# Patient Record
Sex: Male | Born: 1950 | Race: White | Hispanic: No | Marital: Single | Smoking: Former smoker
Health system: Southern US, Community
[De-identification: ages and names within clinical notes are randomized; demographics above are authoritative.]

## PROBLEM LIST (undated history)

## (undated) DIAGNOSIS — J449 Chronic obstructive pulmonary disease, unspecified: Secondary | ICD-10-CM

## (undated) DIAGNOSIS — M810 Age-related osteoporosis without current pathological fracture: Secondary | ICD-10-CM

## (undated) DIAGNOSIS — J9601 Acute respiratory failure with hypoxia: Secondary | ICD-10-CM

## (undated) DIAGNOSIS — J9602 Acute respiratory failure with hypercapnia: Secondary | ICD-10-CM

## (undated) DIAGNOSIS — J45909 Unspecified asthma, uncomplicated: Secondary | ICD-10-CM

## (undated) HISTORY — DX: Acute respiratory failure with hypercapnia: J96.02

## (undated) HISTORY — PX: BACK SURGERY: SHX140

## (undated) HISTORY — DX: Acute respiratory failure with hypoxia: J96.01

---

## 2004-05-27 DIAGNOSIS — F411 Generalized anxiety disorder: Secondary | ICD-10-CM

## 2004-05-27 DIAGNOSIS — J439 Emphysema, unspecified: Secondary | ICD-10-CM

## 2004-06-01 DIAGNOSIS — Z720 Tobacco use: Secondary | ICD-10-CM | POA: Insufficient documentation

## 2004-06-01 DIAGNOSIS — K210 Gastro-esophageal reflux disease with esophagitis: Secondary | ICD-10-CM

## 2005-01-20 DIAGNOSIS — F329 Major depressive disorder, single episode, unspecified: Secondary | ICD-10-CM | POA: Insufficient documentation

## 2008-01-15 ENCOUNTER — Other Ambulatory Visit: Payer: Self-pay

## 2008-01-15 ENCOUNTER — Inpatient Hospital Stay: Payer: Self-pay | Admitting: Internal Medicine

## 2008-02-14 ENCOUNTER — Ambulatory Visit: Payer: Self-pay | Admitting: Internal Medicine

## 2008-04-14 ENCOUNTER — Ambulatory Visit: Payer: Self-pay | Admitting: Unknown Physician Specialty

## 2008-04-28 ENCOUNTER — Emergency Department: Payer: Self-pay | Admitting: Emergency Medicine

## 2008-04-28 ENCOUNTER — Other Ambulatory Visit: Payer: Self-pay

## 2008-04-30 ENCOUNTER — Emergency Department: Payer: Self-pay | Admitting: Emergency Medicine

## 2008-07-06 ENCOUNTER — Other Ambulatory Visit: Payer: Self-pay

## 2008-07-06 ENCOUNTER — Inpatient Hospital Stay: Payer: Self-pay | Admitting: Specialist

## 2008-07-14 ENCOUNTER — Emergency Department: Payer: Self-pay | Admitting: Emergency Medicine

## 2008-08-04 IMAGING — CT CT CHEST W/ CM
1 of 2 series · 15 of 29 positions shown, 19 images · non-contrast
Comparison: none

REASON FOR EXAM: Shortness of breath, COPD
COMMENTS:

[Series 2: soft tissue · axial · 0.77mm/px · z∈[-360,-50]mm · 15 of 68 slices shown, 19 images]
[im 3/68  mediastinal]
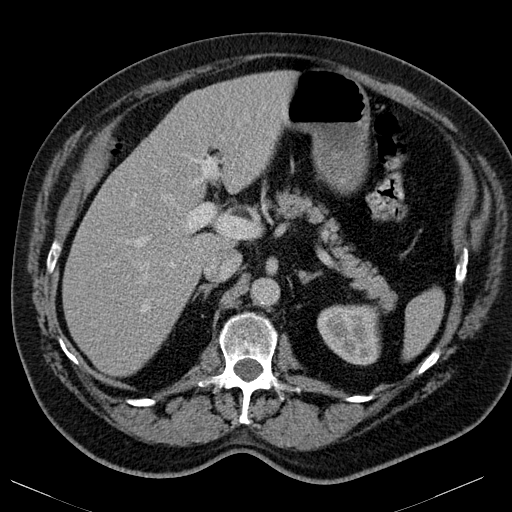
[im 3/68  lung]
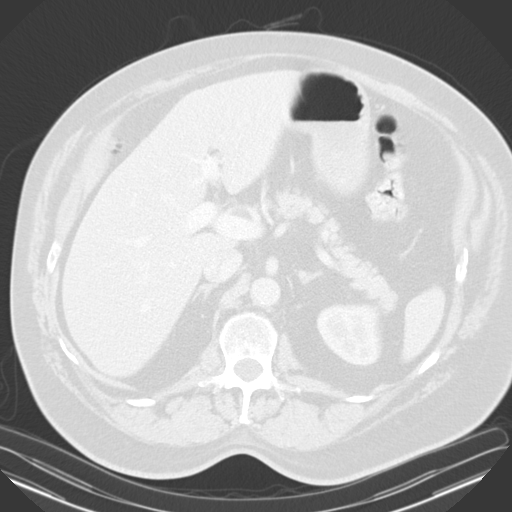
[im 9/68  lung]
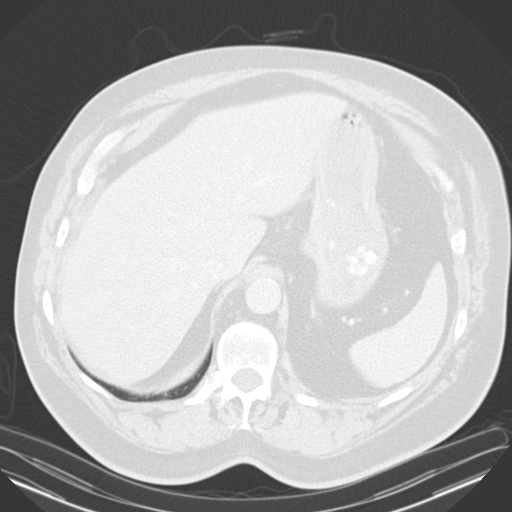
[im 14/68  lung]
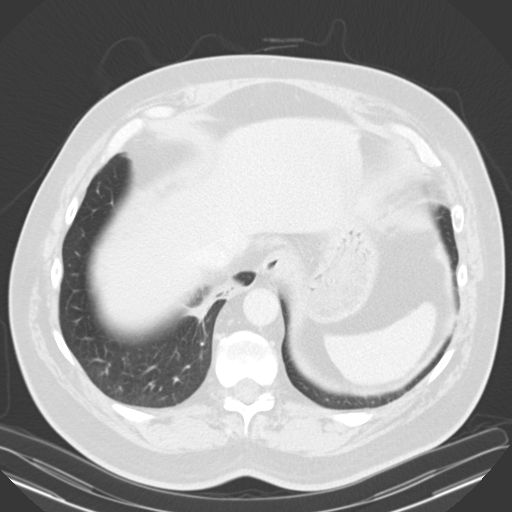
[im 17/68  lung]
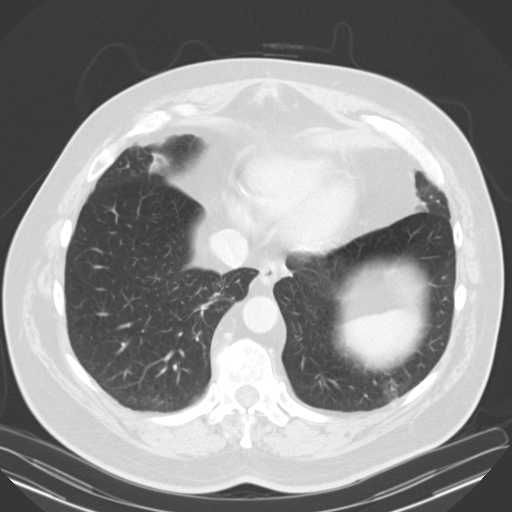
[im 23/68  mediastinal]
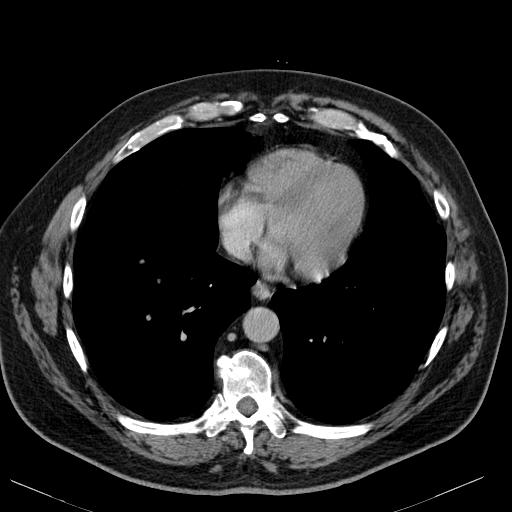
[im 23/68  lung]
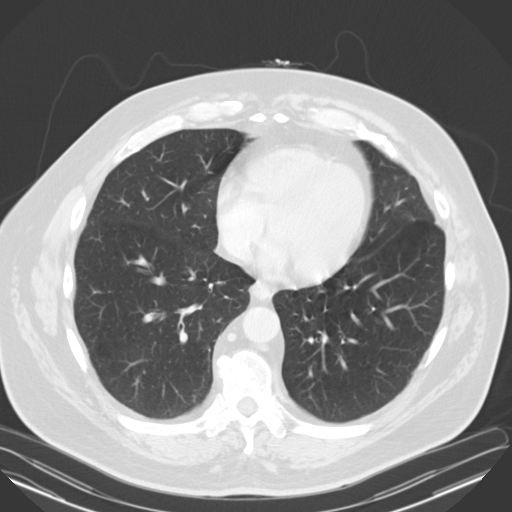
[im 26/68  lung]
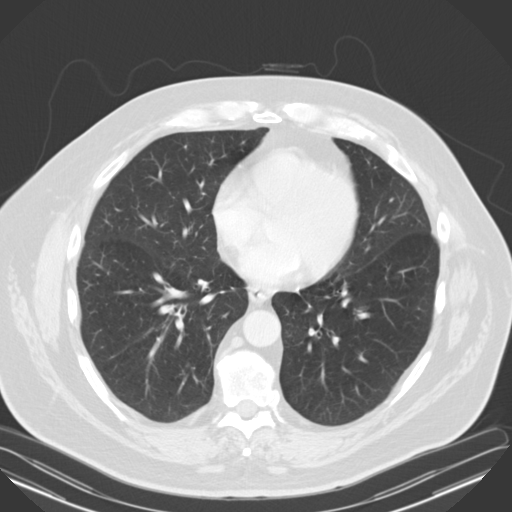
[im 30/68  lung]
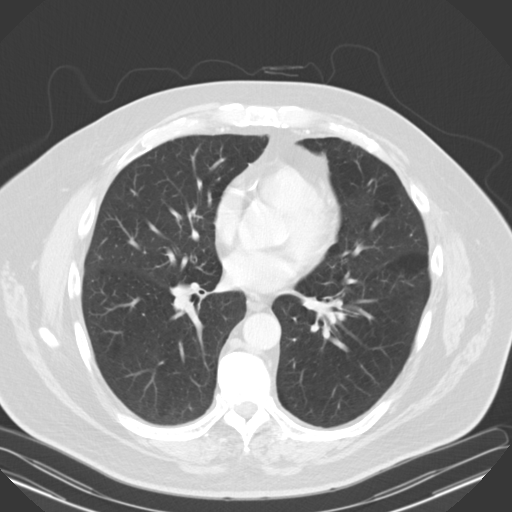
[im 34/68  lung]
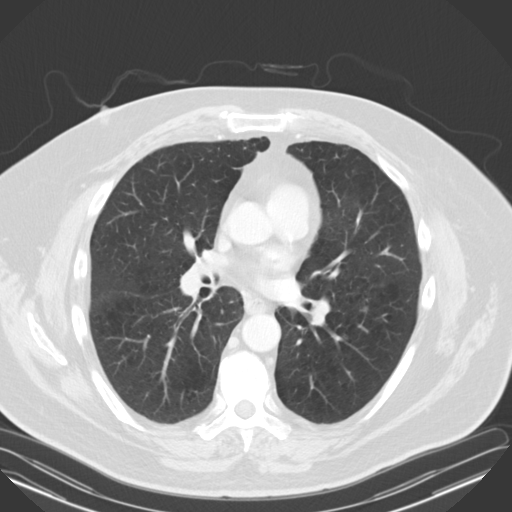
[im 37/68  mediastinal]
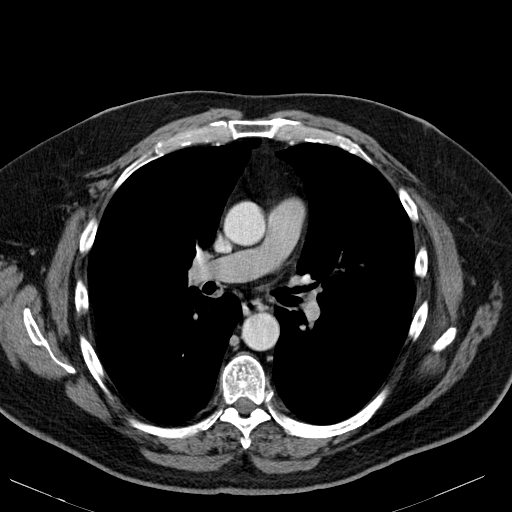
[im 37/68  lung]
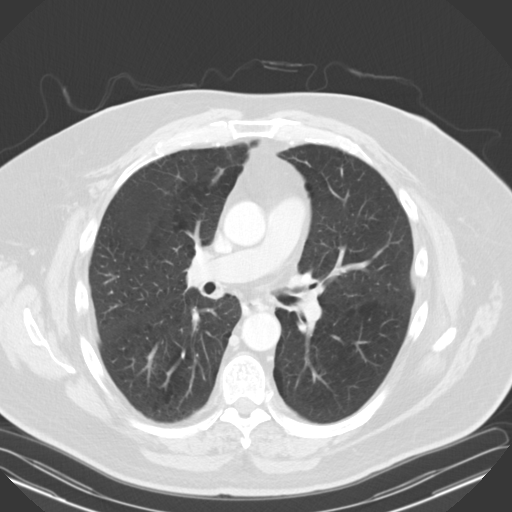
[im 42/68  lung]
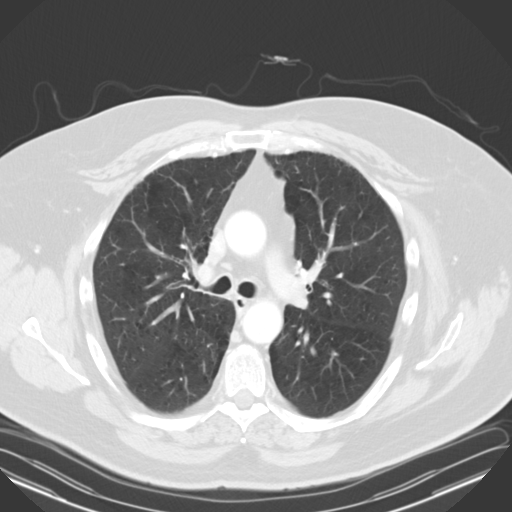
[im 45/68  lung]
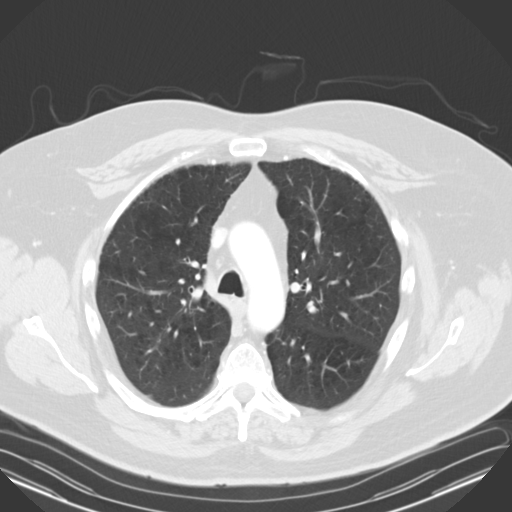
[im 51/68  lung]
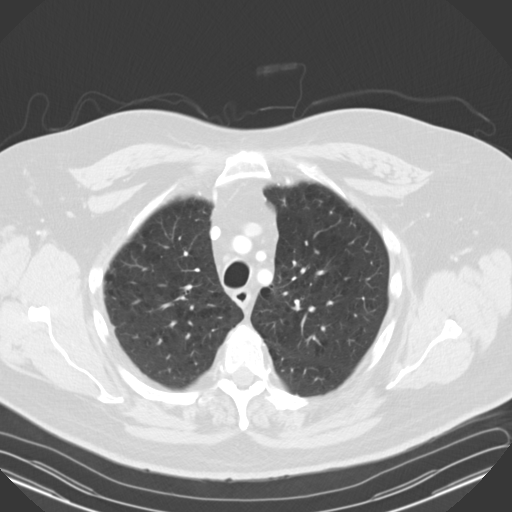
[im 56/68  mediastinal]
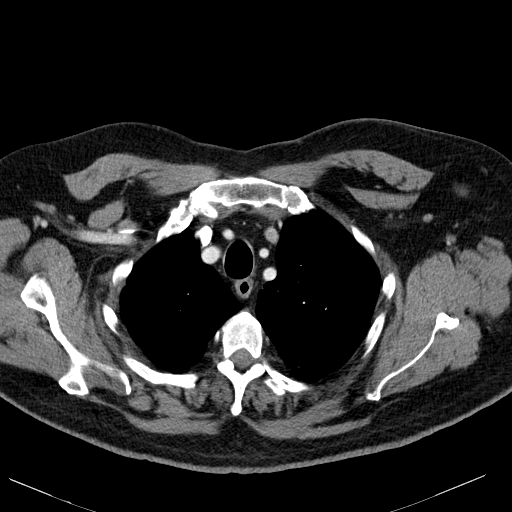
[im 56/68  lung]
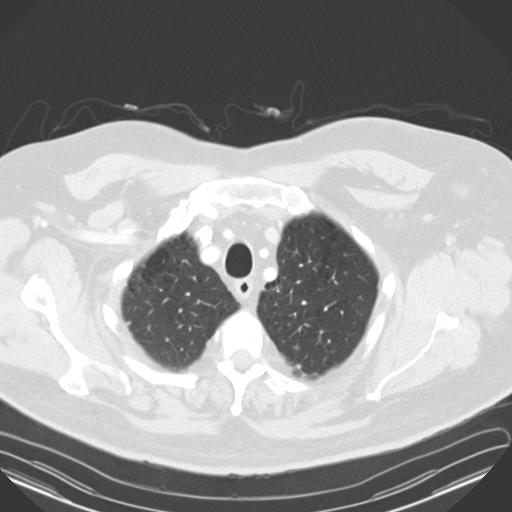
[im 59/68  lung]
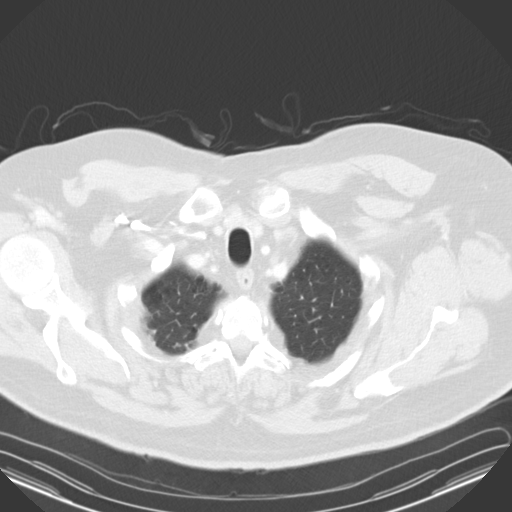
[im 65/68  lung]
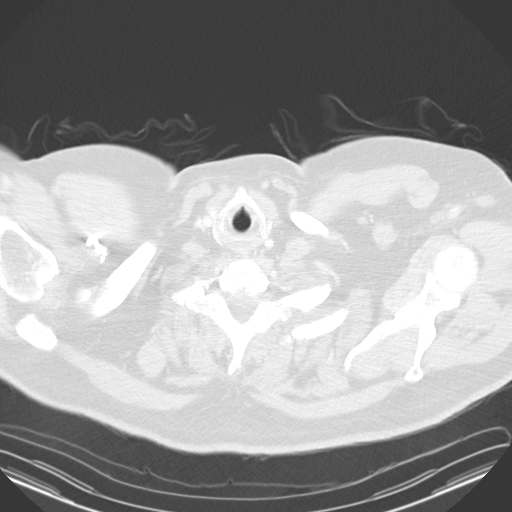

[15 of 29 positions shown; findings below may reference images not displayed]

PROCEDURE:     FI - FI CHEST WITH CONTRAST  - February 14, 2008  [DATE]

RESULT:     The patient is being evaluated for dyspnea. The patient has
known COPD.

The patient received 75 ml of [id number].

The cardiac chambers are normal in size. The caliber of the thoracic aorta
is normal. The study was not tailored for pulmonary arterial tree
evaluation. In the central pulmonary arteries no definite filling defect is
seen but the bolus is not ideal for evaluating the peripheral pulmonary
arteries. No pathologic sized mediastinal or hilar lymph nodes are evident.
There is no pleural or pericardial effusion.

At lung window settings there are emphysematous changes bilaterally. The
area of cavitary parenchymal density quite medially in the lower portion of
the RIGHT lower lobe in a paravertebral location has become much less
conspicuous consistent with ongoing resolution. There is a small amount of
fibrotic appearing change in this region now, demonstrated best on images
#52-56.   There are no abnormal nodules and I see no alveolar or
interstitial infiltrates. Within the upper abdomen, the observed portions of
the liver are normal. There are no adrenal masses.
IMPRESSION: 1.  There has been interval improvement in the appearance of the parenchymal
consolidation and cavitation very medially in the RIGHT lower lobe since the
study [DATE]. There are also findings of COPD which appear stable.
2.  There is no evidence of mediastinal or hilar lymphadenopathy.
3.  There is no evidence of CHF or pleural or pericardial effusion.

## 2008-10-05 ENCOUNTER — Inpatient Hospital Stay: Payer: Self-pay | Admitting: Internal Medicine

## 2008-11-10 ENCOUNTER — Ambulatory Visit: Payer: Self-pay | Admitting: Unknown Physician Specialty

## 2013-07-12 DIAGNOSIS — Z79899 Other long term (current) drug therapy: Secondary | ICD-10-CM | POA: Insufficient documentation

## 2013-07-12 DIAGNOSIS — Z9114 Patient's other noncompliance with medication regimen: Secondary | ICD-10-CM | POA: Insufficient documentation

## 2013-07-12 DIAGNOSIS — Z91148 Patient's other noncompliance with medication regimen for other reason: Secondary | ICD-10-CM | POA: Insufficient documentation

## 2013-08-20 ENCOUNTER — Emergency Department: Payer: Self-pay | Admitting: Emergency Medicine

## 2015-06-26 ENCOUNTER — Emergency Department
Admission: EM | Admit: 2015-06-26 | Discharge: 2015-06-26 | Disposition: A | Discharge status: Home / Self Care | Payer: Medicare Other | Attending: Emergency Medicine | Admitting: Emergency Medicine

## 2015-06-26 ENCOUNTER — Encounter: Payer: Self-pay | Admitting: Emergency Medicine

## 2015-06-26 ENCOUNTER — Emergency Department: Payer: Medicare Other

## 2015-06-26 DIAGNOSIS — E034 Atrophy of thyroid (acquired): Secondary | ICD-10-CM | POA: Insufficient documentation

## 2015-06-26 DIAGNOSIS — M62830 Muscle spasm of back: Secondary | ICD-10-CM | POA: Insufficient documentation

## 2015-06-26 DIAGNOSIS — Y9289 Other specified places as the place of occurrence of the external cause: Secondary | ICD-10-CM | POA: Diagnosis not present

## 2015-06-26 DIAGNOSIS — S24109A Unspecified injury at unspecified level of thoracic spinal cord, initial encounter: Secondary | ICD-10-CM | POA: Insufficient documentation

## 2015-06-26 DIAGNOSIS — G8929 Other chronic pain: Secondary | ICD-10-CM | POA: Diagnosis not present

## 2015-06-26 DIAGNOSIS — S3992XA Unspecified injury of lower back, initial encounter: Secondary | ICD-10-CM | POA: Insufficient documentation

## 2015-06-26 DIAGNOSIS — IMO0002 Reserved for concepts with insufficient information to code with codable children: Secondary | ICD-10-CM | POA: Insufficient documentation

## 2015-06-26 DIAGNOSIS — Z720 Tobacco use: Secondary | ICD-10-CM | POA: Diagnosis not present

## 2015-06-26 DIAGNOSIS — X58XXXA Exposure to other specified factors, initial encounter: Secondary | ICD-10-CM | POA: Diagnosis not present

## 2015-06-26 DIAGNOSIS — M549 Dorsalgia, unspecified: Secondary | ICD-10-CM

## 2015-06-26 DIAGNOSIS — M810 Age-related osteoporosis without current pathological fracture: Secondary | ICD-10-CM | POA: Insufficient documentation

## 2015-06-26 DIAGNOSIS — Y9389 Activity, other specified: Secondary | ICD-10-CM | POA: Diagnosis not present

## 2015-06-26 DIAGNOSIS — Y998 Other external cause status: Secondary | ICD-10-CM | POA: Diagnosis not present

## 2015-06-26 DIAGNOSIS — M62838 Other muscle spasm: Secondary | ICD-10-CM

## 2015-06-26 HISTORY — DX: Age-related osteoporosis without current pathological fracture: M81.0

## 2015-06-26 HISTORY — DX: Chronic obstructive pulmonary disease, unspecified: J44.9

## 2015-06-26 MED ORDER — OXYCODONE HCL 5 MG PO TABS: 5.0000 mg | ORAL_TABLET | Freq: Four times a day (QID) | ORAL | Status: DC | PRN

## 2015-06-26 MED ORDER — OXYCODONE-ACETAMINOPHEN 5-325 MG PO TABS
1.0000 | ORAL_TABLET | Freq: Once | ORAL | Status: AC
  Administered 2015-06-26: 1 via ORAL
  Filled 2015-06-26: qty 1

## 2015-06-26 NOTE — ED Notes (Signed)
Patient transported to X-ray 

## 2015-06-26 NOTE — ED Notes (Signed)
Patient with no complaints at this time. Respirations even and unlabored. Skin warm/dry. Discharge instructions reviewed with patient at this time. Patient given opportunity to voice concerns/ask questions. Patient discharged at this time and left Emergency Department, via wheelchair.   

## 2015-06-26 NOTE — ED Notes (Signed)
Pt complains of pain to left side of back. Pt states he hit his back on metal rod attached to his oxygen tank in back seat of his car.

## 2015-06-26 NOTE — Discharge Instructions (Signed)
Back Pain, Adult Low back pain is very common. About 1 in 5 people have back pain.The cause of low back pain is rarely dangerous. The pain often gets better over time.About half of people with a sudden onset of back pain feel better in just 2 weeks. About 8 in 10 people feel better by 6 weeks.  CAUSES Some common causes of back pain include:  Strain of the muscles or ligaments supporting the spine.  Wear and tear (degeneration) of the spinal discs.  Arthritis.  Direct injury to the back. DIAGNOSIS Most of the time, the direct cause of low back pain is not known.However, back pain can be treated effectively even when the exact cause of the pain is unknown.Answering your caregiver's questions about your overall health and symptoms is one of the most accurate ways to make sure the cause of your pain is not dangerous. If your caregiver needs more information, he or she may order lab work or imaging tests (X-rays or MRIs).However, even if imaging tests show changes in your back, this usually does not require surgery. HOME CARE INSTRUCTIONS For many people, back pain returns.Since low back pain is rarely dangerous, it is often a condition that people can learn to manageon their own.   Remain active. It is stressful on the back to sit or stand in one place. Do not sit, drive, or stand in one place for more than 30 minutes at a time. Take short walks on level surfaces as soon as pain allows.Try to increase the length of time you walk each day.  Do not stay in bed.Resting more than 1 or 2 days can delay your recovery.  Do not avoid exercise or work.Your body is made to move.It is not dangerous to be active, even though your back may hurt.Your back will likely heal faster if you return to being active before your pain is gone.  Pay attention to your body when you bend and lift. Many people have less discomfortwhen lifting if they bend their knees, keep the load close to their bodies,and  avoid twisting. Often, the most comfortable positions are those that put less stress on your recovering back.  Find a comfortable position to sleep. Use a firm mattress and lie on your side with your knees slightly bent. If you lie on your back, put a pillow under your knees.  Only take over-the-counter or prescription medicines as directed by your caregiver. Over-the-counter medicines to reduce pain and inflammation are often the most helpful.Your caregiver may prescribe muscle relaxant drugs.These medicines help dull your pain so you can more quickly return to your normal activities and healthy exercise.  Put ice on the injured area.  Put ice in a plastic bag.  Place a towel between your skin and the bag.  Leave the ice on for 15-20 minutes, 03-04 times a day for the first 2 to 3 days. After that, ice and heat may be alternated to reduce pain and spasms.  Ask your caregiver about trying back exercises and gentle massage. This may be of some benefit.  Avoid feeling anxious or stressed.Stress increases muscle tension and can worsen back pain.It is important to recognize when you are anxious or stressed and learn ways to manage it.Exercise is a great option. SEEK MEDICAL CARE IF:  You have pain that is not relieved with rest or medicine.  You have pain that does not improve in 1 week.  You have new symptoms.  You are generally not feeling well. SEEK   IMMEDIATE MEDICAL CARE IF:   You have pain that radiates from your back into your legs.  You develop new bowel or bladder control problems.  You have unusual weakness or numbness in your arms or legs.  You develop nausea or vomiting.  You develop abdominal pain.  You feel faint. Document Released: 09/19/2005 Document Revised: 03/20/2012 Document Reviewed: 01/21/2014 Exit[employer] Patient Information 2015 [employer]. This information is not intended to replace advice given to you by your health care provider. Make sure you  discuss any questions you have with your health care provider.  

## 2015-06-26 NOTE — ED Notes (Signed)
Pt states he was trying to fix the material that was falling from the ceiling of his car and he twisted his lower back and felt something "pop", states he has osteoporosis and has had multiple fx to vertebrae, pt ambulatory and drove his self to ED today

## 2015-06-26 NOTE — ED Provider Notes (Signed)
CSN: [id number]     Arrival date & time 06/26/15  1606 History   First MD Initiated Contact with Patient 06/26/15 1738     Chief Complaint  Patient presents with  . Back Pain     (Consider location/radiation/quality/duration/timing/severity/associated sxs/prior Treatment) HPI  64 year old male with history of chronic back pain resents to the emergency department for evaluation of acute on chronic back pain. Patient states 12:30 today, he was in his car working on the head liner when he twisted and felt pain in his left mid to lower back. He grabs a tight sharp pain along the thoracolumbar junction that radiates along the left ribs. He notes swelling and tenderness to palpation of the left lower back. He states he felt a pop with twisting. He is concerned of compression deformity, he has had numerous compression fractures with kyphoplasty procedures in the past. He denies any numbness tingling or radicular symptoms. He continues to be ambulatory with his walker. He takes Flexeril 10 mg 3 times a day. He does not currently have any other medications except for ibuprofen for pain. Patient does not tolerate ibuprofen due to gastric irritation. She with chronic and severe COPD. He states his resting heart rate is typically in the upper 90s.  Past Medical History  Diagnosis Date  . COPD (chronic obstructive pulmonary disease)   . Osteoporosis    Past Surgical History  Procedure Laterality Date  . Back surgery     No family history on file. Social History  Substance Use Topics  . Smoking status: Current Some Day Smoker  . Smokeless tobacco: None  . Alcohol Use: No    Review of Systems  Constitutional: Negative.  Negative for fever, chills, activity change and appetite change.  HENT: Negative for congestion, ear pain, mouth sores, rhinorrhea, sinus pressure, sore throat and trouble swallowing.   Eyes: Negative for photophobia, pain and discharge.  Respiratory: Negative for cough, chest  tightness and shortness of breath.   Cardiovascular: Negative for chest pain and leg swelling.  Gastrointestinal: Negative for nausea, vomiting, abdominal pain, diarrhea and abdominal distention.  Genitourinary: Negative for dysuria and difficulty urinating.  Musculoskeletal: Positive for back pain. Negative for arthralgias and gait problem.  Skin: Negative for color change and rash.  Neurological: Negative for dizziness and headaches.  Hematological: Negative for adenopathy.  Psychiatric/Behavioral: Negative for behavioral problems and agitation.      Allergies  Review of patient's allergies indicates no known allergies.  Home Medications   Prior to Admission medications   Medication Sig Start Date End Date Taking? Authorizing Provider  oxyCODONE (ROXICODONE) 5 MG immediate release tablet Take 1 tablet (5 mg total) by mouth every 6 (six) hours as needed. 06/26/15 06/25/16  Duanne Guess, PA-C   BP 137/81 mmHg  Pulse 113  Temp(Src) 98.1 F (36.7 C) (Oral)  Resp 18  Ht 5' (1.524 m)  Wt 176 lb (79.833 kg)  BMI 34.37 kg/m2  SpO2 93% Physical Exam  Constitutional: He is oriented to person, place, and time. He appears well-developed and well-nourished.  HENT:  Head: Normocephalic and atraumatic.  Eyes: Conjunctivae and EOM are normal. Pupils are equal, round, and reactive to light.  Neck: Normal range of motion. Neck supple.  Cardiovascular: Regular rhythm, normal heart sounds and intact distal pulses.   Pulmonary/Chest: Effort normal and breath sounds normal. No respiratory distress. He has no wheezes. He has no rales. He exhibits no tenderness.  Abdominal: Soft. Bowel sounds are normal. He exhibits no distension  and no mass. There is no tenderness. There is no rebound and no guarding.  Musculoskeletal:  Examination of the thoracic and lumbar spine shows patient has spinous process tenderness of the thoracolumbar junction. His left paravertebral muscle tenderness at the left  thoracolumbar junction. No muscle spasms noted. No warmth erythema or swelling. Normal active and passive range of motion of the hips knees and ankles. Sensation is intact throughout the lower extremities. Patient was to stand with walker.  Neurological: He is alert and oriented to person, place, and time.  Skin: Skin is warm and dry.  Psychiatric: He has a normal mood and affect. His behavior is normal. Judgment and thought content normal.  Nursing note and vitals reviewed.   ED Course  Procedures (including critical care time) Labs Review Labs Reviewed - No data to display  Imaging Review Dg Thoracic Spine 2 View  06/26/2015   CLINICAL DATA:  Pt states he was trying to fix the material that was falling from the ceiling of his car and he twisted his lower back and felt something "pop", states he has osteoporosis and has had multiple fx to vertebrae.  EXAM: THORACIC SPINE 2 VIEWS  COMPARISON:  08/20/2013  FINDINGS: Status post kyphoplasty T 11, T12, L1, L3, and L4. Degenerative changes are seen throughout the thoracic spine. There is superior endplate fracture of L3 which is chronic. No definite acute fractures are identified. Paravertebral line has a normal appearance.  IMPRESSION: 1. Multilevel kyphoplasty changes. 2. Stable appearance of superior endplate fracture at T3. 3.  No evidence for acute  abnormality.   Electronically Signed   By: Nolon Nations M.D.   On: 06/26/2015 18:57   Dg Lumbar Spine 2-3 Views  06/26/2015   CLINICAL DATA:  Twisting injury to lower back with subsequent pain, history of multiple vertebral augmentations.  EXAM: LUMBAR SPINE - 2-3 VIEW  COMPARISON:  08/20/2013  FINDINGS: There are changes consistent with prior vertebral augmentation at L4, L3, L1, T12 and T11. These are stable in appearance from the prior exam. A mild compression deformity is noted at L2 but appears relatively stable from the prior exam. This may be somewhat projectional in nature. Aortic  calcifications are seen. The visualized pelvic ring is intact.  IMPRESSION: Multiple prior vertebral augmentations. No definitive acute compression deformity is noted. The need for further evaluation by means of MRI as an outpatient can be determined on a clinical basis.   Electronically Signed   By: Inez Catalina M.D.   On: 06/26/2015 18:48   I have personally reviewed and evaluated these images and lab results as part of my medical decision-making.   EKG Interpretation None      MDM   Final diagnoses:  Back pain  Muscle spasm    65 year old male with acute on chronic lower back pain. X-ray showed no evidence of acute compression deformity. He is neurovascular intact in bilateral lower extremities. No neurological deficits. Patient was given prescription for pain, oxycodone 5 mg 1 tab by mouth every 6 hours as needed for pain. He will follow-up with orthopedics in 3-4 days. Return to the ER for any worsening symptoms urgent changes in health.    Duanne Guess, PA-C 06/26/15 [place], PA-C 06/26/15 [place], PA-C 06/26/15 1931  Wandra Arthurs, MD 06/26/15 [id number]

## 2015-06-29 ENCOUNTER — Emergency Department
Admission: EM | Admit: 2015-06-29 | Discharge: 2015-06-29 | Disposition: A | Discharge status: Home / Self Care | Payer: Medicare Other | Attending: Student | Admitting: Student

## 2015-06-29 ENCOUNTER — Encounter: Payer: Self-pay | Admitting: Emergency Medicine

## 2015-06-29 DIAGNOSIS — M545 Low back pain, unspecified: Secondary | ICD-10-CM

## 2015-06-29 DIAGNOSIS — Z720 Tobacco use: Secondary | ICD-10-CM | POA: Diagnosis not present

## 2015-06-29 DIAGNOSIS — S300XXD Contusion of lower back and pelvis, subsequent encounter: Secondary | ICD-10-CM | POA: Diagnosis not present

## 2015-06-29 DIAGNOSIS — R109 Unspecified abdominal pain: Secondary | ICD-10-CM | POA: Diagnosis not present

## 2015-06-29 DIAGNOSIS — G8929 Other chronic pain: Secondary | ICD-10-CM | POA: Diagnosis not present

## 2015-06-29 DIAGNOSIS — S3992XD Unspecified injury of lower back, subsequent encounter: Secondary | ICD-10-CM

## 2015-06-29 MED ORDER — ORPHENADRINE CITRATE ER 100 MG PO TB12: 100.0000 mg | ORAL_TABLET | Freq: Two times a day (BID) | ORAL | Status: DC | PRN

## 2015-06-29 MED ORDER — ORPHENADRINE CITRATE 30 MG/ML IJ SOLN
60.0000 mg | INTRAMUSCULAR | Status: AC
  Administered 2015-06-29: 60 mg via INTRAMUSCULAR
  Filled 2015-06-29: qty 2

## 2015-06-29 MED ORDER — KETOROLAC TROMETHAMINE 10 MG PO TABS: 10.0000 mg | ORAL_TABLET | Freq: Three times a day (TID) | ORAL | Status: DC

## 2015-06-29 MED ORDER — KETOROLAC TROMETHAMINE 60 MG/2ML IM SOLN
60.0000 mg | Freq: Once | INTRAMUSCULAR | Status: AC
  Administered 2015-06-29: 60 mg via INTRAMUSCULAR
  Filled 2015-06-29: qty 2

## 2015-06-29 MED ORDER — BENZONATATE 100 MG PO CAPS: 200.0000 mg | ORAL_CAPSULE | Freq: Once | ORAL | Status: DC

## 2015-06-29 NOTE — ED Notes (Signed)
Pt was here Friday for back pain and is not getting better. Hurt back doing car work. When distracted, does not c /o pain. Hx. of long term prednisone use. Uses o2 all the time. sats 82% on 2 liters

## 2015-06-29 NOTE — ED Provider Notes (Signed)
[facility] Emergency Department Provider Note ____________________________________________  Time seen: 53  I have reviewed the triage vital signs and the nursing notes.  HISTORY  Chief Complaint  Back Pain  HPI Presley Summerlin is a 64 y.o. male returns to the ED for evaluation of his left low back pain, from Friday. 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 He describes being in a twisted position when he leaned into his O2 compressor that sits behindhis right upper seat. He describes feeling and hearing a pop when he made contact with the O2 compressor. He was evaluated here with imaging to the thoracic and lumbar spine. There were no acute findings on either exam. He was discharged with oxycodone and a referral to orthopedics for follow-up care. Return to without any acute reinjury, in the interim. He now localizes pain to the left flank but denies any hematuria or dysuria.  Past Medical History  Diagnosis Date  . COPD (chronic obstructive pulmonary disease)   . Osteoporosis     There are no active problems to display for this patient.   Past Surgical History  Procedure Laterality Date  . Back surgery      Current Outpatient Rx  Name  Route  Sig  Dispense  Refill  . ketorolac (TORADOL) 10 MG tablet   Oral   Take 1 tablet (10 mg total) by mouth every 8 (eight) hours.   15 tablet   0   . orphenadrine (NORFLEX) 100 MG tablet   Oral   Take 1 tablet (100 mg total) by mouth 2 (two) times daily as needed for muscle spasms.   20 tablet   0   . oxyCODONE (ROXICODONE) 5 MG immediate release tablet   Oral   Take 1 tablet (5 mg total) by mouth every 6 (six) hours as needed.   20 tablet   0    Allergies Review of patient's allergies indicates no known allergies.  History reviewed. No pertinent family history.  Social History Social History  Substance Use Topics  . Smoking status:  Current Some Day Smoker  . Smokeless tobacco: None  . Alcohol Use: No   Review of Systems  Constitutional: Negative for fever. Eyes: Negative for visual changes. ENT: Negative for sore throat. Cardiovascular: Negative for chest pain. Respiratory: Negative for shortness of breath. Gastrointestinal: Negative for abdominal pain, vomiting and diarrhea. Genitourinary: Negative for dysuria. Musculoskeletal: Positive for back pain. Left flank pain as above. Skin: Negative for rash. Neurological: Negative for headaches, focal weakness or numbness. ____________________________________________  PHYSICAL EXAM:  VITAL SIGNS: ED Triage Vitals  Enc Vitals Group     BP 06/29/15 1928 135/89 mmHg     Pulse Rate 06/29/15 1928 112     Resp --      Temp 06/29/15 1928 98.5 F (36.9 C)     Temp Source 06/29/15 1928 Oral     SpO2 06/29/15 1928 82 %     Weight --      Height --      Head Cir --      Peak Flow --      Pain Score 06/29/15 1936 7     Pain Loc --      Pain Edu? --      Excl. in [place]? --    Constitutional: Alert and oriented. Well appearing and in no distress. Eyes: Conjunctivae are normal. PERRL. Normal extraocular movements. ENT  Head: Normocephalic and atraumatic.   Nose: No congestion/rhinorrhea.   Mouth/Throat: Mucous membranes are moist.   Neck: Supple. No thyromegaly. Hematological/Lymphatic/Immunological: No cervical lymphadenopathy. Cardiovascular: Normal rate, regular rhythm.  Respiratory: Normal respiratory effort. No wheezes/rales/rhonchi. Gastrointestinal: Soft and nontender. No distention. Musculoskeletal: Normal spinal alignment with mild kyphotic changes. No midline tenderness, deformity, or step-off. There is no abrasion, bruise, ecchymosis or erythema noted to the lumbar spine or the flank region. Patient without right flank tenderness on palpation. Nontender with normal range of motion in all extremities.  Neurologic:  Normal gait without ataxia.  Normal speech and language. No gross focal neurologic deficits are appreciated. Skin:  Skin is warm, dry and intact. No rash noted. Psychiatric: Mood and affect are normal. Patient exhibits appropriate insight and judgment. ____________________________________________  PROCEDURES  Toradol 60 mg IM Norflex 60 mg IM ____________________________________________  INITIAL IMPRESSION / ASSESSMENT AND PLAN / ED COURSE  Treatment for acute on chronic low back pain after a minor lumbar contusion 2 days prior. Patient returns to the ED and is advised to follow-up with his primary care provider or Dr. Rudene Christians as previously referred. He is discharged with Norflex and Toradol  to dose for acute pain. He will continue to dose the previously prescribed oxycodone as directed. ____________________________________________  FINAL CLINICAL IMPRESSION(S) / ED DIAGNOSES  Final diagnoses:  Chronic low back pain  Contusion of lower back, subsequent encounter  Lower back injury, subsequent encounter      Melvenia Needles, PA-C 06/29/15 2119  Daymon Larsen, MD 06/29/15 2330

## 2015-06-29 NOTE — Discharge Instructions (Signed)
Back Pain, Adult Low back pain is very common. About 1 in 5 people have back pain.The cause of low back pain is rarely dangerous. The pain often gets better over time.About half of people with a sudden onset of back pain feel better in just 2 weeks. About 8 in 10 people feel better by 6 weeks.  CAUSES Some common causes of back pain include:  Strain of the muscles or ligaments supporting the spine.  Wear and tear (degeneration) of the spinal discs.  Arthritis.  Direct injury to the back. DIAGNOSIS Most of the time, the direct cause of low back pain is not known.However, back pain can be treated effectively even when the exact cause of the pain is unknown.Answering your caregiver's questions about your overall health and symptoms is one of the most accurate ways to make sure the cause of your pain is not dangerous. If your caregiver needs more information, he or she may order lab work or imaging tests (X-rays or MRIs).However, even if imaging tests show changes in your back, this usually does not require surgery. HOME CARE INSTRUCTIONS For many people, back pain returns.Since low back pain is rarely dangerous, it is often a condition that people can learn to [facility] their own.   Remain active. It is stressful on the back to sit or stand in one place. Do not sit, drive, or stand in one place for more than 30 minutes at a time. Take short walks on level surfaces as soon as pain allows.Try to increase the length of time you walk each day.  Do not stay in bed.Resting more than 1 or 2 days can delay your recovery.  Do not avoid exercise or work.Your body is made to move.It is not dangerous to be active, even though your back may hurt.Your back will likely heal faster if you return to being active before your pain is gone.  Pay attention to your body when you bend and lift. Many people have less discomfortwhen lifting if they bend their knees, keep the load close to their bodies,and  avoid twisting. Often, the most comfortable positions are those that put less stress on your recovering back.  Find a comfortable position to sleep. Use a firm mattress and lie on your side with your knees slightly bent. If you lie on your back, put a pillow under your knees.  Only take over-the-counter or prescription medicines as directed by your caregiver. Over-the-counter medicines to reduce pain and inflammation are often the most helpful.Your caregiver may prescribe muscle relaxant drugs.These medicines help dull your pain so you can more quickly return to your normal activities and healthy exercise.  Put ice on the injured area.  Put ice in a plastic bag.  Place a towel between your skin and the bag.  Leave the ice on for 15-20 minutes, 03-04 times a day for the first 2 to 3 days. After that, ice and heat may be alternated to reduce pain and spasms.  Ask your caregiver about trying back exercises and gentle massage. This may be of some benefit.  Avoid feeling anxious or stressed.Stress increases muscle tension and can worsen back pain.It is important to recognize when you are anxious or stressed and learn ways to manage it.Exercise is a great option. SEEK MEDICAL CARE IF:  You have pain that is not relieved with rest or medicine.  You have pain that does not improve in 1 week.  You have new symptoms.  You are generally not feeling well. SEEK  IMMEDIATE MEDICAL CARE IF:   You have pain that radiates from your back into your legs.  You develop new bowel or bladder control problems.  You have unusual weakness or numbness in your arms or legs.  You develop nausea or vomiting.  You develop abdominal pain.  You feel faint. Document Released: 09/19/2005 Document Revised: 03/20/2012 Document Reviewed: 01/21/2014 [facility] Patient Information 2015 [place], [place]. This information is not intended to replace advice given to you by your health care provider. Make sure you  discuss any questions you have with your health care provider.  Chronic Back Pain  When back pain lasts longer than 3 months, it is called chronic back pain.People with chronic back pain often go through certain periods that are more intense (flare-ups).  CAUSES Chronic back pain can be caused by wear and tear (degeneration) on different structures in your back. These structures include:  The bones of your spine (vertebrae) and the joints surrounding your spinal cord and nerve roots (facets).  The strong, fibrous tissues that connect your vertebrae (ligaments). Degeneration of these structures may result in pressure on your nerves. This can lead to constant pain. HOME CARE INSTRUCTIONS  Avoid bending, heavy lifting, prolonged sitting, and activities which make the problem worse.  Take brief periods of rest throughout the day to reduce your pain. Lying down or standing usually is better than sitting while you are resting.  Take over-the-counter or prescription medicines only as directed by your caregiver. SEEK IMMEDIATE MEDICAL CARE IF:   You have weakness or numbness in one of your legs or feet.  You have trouble controlling your bladder or bowels.  You have nausea, vomiting, abdominal pain, shortness of breath, or fainting. Document Released: 10/27/2004 Document Revised: 12/12/2011 Document Reviewed: 09/03/2011 [facility] Patient Information 2015 [place], [place]. This information is not intended to replace advice given to you by your health care provider. Make sure you discuss any questions you have with your health care provider.  Contusion A contusion is a deep bruise. Contusions happen when an injury causes bleeding under the skin. Signs of bruising include pain, puffiness (swelling), and discolored skin. The contusion may turn blue, purple, or yellow. HOME CARE   Put ice on the injured area.  Put ice in a plastic bag.  Place a towel between your skin and the bag.  Leave the  ice on for 15-20 minutes, 03-04 times a day.  Only take medicine as told by your doctor.  Rest the injured area.  If possible, raise (elevate) the injured area to lessen puffiness. GET HELP RIGHT AWAY IF:   You have more bruising or puffiness.  You have pain that is getting worse.  Your puffiness or pain is not helped by medicine. MAKE SURE YOU:   Understand these instructions.  Will watch your condition.  Will get help right away if you are not doing well or get worse. Document Released: 03/07/2008 Document Revised: 12/12/2011 Document Reviewed: 07/25/2011 [facility] Patient Information 2015 [place], [place]. This information is not intended to replace advice given to you by your health care provider. Make sure you discuss any questions you have with your health care provider.  Take the prescription meds as directed. Continue the previously prescribed Oxycodone. Apply ice to the back for pain relief. Follow-up with your primary care provider or Dr. Rudene Christians for ongoing evaluation of pain.

## 2015-07-01 ENCOUNTER — Other Ambulatory Visit: Payer: Self-pay | Admitting: Unknown Physician Specialty

## 2015-07-01 DIAGNOSIS — R52 Pain, unspecified: Secondary | ICD-10-CM

## 2015-07-01 DIAGNOSIS — M546 Pain in thoracic spine: Secondary | ICD-10-CM

## 2015-07-02 ENCOUNTER — Ambulatory Visit
Admission: RE | Admit: 2015-07-02 | Discharge: 2015-07-02 | Disposition: A | Discharge status: Home / Self Care | Payer: Medicare Other | Source: Ambulatory Visit | Attending: Unknown Physician Specialty | Admitting: Unknown Physician Specialty

## 2015-07-02 ENCOUNTER — Ambulatory Visit: Payer: Medicare Other

## 2015-07-02 DIAGNOSIS — M546 Pain in thoracic spine: Secondary | ICD-10-CM | POA: Insufficient documentation

## 2015-07-02 DIAGNOSIS — R52 Pain, unspecified: Secondary | ICD-10-CM

## 2015-07-02 DIAGNOSIS — M5124 Other intervertebral disc displacement, thoracic region: Secondary | ICD-10-CM | POA: Insufficient documentation

## 2015-07-02 DIAGNOSIS — X58XXXA Exposure to other specified factors, initial encounter: Secondary | ICD-10-CM | POA: Diagnosis not present

## 2015-07-02 DIAGNOSIS — M4856XA Collapsed vertebra, not elsewhere classified, lumbar region, initial encounter for fracture: Secondary | ICD-10-CM | POA: Diagnosis not present

## 2015-07-28 ENCOUNTER — Other Ambulatory Visit: Payer: Self-pay | Admitting: Unknown Physician Specialty

## 2015-07-28 DIAGNOSIS — G8929 Other chronic pain: Secondary | ICD-10-CM

## 2015-07-28 DIAGNOSIS — M545 Low back pain, unspecified: Secondary | ICD-10-CM

## 2015-08-07 ENCOUNTER — Ambulatory Visit: Payer: Medicare Other

## 2015-08-21 ENCOUNTER — Ambulatory Visit: Admission: RE | Admit: 2015-08-21 | Payer: Medicare Other | Source: Ambulatory Visit

## 2015-11-27 ENCOUNTER — Encounter: Payer: Self-pay | Admitting: *Deleted

## 2015-11-27 ENCOUNTER — Emergency Department
Admission: EM | Admit: 2015-11-27 | Discharge: 2015-11-27 | Disposition: A | Discharge status: Home / Self Care | Payer: Medicare Other | Attending: Emergency Medicine | Admitting: Emergency Medicine

## 2015-11-27 DIAGNOSIS — M549 Dorsalgia, unspecified: Secondary | ICD-10-CM | POA: Insufficient documentation

## 2015-11-27 DIAGNOSIS — F17200 Nicotine dependence, unspecified, uncomplicated: Secondary | ICD-10-CM | POA: Diagnosis not present

## 2015-11-27 DIAGNOSIS — G8929 Other chronic pain: Secondary | ICD-10-CM | POA: Insufficient documentation

## 2015-11-27 DIAGNOSIS — M545 Low back pain: Secondary | ICD-10-CM | POA: Diagnosis present

## 2015-11-27 DIAGNOSIS — Z79899 Other long term (current) drug therapy: Secondary | ICD-10-CM | POA: Insufficient documentation

## 2015-11-27 MED ORDER — OXYCODONE HCL 10 MG PO TABS: 10.0000 mg | ORAL_TABLET | Freq: Four times a day (QID) | ORAL | Status: DC | PRN

## 2015-11-27 NOTE — ED Provider Notes (Signed)
CSN: [id number]     Arrival date & time 11/27/15  1837 History   First MD Initiated Contact with Patient 11/27/15 1950     Chief Complaint  Patient presents with  . Back Pain     (Consider location/radiation/quality/duration/timing/severity/associated sxs/prior Treatment) HPI  65 year old male with history of chronic back pain and COPD presents to the emergency department for evaluation of chronic back pain. Patient was on his way to orthopedist office earlier today for pain medication prescription. He takes oxycodone 10 mg every 6 hours as needed for pain. Unfortunately, patient had a flat tire, was unable to make his appointment. He is out of pain medications and having moderate to severe pain. Patient states this is his chronic lower back pain. He's had recent MRI of the thoracic spine showing compression fractures and degenerative disc changes. He is being evaluated by orthopedics. He denies any numbness tingling or radicular symptoms. No loss of bowel or bladder symptoms. No new trauma or injury. He has COPD and is currently on oxygen, denies any worsening respiratory issues. Patient is ambulatory.  Past Medical History  Diagnosis Date  . COPD (chronic obstructive pulmonary disease) ([place])   . Osteoporosis    Past Surgical History  Procedure Laterality Date  . Back surgery     History reviewed. No pertinent family history. Social History  Substance Use Topics  . Smoking status: Current Some Day Smoker  . Smokeless tobacco: None  . Alcohol Use: No    Review of Systems  Constitutional: Negative.  Negative for fever, chills, activity change and appetite change.  HENT: Negative for congestion, ear pain, mouth sores, rhinorrhea, sinus pressure, sore throat and trouble swallowing.   Eyes: Negative for photophobia, pain and discharge.  Respiratory: Negative for cough, chest tightness and shortness of breath.   Cardiovascular: Negative for chest pain and leg swelling.   Gastrointestinal: Negative for nausea, vomiting, abdominal pain, diarrhea and abdominal distention.  Genitourinary: Negative for dysuria and difficulty urinating.  Musculoskeletal: Positive for back pain (ormal chronic back pain midline thoracolumbar spine). Negative for arthralgias and gait problem.  Skin: Negative for color change and rash.  Neurological: Negative for dizziness and headaches.  Hematological: Negative for adenopathy.  Psychiatric/Behavioral: Negative for behavioral problems and agitation.      Allergies  Review of patient's allergies indicates no known allergies.  Home Medications   Prior to Admission medications   Medication Sig Start Date End Date Taking? Authorizing Provider  ketorolac (TORADOL) 10 MG tablet Take 1 tablet (10 mg total) by mouth every 8 (eight) hours. 06/29/15   Jenise V Bacon Menshew, PA-C  orphenadrine (NORFLEX) 100 MG tablet Take 1 tablet (100 mg total) by mouth 2 (two) times daily as needed for muscle spasms. 06/29/15   Jenise V Bacon Menshew, PA-C  oxyCODONE (ROXICODONE) 5 MG immediate release tablet Take 1 tablet (5 mg total) by mouth every 6 (six) hours as needed. 06/26/15 06/25/16  Duanne Guess, PA-C  Oxycodone HCl 10 MG TABS Take 1 tablet (10 mg total) by mouth every 6 (six) hours as needed for severe pain. 11/27/15 11/26/16  Duanne Guess, PA-C   BP 132/75 mmHg  Pulse 108  Temp(Src) 98.6 F (37 C) (Oral)  Resp 18  Ht 5\' 8"  (1.727 m)  Wt 77.111 kg  BMI 25.85 kg/m2  SpO2 92% Physical Exam  Constitutional: He is oriented to person, place, and time. He appears well-developed and well-nourished.  HENT:  Head: Normocephalic and atraumatic.  Eyes: Conjunctivae and  EOM are normal. Pupils are equal, round, and reactive to light.  Neck: Normal range of motion. Neck supple.  Cardiovascular: Normal rate, regular rhythm, normal heart sounds and intact distal pulses.   Pulmonary/Chest: Effort normal and breath sounds normal. No respiratory  distress. He has no wheezes. He has no rales. He exhibits no tenderness.  Slight decreased air movement bilaterally  Abdominal: Soft. Bowel sounds are normal. He exhibits no distension and no mass. There is no tenderness. There is no rebound.  Musculoskeletal:  Lumbar Spine: Examination of the lumbar spine reveals no bony abnormality, no edema, and no ecchymosis.  There is no step off.  The patient has decreased range of motion of the lumbar spine with flexion and extension.  The patient has decreased lateral bend and rotation.  The patient has mild pain with range of motion activities.  The patient has a negative axial load test, and a negative rotational Waddell test.  The patient is non tender along the spinous process.  The patient is non tender along the paravertebral muscles, with no muscle spasms.  The patient is non tender along the iliac crest.  The patient is non tender in the sciatic notch.  The patient is non tender along the Sacroiliac joint.  There is no Coccyx joint tenderness.    Bilateral Lower Extremities: Examination of the lower extremities reveals no bony abnormality, no edema, and no ecchymosis.  The patient has full active and passive range of motion of the hips, knees, and ankles.  There is no discomfort with range of motion exercises.  The patient is non tender along the greater trochanter region.  The patient has a negative Bevelyn Buckles' test bilaterally.  There is normal skin warmth.  There is normal capillary refill bilaterally.    Neurologic: The patient has a negative straight leg raise.  The patient has normal muscle strength testing for the quadriceps, calves, ankle dorsiflexion, ankle plantarflexion, and extensor hallicus longus.  The patient has sensation that is intact to light touch.  The deep tendon reflexes are nor  Neurological: He is alert and oriented to person, place, and time.  Skin: Skin is warm and dry.  Psychiatric: He has a normal mood and affect. His behavior  is normal. Judgment and thought content normal.    ED Course  Procedures (including critical care time) Labs Review Labs Reviewed - No data to display  Imaging Review No results found. I have personally reviewed and evaluated these images and lab results as part of my medical decision-making.   EKG Interpretation None      MDM   Final diagnoses:  Chronic back pain    65 year old male with chronic back pain, no acute trauma or injury. Complains of his normal chronic back pain today. Patient was unable to pick up prescription refilled today. After checking [facility] drug database, patient is due prescription. Patient is given a few days supply of oxycodone to last until he can pick up his prescription Monday. Patient educated on not using the emergency department for medication refills and chronic pain.  Duanne Guess, PA-C 11/27/15 2112  Carrie Mew, MD 11/27/15 [phone]

## 2015-11-27 NOTE — Discharge Instructions (Signed)
Chronic Back Pain  When back pain lasts longer than 3 months, it is called chronic back pain.People with chronic back pain often go through certain periods that are more intense (flare-ups).  CAUSES Chronic back pain can be caused by wear and tear (degeneration) on different structures in your back. These structures include:  The bones of your spine (vertebrae) and the joints surrounding your spinal cord and nerve roots (facets).  The strong, fibrous tissues that connect your vertebrae (ligaments). Degeneration of these structures may result in pressure on your nerves. This can lead to constant pain. HOME CARE INSTRUCTIONS  Avoid bending, heavy lifting, prolonged sitting, and activities which make the problem worse.  Take brief periods of rest throughout the day to reduce your pain. Lying down or standing usually is better than sitting while you are resting.  Take over-the-counter or prescription medicines only as directed by your caregiver. SEEK IMMEDIATE MEDICAL CARE IF:   You have weakness or numbness in one of your legs or feet.  You have trouble controlling your bladder or bowels.  You have nausea, vomiting, abdominal pain, shortness of breath, or fainting.   This information is not intended to replace advice given to you by your health care provider. Make sure you discuss any questions you have with your health care provider.   Document Released: 10/27/2004 Document Revised: 12/12/2011 Document Reviewed: 03/09/2015 Elsevier Interactive Patient Education 2016 [employer]  Back Pain, Adult Back pain is very common in adults.The cause of back pain is rarely dangerous and the pain often gets better over time.The cause of your back pain may not be known. Some common causes of back pain include:  Strain of the muscles or ligaments supporting the spine.  Wear and tear (degeneration) of the spinal disks.  Arthritis.  Direct injury to the back. For many people, back pain may  return. Since back pain is rarely dangerous, most people can learn to manage this condition on their own. HOME CARE INSTRUCTIONS Watch your back pain for any changes. The following actions may help to lessen any discomfort you are feeling:  Remain active. It is stressful on your back to sit or stand in one place for long periods of time. Do not sit, drive, or stand in one place for more than 30 minutes at a time. Take short walks on even surfaces as soon as you are able.Try to increase the length of time you walk each day.  Exercise regularly as directed by your health care provider. Exercise helps your back heal faster. It also helps avoid future injury by keeping your muscles strong and flexible.  Do not stay in bed.Resting more than 1-2 days can delay your recovery.  Pay attention to your body when you bend and lift. The most comfortable positions are those that put less stress on your recovering back. Always use proper lifting techniques, including:  Bending your knees.  Keeping the load close to your body.  Avoiding twisting.  Find a comfortable position to sleep. Use a firm mattress and lie on your side with your knees slightly bent. If you lie on your back, put a pillow under your knees.  Avoid feeling anxious or stressed.Stress increases muscle tension and can worsen back pain.It is important to recognize when you are anxious or stressed and learn ways to manage it, such as with exercise.  Take medicines only as directed by your health care provider. Over-the-counter medicines to reduce pain and inflammation are often the most helpful.Your health care  provider may prescribe muscle relaxant drugs.These medicines help dull your pain so you can more quickly return to your normal activities and healthy exercise.  Apply ice to the injured area:  Put ice in a plastic bag.  Place a towel between your skin and the bag.  Leave the ice on for 20 minutes, 2-3 times a day for the  first 2-3 days. After that, ice and heat may be alternated to reduce pain and spasms.  Maintain a healthy weight. Excess weight puts extra stress on your back and makes it difficult to maintain good posture. SEEK MEDICAL CARE IF:  You have pain that is not relieved with rest or medicine.  You have increasing pain going down into the legs or buttocks.  You have pain that does not improve in one week.  You have night pain.  You lose weight.  You have a fever or chills. SEEK IMMEDIATE MEDICAL CARE IF:   You develop new bowel or bladder control problems.  You have unusual weakness or numbness in your arms or legs.  You develop nausea or vomiting.  You develop abdominal pain.  You feel faint.   This information is not intended to replace advice given to you by your health care provider. Make sure you discuss any questions you have with your health care provider.   Document Released: 09/19/2005 Document Revised: 10/10/2014 Document Reviewed: 01/21/2014 Elsevier Interactive Patient Education [employer].

## 2015-11-27 NOTE — ED Notes (Signed)
States long hx of chronic back pain, states in September he had a injury again and today has a flare up of pain, pt on home o2 with hx of COPD

## 2016-02-19 ENCOUNTER — Telehealth: Payer: Self-pay | Admitting: *Deleted

## 2016-02-19 NOTE — Telephone Encounter (Signed)
sw pt informed him that his appt for 02/26/16@ 11am has been cancelled due to Dr. Idelia Salm has placed his new pts on hold...td

## 2016-02-26 ENCOUNTER — Ambulatory Visit: Payer: Medicare Other | Admitting: Anesthesiology

## 2016-03-10 ENCOUNTER — Encounter: Payer: Self-pay | Admitting: *Deleted

## 2016-03-14 ENCOUNTER — Encounter: Payer: Self-pay | Admitting: Pain Medicine

## 2016-03-14 DIAGNOSIS — Z79899 Other long term (current) drug therapy: Secondary | ICD-10-CM | POA: Insufficient documentation

## 2016-03-14 DIAGNOSIS — F1291 Cannabis use, unspecified, in remission: Secondary | ICD-10-CM | POA: Insufficient documentation

## 2016-03-14 DIAGNOSIS — Z79891 Long term (current) use of opiate analgesic: Secondary | ICD-10-CM | POA: Insufficient documentation

## 2016-03-14 DIAGNOSIS — F1190 Opioid use, unspecified, uncomplicated: Secondary | ICD-10-CM | POA: Insufficient documentation

## 2016-03-14 DIAGNOSIS — Z87898 Personal history of other specified conditions: Secondary | ICD-10-CM | POA: Insufficient documentation

## 2016-03-15 ENCOUNTER — Ambulatory Visit: Payer: Medicare Other | Admitting: Pain Medicine

## 2016-03-23 ENCOUNTER — Ambulatory Visit: Payer: Medicare Other | Admitting: Pain Medicine

## 2016-03-24 ENCOUNTER — Ambulatory Visit: Payer: Self-pay | Admitting: General Surgery

## 2016-04-06 ENCOUNTER — Encounter: Payer: Self-pay | Admitting: *Deleted

## 2016-04-20 ENCOUNTER — Ambulatory Visit: Payer: Medicare Other | Admitting: General Surgery

## 2016-04-21 ENCOUNTER — Telehealth: Payer: Self-pay | Admitting: General Surgery

## 2016-04-21 ENCOUNTER — Ambulatory Visit: Payer: Medicare Other | Admitting: Pain Medicine

## 2016-04-21 NOTE — Telephone Encounter (Signed)
04-21-16 JESSICA L/M FOR PT TO CALL BACK TO RE-SCH APPT WITH DR BYRNETT FROM 04-20-16.Anselmo TO BE SEEN SOON/MTH

## 2016-04-22 ENCOUNTER — Encounter: Payer: Self-pay | Admitting: *Deleted

## 2016-04-26 ENCOUNTER — Telehealth: Payer: Self-pay | Admitting: *Deleted

## 2016-04-26 NOTE — Telephone Encounter (Signed)
Patient rescheduled appointment from 04/20/16

## 2016-05-01 ENCOUNTER — Emergency Department: Payer: Medicare Other

## 2016-05-01 ENCOUNTER — Inpatient Hospital Stay
Admission: EM | Admit: 2016-05-01 | Discharge: 2016-05-03 | DRG: 190 | Disposition: A | Discharge status: Home / Self Care | Payer: Medicare Other | Attending: Internal Medicine | Admitting: Internal Medicine

## 2016-05-01 DIAGNOSIS — G629 Polyneuropathy, unspecified: Secondary | ICD-10-CM | POA: Diagnosis present

## 2016-05-01 DIAGNOSIS — J9601 Acute respiratory failure with hypoxia: Secondary | ICD-10-CM | POA: Diagnosis present

## 2016-05-01 DIAGNOSIS — Z7951 Long term (current) use of inhaled steroids: Secondary | ICD-10-CM | POA: Diagnosis not present

## 2016-05-01 DIAGNOSIS — M810 Age-related osteoporosis without current pathological fracture: Secondary | ICD-10-CM | POA: Diagnosis present

## 2016-05-01 DIAGNOSIS — J9622 Acute and chronic respiratory failure with hypercapnia: Secondary | ICD-10-CM | POA: Diagnosis present

## 2016-05-01 DIAGNOSIS — Z9981 Dependence on supplemental oxygen: Secondary | ICD-10-CM

## 2016-05-01 DIAGNOSIS — G2581 Restless legs syndrome: Secondary | ICD-10-CM | POA: Diagnosis present

## 2016-05-01 DIAGNOSIS — M549 Dorsalgia, unspecified: Secondary | ICD-10-CM | POA: Diagnosis present

## 2016-05-01 DIAGNOSIS — E039 Hypothyroidism, unspecified: Secondary | ICD-10-CM | POA: Diagnosis present

## 2016-05-01 DIAGNOSIS — J441 Chronic obstructive pulmonary disease with (acute) exacerbation: Secondary | ICD-10-CM | POA: Diagnosis present

## 2016-05-01 DIAGNOSIS — W2211XA Striking against or struck by driver side automobile airbag, initial encounter: Secondary | ICD-10-CM | POA: Diagnosis not present

## 2016-05-01 DIAGNOSIS — Z79899 Other long term (current) drug therapy: Secondary | ICD-10-CM

## 2016-05-01 DIAGNOSIS — J189 Pneumonia, unspecified organism: Secondary | ICD-10-CM | POA: Diagnosis present

## 2016-05-01 DIAGNOSIS — S20212A Contusion of left front wall of thorax, initial encounter: Secondary | ICD-10-CM | POA: Diagnosis present

## 2016-05-01 DIAGNOSIS — K4090 Unilateral inguinal hernia, without obstruction or gangrene, not specified as recurrent: Secondary | ICD-10-CM | POA: Diagnosis present

## 2016-05-01 DIAGNOSIS — J9621 Acute and chronic respiratory failure with hypoxia: Secondary | ICD-10-CM | POA: Diagnosis present

## 2016-05-01 DIAGNOSIS — J9602 Acute respiratory failure with hypercapnia: Secondary | ICD-10-CM

## 2016-05-01 DIAGNOSIS — J440 Chronic obstructive pulmonary disease with acute lower respiratory infection: Secondary | ICD-10-CM | POA: Diagnosis present

## 2016-05-01 DIAGNOSIS — F17200 Nicotine dependence, unspecified, uncomplicated: Secondary | ICD-10-CM | POA: Diagnosis present

## 2016-05-01 DIAGNOSIS — G8929 Other chronic pain: Secondary | ICD-10-CM | POA: Diagnosis present

## 2016-05-01 DIAGNOSIS — Y92410 Unspecified street and highway as the place of occurrence of the external cause: Secondary | ICD-10-CM

## 2016-05-01 DIAGNOSIS — G9340 Encephalopathy, unspecified: Secondary | ICD-10-CM | POA: Diagnosis present

## 2016-05-01 HISTORY — DX: Acute respiratory failure with hypercapnia: J96.02

## 2016-05-01 HISTORY — DX: Acute respiratory failure with hypoxia: J96.01

## 2016-05-01 HISTORY — DX: Unspecified asthma, uncomplicated: J45.909

## 2016-05-01 LAB — BLOOD GAS, ARTERIAL
ACID-BASE EXCESS: 9.2 mmol/L — AB (ref 0.0–3.0)
BICARBONATE: 37.4 meq/L — AB (ref 21.0–28.0)
FIO2: 0.4
O2 SAT: 99.2 %
PATIENT TEMPERATURE: 37
PH ART: 7.33 — AB (ref 7.350–7.450)
pCO2 arterial: 71 mmHg (ref 32.0–48.0)
pO2, Arterial: 152 mmHg — ABNORMAL HIGH (ref 83.0–108.0)

## 2016-05-01 LAB — HEMOGLOBIN A1C: Hgb A1c MFr Bld: 5.5 % (ref 4.0–6.0)

## 2016-05-01 LAB — URINE DRUG SCREEN, QUALITATIVE (ARMC ONLY)
Amphetamines, Ur Screen: NOT DETECTED
BARBITURATES, UR SCREEN: NOT DETECTED
BENZODIAZEPINE, UR SCRN: POSITIVE — AB
CANNABINOID 50 NG, UR ~~LOC~~: NOT DETECTED
Cocaine Metabolite,Ur ~~LOC~~: NOT DETECTED
MDMA (Ecstasy)Ur Screen: NOT DETECTED
Methadone Scn, Ur: NOT DETECTED
Opiate, Ur Screen: POSITIVE — AB
Phencyclidine (PCP) Ur S: NOT DETECTED
TRICYCLIC, UR SCREEN: POSITIVE — AB

## 2016-05-01 LAB — URINALYSIS COMPLETE WITH MICROSCOPIC (ARMC ONLY)
BACTERIA UA: NONE SEEN
Bilirubin Urine: NEGATIVE
Glucose, UA: NEGATIVE mg/dL
HGB URINE DIPSTICK: NEGATIVE
Ketones, ur: NEGATIVE mg/dL
LEUKOCYTES UA: NEGATIVE
NITRITE: NEGATIVE
PROTEIN: 30 mg/dL — AB
SPECIFIC GRAVITY, URINE: 1.018 (ref 1.005–1.030)
SQUAMOUS EPITHELIAL / LPF: NONE SEEN
pH: 5 (ref 5.0–8.0)

## 2016-05-01 LAB — COMPREHENSIVE METABOLIC PANEL
ALK PHOS: 74 U/L (ref 38–126)
ALT: 10 U/L — AB (ref 17–63)
AST: 23 U/L (ref 15–41)
Albumin: 3.4 g/dL — ABNORMAL LOW (ref 3.5–5.0)
Anion gap: 7 (ref 5–15)
BILIRUBIN TOTAL: 0.7 mg/dL (ref 0.3–1.2)
BUN: 14 mg/dL (ref 6–20)
CALCIUM: 8.9 mg/dL (ref 8.9–10.3)
CO2: 33 mmol/L — AB (ref 22–32)
CREATININE: 0.73 mg/dL (ref 0.61–1.24)
Chloride: 97 mmol/L — ABNORMAL LOW (ref 101–111)
GFR calc non Af Amer: >60 mL/min (ref >60)
GLUCOSE: 119 mg/dL — AB (ref 65–99)
Potassium: 3.6 mmol/L (ref 3.5–5.1)
SODIUM: 137 mmol/L (ref 135–145)
TOTAL PROTEIN: 6.9 g/dL (ref 6.5–8.1)

## 2016-05-01 LAB — TROPONIN I
TROPONIN I: 0.09 ng/mL — AB (ref <0.03)
Troponin I: 0.1 ng/mL (ref <0.03)
Troponin I: 0.21 ng/mL (ref <0.03)
Troponin I: 0.12 ng/mL (ref <0.03)

## 2016-05-01 LAB — GLUCOSE, CAPILLARY: Glucose-Capillary: 86 mg/dL (ref 65–99)

## 2016-05-01 LAB — CBC WITH DIFFERENTIAL/PLATELET
Basophils Absolute: 0 10*3/uL (ref 0–0.1)
Basophils Relative: 0 %
EOS ABS: 0 10*3/uL (ref 0–0.7)
Eosinophils Relative: 0 %
HEMATOCRIT: 35.6 % — AB (ref 40.0–52.0)
HEMOGLOBIN: 11.9 g/dL — AB (ref 13.0–18.0)
LYMPHS ABS: 0.5 10*3/uL — AB (ref 1.0–3.6)
LYMPHS PCT: 2 %
MCH: 29.8 pg (ref 26.0–34.0)
MCHC: 33.3 g/dL (ref 32.0–36.0)
MCV: 89.5 fL (ref 80.0–100.0)
MONOS PCT: 6 %
Monocytes Absolute: 1.1 10*3/uL — ABNORMAL HIGH (ref 0.2–1.0)
NEUTROS ABS: 17.6 10*3/uL — AB (ref 1.4–6.5)
NEUTROS PCT: 92 %
Platelets: 235 10*3/uL (ref 150–440)
RBC: 3.98 MIL/uL — AB (ref 4.40–5.90)
RDW: 14.8 % — ABNORMAL HIGH (ref 11.5–14.5)
WBC: 19.2 10*3/uL — AB (ref 3.8–10.6)

## 2016-05-01 LAB — PROTIME-INR
INR: 1.28
PROTHROMBIN TIME: 16.1 s — AB (ref 11.4–15.2)

## 2016-05-01 LAB — APTT: aPTT: 35 seconds (ref 24–36)

## 2016-05-01 LAB — TSH: TSH: 0.864 u[IU]/mL (ref 0.350–4.500)

## 2016-05-01 MED ORDER — CLONAZEPAM 0.5 MG PO TABS
1.0000 mg | ORAL_TABLET | Freq: Two times a day (BID) | ORAL | Status: DC
  Filled 2016-05-01: qty 2

## 2016-05-01 MED ORDER — ALBUTEROL SULFATE (2.5 MG/3ML) 0.083% IN NEBU: 2.5000 mg | INHALATION_SOLUTION | RESPIRATORY_TRACT | Status: DC

## 2016-05-01 MED ORDER — ENOXAPARIN SODIUM 80 MG/0.8ML ~~LOC~~ SOLN
75.0000 mg | Freq: Two times a day (BID) | SUBCUTANEOUS | Status: DC
  Filled 2016-05-01 (×3): qty 0.8

## 2016-05-01 MED ORDER — GABAPENTIN 400 MG PO CAPS
800.0000 mg | ORAL_CAPSULE | Freq: Three times a day (TID) | ORAL | Status: DC
  Administered 2016-05-01 – 2016-05-03 (×7): 800 mg via ORAL
  Filled 2016-05-01 (×8): qty 2

## 2016-05-01 MED ORDER — SODIUM CHLORIDE 0.9 % IV BOLUS (SEPSIS)
1000.0000 mL | Freq: Once | INTRAVENOUS | Status: AC
  Administered 2016-05-01: 1000 mL via INTRAVENOUS

## 2016-05-01 MED ORDER — MOMETASONE FURO-FORMOTEROL FUM 200-5 MCG/ACT IN AERO
2.0000 | INHALATION_SPRAY | Freq: Two times a day (BID) | RESPIRATORY_TRACT | Status: DC
  Administered 2016-05-01 – 2016-05-03 (×5): 2 via RESPIRATORY_TRACT
  Filled 2016-05-01: qty 8.8

## 2016-05-01 MED ORDER — IPRATROPIUM-ALBUTEROL 0.5-2.5 (3) MG/3ML IN SOLN
3.0000 mL | Freq: Four times a day (QID) | RESPIRATORY_TRACT | Status: DC
  Administered 2016-05-01 – 2016-05-02 (×4): 3 mL via RESPIRATORY_TRACT
  Filled 2016-05-01 (×5): qty 3

## 2016-05-01 MED ORDER — ONDANSETRON HCL 4 MG/2ML IJ SOLN: 4.0000 mg | Freq: Four times a day (QID) | INTRAMUSCULAR | Status: DC | PRN

## 2016-05-01 MED ORDER — DEXTROSE 5 % IV SOLN
1.0000 g | Freq: Once | INTRAVENOUS | Status: AC
  Administered 2016-05-01: 1 g via INTRAVENOUS
  Filled 2016-05-01: qty 10

## 2016-05-01 MED ORDER — IPRATROPIUM BROMIDE HFA 17 MCG/ACT IN AERS: 2.0000 | INHALATION_SPRAY | Freq: Four times a day (QID) | RESPIRATORY_TRACT | Status: DC

## 2016-05-01 MED ORDER — IOPAMIDOL (ISOVUE-300) INJECTION 61%
100.0000 mL | Freq: Once | INTRAVENOUS | Status: AC | PRN
  Administered 2016-05-01: 100 mL via INTRAVENOUS

## 2016-05-01 MED ORDER — ROPINIROLE HCL 1 MG PO TABS
1.0000 mg | ORAL_TABLET | Freq: Every day | ORAL | Status: DC
  Administered 2016-05-01 – 2016-05-02 (×2): 1 mg via ORAL
  Filled 2016-05-01 (×2): qty 1

## 2016-05-01 MED ORDER — ACETAMINOPHEN 650 MG RE SUPP: 650.0000 mg | Freq: Four times a day (QID) | RECTAL | Status: DC | PRN

## 2016-05-01 MED ORDER — DEXTROSE 5 % IV SOLN
1.0000 g | INTRAVENOUS | Status: DC
  Administered 2016-05-02: 1 g via INTRAVENOUS
  Filled 2016-05-01 (×3): qty 10

## 2016-05-01 MED ORDER — SODIUM CHLORIDE 0.9% FLUSH
3.0000 mL | Freq: Two times a day (BID) | INTRAVENOUS | Status: DC
  Administered 2016-05-01 – 2016-05-03 (×5): 3 mL via INTRAVENOUS

## 2016-05-01 MED ORDER — LEVOTHYROXINE SODIUM 25 MCG PO TABS
25.0000 ug | ORAL_TABLET | Freq: Every day | ORAL | Status: DC
  Administered 2016-05-02 – 2016-05-03 (×2): 25 ug via ORAL
  Filled 2016-05-01 (×2): qty 1

## 2016-05-01 MED ORDER — ACETAMINOPHEN 325 MG PO TABS
650.0000 mg | ORAL_TABLET | Freq: Four times a day (QID) | ORAL | Status: DC | PRN
  Filled 2016-05-01: qty 2

## 2016-05-01 MED ORDER — AZITHROMYCIN 250 MG PO TABS
250.0000 mg | ORAL_TABLET | Freq: Every day | ORAL | Status: DC
  Administered 2016-05-02 – 2016-05-03 (×2): 250 mg via ORAL
  Filled 2016-05-01 (×2): qty 1

## 2016-05-01 MED ORDER — CYCLOBENZAPRINE HCL 10 MG PO TABS
10.0000 mg | ORAL_TABLET | Freq: Three times a day (TID) | ORAL | Status: DC | PRN
  Administered 2016-05-02 (×3): 10 mg via ORAL
  Filled 2016-05-01 (×3): qty 1

## 2016-05-01 MED ORDER — GABAPENTIN 800 MG PO TABS
800.0000 mg | ORAL_TABLET | Freq: Three times a day (TID) | ORAL | Status: DC
  Filled 2016-05-01: qty 1

## 2016-05-01 MED ORDER — IPRATROPIUM BROMIDE 0.02 % IN SOLN: 0.5000 mg | Freq: Four times a day (QID) | RESPIRATORY_TRACT | Status: DC

## 2016-05-01 MED ORDER — VITAMIN D 1000 UNITS PO TABS
1000.0000 [IU] | ORAL_TABLET | Freq: Every day | ORAL | Status: DC
Start: 2016-05-01 — End: 2016-05-03
  Administered 2016-05-01 – 2016-05-03 (×3): 1000 [IU] via ORAL
  Filled 2016-05-01 (×3): qty 1

## 2016-05-01 MED ORDER — DOCUSATE SODIUM 100 MG PO CAPS
100.0000 mg | ORAL_CAPSULE | Freq: Two times a day (BID) | ORAL | Status: DC
  Administered 2016-05-01 – 2016-05-03 (×5): 100 mg via ORAL
  Filled 2016-05-01 (×5): qty 1

## 2016-05-01 MED ORDER — ONDANSETRON HCL 4 MG PO TABS: 4.0000 mg | ORAL_TABLET | Freq: Four times a day (QID) | ORAL | Status: DC | PRN

## 2016-05-01 MED ORDER — ENOXAPARIN SODIUM 40 MG/0.4ML ~~LOC~~ SOLN: 40.0000 mg | SUBCUTANEOUS | Status: DC

## 2016-05-01 MED ORDER — METHYLPREDNISOLONE SODIUM SUCC 125 MG IJ SOLR
60.0000 mg | INTRAMUSCULAR | Status: DC
  Administered 2016-05-01: 60 mg via INTRAVENOUS
  Filled 2016-05-01: qty 2

## 2016-05-01 MED ORDER — AZITHROMYCIN 500 MG IV SOLR
500.0000 mg | Freq: Once | INTRAVENOUS | Status: AC
  Administered 2016-05-01: 500 mg via INTRAVENOUS
  Filled 2016-05-01: qty 500

## 2016-05-01 MED ORDER — NALOXONE HCL 2 MG/2ML IJ SOSY
0.4000 mg | PREFILLED_SYRINGE | Freq: Once | INTRAMUSCULAR | Status: AC
  Administered 2016-05-01: 0.4 mg via INTRAVENOUS
  Filled 2016-05-01: qty 2

## 2016-05-01 NOTE — ED Notes (Signed)
Pt resting quietly with eyes closed at this time. Bipap on in place.  Pt able to move all extremities. Bolus is still dripping in.

## 2016-05-01 NOTE — Progress Notes (Signed)
Patient refused Bipap.

## 2016-05-01 NOTE — ED Notes (Signed)
EMS VS at scene BP 119/66, HR 128, End Tidal 46, RR 16, BS 127. NKDA.

## 2016-05-01 NOTE — Progress Notes (Signed)
Pt taken off BIPAP placed on 3lpm [place], sats 93%, ER MD and RN notified. Will place back on BIPAP if needed.  Will continue to monitor

## 2016-05-01 NOTE — ED Notes (Addendum)
Patient c/o arm pain where AC IV is placed on the right. IV has infiltrated and was removed.  Pt is responsive, but is drowsy.  Bipap removed by RT and pt placed on 3L [place] sating at 92%. Pt is able to hold conversation, but easily falls asleep. Arousable by voice.

## 2016-05-01 NOTE — ED Notes (Signed)
Patient transported to CT 
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
## 2016-05-01 NOTE — Progress Notes (Signed)
Pt has remained alert and oriented x 4. Lung sounds are rhonchus with exp wheezes-94% on 2LNC. Pt has not required bi-pap since admission to ICU this am. ST on cardiac monitor. Pt with orders to transfer to floor.

## 2016-05-01 NOTE — H&P (Signed)
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 Albuterol nebulizers as needed. I have edited his home medication regimen to include only 1 of his combination inhalers instead of dual therapy with Symbicort and Advair. Continue Atrovent. 4. Hypothyroidism: Continue Synthroid. Check TSH. 5. Restless leg syndrome: Continue Requip as well as Flexeril and Klonopin as needed. 6. Neuropathy: Continue Neurontin (high doses may also contribute to some degree of somnolence). 7. DVT prophylaxis: Lovenox 8. GI prophylaxis: None The patient is a full code. Time spent on admission orders and critical care approximately 45 minutes  Harrie Foreman, MD 05/01/2016, 6:40 AM
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
## 2016-05-01 NOTE — ED Notes (Signed)
Dr Edd Fabian notified of troponin 0.10
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
## 2016-05-02 MED ORDER — GABAPENTIN 800 MG PO TABS: 800.0000 mg | ORAL_TABLET | Freq: Three times a day (TID) | ORAL | Status: DC

## 2016-05-02 MED ORDER — OXYCODONE HCL 5 MG PO TABS
5.0000 mg | ORAL_TABLET | ORAL | Status: DC | PRN
Start: 2016-05-02 — End: 2016-05-03
  Administered 2016-05-02 – 2016-05-03 (×4): 5 mg via ORAL
  Filled 2016-05-02 (×4): qty 1

## 2016-05-02 MED ORDER — ENOXAPARIN SODIUM 40 MG/0.4ML ~~LOC~~ SOLN
40.0000 mg | SUBCUTANEOUS | Status: DC
  Filled 2016-05-02 (×2): qty 0.4

## 2016-05-02 MED ORDER — PREDNISONE 20 MG PO TABS
40.0000 mg | ORAL_TABLET | Freq: Every day | ORAL | Status: DC
  Administered 2016-05-02 – 2016-05-03 (×2): 40 mg via ORAL
  Filled 2016-05-02 (×2): qty 2

## 2016-05-02 MED ORDER — CLONAZEPAM 1 MG PO TABS
1.0000 mg | ORAL_TABLET | Freq: Two times a day (BID) | ORAL | Status: DC
  Administered 2016-05-02 – 2016-05-03 (×3): 1 mg via ORAL
  Filled 2016-05-02 (×3): qty 1

## 2016-05-02 MED ORDER — IPRATROPIUM-ALBUTEROL 0.5-2.5 (3) MG/3ML IN SOLN
3.0000 mL | RESPIRATORY_TRACT | Status: DC | PRN
  Administered 2016-05-03: 3 mL via RESPIRATORY_TRACT
  Filled 2016-05-02: qty 3
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
## 2016-05-02 NOTE — Progress Notes (Signed)
Troponin normalize, no further need for theraputic lovnox
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
## 2016-05-02 NOTE — Progress Notes (Signed)
Pt refused lovenox
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
## 2016-05-03 MED ORDER — LEVOFLOXACIN 500 MG PO TABS: 500.0000 mg | ORAL_TABLET | Freq: Every day | ORAL | 0 refills | Status: DC

## 2016-05-03 MED ORDER — GUAIFENESIN-DM 100-10 MG/5ML PO SYRP: 5.0000 mL | ORAL_SOLUTION | ORAL | 0 refills | Status: DC | PRN

## 2016-05-03 MED ORDER — PREDNISONE 10 MG (21) PO TBPK: 10.0000 mg | ORAL_TABLET | Freq: Every day | ORAL | 0 refills | Status: DC

## 2016-05-03 MED ORDER — OXYCODONE HCL 10 MG PO TABS: 10.0000 mg | ORAL_TABLET | ORAL | 0 refills | Status: DC | PRN

## 2016-05-03 NOTE — Progress Notes (Signed)
Alert and oriented. Medicated for back pain during the night. Pt anxious to be to discharged.

## 2016-05-03 NOTE — Care Management (Signed)
O2 delivered to room by [facility]. Brother will pick patient up at time of discharge. Signed off.
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
## 2016-05-03 NOTE — Progress Notes (Signed)
Discharge summary reviewed with patient with verbal understanding. 1 narcotic Rx and 3 Rx given upon discharge. O2 delivered, personal tank. Brother in care of transportation home. VSS at this time

## 2016-05-03 NOTE — Progress Notes (Signed)
PT Cancellation Note  Patient Details Name: Mark [name] MRN: [id number] DOB: 1950-11-01   Cancelled Treatment:    Reason Eval/Treat Not Completed: Patient declined, no reason specified. Spoke with nursing prior to attempted treatment for need. Nursing agrees to attempt. Treatment attempted to offer ambulation/steps before going home to assess safety/ability. Pt adamantly refuses, and just wants to go home. Pt preparing for discharge currently. Complete current PT orders.    Charlaine Dalton, [place] 05/03/2016, 11:27 AM

## 2016-05-03 NOTE — Care Management Important Message (Signed)
Important Message  Patient Details  Name: Mark [name] MRN: [id number] Date of Birth: 1951/07/10   Medicare Important Message Given:  N/A - LOS <3 / Initial given by admissions    Alvie Heidelberg, RN 05/03/2016, 11:23 AM

## 2016-05-05 ENCOUNTER — Ambulatory Visit
Admission: RE | Admit: 2016-05-05 | Discharge: 2016-05-05 | Disposition: A | Discharge status: Home / Self Care | Payer: Medicare Other | Source: Ambulatory Visit | Attending: Pain Medicine | Admitting: Pain Medicine

## 2016-05-05 ENCOUNTER — Other Ambulatory Visit
Admission: RE | Admit: 2016-05-05 | Discharge: 2016-05-05 | Disposition: A | Discharge status: Home / Self Care | Payer: Medicare Other | Source: Ambulatory Visit | Attending: Pain Medicine | Admitting: Pain Medicine

## 2016-05-05 ENCOUNTER — Ambulatory Visit: Payer: Medicare Other | Attending: Pain Medicine | Admitting: Pain Medicine

## 2016-05-05 ENCOUNTER — Encounter: Payer: Self-pay | Admitting: Pain Medicine

## 2016-05-05 VITALS — BP 118/75 | HR 112 | Temp 97.8°F | Resp 16 | Ht 67.0 in | Wt 156.0 lb

## 2016-05-05 DIAGNOSIS — M25512 Pain in left shoulder: Secondary | ICD-10-CM | POA: Insufficient documentation

## 2016-05-05 DIAGNOSIS — G629 Polyneuropathy, unspecified: Secondary | ICD-10-CM

## 2016-05-05 DIAGNOSIS — M79604 Pain in right leg: Secondary | ICD-10-CM

## 2016-05-05 DIAGNOSIS — M545 Low back pain: Secondary | ICD-10-CM | POA: Diagnosis present

## 2016-05-05 DIAGNOSIS — M4856XS Collapsed vertebra, not elsewhere classified, lumbar region, sequela of fracture: Secondary | ICD-10-CM | POA: Insufficient documentation

## 2016-05-05 DIAGNOSIS — M8000XA Age-related osteoporosis with current pathological fracture, unspecified site, initial encounter for fracture: Secondary | ICD-10-CM | POA: Insufficient documentation

## 2016-05-05 DIAGNOSIS — M8008XA Age-related osteoporosis with current pathological fracture, vertebra(e), initial encounter for fracture: Secondary | ICD-10-CM | POA: Insufficient documentation

## 2016-05-05 DIAGNOSIS — R200 Anesthesia of skin: Secondary | ICD-10-CM | POA: Insufficient documentation

## 2016-05-05 DIAGNOSIS — G2581 Restless legs syndrome: Secondary | ICD-10-CM

## 2016-05-05 DIAGNOSIS — M810 Age-related osteoporosis without current pathological fracture: Secondary | ICD-10-CM | POA: Diagnosis not present

## 2016-05-05 DIAGNOSIS — M4802 Spinal stenosis, cervical region: Secondary | ICD-10-CM

## 2016-05-05 DIAGNOSIS — Z79891 Long term (current) use of opiate analgesic: Secondary | ICD-10-CM

## 2016-05-05 DIAGNOSIS — M791 Myalgia: Secondary | ICD-10-CM | POA: Diagnosis not present

## 2016-05-05 DIAGNOSIS — M25562 Pain in left knee: Secondary | ICD-10-CM | POA: Diagnosis not present

## 2016-05-05 DIAGNOSIS — R911 Solitary pulmonary nodule: Secondary | ICD-10-CM | POA: Diagnosis not present

## 2016-05-05 DIAGNOSIS — M25561 Pain in right knee: Secondary | ICD-10-CM | POA: Insufficient documentation

## 2016-05-05 DIAGNOSIS — M25511 Pain in right shoulder: Secondary | ICD-10-CM | POA: Insufficient documentation

## 2016-05-05 DIAGNOSIS — F419 Anxiety disorder, unspecified: Secondary | ICD-10-CM | POA: Insufficient documentation

## 2016-05-05 DIAGNOSIS — R209 Unspecified disturbances of skin sensation: Secondary | ICD-10-CM

## 2016-05-05 DIAGNOSIS — K210 Gastro-esophageal reflux disease with esophagitis: Secondary | ICD-10-CM | POA: Insufficient documentation

## 2016-05-05 DIAGNOSIS — M5116 Intervertebral disc disorders with radiculopathy, lumbar region: Secondary | ICD-10-CM | POA: Insufficient documentation

## 2016-05-05 DIAGNOSIS — T402X5A Adverse effect of other opioids, initial encounter: Secondary | ICD-10-CM

## 2016-05-05 DIAGNOSIS — M25559 Pain in unspecified hip: Secondary | ICD-10-CM | POA: Diagnosis not present

## 2016-05-05 DIAGNOSIS — S32020S Wedge compression fracture of second lumbar vertebra, sequela: Secondary | ICD-10-CM | POA: Diagnosis not present

## 2016-05-05 DIAGNOSIS — M8580 Other specified disorders of bone density and structure, unspecified site: Secondary | ICD-10-CM | POA: Diagnosis not present

## 2016-05-05 DIAGNOSIS — F1190 Opioid use, unspecified, uncomplicated: Secondary | ICD-10-CM

## 2016-05-05 DIAGNOSIS — Z7982 Long term (current) use of aspirin: Secondary | ICD-10-CM | POA: Insufficient documentation

## 2016-05-05 DIAGNOSIS — G8929 Other chronic pain: Secondary | ICD-10-CM | POA: Insufficient documentation

## 2016-05-05 DIAGNOSIS — M8088XS Other osteoporosis with current pathological fracture, vertebra(e), sequela: Secondary | ICD-10-CM

## 2016-05-05 DIAGNOSIS — I2510 Atherosclerotic heart disease of native coronary artery without angina pectoris: Secondary | ICD-10-CM | POA: Diagnosis not present

## 2016-05-05 DIAGNOSIS — N529 Male erectile dysfunction, unspecified: Secondary | ICD-10-CM | POA: Insufficient documentation

## 2016-05-05 DIAGNOSIS — K5903 Drug induced constipation: Secondary | ICD-10-CM | POA: Diagnosis not present

## 2016-05-05 DIAGNOSIS — J449 Chronic obstructive pulmonary disease, unspecified: Secondary | ICD-10-CM | POA: Insufficient documentation

## 2016-05-05 DIAGNOSIS — G40909 Epilepsy, unspecified, not intractable, without status epilepticus: Secondary | ICD-10-CM | POA: Insufficient documentation

## 2016-05-05 DIAGNOSIS — M542 Cervicalgia: Secondary | ICD-10-CM | POA: Diagnosis not present

## 2016-05-05 DIAGNOSIS — M549 Dorsalgia, unspecified: Secondary | ICD-10-CM

## 2016-05-05 DIAGNOSIS — M533 Sacrococcygeal disorders, not elsewhere classified: Secondary | ICD-10-CM

## 2016-05-05 DIAGNOSIS — M7918 Myalgia, other site: Secondary | ICD-10-CM

## 2016-05-05 DIAGNOSIS — D638 Anemia in other chronic diseases classified elsewhere: Secondary | ICD-10-CM

## 2016-05-05 DIAGNOSIS — F329 Major depressive disorder, single episode, unspecified: Secondary | ICD-10-CM | POA: Insufficient documentation

## 2016-05-05 DIAGNOSIS — F1290 Cannabis use, unspecified, uncomplicated: Secondary | ICD-10-CM | POA: Insufficient documentation

## 2016-05-05 DIAGNOSIS — M79605 Pain in left leg: Secondary | ICD-10-CM

## 2016-05-05 DIAGNOSIS — M47816 Spondylosis without myelopathy or radiculopathy, lumbar region: Secondary | ICD-10-CM

## 2016-05-05 DIAGNOSIS — E034 Atrophy of thyroid (acquired): Secondary | ICD-10-CM | POA: Insufficient documentation

## 2016-05-05 DIAGNOSIS — S32020A Wedge compression fracture of second lumbar vertebra, initial encounter for closed fracture: Secondary | ICD-10-CM | POA: Insufficient documentation

## 2016-05-05 DIAGNOSIS — K4090 Unilateral inguinal hernia, without obstruction or gangrene, not specified as recurrent: Secondary | ICD-10-CM | POA: Insufficient documentation

## 2016-05-05 DIAGNOSIS — D649 Anemia, unspecified: Secondary | ICD-10-CM | POA: Insufficient documentation

## 2016-05-05 DIAGNOSIS — K4091 Unilateral inguinal hernia, without obstruction or gangrene, recurrent: Secondary | ICD-10-CM

## 2016-05-05 DIAGNOSIS — M1711 Unilateral primary osteoarthritis, right knee: Secondary | ICD-10-CM | POA: Insufficient documentation

## 2016-05-05 DIAGNOSIS — Z87898 Personal history of other specified conditions: Secondary | ICD-10-CM | POA: Diagnosis not present

## 2016-05-05 DIAGNOSIS — M5124 Other intervertebral disc displacement, thoracic region: Secondary | ICD-10-CM

## 2016-05-05 DIAGNOSIS — M5382 Other specified dorsopathies, cervical region: Secondary | ICD-10-CM

## 2016-05-05 DIAGNOSIS — M792 Neuralgia and neuritis, unspecified: Secondary | ICD-10-CM

## 2016-05-05 DIAGNOSIS — M47812 Spondylosis without myelopathy or radiculopathy, cervical region: Secondary | ICD-10-CM | POA: Insufficient documentation

## 2016-05-05 DIAGNOSIS — R590 Localized enlarged lymph nodes: Secondary | ICD-10-CM | POA: Diagnosis not present

## 2016-05-05 DIAGNOSIS — R296 Repeated falls: Secondary | ICD-10-CM | POA: Insufficient documentation

## 2016-05-05 DIAGNOSIS — M47892 Other spondylosis, cervical region: Secondary | ICD-10-CM

## 2016-05-05 DIAGNOSIS — M62830 Muscle spasm of back: Secondary | ICD-10-CM | POA: Diagnosis not present

## 2016-05-05 DIAGNOSIS — M1611 Unilateral primary osteoarthritis, right hip: Secondary | ICD-10-CM | POA: Insufficient documentation

## 2016-05-05 DIAGNOSIS — M79606 Pain in leg, unspecified: Secondary | ICD-10-CM | POA: Diagnosis not present

## 2016-05-05 DIAGNOSIS — R4789 Other speech disturbances: Secondary | ICD-10-CM

## 2016-05-05 DIAGNOSIS — M4806 Spinal stenosis, lumbar region: Secondary | ICD-10-CM | POA: Insufficient documentation

## 2016-05-05 DIAGNOSIS — R599 Enlarged lymph nodes, unspecified: Secondary | ICD-10-CM

## 2016-05-05 DIAGNOSIS — F1291 Cannabis use, unspecified, in remission: Secondary | ICD-10-CM

## 2016-05-05 DIAGNOSIS — J9601 Acute respiratory failure with hypoxia: Secondary | ICD-10-CM | POA: Insufficient documentation

## 2016-05-05 DIAGNOSIS — Z79899 Other long term (current) drug therapy: Secondary | ICD-10-CM

## 2016-05-05 LAB — COMPREHENSIVE METABOLIC PANEL
ALK PHOS: 64 U/L (ref 38–126)
ALT: 9 U/L — ABNORMAL LOW (ref 17–63)
ANION GAP: 8 (ref 5–15)
AST: 14 U/L — ABNORMAL LOW (ref 15–41)
Albumin: 3.3 g/dL — ABNORMAL LOW (ref 3.5–5.0)
BILIRUBIN TOTAL: 0.5 mg/dL (ref 0.3–1.2)
BUN: 9 mg/dL (ref 6–20)
CALCIUM: 9 mg/dL (ref 8.9–10.3)
CO2: 37 mmol/L — ABNORMAL HIGH (ref 22–32)
Chloride: 90 mmol/L — ABNORMAL LOW (ref 101–111)
Creatinine, Ser: 0.8 mg/dL (ref 0.61–1.24)
GFR calc non Af Amer: >60 mL/min (ref >60)
Glucose, Bld: 114 mg/dL — ABNORMAL HIGH (ref 65–99)
Potassium: 2.9 mmol/L — ABNORMAL LOW (ref 3.5–5.1)
Sodium: 135 mmol/L (ref 135–145)
TOTAL PROTEIN: 6.8 g/dL (ref 6.5–8.1)

## 2016-05-05 LAB — MAGNESIUM: MAGNESIUM: 1.8 mg/dL (ref 1.7–2.4)

## 2016-05-05 LAB — SEDIMENTATION RATE: SED RATE: 53 mm/h — AB (ref 0–20)

## 2016-05-05 NOTE — Progress Notes (Signed)
Safety precautions to be maintained throughout the outpatient stay will include: orient to surroundings, keep bed in low position, maintain call bell within reach at all times, provide assistance with transfer out of bed and ambulation.  Began antibiotics, Prednisone, and cough med on 05-04-16

## 2016-05-05 NOTE — Progress Notes (Signed)
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 nephrolithiasis, hematuria, renal failure or chronic kidney disease Hematological History: Brusing easily Endocrine History: Hypothyroidism Rheumatologic History: Osteoarthritis Musculoskeletal History: Negative for myasthenia gravis, muscular dystrophy, multiple sclerosis or malignant hyperthermia Work History: Disabled since May 2006 secondary to severe COPD.  Allergies  Mark [name] has No Known Allergies.  Laboratory Chemistry  Inflammation Markers No results found for: ESRSEDRATE, CRP  Renal Function Lab Results  Component Value Date   BUN 14 05/01/2016   CREATININE 0.73 05/01/2016   GFRAA >60 05/01/2016   GFRNONAA >60 05/01/2016    Hepatic Function Lab Results  Component Value Date   AST 23 05/01/2016   ALT 10 (L) 05/01/2016   ALBUMIN 3.4 (L) 05/01/2016    Electrolytes Lab Results  Component Value Date   NA 137 05/01/2016   K 3.6 05/01/2016   CL 97 (L) 05/01/2016   CALCIUM 8.9  05/01/2016    Pain Modulating Vitamins No results found for: Maralyn Sago [id number], [id number], [id number], 25OHVITD1, 25OHVITD2, 25OHVITD3, VITAMINB12  Coagulation Parameters Lab Results  Component Value Date   INR 1.28 05/01/2016   LABPROT 16.1 (H) 05/01/2016   APTT 35 05/01/2016   PLT 235 05/01/2016    Cardiovascular Lab Results  Component Value Date   HGB 11.9 (L) 05/01/2016   HCT 35.6 (L) 05/01/2016    Note: Lab results reviewed.  [place]  Medical:  Mark [name]  has a past medical history of Asthma; COPD (chronic obstructive pulmonary disease) ([place]); and  Osteoporosis. Family: family history is not on file. Surgical:  has a past surgical history that includes Back surgery. Tobacco:  reports that he has been smoking.  He has never used smokeless tobacco. Alcohol:  reports that he does not drink alcohol. Drug:  reports that he does not use drugs. Active Ambulatory Problems    Diagnosis Date Noted  . Chronic pain 03/14/2016  . Opiate use (75 MME/Day) 03/14/2016  . Long term prescription opiate use 03/14/2016  . Long term current use of opiate analgesic 03/14/2016  . Long term prescription benzodiazepine use 03/14/2016  . Anxiety state 05/27/2004  . 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 Compression fractures (L4, L3, L1, T12 and T11) 06/26/2015  . Clinical depression 01/20/2005  . Acquired atrophy of thyroid 06/26/2015  . Other long term (current) drug therapy 07/12/2013  . OP (osteoporosis) 06/26/2015  . Esophagitis, reflux 06/01/2004  . Current tobacco use 06/01/2004  . History of marijuana use 03/14/2016  . Acute respiratory failure with hypoxia and hypercapnia ([place]) 05/01/2016  . Disturbance of skin sensation 05/05/2016  . Peripheral neuropathy ([place]) 05/05/2016  . Chronic hip pain (Location of Tertiary source of pain) (Bilateral) (L>R) 05/05/2016  . Chronic knee pain (Bilateral) (L>R) 05/05/2016  . Chronic shoulder  pain (Bilateral) (R>L) 05/05/2016  . Opioid-induced constipation (OIC) 05/05/2016  . Restless leg syndrome 05/05/2016  . Mediastinal adenopathy 05/05/2016  . Pulmonary nodule (7 mm right lower lobe) 05/05/2016  . Inguinal hernia without obstruction (Right) 05/05/2016  . 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 Chronic back pain (Location of Primary Source of Pain) (Bilateral) (R>L) 05/05/2016  . Spasm of back muscles 05/05/2016  . Chronic musculoskeletal pain 05/05/2016  . Neurogenic pain 05/05/2016  . Neuropathic pain 05/05/2016  . Sacral back pain (Location of Secondary source of pain) (Bilateral) (L>R) 05/05/2016  . Logorrhea 05/05/2016  . Chronic lower extremity pain (Bilateral) (L>R) 05/05/2016  . Chronic neck pain (posterior) (L>R) 05/05/2016  . 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 Cervical facet hypertrophy (C2-3) (Bilateral) (L>R) 05/05/2016  . Cervical facet syndrome (Bilateral) (L>R) 05/05/2016  . 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) 05/05/2016  . Compression fracture of L2 ([place]) (seen on 11/25/2014 x-ray) 05/05/2016  . Anemia, chronic disease 05/05/2016  . Lumbar facet syndrome (Bilateral) (R>L) 05/05/2016   Resolved Ambulatory Problems    Diagnosis Date Noted  . No Resolved Ambulatory Problems   Past Medical History:  Diagnosis Date  . Asthma   . COPD (chronic obstructive pulmonary disease) ([place])   . Osteoporosis     Constitutional Exam  Vitals: Blood pressure 118/75, pulse (!) 112, temperature 97.8 F (36.6 C), temperature source Oral, resp. rate 16, height 5\' 7"  (1.702 m), weight 156 lb (70.8 kg), SpO2 (!) 83 %. General appearance: alert, cooperative, distracted, slowed mentation, oriented, in no distress, cachectic, well nourished and well hydrated Calculated BMI/Body habitus: Body mass index is 24.43 kg/m. (18.5-24.9 kg/m2) Ideal body  weight Psych/Mental status: Alert and oriented x 3 (person, place, & time). The patient suffers from logorrhea. This made the interview very difficult to handle. In addition, he seems to have problems with memory as he thought he only had two vertebral body augmentation (kyphoplasty/vertebroplasty) done. However, review has demonstrated that he has had 5. Even when I confronted him about this and I showed him the results of the diagnostic test and the x-rays, he still said  that this was not right. Eyes: PERLA Respiratory: Oxygen-dependent COPD  Cervical Spine Exam  Inspection: No masses, redness, or swelling Alignment: Symmetrical Functional ROM: Decreased ROM Stability: No instability detected Muscle strength & Tone: Functionally intact Sensory: Unimpaired Palpation: Non-contributory  Upper Extremity (UE) Exam    Side: Right upper extremity  Side: Left upper extremity  Inspection: No masses, redness, swelling, or asymmetry  Inspection: No masses, redness, swelling, or asymmetry  Functional ROM: Limited ROM  Functional ROM: Limited ROM  Muscle strength & Tone: Functionally intact  Muscle strength & Tone: Functionally intact  Sensory: Movement-associated discomfort  Sensory: Movement-associated discomfort  Palpation: Non-contributory  Palpation: Non-contributory   Thoracic Spine Exam  Inspection: increased thoracic Kyphosis Alignment: Symmetrical Functional ROM: Decreased ROM Stability: No instability detected Sensory: Movement-associated pain Muscle strength & Tone: Functionally intact Palpation: Complains of area being tender to palpation  Lumbar Spine Exam  Inspection: No masses, redness, or swelling Alignment: Symmetrical Functional ROM: Decreased ROM Stability: No instability detected Muscle strength & Tone: Functionally intact Sensory: Movement-associated pain Palpation: Complains of area being tender to palpation Provocative Tests: Lumbar Hyperextension and rotation  test: Positive bilaterally for facet joint pain. Patrick's Maneuver: evaluation deferred today              Gait & Posture Assessment  Ambulation: Patient ambulates using a walker Gait: Very limited, using assistive device to ambulate Posture: Thoracic kyphosis   Lower Extremity Exam    Side: Right lower extremity  Side: Left lower extremity  Inspection: No masses, redness, swelling, or asymmetry  Inspection: No masses, redness, swelling, or asymmetry  Functional ROM: Decreased ROM  Functional ROM: Decreased ROM  Muscle strength & Tone: Deconditioned  Muscle strength & Tone: Deconditioned  Sensory: Unimpaired  Sensory: Unimpaired  Palpation: Non-contributory  Palpation: Non-contributory    Assessment  Primary Diagnosis & Pertinent Problem List: The primary encounter diagnosis was Chronic pain. Diagnoses of Wedge compression fracture of second lumbar vertebra, sequela, History of marijuana use, Long term current use of opiate analgesic, Long term prescription benzodiazepine use, Long term prescription opiate use, Opiate use, OP (osteoporosis), Disturbance of skin sensation, Peripheral polyneuropathy ([place]), Chronic hip pain, unspecified laterality, Bilateral chronic knee pain, Chronic pain of both shoulders, Opioid-induced constipation (OIC), Restless leg syndrome, Mediastinal adenopathy, Pulmonary nodule (7 mm right lower lobe), Unilateral recurrent inguinal hernia without obstruction or gangrene, 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, sequela, Chronic back pain (Location of Primary Source of Pain) (Bilateral) (R>L), Spasm of back muscles, Chronic musculoskeletal pain, Neurogenic pain, Neuropathic pain, Sacral back pain, Logorrhea, Chronic pain of lower extremity, unspecified laterality, Chronic neck pain (posterior) (L>R), 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  Visit Diagnosis: 1. Chronic pain   2. Wedge compression fracture of second lumbar vertebra, sequela   3. History of marijuana use   4. Long term current use of opiate analgesic   5. Long term prescription benzodiazepine use   6. Long term prescription opiate use   7. Opiate use   8. OP (osteoporosis)   9. Disturbance of skin sensation   10. Peripheral polyneuropathy ([place])   11. Chronic hip pain, unspecified laterality   12. Bilateral chronic knee pain   13. Chronic pain of both shoulders   14. Opioid-induced constipation (OIC)   15.  Restless leg syndrome   16. Mediastinal adenopathy   17. Pulmonary nodule (7 mm right lower lobe)   18. Unilateral recurrent inguinal hernia without obstruction or gangrene   19. 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, sequela   20. Chronic back pain (Location of Primary Source of Pain) (Bilateral) (R>L)   21. Spasm of back muscles   22. Chronic musculoskeletal pain   23. Neurogenic pain   24. Neuropathic pain   25. Sacral back pain   26. Logorrhea   27. Chronic pain of lower extremity, unspecified laterality   28. Chronic neck pain (posterior) (L>R)   29. 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 Cervical facet hypertrophy (C2-3) (Bilateral) (L>R)   31. Cervical facet syndrome (Bilateral) (L>R)   32. 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)   33. Compression fracture of L2, sequela   34. Anemia, chronic disease   35. Lumbar facet syndrome (Bilateral) (R>L)     Assessment: No problem-specific Assessment & Plan notes found for this encounter.   Plan of Care  Initial Treatment Plan:  Please be advised that as per protocol, today's visit has been an evaluation only. We have not taken over the patient's  controlled substance management.  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) (Chronic)   Chronic back pain (Location of Primary Source of Pain) (Bilateral) (R>L) (Chronic)   Relevant Medications   aspirin 81 MG tablet   ibuprofen (ADVIL,MOTRIN) 200 MG tablet   Chronic hip pain (Location of Tertiary source of pain) (Bilateral) (L>R) (Chronic)   Relevant Medications   aspirin 81 MG tablet   ibuprofen (ADVIL,MOTRIN) 200 MG tablet   Other Relevant Orders   DG HIP UNILAT W OR W/O PELVIS 2-3 VIEWS LEFT   DG HIP UNILAT W OR W/O PELVIS 2-3 VIEWS RIGHT   Chronic knee pain (Bilateral) (L>R) (Chronic)   Relevant Medications   aspirin 81 MG tablet   ibuprofen (ADVIL,MOTRIN) 200 MG tablet   Other Relevant Orders   DG Knee 1-2 Views Left   DG Knee 1-2 Views Right   Chronic lower extremity pain (Bilateral) (L>R) (Chronic)   Chronic musculoskeletal pain (Chronic)   Relevant Medications   aspirin 81 MG tablet   ibuprofen (ADVIL,MOTRIN) 200 MG tablet   Chronic neck pain (posterior) (L>R) (Chronic)   Relevant Medications   aspirin 81 MG tablet   ibuprofen (ADVIL,MOTRIN) 200 MG tablet   Chronic pain - Primary (Chronic)   Relevant Medications   aspirin 81 MG tablet   ibuprofen (ADVIL,MOTRIN) 200 MG tablet   Other Relevant Orders   Comprehensive metabolic panel   C-reactive protein   Magnesium   Sedimentation rate   Ambulatory referral to Psychology   Chronic shoulder pain (Bilateral) (R>L) (Chronic)   Relevant Orders   DG Shoulder Left   DG Shoulder Right   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 (HCC) (T11, T12, L1, L3, and L4) (Chronic)   Compression fracture of L2 (HCC) (seen on 11/25/2014 x-ray) (Chronic)   Lumbar facet syndrome (Bilateral) (R>L) (Chronic)   Relevant Medications   aspirin 81 MG  tablet   ibuprofen (ADVIL,MOTRIN) 200 MG tablet   Neurogenic pain (Chronic)   Neuropathic pain (Chronic)   Peripheral neuropathy (HCC) (Chronic)   Relevant Orders   Vitamin B12   NCV with EMG(electromyography)  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) (Chronic)   Sacral back pain (Location of Secondary source of pain) (Bilateral) (L>R) (Chronic)   Relevant Medications   aspirin 81 MG tablet   ibuprofen (ADVIL,MOTRIN) 200 MG tablet   Spasm of back muscles (Chronic)     Medium   History of marijuana use   Long term current use of opiate analgesic (Chronic)   Relevant Orders   Compliance Drug Analysis, Ur   Ambulatory referral to Psychology   Drug Screen 10 W/Conf, Serum   Long term prescription benzodiazepine use (Chronic)   Long term prescription opiate use (Chronic)   Opiate use (75 MME/Day) (Chronic)   Opioid-induced constipation (OIC) (Chronic)     Low   Disturbance of skin sensation (Chronic)   Relevant Orders   Vitamin B12   Inguinal hernia without obstruction (Right)   Logorrhea (Chronic)   Mediastinal adenopathy   OP (osteoporosis)   Relevant Orders   25-Hydroxyvitamin D Lcms D2+D3   Pulmonary nodule (7 mm right lower lobe)   Restless leg syndrome (Chronic)     Unprioritized   Anemia, chronic disease    Other Visit Diagnoses    Wedge compression fracture of second lumbar vertebra, sequela       Relevant Orders   25-Hydroxyvitamin D Lcms D2+D3      Pharmacotherapy (Medications Ordered): No orders of the defined types were placed in this encounter.   Lab-work & Procedure Ordered: Orders Placed This Encounter  Procedures  . DG HIP UNILAT W OR W/O PELVIS 2-3 VIEWS LEFT  . DG HIP UNILAT W OR W/O PELVIS 2-3 VIEWS RIGHT  . DG Knee 1-2 Views Left  . DG Knee 1-2 Views Right  . DG Shoulder Left  . DG Shoulder Right  . Compliance Drug Analysis, Ur  . Comprehensive metabolic panel  . C-reactive protein  . Magnesium  .  Sedimentation rate  . Vitamin B12  . 25-Hydroxyvitamin D Lcms D2+D3  . Drug Screen 10 W/Conf, Serum  . Ambulatory referral to Psychology  . NCV with EMG(electromyography)    Interventional Therapies: Scheduled:  None at this time.    Considering:  None at this time.    PRN Procedures:  None at this time.    Referral(s) or Consult(s): Medical psychology consult for substance use disorder evaluation  Medications administered during this visit: Mark [name] had no medications administered during this visit.  Prescriptions ordered during this visit: New Prescriptions   No medications on file    Requested PM Follow-up: Return for After [facility] Eval.  Future Appointments Date Time Provider [place]  05/17/2016 3:15 PM Robert Bellow, MD [facility] None     Primary Care Physician: Sandra Cockayne, MD Location: [facility] Note by: Kathlen Brunswick. Dossie Arbour, M.D, DABA, DABAPM, DABPM, DABIPP, FIPP  Pain Score Disclaimer: We use the NRS-11 scale. This is a self-reported, subjective measurement of pain severity with only modest accuracy. It is used primarily to identify changes within a particular patient. It must be understood that outpatient pain scales are significantly less accurate that those used for research, where they can be applied under ideal controlled circumstances with minimal exposure to variables. In reality, the score is likely to be a combination of pain intensity and pain affect, where pain affect describes the degree of emotional arousal or changes in action readiness caused by the sensory experience of pain. Factors such as social and work situation, setting, emotional state, anxiety levels, expectation, and prior  pain experience may influence pain perception and show large inter-individual differences that may also be affected by time variables.  Patient instructions provided during this appointment: There are no Patient  Instructions on file for this visit.

## 2016-05-06 LAB — CULTURE, BLOOD (ROUTINE X 2)
Culture: NO GROWTH
Culture: NO GROWTH

## 2016-05-06 LAB — C-REACTIVE PROTEIN: CRP: 2.4 mg/dL — AB (ref <1.0)

## 2016-05-06 LAB — VITAMIN B12: VITAMIN B 12: 211 pg/mL (ref 180–914)

## 2016-05-09 NOTE — Progress Notes (Signed)
Normal levels of C-Reactive Protein for our Lab are less than 1.0 mg/L. C-reactive protein (CRP) is produced by the liver. The level of CRP rises when there is inflammation throughout the body. CRP goes up in response to inflammation. High levels suggests the presence of chronic inflammation but do not identify its location or cause. High levels have been observed in obese patients, individuals with bacterial infections, chronic inflammation, or flare-ups of inflammatory conditions. Drops of previously elevated levels suggest that the inflammation or infection is subsiding and/or responding to treatment.A normal sedimentation rate should be below 30 mm/hr. The sed rate is an acute phase reactant that indirectly measures the degree of inflammation present in the body. It can be acute, developing rapidly after trauma, injury or infection, for example, or can occur over an extended time (chronic) with conditions such as autoimmune diseases or cancer. The ESR is not diagnostic; it is a non-specific, screening test that may be elevated in a number of these different conditions. It provides general information about the presence or absence of an inflammatory condition.The combined elevation of the ESR & CRP, may be suggestive of an autoimmune disease. Should this be the case, we will inquire if the patient has had a rheumatologic evaluation looking at  the RF levels, ANA levels, and CBC.

## 2016-05-09 NOTE — Progress Notes (Signed)
Critical Potassium Levels  Potassium levels below 3.6 mmol/L are considered to be low. Levels (less than 2.5 mmol/L) can be life-threatening and requires urgent medical attention. Low potassium (hypokalemia) has many causes. The most common cause is excessive potassium loss in urine due to prescription water or fluid pills (diuretics). Vomiting or diarrhea or both can result in excessive potassium loss from the digestive tract. Causes of potassium loss leading to low potassium include: chronic kidney disease; diabetic ketoacidosis; diarrhea; excessive alcohol use; excessive laxative use; excessive sweating; folic acid deficiency; diuretics; primary aldosteronism; vomiting; and/or some antibiotic use.  Normal chloride levels are between 95 and 107 mEq/L. Low levels may be due to: Addison disease; Bartter syndrome; burns; congestive heart failure; dehydration; excessive sweating; hyperaldosteronism; metabolic alkalosis; respiratory acidosis (compensated); Syndrome of inappropriate diuretic hormone secretion (SIADH); or vomiting.  Most of the CO2 in the body is in the form of bicarbonate (HCO3-). Therefore, the CO2 blood test is really a measure of bicarbonate levels. kidneys help maintain the normal bicarbonate levels. The normal range is between 22 and 28 mEq/L, for our Lab. Higher levels may suggest alkalosis; renal tubular acidosis; breathing disorders; cushing syndrome; and hyperaldosteronism among others.  Normal fasting (NPO x 8 hours) glucose levels are between 65-99 mg/dl, with 2 hour fasting, levels are usually less than 140 mg/dl. Any random blood glucose level greater than 200 mg/dl is considered to be Diabetes.  Levels of albumin may decrease when conditions interfere with its production, increase protein breakdown, increase protein loss, and/or expand plasma volume (diluting the blood).  A low albumin may suggest liver disease.  Low albumin levels can reflect diseases in which the kidneys  cannot prevent albumin from leaking from the blood into the urine and being lost.  Low albumin levels can also be seen in inflammation, shock, and malnutrition. They may be seen with conditions in which the body does not properly absorb and digest protein, such as Crohns disease or celiac disease, or in which large volumes of protein are lost from the intestines.  A low albumin may also be seen in several other conditions, such as:  Infection Burns Surgery Chronic illness Cancer Diabetes Hypothyroidism Carcinoid syndrome Plasma volume expansion due to congestive heart failure, sometimes pregnancy.  Low levels of AST in the blood are expected and may be normal.  AST levels are often compared with results of other tests such as alkaline phosphatase (ALP), total protein, and bilirubin to help determine which form of liver disease is present.  AST is often measured to monitor treatment of persons with liver disease and may be ordered either by itself or along with other tests for this purpose.  Sometimes AST may be used to monitor people who are taking medications that are potentially toxic to the liver. If AST levels increase, then the person may be switched to another medication.  AST is compared directly to ALT and an AST/ALT ratio is calculated. This ratio may be used to distinguish between different causes of liver damage and to distinguish liver injury from damage to heart or muscle.  While most low ALT level results indicate a normal healthy liver, that may not always be the case. A low-functioning or non-functioning liver, lacking normal levels of ALT activity to begin with, would not release a lot of ALT into the blood when damaged. People infected with the hepatitis C virus initially show high ALT levels in their blood, but these levels fall over time. Because the ALT test measures ALT levels  at only one point in time, people with chronic hepatitis C infection may already have  experienced the ALT peak well before blood was drawn for the ALT test. Urinary tract infections or malnutrition may also cause low blood ALT levels.

## 2016-05-10 ENCOUNTER — Other Ambulatory Visit: Payer: Self-pay

## 2016-05-10 LAB — 25-HYDROXYVITAMIN D LCMS D2+D3: 25-HYDROXY, VITAMIN D: 38 ng/mL

## 2016-05-10 LAB — 25-HYDROXY VITAMIN D LCMS D2+D3
25-Hydroxy, Vitamin D-2: <1 ng/mL
25-Hydroxy, Vitamin D-3: 38 ng/mL

## 2016-05-11 NOTE — Progress Notes (Signed)
Lab results faxed to Dr Ricki Rodriguez

## 2016-05-12 LAB — BENZODIAZEPINES,MS,WB/SP RFX
7-Aminoclonazepam: 27 ng/mL
Alprazolam: NEGATIVE ng/mL
BENZODIAZEPINES CONFIRM: POSITIVE
CHLORDIAZEPOXIDE: NEGATIVE ng/mL
CLONAZEPAM: 31 ng/mL
DESALKYLFLURAZEPAM: NEGATIVE ng/mL
DESMETHYLCHLORDIAZEPOXIDE: NEGATIVE ng/mL
DIAZEPAM: NEGATIVE ng/mL
Desmethyldiazepam: NEGATIVE ng/mL
Flurazepam: NEGATIVE ng/mL
LORAZEPAM: NEGATIVE ng/mL
MIDAZOLAM: NEGATIVE ng/mL
Oxazepam: NEGATIVE ng/mL
TRIAZOLAM: NEGATIVE ng/mL
Temazepam: NEGATIVE ng/mL

## 2016-05-14 LAB — OPIATES,MS,WB/SP RFX
6-Acetylmorphine: NEGATIVE
Codeine: NEGATIVE ng/mL
DIHYDROCODEINE: NEGATIVE ng/mL
Hydrocodone: NEGATIVE ng/mL
Hydromorphone: NEGATIVE ng/mL
MORPHINE: NEGATIVE ng/mL
Opiate Confirmation: NEGATIVE

## 2016-05-14 LAB — DRUG SCREEN 10 W/CONF, SERUM
Amphetamines, IA: NEGATIVE ng/mL
BENZODIAZEPINES, IA: POSITIVE ng/mL
Barbiturates, IA: NEGATIVE ug/mL
COCAINE & METABOLITE, IA: NEGATIVE ng/mL
Methadone, IA: NEGATIVE ng/mL
OPIATES, IA: NEGATIVE ng/mL
OXYCODONES, IA: POSITIVE ng/mL
PHENCYCLIDINE, IA: NEGATIVE ng/mL
Propoxyphene, IA: NEGATIVE ng/mL
THC(Marijuana) Metabolite, IA: NEGATIVE ng/mL

## 2016-05-14 LAB — OXYCODONES,MS,WB/SP RFX
OXYCOCONE: 18.5 ng/mL
OXYCODONES CONFIRMATION: POSITIVE
OXYMORPHONE: NEGATIVE ng/mL

## 2016-05-17 ENCOUNTER — Ambulatory Visit: Payer: Medicare Other | Admitting: General Surgery

## 2016-06-16 ENCOUNTER — Encounter: Payer: Self-pay | Admitting: *Deleted

## 2016-10-24 DIAGNOSIS — G8929 Other chronic pain: Secondary | ICD-10-CM | POA: Insufficient documentation

## 2016-10-24 DIAGNOSIS — M5442 Lumbago with sciatica, left side: Secondary | ICD-10-CM

## 2016-10-24 DIAGNOSIS — G894 Chronic pain syndrome: Secondary | ICD-10-CM | POA: Insufficient documentation

## 2016-10-24 DIAGNOSIS — M5441 Lumbago with sciatica, right side: Secondary | ICD-10-CM

## 2016-10-24 NOTE — Progress Notes (Deleted)
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 (SYMBICORT) 160-4.5 MCG/ACT inhaler Inhale 2 puffs into the lungs 2 (two) times daily.  . cholecalciferol (VITAMIN D) 1000 units tablet Take 1,000 Units by mouth daily.  . clonazePAM (KLONOPIN) 1 MG tablet Take 1 mg by mouth 2 (two) times daily.  . cyclobenzaprine (FLEXERIL) 10 MG tablet Take 10 mg by mouth 3 (three) times daily as needed for muscle spasms.  Marland Kitchen docusate sodium (COLACE) 100 MG capsule Take 100 mg by mouth daily.  . Fluticasone-Salmeterol (ADVAIR) 500-50 MCG/DOSE AEPB Inhale 1 puff into the lungs 2 (two) times daily.  Marland Kitchen gabapentin (NEURONTIN) 800 MG tablet Take 800 mg by mouth 3 (three) times daily.  Marland Kitchen guaiFENesin-dextromethorphan (ROBITUSSIN DM) 100-10 MG/5ML syrup Take 5 mLs by mouth every 4 (four) hours as needed for cough.  Marland Kitchen ibuprofen (ADVIL,MOTRIN) 200 MG  tablet Take 200 mg by  mouth every 8 (eight) hours as needed. 2 tabs  . ipratropium (ATROVENT HFA) 17 MCG/ACT inhaler Inhale 2 puffs into the lungs every 6 (six) hours.  Marland Kitchen levofloxacin (LEVAQUIN) 500 MG tablet Take 1 tablet (500 mg total) by mouth daily.  Marland Kitchen levothyroxine (SYNTHROID, LEVOTHROID) 25 MCG tablet Take 25 mcg by mouth daily before breakfast.  . Oxycodone HCl 10 MG TABS Take 1 tablet (10 mg total) by mouth every 4 (four) hours as needed.  . predniSONE (STERAPRED UNI-PAK 21 TAB) 10 MG (21) TBPK tablet Take 1 tablet (10 mg total) by mouth daily. 34m x1 day, 266mx2 day, 1033m2 day then stop  . rOPINIRole (REQUIP) 1 MG tablet Take 1 mg by mouth at bedtime.   No current facility-administered medications on file prior to visit.    ROS  Constitutional: Denies any fever or chills Gastrointestinal: No reported hemesis, hematochezia, vomiting, or acute GI distress Musculoskeletal: Denies any acute onset joint swelling, redness, loss of ROM, or weakness Neurological: No reported episodes of acute onset apraxia, aphasia, dysarthria, agnosia, amnesia, paralysis, loss of coordination, or loss of consciousness  Allergies  Mark Mark No Known Allergies.  PFSBlue Lakerug: Mark Mark that he does not use drugs. Alcohol:  reports that he does not drink alcohol. Tobacco:  reports that he has been smoking.  He has never used smokeless tobacco. Medical:  has a past medical history of Asthma; COPD (chronic obstructive pulmonary disease[place]. Family: family history is not on file.  Past Surgical History:  Procedure Laterality Date  . BACK SURGERY     Constitutional Exam  General appearance: Well nourished, well developed, and well hydrated. In no apparent acute distress There were no vitals filed for this visit. BMI Assessment: Estimated body mass index is 24.43 kg/m as calculated from the following:   Height as of 05/05/16: '5\' 7"'  (1.702 m).   Weight as of  05/05/16: 156 lb (70.8 kg).  BMI interpretation table: BMI level Category Range association with higher incidence of chronic pain  <18 kg/m2 Underweight   18.5-24.9 kg/m2 Ideal body weight   25-29.9 kg/m2 Overweight Increased incidence by 20%  30-34.9 kg/m2 Obese (Class I) Increased incidence by 68%  35-39.9 kg/m2 Severe obesity (Class II) Increased incidence by 136%  >40 kg/m2 Extreme obesity (Class III) Increased incidence by 254%   BMI Readings from Last 4 Encounters:  05/05/16 24.43 kg/m  05/03/16 21.16 kg/m  11/27/15 25.85 kg/m  06/29/15 34.37 kg/m   Wt Readings from Last 4 Encounters:  05/05/16 156 lb (70.8 kg)  05/03/16 156 lb (70.8 kg)  11/27/15 170 lb (77.1 kg)  06/29/15 176 lb (79.8 kg)  Psych/Mental status: Alert, oriented x 3 (person, place, & time)       Eyes: PERLA Respiratory: No evidence of acute respiratory distress  Cervical Spine Exam  Inspection: No masses, redness, or swelling Alignment: Symmetrical Functional ROM: Unrestricted ROM Stability: No instability detected Muscle strength & Tone: Functionally intact Sensory: Unimpaired Palpation: Non-contributory  Upper Extremity (UE) Exam    Side: Right upper extremity  Side: Left upper extremity  Inspection: No masses, redness, swelling, or asymmetry  Inspection: No masses, redness, swelling, or asymmetry  Functional ROM: Unrestricted ROM          Functional ROM: Unrestricted ROM          Muscle strength & Tone: Functionally intact  Muscle strength & Tone: Functionally intact  Sensory: Unimpaired  Sensory: Unimpaired  Palpation: Non-contributory  Palpation: Non-contributory   Thoracic Spine Exam  Inspection: No masses, redness, or swelling Alignment: Symmetrical Functional ROM: Unrestricted ROM Stability: No instability detected Sensory: Unimpaired Muscle strength & Tone: Functionally intact Palpation: Non-contributory  Lumbar Spine Exam  Inspection: No masses, redness, or swelling Alignment:  Symmetrical Functional ROM: Unrestricted ROM Stability: No instability detected Muscle strength & Tone: Functionally intact Sensory: Unimpaired Palpation: Non-contributory Provocative Tests: Lumbar Hyperextension and rotation test: evaluation deferred today       Patrick's Maneuver: evaluation deferred today              Gait & Posture Assessment  Ambulation: Unassisted Gait: Relatively normal for age and body habitus Posture: WNL   Lower Extremity Exam    Side: Right lower extremity  Side: Left lower extremity  Inspection: No masses, redness, swelling, or asymmetry  Inspection: No masses, redness, swelling, or asymmetry  Functional ROM: Unrestricted ROM          Functional ROM: Unrestricted ROM          Muscle strength & Tone: Functionally intact  Muscle strength & Tone: Functionally intact  Sensory: Unimpaired  Sensory: Unimpaired  Palpation: Non-contributory  Palpation: Non-contributory   Assessment & Plan  Primary Diagnosis & Pertinent Problem List: The primary encounter diagnosis was Chronic lower extremity pain (Bilateral) (L>R). Diagnoses of Chronic pain syndrome, Chronic back pain (Location of Primary Source of Pain) (Bilateral) (R>L), Chronic hip pain, unspecified laterality, Cervical facet syndrome (Bilateral) (L>R), Lumbar facet syndrome (Bilateral) (R>L), Sacral back pain (Location of Secondary source of pain) (Bilateral) (L>R), Long term current use of opiate analgesic, and Opiate use (75 MME/Day) were also pertinent to this visit.  Visit Diagnosis: 1. Chronic lower extremity pain (Bilateral) (L>R)   2. Chronic pain syndrome   3. Chronic back pain (Location of Primary Source of Pain) (Bilateral) (R>L)   4. Chronic hip pain, unspecified laterality   5. Cervical facet syndrome (Bilateral) (L>R)   6. Lumbar facet syndrome (Bilateral) (R>L)   7. Sacral back pain (Location of Secondary source of pain) (Bilateral) (L>R)   8. Long term current use of opiate analgesic   9.  Opiate use (75 MME/Day)    Problems updated and reviewed during this visit: Problem  Chronic Pain Syndrome  Chronic back pain (Location of Primary Source of Pain) (Bilateral) (R>L)  Chronic lower extremity pain (Bilateral) (L>R)  Pain Medication Agreement Broken   Overview:  Patient's urine toxscreen was positive for marijuana    Problem-specific Plan(s): No problem-specific Assessment & Plan notes found for this encounter.  Assessment & plan notes cannot be loaded without a specified hospital service.  Plan of Care  Pharmacotherapy (Medications Ordered): No orders of the defined types were placed in this encounter.  Lab-work, procedure(s), and/or referral(s): No orders of the defined types were placed in this encounter.   Pharmacotherapy: Opioid Analgesics: We'll take over management today. See above orders Membrane stabilizer: We have discussed the possibility of optimizing this mode of therapy, if tolerated Muscle relaxant: We have discussed the possibility of a trial NSAID: We have discussed the possibility of a trial Other analgesic(s): To be determined at a later time   Interventional therapies: Planned, scheduled, and/or pending:    ***   Considering:   ***   PRN Procedures:   To be determined at a later time   Provider-requested follow-up: No Follow-up on file.  Future Appointments Date Time Provider [place]  10/25/2016 1:30 PM Milinda Pointer, MD [facility] None    Primary  Care Physician: Sandra Cockayne, MD Location: [facility] Note by: Kathlen Brunswick. Dossie Arbour, M.D, DABA, DABAPM, DABPM, DABIPP, FIPP Date: 10/25/2016; Time: 11:02 PM  Pain Score Disclaimer: We use the NRS-11 scale. This is a self-reported, subjective measurement of pain severity with only modest accuracy. It is used primarily to identify changes within a particular patient. It must be understood that outpatient pain scales are significantly less  accurate that those used for research, where they can be applied under ideal controlled circumstances with minimal exposure to variables. In reality, the score is likely to be a combination of pain intensity and pain affect, where pain affect describes the degree of emotional arousal or changes in action readiness caused by the sensory experience of pain. Factors such as social and work situation, setting, emotional state, anxiety levels, expectation, and prior pain experience may influence pain perception and show large inter-individual differences that may also be affected by time variables.  Patient instructions provided during this appointment: There are no Patient Instructions on file for this visit.

## 2016-10-25 ENCOUNTER — Ambulatory Visit: Payer: Medicare Other | Admitting: Pain Medicine

## 2016-11-22 ENCOUNTER — Ambulatory Visit: Payer: Medicare Other | Attending: Pain Medicine | Admitting: Pain Medicine

## 2016-11-22 ENCOUNTER — Encounter: Payer: Self-pay | Admitting: Pain Medicine

## 2016-11-22 NOTE — Progress Notes (Deleted)
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 which includes the following prescription(s): albuterol, aspirin, budesonide-formoterol, cholecalciferol, clonazepam, cyclobenzaprine, docusate sodium, fluticasone-salmeterol, gabapentin, guaifenesin-dextromethorphan, ibuprofen, ipratropium, levofloxacin, levothyroxine, oxycodone hcl, prednisone, and ropinirole.  Current Outpatient Prescriptions on File Prior to Visit  Medication Sig  . albuterol (PROVENTIL HFA;VENTOLIN HFA) 108 (90 Base) MCG/ACT inhaler Inhale 2 puffs into the lungs every 4 (four) hours as needed for wheezing.  Marland Kitchen aspirin 81 MG tablet Take 81 mg by mouth daily.  . budesonide-formoterol (SYMBICORT) 160-4.5 MCG/ACT inhaler Inhale 2 puffs into the lungs 2 (two) times daily.  . cholecalciferol  (VITAMIN D) 1000 units tablet Take 1,000 Units by mouth daily.  . clonazePAM (  KLONOPIN) 1 MG tablet Take 1 mg by mouth 2 (two) times daily.  . cyclobenzaprine (FLEXERIL) 10 MG tablet Take 10 mg by mouth 3 (three) times daily as needed for muscle spasms.  Marland Kitchen docusate sodium (COLACE) 100 MG capsule Take 100 mg by mouth daily.  . Fluticasone-Salmeterol (ADVAIR) 500-50 MCG/DOSE AEPB Inhale 1 puff into the lungs 2 (two) times daily.  Marland Kitchen gabapentin (NEURONTIN) 800 MG tablet Take 800 mg by mouth 3 (three) times daily.  Marland Kitchen guaiFENesin-dextromethorphan (ROBITUSSIN DM) 100-10 MG/5ML syrup Take 5 mLs by mouth every 4 (four) hours as needed for cough.  Marland Kitchen ibuprofen (ADVIL,MOTRIN) 200 MG tablet Take 200 mg by mouth every 8 (eight) hours as needed. 2 tabs  . ipratropium (ATROVENT HFA) 17 MCG/ACT inhaler Inhale 2 puffs into the lungs every 6 (six) hours.  Marland Kitchen levofloxacin (LEVAQUIN) 500 MG tablet Take 1 tablet (500 mg total) by mouth daily.  Marland Kitchen levothyroxine (SYNTHROID, LEVOTHROID) 25 MCG tablet Take 25 mcg by mouth daily before breakfast.  . Oxycodone HCl 10 MG TABS Take 1 tablet (10 mg total) by mouth every 4 (four) hours as needed.  . predniSONE (STERAPRED UNI-PAK 21 TAB) 10 MG (21) TBPK tablet Take 1 tablet (10 mg total) by mouth daily. 61m x1 day, 251mx2 day, 1010m2 day then stop  . rOPINIRole (REQUIP) 1 MG tablet Take 1 mg by mouth at bedtime.   No current facility-administered medications on file prior to visit.    ROS  Constitutional: Denies any fever or chills Gastrointestinal: No reported hemesis, hematochezia, vomiting, or acute GI distress Musculoskeletal: Denies any acute onset joint swelling, redness, loss of ROM, or weakness Neurological: No reported episodes of acute onset apraxia, aphasia, dysarthria, agnosia, amnesia, paralysis, loss of coordination, or loss of consciousness  Allergies  Mark [name] No Known Allergies.  PFSLunarug: Mark [name] that he does not use  drugs. Alcohol:  reports that he does not drink alcohol. Tobacco:  reports that he has been smoking.  He has never used smokeless tobacco. Medical:  has a past medical history of Acute respiratory failure with hypoxia and hypercapnia (HCCDescanso7/30/2017); Asthma; COPD (chronic obstructive pulmonary disease) (HCCShivelyand Osteoporosis. Family: family history is not on file.  Past Surgical History:  Procedure Laterality Date  . BACK SURGERY     Constitutional Exam  General appearance: Well nourished, well developed, and well hydrated. In no apparent acute distress There were no vitals filed for this visit. BMI Assessment: Estimated body mass index is 24.43 kg/m as calculated from the following:   Height as of 05/05/16: '5\' 7"'  (1.702 m).   Weight as of 05/05/16: 156 lb (70.8 kg).  BMI interpretation table: BMI level Category Range association with higher incidence of chronic pain  <18 kg/m2 Underweight   18.5-24.9 kg/m2 Ideal body weight   25-29.9 kg/m2 Overweight Increased incidence by 20%  30-34.9 kg/m2 Obese (Class I) Increased incidence by 68%  35-39.9 kg/m2 Severe obesity (Class II) Increased incidence by 136%  >40 kg/m2 Extreme obesity (Class III) Increased incidence by 254%   BMI Readings from Last 4 Encounters:  05/05/16 24.43 kg/m  05/03/16 21.16 kg/m  11/27/15 25.85 kg/m  06/29/15 34.37 kg/m   Wt Readings from Last 4 Encounters:  05/05/16 156 lb (70.8 kg)  05/03/16 156 lb (70.8 kg)  11/27/15 170 lb (77.1 kg)  06/29/15 176 lb (79.8 kg)  Psych/Mental status: Alert, oriented x 3 (person, place, & time)  Eyes: PERLA Respiratory: No evidence of acute respiratory distress  Cervical Spine Exam  Inspection: No masses, redness, or swelling Alignment: Symmetrical Functional ROM: Unrestricted ROM Stability: No instability detected Muscle strength & Tone: Functionally intact Sensory: Unimpaired Palpation: Non-contributory  Upper Extremity (UE) Exam    Side: Right upper  extremity  Side: Left upper extremity  Inspection: No masses, redness, swelling, or asymmetry. No contractures  Inspection: No masses, redness, swelling, or asymmetry. No contractures  Functional ROM: Unrestricted ROM          Functional ROM: Unrestricted ROM          Muscle strength & Tone: Functionally intact  Muscle strength & Tone: Functionally intact  Sensory: Unimpaired  Sensory: Unimpaired  Palpation: Euthermic  Palpation: Euthermic  Specialized Test(s): Deferred         Specialized Test(s): Deferred          Thoracic Spine Exam  Inspection: No masses, redness, or swelling Alignment: Symmetrical Functional ROM: Unrestricted ROM Stability: No instability detected Sensory: Unimpaired Muscle strength & Tone: Functionally intact Palpation: Non-contributory  Lumbar Spine Exam  Inspection: No masses, redness, or swelling Alignment: Symmetrical Functional ROM: Unrestricted ROM Stability: No instability detected Muscle strength & Tone: Functionally intact Sensory: Unimpaired Palpation: Non-contributory Provocative Tests: Lumbar Hyperextension and rotation test: evaluation deferred today       Patrick's Maneuver: evaluation deferred today              Gait & Posture Assessment  Ambulation: Unassisted Gait: Relatively normal for age and body habitus Posture: WNL   Lower Extremity Exam    Side: Right lower extremity  Side: Left lower extremity  Inspection: No masses, redness, swelling, or asymmetry. No contractures  Inspection: No masses, redness, swelling, or asymmetry. No contractures  Functional ROM: Unrestricted ROM          Functional ROM: Unrestricted ROM          Muscle strength & Tone: Functionally intact  Muscle strength & Tone: Functionally intact  Sensory: Unimpaired  Sensory: Unimpaired  Palpation: No palpable anomalies  Palpation: No palpable anomalies   Assessment & Plan  Primary Diagnosis & Pertinent Problem List: The primary encounter diagnosis was Chronic back  pain (Location of Primary Source of Pain) (Bilateral) (R>L). Diagnoses of Sacral back pain (Location of Secondary source of pain) (Bilateral) (L>R), Chronic hip pain, unspecified laterality, and Logorrhea were also pertinent to this visit.  Visit Diagnosis: 1. Chronic back pain (Location of Primary Source of Pain) (Bilateral) (R>L)   2. Sacral back pain (Location of Secondary source of pain) (Bilateral) (L>R)   3. Chronic hip pain, unspecified laterality   4. Logorrhea    Problems updated and reviewed during this visit: Problem  Pain Medication Agreement Broken   Overview:  Patient's urine toxscreen was positive for marijuana   Anemia, Chronic Disease   Prosily iron deficiency anemia or pernicious anemia.   Acquired Atrophy of Thyroid  Clinical Depression  Esophagitis, Reflux   Overview:  DIAGNOSED BY ENDOSCOPY - 2004.   Anxiety State   Last Assessment & Plan:  Continues with agoraphobia and anxiety. Taking clonazapem 50m twice daily.   PLAN: refilled clonazapem #60 with 5 refills   Acute Respiratory Failure With Hypoxia and Hypercapnia (Hcc) (Resolved)   Problem-specific Plan(s): No problem-specific Assessment & Plan notes found for this encounter.  Assessment & plan notes cannot be loaded without a specified hospital service.  Plan of Care  Pharmacotherapy (Medications Ordered): No orders of the defined types were  placed in this encounter.  Lab-work, procedure(s), and/or referral(s): No orders of the defined types were placed in this encounter.   Pharmacotherapy: Opioid Analgesics: We'll take over management today. See above orders Membrane stabilizer: We have discussed the possibility of optimizing this mode of therapy, if tolerated Muscle relaxant: We have discussed the possibility of a trial NSAID: We have discussed the possibility of a trial Other analgesic(s): To be determined at a later time   Interventional therapies: Planned, scheduled, and/or pending:     ***   Considering:   ***   PRN Procedures:   To be determined at a later time   Provider-requested follow-up: No Follow-up on file.  Future Appointments Date Time Provider [place]  11/22/2016 1:30 PM Milinda Pointer, MD [facility] None    Primary Care Physician: Sandra Cockayne, MD Location: [facility] Note by: Kathlen Brunswick. Dossie Arbour, M.D, DABA, DABAPM, DABPM, DABIPP, FIPP Date: 11/22/2016; Time: 12:00 PM  Pain Score Disclaimer: We use the NRS-11 scale. This is a self-reported, subjective measurement of pain severity with only modest accuracy. It is used primarily to identify changes within a particular patient. It must be understood that outpatient pain scales are significantly less accurate that those used for research, where they can be applied under ideal controlled circumstances with minimal exposure to variables. In reality, the score is likely to be a combination of pain intensity and pain affect, where pain affect describes the degree of emotional arousal or changes in action readiness caused by the sensory experience of pain. Factors such as social and work situation, setting, emotional state, anxiety levels, expectation, and prior pain experience may influence pain perception and show large inter-individual differences that may also be affected by time variables.  Patient instructions provided during this appointment: There are no Patient Instructions on file for this visit.

## 2016-12-20 ENCOUNTER — Ambulatory Visit: Payer: Medicare Other | Admitting: Pain Medicine

## 2017-01-10 ENCOUNTER — Encounter: Payer: Self-pay | Admitting: Pain Medicine

## 2017-01-10 ENCOUNTER — Ambulatory Visit: Payer: Medicare Other | Attending: Pain Medicine | Admitting: Pain Medicine

## 2017-01-10 VITALS — BP 137/85 | HR 115 | Temp 98.0°F | Resp 20 | Ht 67.0 in | Wt 160.0 lb

## 2017-01-10 DIAGNOSIS — Z9119 Patient's noncompliance with other medical treatment and regimen: Secondary | ICD-10-CM | POA: Insufficient documentation

## 2017-01-10 DIAGNOSIS — M4856XS Collapsed vertebra, not elsewhere classified, lumbar region, sequela of fracture: Secondary | ICD-10-CM | POA: Insufficient documentation

## 2017-01-10 DIAGNOSIS — R7982 Elevated C-reactive protein (CRP): Secondary | ICD-10-CM | POA: Insufficient documentation

## 2017-01-10 DIAGNOSIS — R700 Elevated erythrocyte sedimentation rate: Secondary | ICD-10-CM | POA: Diagnosis not present

## 2017-01-10 DIAGNOSIS — F1190 Opioid use, unspecified, uncomplicated: Secondary | ICD-10-CM

## 2017-01-10 DIAGNOSIS — Z79899 Other long term (current) drug therapy: Secondary | ICD-10-CM | POA: Insufficient documentation

## 2017-01-10 DIAGNOSIS — G894 Chronic pain syndrome: Secondary | ICD-10-CM | POA: Insufficient documentation

## 2017-01-10 DIAGNOSIS — Z87898 Personal history of other specified conditions: Secondary | ICD-10-CM

## 2017-01-10 DIAGNOSIS — Z7982 Long term (current) use of aspirin: Secondary | ICD-10-CM | POA: Insufficient documentation

## 2017-01-10 DIAGNOSIS — E876 Hypokalemia: Secondary | ICD-10-CM | POA: Insufficient documentation

## 2017-01-10 DIAGNOSIS — M5442 Lumbago with sciatica, left side: Secondary | ICD-10-CM

## 2017-01-10 DIAGNOSIS — G8929 Other chronic pain: Secondary | ICD-10-CM

## 2017-01-10 DIAGNOSIS — F1290 Cannabis use, unspecified, uncomplicated: Secondary | ICD-10-CM | POA: Diagnosis not present

## 2017-01-10 DIAGNOSIS — Z79891 Long term (current) use of opiate analgesic: Secondary | ICD-10-CM | POA: Diagnosis not present

## 2017-01-10 DIAGNOSIS — M25551 Pain in right hip: Secondary | ICD-10-CM | POA: Insufficient documentation

## 2017-01-10 DIAGNOSIS — Z9114 Patient's other noncompliance with medication regimen: Secondary | ICD-10-CM

## 2017-01-10 DIAGNOSIS — M25552 Pain in left hip: Secondary | ICD-10-CM | POA: Insufficient documentation

## 2017-01-10 DIAGNOSIS — Z720 Tobacco use: Secondary | ICD-10-CM | POA: Insufficient documentation

## 2017-01-10 DIAGNOSIS — M533 Sacrococcygeal disorders, not elsewhere classified: Secondary | ICD-10-CM | POA: Diagnosis not present

## 2017-01-10 DIAGNOSIS — S32020S Wedge compression fracture of second lumbar vertebra, sequela: Secondary | ICD-10-CM

## 2017-01-10 DIAGNOSIS — M5441 Lumbago with sciatica, right side: Secondary | ICD-10-CM

## 2017-01-10 DIAGNOSIS — F1291 Cannabis use, unspecified, in remission: Secondary | ICD-10-CM

## 2017-01-10 DIAGNOSIS — R4789 Other speech disturbances: Secondary | ICD-10-CM

## 2017-01-10 DIAGNOSIS — M25559 Pain in unspecified hip: Secondary | ICD-10-CM

## 2017-01-10 NOTE — Progress Notes (Signed)
Safety precautions to be maintained throughout the outpatient stay will include: orient to surroundings, keep bed in low position, maintain call bell within reach at all times, provide assistance with transfer out of bed and ambulation.  

## 2017-01-10 NOTE — Progress Notes (Signed)
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   Narrative CLINICAL DATA:  Bilateral chronic knee pain.  EXAM: LEFT KNEE - 1-2 VIEW  COMPARISON:  None.  FINDINGS: No evidence of fracture, dislocation, or joint effusion. No notable spurring or joint narrowing. Atherosclerotic calcification. Osteopenic appearance.  IMPRESSION: 1. No focal finding or joint narrowing. 2. Atherosclerosis.   Electronically Signed   By: Monte Fantasia M.D.   On: 05/06/2016 08:25    Knee-R DG 4 views:  Results for orders placed in visit on 07/14/08  DG Knee Complete 4 Views Right   Narrative * PRIOR REPORT IMPORTED FROM AN EXTERNAL SYSTEM *   PRIOR REPORT IMPORTED FROM THE [facility] WORKFLOW SYSTEM   REASON FOR EXAM:    fall, pain  COMMENTS:   PROCEDURE:     DXR - DXR KNEE RT COMP WITH  OBLIQUES  -  Jul 14 2008 11:42PM   RESULT:     Images of the RIGHT knee demonstrate no definite fracture,  dislocation or radiopaque foreign body.   IMPRESSION:   No acute bony abnormality evident. If the patient has worsening or  persistent symptoms, repeat images in 7 to 10 days would be suggested. If  there is clinical evidence of internal derangement MRI followup would be  recommended.   Thank you for the opportunity to contribute to the care of your patient.       Note: Results of ordered imaging test(s) reviewed and explained to patient in Layman's terms. Copy of results provided to patient  Meds  The patient has a current medication list which includes the following prescription(s): albuterol, aspirin, budesonide-formoterol, cholecalciferol, clonazepam, cyclobenzaprine, docusate sodium, gabapentin, guaifenesin-dextromethorphan, ibuprofen, ipratropium, levothyroxine, and ropinirole.  Current Outpatient Prescriptions on File Prior to Visit  Medication Sig  . albuterol (PROVENTIL HFA;VENTOLIN HFA) 108 (90 Base) MCG/ACT inhaler Inhale 2 puffs into the lungs every 4 (four) hours as needed for wheezing.  Marland Kitchen aspirin 81 MG tablet Take 81 mg by mouth daily.  . budesonide-formoterol (SYMBICORT) 160-4.5 MCG/ACT inhaler Inhale 2 puffs into the lungs 2 (two) times daily.  . cholecalciferol (VITAMIN D) 1000 units tablet Take 1,000 Units by mouth daily.  . clonazePAM (KLONOPIN) 1 MG tablet Take 1 mg by mouth 2 (two) times daily.  . cyclobenzaprine (FLEXERIL) 10 MG tablet Take 10 mg by mouth 3 (three) times daily as needed for muscle spasms.  Marland Kitchen docusate sodium (COLACE) 100 MG capsule Take 100 mg by mouth daily.  Marland Kitchen gabapentin (NEURONTIN) 800 MG tablet Take 800 mg by mouth 3 (three) times daily.  Marland Kitchen guaiFENesin-dextromethorphan (ROBITUSSIN DM) 100-10 MG/5ML syrup Take 5 mLs by mouth every 4 (four) hours as needed for cough.  Marland Kitchen ibuprofen (ADVIL,MOTRIN) 200 MG tablet Take 200 mg by mouth every 8  (eight) hours as needed. 2 tabs  . ipratropium (ATROVENT HFA) 17 MCG/ACT inhaler Inhale 2 puffs into the lungs every 6 (six) hours.  Marland Kitchen levothyroxine (SYNTHROID, LEVOTHROID) 25 MCG tablet Take 25 mcg by mouth daily before breakfast.  . rOPINIRole (REQUIP) 1 MG tablet Take 1 mg by mouth at bedtime.   No current facility-administered medications on file prior to visit.    ROS  Constitutional: Denies any fever or chills Gastrointestinal: No reported hemesis, hematochezia, vomiting, or acute GI distress Musculoskeletal: Denies any acute onset joint swelling, redness, loss of ROM, or weakness Neurological: No reported episodes of acute onset apraxia, aphasia, dysarthria, agnosia, amnesia, paralysis, loss of coordination, or loss of consciousness  Allergies  Mr. Dykman has No Known Allergies.  [place]  Drug: Mr. Gunning  reports that he does not use drugs. Alcohol:  reports that he does not drink alcohol. Tobacco:  reports that he has been smoking.  He has never used smokeless tobacco. Medical:  has a past medical history of Acute respiratory failure with hypoxia and hypercapnia ([place]) (05/01/2016); Asthma; COPD (chronic obstructive pulmonary disease) ([place]); and Osteoporosis. Family: family history is not on file.  Past Surgical History:  Procedure Laterality Date  . BACK SURGERY     Constitutional Exam  General appearance: Well nourished, well developed, and well hydrated. In no apparent acute distress Vitals:   01/10/17 1432  BP: 137/85  Pulse: (!) 115  Resp: 20  Temp: 98 F (36.7 C)  TempSrc: Oral  SpO2: 97%  Weight: 160 lb (72.6 kg)  Height: 5' 7" (1.702 m)   BMI Assessment: Estimated  body mass index is 25.06 kg/m as calculated from the following:   Height as of this encounter: 5' 7" (1.702 m).   Weight as of this encounter: 160 lb (72.6 kg).  BMI interpretation table: BMI level Category Range association with higher incidence of chronic pain  <18 kg/m2 Underweight    18.5-24.9 kg/m2 Ideal body weight   25-29.9 kg/m2 Overweight Increased incidence by 20%  30-34.9 kg/m2 Obese (Class I) Increased incidence by 68%  35-39.9 kg/m2 Severe obesity (Class II) Increased incidence by 136%  >40 kg/m2 Extreme obesity (Class III) Increased incidence by 254%   BMI Readings from Last 4 Encounters:  01/10/17 25.06 kg/m  05/05/16 24.43 kg/m  05/03/16 21.16 kg/m  11/27/15 25.85 kg/m   Wt Readings from Last 4 Encounters:  01/10/17 160 lb (72.6 kg)  05/05/16 156 lb (70.8 kg)  05/03/16 156 lb (70.8 kg)  11/27/15 170 lb (77.1 kg)  Psych/Mental status: Alert, oriented x 3 (person, place, & time) Logorrhea Eyes: PERLA Respiratory: 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 No contractures  Inspection: No masses, redness, swelling, or asymmetry. No contractures  Functional ROM: Unrestricted ROM          Functional ROM: Unrestricted ROM          Muscle strength & Tone: Functionally intact  Muscle strength & Tone: Functionally intact  Sensory: Unimpaired  Sensory: Unimpaired  Palpation: No palpable anomalies  Palpation: No palpable anomalies  Specialized Test(s): Deferred         Specialized Test(s): Deferred          Thoracic Spine Exam  Inspection: increased thoracic Kyphosis Alignment: Symmetrical Functional ROM: Minimal ROM Stability: No instability detected Sensory: Unimpaired Muscle strength & Tone: No palpable anomalies  Lumbar Spine Exam  Inspection: No masses, redness, or swelling Alignment: Asymmetric Functional ROM:  Minimal ROM Stability: No instability detected Muscle strength & Tone: Increased muscle tone over affected area Sensory: Movement-associated discomfort Palpation: Complains of area being tender to palpation Provocative Tests: Lumbar Hyperextension and rotation test: evaluation deferred today       Patrick's Maneuver: evaluation deferred today              Gait & Posture Assessment  Ambulation: Patient ambulates using a walker Gait: Very limited, using assistive device to ambulate Posture: Thoracic kyphosis   Lower Extremity Exam    Side: Right lower extremity  Side: Left lower extremity  Inspection: No masses, redness, swelling, or asymmetry. No contractures  Inspection: No masses, redness, swelling, or asymmetry. No contractures  Functional ROM: Decreased ROM for all joints of the lower extremity  Functional ROM: Decreased ROM for all joints of the lower extremity  Muscle strength & Tone: Functionally intact  Muscle strength & Tone: Functionally intact  Sensory: Unimpaired  Sensory: Unimpaired  Palpation: No palpable anomalies  Palpation: No palpable anomalies   Assessment & Plan  Primary Diagnosis & Pertinent Problem List: The primary encounter diagnosis was Chronic back pain (Location of Primary Source of Pain) (Bilateral) (R>L). Diagnoses of Sacral back pain (Location of Secondary source of pain) (Bilateral) (L>R), Chronic hip pain, unspecified laterality, Long term prescription opiate use,  Opiate use (75 MME/Day), Hypokalemia, Chronic pain syndrome, Logorrhea, Elevated C-reactive protein (CRP), Elevated sed rate, Closed compression fracture of second lumbar vertebra, sequela, History of marijuana use, and History of broken pain medication agreement were also pertinent to this visit.  Visit Diagnosis: 1. Chronic back pain (Location of Primary Source of Pain) (Bilateral) (R>L)   2. Sacral back pain (Location of Secondary source of pain) (Bilateral) (L>R)   3. Chronic hip pain,  unspecified laterality   4. Long term prescription opiate use   5. Opiate use (75 MME/Day)   6. Hypokalemia   7. Chronic pain syndrome   8. Logorrhea   9. Elevated C-reactive protein (CRP)   10. Elevated sed rate   11. Closed compression fracture of second lumbar vertebra, sequela   12. History of marijuana use   13. History of broken pain medication agreement    Problems updated and reviewed during this visit: Problem  Chronic hip pain (Location of Tertiary source of pain) (Bilateral) (L>R)  Hypokalemia  Elevated C-Reactive Protein (Crp)  Elevated Sed Rate   Problem-specific Plan(s): No problem-specific Assessment & Plan notes found for this encounter.  Assessment & plan notes cannot be loaded without a specified hospital service.  Plan of Care  Pharmacotherapy (Medications Ordered): No orders of the defined types were placed in this encounter.  Lab-work, procedure(s), and/or referral(s): Orders Placed This Encounter  Procedures  . ToxASSURE Select 13 (MW), Urine  . C-reactive protein  . Comprehensive metabolic panel  . Sedimentation rate  . Ambulatory referral to Pain Clinic    Pharmacotherapy: Opioid Analgesics: I will not be prescribing any opioids at this time. The patient is not interested in that treatment plan offered. Membrane stabilizer: None prescribed at this time Muscle relaxant: None prescribed at this time NSAID: None prescribed at this time Other analgesic(s): None prescribed at this time   Interventional therapies: Planned, scheduled, and/or pending:    None at this time. The patient has expressed his interest in not seen involved in any interventional therapies.    Considering:   Diagnostic bilateral lumbar facet blocks  Diagnostic lumbar epidural steroid injections  Diagnostic intra-articular hip joint injections  Diagnostic bilateral cervical facet blocks  Diagnostic cervical epidural steroid injections  Diagnostic bilateral intra-articular  knee joint injections with local anesthetic and steroid  Possible series of 5 bilateral intra-articular Hyalgan knee injections  Diagnostic bilateral genicular nerve blocks  Diagnostic caudal epidural steroid injection + diagnostic epidurogram  Diagnostic bilateral intra-articular shoulder joint injection  Diagnostic suprascapular nerve blocks  Diagnostic thoracic epidural steroid injections    PRN Procedures:   To be determined at a later time   Provider-requested follow-up: Return for No return appointment.. At this point the patient has decided not to take advantage of our treatment plan and would prefer to continue searching for a pain practice that we'll concentrate primarily on his medications.  No future appointments.  Primary Care Physician: Sandra Cockayne, MD Location: [facility] Note by: Kathlen Brunswick. Dossie Arbour, M.D, DABA, DABAPM, DABPM, DABIPP, FIPP Date: 01/10/2017; Time: 10:33 PM  Pain Score Disclaimer: We use the NRS-11 scale. This is a self-reported, subjective measurement of pain severity with only modest accuracy. It is used primarily to identify changes within a particular patient. It must be understood that outpatient pain scales are significantly less accurate that those used for research, where they can be applied under ideal controlled circumstances with minimal exposure to variables. In reality, the score is likely to be  a combination of pain intensity and pain affect, where pain affect describes the degree of emotional arousal or changes in action readiness caused by the sensory experience of pain. Factors such as social and work situation, setting, emotional state, anxiety levels, expectation, and prior pain experience may influence pain perception and show large inter-individual differences that may also be affected by time variables.  Patient instructions provided during this appointment: There are no Patient Instructions on file for  this visit.

## 2019-01-17 ENCOUNTER — Observation Stay
Admission: EM | Admit: 2019-01-17 | Discharge: 2019-01-18 | Disposition: A | Discharge status: Home / Self Care | Payer: Medicare Other | Attending: Internal Medicine | Admitting: Internal Medicine

## 2019-01-17 ENCOUNTER — Emergency Department: Payer: Medicare Other

## 2019-01-17 ENCOUNTER — Other Ambulatory Visit: Payer: Self-pay

## 2019-01-17 ENCOUNTER — Encounter: Payer: Self-pay | Admitting: Physician Assistant

## 2019-01-17 DIAGNOSIS — F17200 Nicotine dependence, unspecified, uncomplicated: Secondary | ICD-10-CM | POA: Diagnosis not present

## 2019-01-17 DIAGNOSIS — J439 Emphysema, unspecified: Secondary | ICD-10-CM | POA: Diagnosis not present

## 2019-01-17 DIAGNOSIS — E039 Hypothyroidism, unspecified: Secondary | ICD-10-CM | POA: Diagnosis present

## 2019-01-17 DIAGNOSIS — Z7982 Long term (current) use of aspirin: Secondary | ICD-10-CM | POA: Diagnosis not present

## 2019-01-17 DIAGNOSIS — Z79899 Other long term (current) drug therapy: Secondary | ICD-10-CM | POA: Diagnosis not present

## 2019-01-17 DIAGNOSIS — K2100 Gastro-esophageal reflux disease with esophagitis, without bleeding: Secondary | ICD-10-CM | POA: Diagnosis present

## 2019-01-17 DIAGNOSIS — F419 Anxiety disorder, unspecified: Secondary | ICD-10-CM | POA: Diagnosis not present

## 2019-01-17 DIAGNOSIS — Z791 Long term (current) use of non-steroidal anti-inflammatories (NSAID): Secondary | ICD-10-CM | POA: Diagnosis not present

## 2019-01-17 DIAGNOSIS — Z7951 Long term (current) use of inhaled steroids: Secondary | ICD-10-CM | POA: Diagnosis not present

## 2019-01-17 DIAGNOSIS — R079 Chest pain, unspecified: Secondary | ICD-10-CM | POA: Diagnosis present

## 2019-01-17 DIAGNOSIS — Z79890 Hormone replacement therapy (postmenopausal): Secondary | ICD-10-CM | POA: Insufficient documentation

## 2019-01-17 DIAGNOSIS — R072 Precordial pain: Secondary | ICD-10-CM | POA: Diagnosis not present

## 2019-01-17 DIAGNOSIS — M810 Age-related osteoporosis without current pathological fracture: Secondary | ICD-10-CM | POA: Diagnosis not present

## 2019-01-17 DIAGNOSIS — F411 Generalized anxiety disorder: Secondary | ICD-10-CM | POA: Diagnosis present

## 2019-01-17 LAB — HEPATIC FUNCTION PANEL
ALT: 10 U/L (ref 0–44)
AST: 16 U/L (ref 15–41)
Albumin: 3.9 g/dL (ref 3.5–5.0)
Alkaline Phosphatase: 80 U/L (ref 38–126)
Bilirubin, Direct: 0.1 mg/dL (ref 0.0–0.2)
Indirect Bilirubin: 0.3 mg/dL (ref 0.3–0.9)
Total Bilirubin: 0.4 mg/dL (ref 0.3–1.2)
Total Protein: 7 g/dL (ref 6.5–8.1)

## 2019-01-17 LAB — CBC
HCT: 48.1 % (ref 39.0–52.0)
Hemoglobin: 15.9 g/dL (ref 13.0–17.0)
MCH: 30.7 pg (ref 26.0–34.0)
MCHC: 33.1 g/dL (ref 30.0–36.0)
MCV: 92.9 fL (ref 80.0–100.0)
Platelets: 238 10*3/uL (ref 150–400)
RBC: 5.18 MIL/uL (ref 4.22–5.81)
RDW: 12.5 % (ref 11.5–15.5)
WBC: 10.6 10*3/uL — ABNORMAL HIGH (ref 4.0–10.5)
nRBC: 0 % (ref 0.0–0.2)

## 2019-01-17 LAB — TROPONIN I: Troponin I: <0.03 ng/mL (ref <0.03)

## 2019-01-17 LAB — BASIC METABOLIC PANEL
Anion gap: 8 (ref 5–15)
BUN: 17 mg/dL (ref 8–23)
CO2: 28 mmol/L (ref 22–32)
Calcium: 9.3 mg/dL (ref 8.9–10.3)
Chloride: 104 mmol/L (ref 98–111)
Creatinine, Ser: 0.77 mg/dL (ref 0.61–1.24)
GFR calc Af Amer: >60 mL/min (ref >60)
GFR calc non Af Amer: >60 mL/min (ref >60)
Glucose, Bld: 107 mg/dL — ABNORMAL HIGH (ref 70–99)
Potassium: 3.8 mmol/L (ref 3.5–5.1)
Sodium: 140 mmol/L (ref 135–145)

## 2019-01-17 LAB — LIPASE, BLOOD: Lipase: 26 U/L (ref 11–51)

## 2019-01-17 MED ORDER — IOPAMIDOL (ISOVUE-370) INJECTION 76%
100.0000 mL | Freq: Once | INTRAVENOUS | Status: AC | PRN
  Administered 2019-01-17: 100 mL via INTRAVENOUS

## 2019-01-17 MED ORDER — SODIUM CHLORIDE 0.9 % IV BOLUS
500.0000 mL | Freq: Once | INTRAVENOUS | Status: AC
  Administered 2019-01-17: 500 mL via INTRAVENOUS

## 2019-01-17 MED ORDER — OXYCODONE HCL 5 MG PO TABS: 5.0000 mg | ORAL_TABLET | Freq: Four times a day (QID) | ORAL | Status: DC | PRN

## 2019-01-17 MED ORDER — SODIUM CHLORIDE 0.9% FLUSH: 3.0000 mL | Freq: Once | INTRAVENOUS | Status: DC

## 2019-01-17 NOTE — H&P (Signed)
[facility] NAME: Mark [name]    MR#:  [id number]  DATE OF BIRTH:  1950/12/03  DATE OF ADMISSION:  01/17/2019  PRIMARY CARE PHYSICIAN: Colford, Delcie Roch, MD   REQUESTING/REFERRING PHYSICIAN: Burlene Arnt, MD  CHIEF COMPLAINT:   Chief Complaint  Patient presents with  . Chest Pain    HISTORY OF PRESENT ILLNESS:  Mark [name]  is a 68 y.o. male who presents with chief complaint as above.  Patient presents the ED with a complaint of chest pain.  He states that this started in the afternoon, and he initially thought it was some bad indigestion.  He does have a history of some reflux.  However, the pain got significantly worse, worse than any reflux he is ever had before.  It was associated with some shortness of breath.  He called EMS and came to ED for evaluation.  Initial work-up here is largely within normal limits, although CT angios of his chest does show heavy coronary calcification.  Hospitalist called for admission and further evaluation  PAST MEDICAL HISTORY:   Past Medical History:  Diagnosis Date  . Acute respiratory failure with hypoxia and hypercapnia ([place]) 05/01/2016  . Asthma   . COPD (chronic obstructive pulmonary disease) ([place])   . Osteoporosis      PAST SURGICAL HISTORY:   Past Surgical History:  Procedure Laterality Date  . BACK SURGERY       SOCIAL HISTORY:   Social History   Tobacco Use  . Smoking status: Current Some Day Smoker  . Smokeless tobacco: Never Used  Substance Use Topics  . Alcohol use: No     FAMILY HISTORY:    Family history reviewed and is non-contributory DRUG ALLERGIES:  No Known Allergies  MEDICATIONS AT HOME:   Prior to Admission medications   Medication Sig Start Date End Date Taking? Authorizing Provider  albuterol (PROVENTIL HFA;VENTOLIN HFA) 108 (90 Base) MCG/ACT inhaler Inhale 2 puffs into the lungs every 4 (four) hours as needed for wheezing.     [provider]  aspirin 81 MG tablet Take 81 mg by mouth daily.    [provider]  budesonide-formoterol (SYMBICORT) 160-4.5 MCG/ACT inhaler Inhale 2 puffs into the lungs 2 (two) times daily.    [provider]  cholecalciferol (VITAMIN D) 1000 units tablet Take 1,000 Units by mouth daily.    [provider]  clonazePAM (KLONOPIN) 1 MG tablet Take 1 mg by mouth 2 (two) times daily.    [provider]  cyclobenzaprine (FLEXERIL) 10 MG tablet Take 10 mg by mouth 3 (three) times daily as needed for muscle spasms.    [provider]  docusate sodium (COLACE) 100 MG capsule Take 100 mg by mouth daily.    [provider]  gabapentin (NEURONTIN) 800 MG tablet Take 800 mg by mouth 3 (three) times daily.    [provider]  guaiFENesin-dextromethorphan (ROBITUSSIN DM) 100-10 MG/5ML syrup Take 5 mLs by mouth every 4 (four) hours as needed for cough. 05/03/16   Hower, Aaron Mose, MD  ibuprofen (ADVIL,MOTRIN) 200 MG tablet Take 200 mg by mouth every 8 (eight) hours as needed. 2 tabs    [provider]  ipratropium (ATROVENT HFA) 17 MCG/ACT inhaler Inhale 2 puffs into the lungs every 6 (six) hours.    [provider]  levothyroxine (SYNTHROID, LEVOTHROID) 25 MCG tablet Take 25 mcg by mouth daily before breakfast.    [provider]  rOPINIRole (REQUIP) 1 MG tablet Take 1 mg by mouth at bedtime.    [provider]    REVIEW OF SYSTEMS:  Review of Systems  Constitutional: Negative for chills, fever, malaise/fatigue and weight loss.  HENT: Negative for ear pain, hearing loss and tinnitus.   Eyes: Negative for blurred vision, double vision, pain and redness.  Respiratory: Positive for shortness of breath. Negative for cough and hemoptysis.   Cardiovascular: Positive for chest pain. Negative for palpitations, orthopnea and leg swelling.  Gastrointestinal: Negative for abdominal pain, constipation, diarrhea,  nausea and vomiting.  Genitourinary: Negative for dysuria, frequency and hematuria.  Musculoskeletal: Negative for back pain, joint pain and neck pain.  Skin:       No acne, rash, or lesions  Neurological: Negative for dizziness, tremors, focal weakness and weakness.  Endo/Heme/Allergies: Negative for polydipsia. Does not bruise/bleed easily.  Psychiatric/Behavioral: Negative for depression. The patient is not nervous/anxious and does not have insomnia.      VITAL SIGNS:   Vitals:   01/17/19 2030 01/17/19 2100 01/17/19 2130 01/17/19 2200  BP: 107/72 113/76 122/78 120/74  Pulse: (!) 108 (!) 110 (!) 109 97  Resp: 17 18 11 17   Temp:      TempSrc:      SpO2: 96% 100% 100% 100%  Weight:      Height:       Wt Readings from Last 3 Encounters:  01/17/19 74.8 kg  01/10/17 72.6 kg  05/05/16 70.8 kg    PHYSICAL EXAMINATION:  Physical Exam  Vitals reviewed. Constitutional: He is oriented to person, place, and time. He appears well-developed and well-nourished. No distress.  HENT:  Head: Normocephalic and atraumatic.  Mouth/Throat: Oropharynx is clear and moist.  Eyes: Pupils are equal, round, and reactive to light. Conjunctivae and EOM are normal. No scleral icterus.  Neck: Normal range of motion. Neck supple. No JVD present. No thyromegaly present.  Cardiovascular: Normal rate, regular rhythm and intact distal pulses. Exam reveals no gallop and no friction rub.  No murmur heard. Respiratory: Effort normal and breath sounds normal. No respiratory distress. He has no wheezes. He has no rales.  GI: Soft. Bowel sounds are normal. He exhibits no distension. There is no abdominal tenderness.  Musculoskeletal: Normal range of motion.        General: No edema.     Comments: No arthritis, no gout  Lymphadenopathy:    He has no cervical adenopathy.  Neurological: He is alert and oriented to person, place, and time. No cranial nerve deficit.  No dysarthria, no aphasia  Skin: Skin is warm  and dry. No rash noted. No erythema.  Psychiatric: He has a normal mood and affect. His behavior is normal. Judgment and thought content normal.    LABORATORY PANEL:   CBC Recent Labs  Lab 01/17/19 1845  WBC 10.6*  HGB 15.9  HCT 48.1  PLT 238   ------------------------------------------------------------------------------------------------------------------  Chemistries  Recent Labs  Lab 01/17/19 1845 01/17/19 1903  NA 140  --   K 3.8  --   CL 104  --   CO2 28  --   GLUCOSE 107*  --   BUN 17  --   CREATININE 0.77  --   CALCIUM 9.3  --   AST  --  16  ALT  --  10  ALKPHOS  --  80  BILITOT  --  0.4   ------------------------------------------------------------------------------------------------------------------  Cardiac Enzymes Recent Labs  Lab 01/17/19 1845  TROPONINI <0.03   ------------------------------------------------------------------------------------------------------------------  RADIOLOGY:  Dg Chest 1 View  Result Date: 01/17/2019 CLINICAL DATA:  Chest pain for several hours EXAM: CHEST  1 VIEW COMPARISON:  05/01/2016 FINDINGS: Cardiac shadow is stable. Chronic fibrotic changes are again seen throughout both lungs. No focal infiltrate or sizable effusion is seen. No acute bony abnormality is noted. IMPRESSION: Chronic changes without acute abnormality. Electronically Signed   By: Inez Catalina M.D.   On: 01/17/2019 19:49   Ct Angio Chest/abd/pel For Dissection W And/or Wo Contrast  Result Date: 01/17/2019 CLINICAL DATA:  Chest pain for several hours EXAM: CT ANGIOGRAPHY CHEST, ABDOMEN AND PELVIS TECHNIQUE: Multidetector CT imaging through the chest, abdomen and pelvis was performed using the standard protocol during bolus administration of intravenous contrast. Multiplanar reconstructed images and MIPs were obtained and reviewed to evaluate the vascular anatomy. CONTRAST:  162mL [id number] COMPARISON:  05/01/2016 FINDINGS: CTA CHEST FINDINGS  Cardiovascular: Thoracic aorta and its branches are within normal limits. Mild atherosclerotic calcifications are seen. No aneurysmal dilatation or dissection is noted. Heavy coronary calcifications are seen. No cardiac enlargement is noted. The pulmonary artery shows a normal branching pattern without evidence of filling defect to suggest pulmonary embolism. Mediastinum/Nodes: Thoracic inlet is within normal limits. Pretracheal lymph node is noted best seen on image number 28 of series 5 measuring 12 mm in short axis but significantly decreased from the prior exam at which time it measured 20 mm in short axis. No other sizable adenopathy is noted. The esophagus as visualized is within normal limits. Lungs/Pleura: Significant emphysematous changes are noted with biapical scarring left greater than right. No focal infiltrate is seen. No focal infiltrate or sizable effusion is seen. Stable right lower lobe nodule is noted on image number 69 of series 6. This is benign in etiology given its stability. Vague nodular density is noted on image number 96 of series 6 in the right lower lobe adjacent to the bronchial tree. It is stable in appearance from the prior exam and also consistent with a benign etiology. No new nodular changes are seen. Mild fibrotic changes in the lower lobes are noted, sequelae from previous infiltrates. Musculoskeletal: Degenerative changes of the thoracic spine are noted. Changes of prior vertebral augmentation are noted at T11 and T12. No acute compression deformity is noted. Review of the MIP images confirms the above findings. CTA ABDOMEN AND PELVIS FINDINGS VASCULAR Aorta: Atherosclerotic calcifications are identified without aneurysmal dilatation. Celiac: Patent without evidence of aneurysm, dissection, vasculitis or significant stenosis. SMA: Patent without evidence of aneurysm, dissection, vasculitis or significant stenosis. Renals: Both renal arteries are patent without evidence of  aneurysm, dissection, vasculitis, fibromuscular dysplasia or significant stenosis. IMA: Patent without evidence of aneurysm, dissection, vasculitis or significant stenosis. Iliacs: Common iliac arteries demonstrate atherosclerotic calcifications bilaterally. Mild irregular plaque formation is noted in the left common iliac artery with mild stenotic changes. Veins: No specific vein abnormality is noted. Review of the MIP images confirms the above findings. NON-VASCULAR Hepatobiliary: No focal liver abnormality is seen. No gallstones, gallbladder wall thickening, or biliary dilatation. Pancreas: Unremarkable. No pancreatic ductal dilatation or surrounding inflammatory changes. Spleen: Normal in size without focal abnormality. Adrenals/Urinary Tract: Adrenal glands are within normal limits bilaterally. Kidneys are well visualize without renal calculi or obstructive changes. The bladder is well distended. Stomach/Bowel: Large right inguinal hernia is identified containing a portion of the ascending colon and cecum. Distal small bowel is noted as well. The appendix is within normal limits. No obstructive changes are noted secondary to this hernia. The  stomach and small bowel are within normal limits. Lymphatic: No specific lymphadenopathy is identified. Reproductive: Prostate is unremarkable. Other: No abdominal wall hernia or abnormality. No abdominopelvic ascites. Musculoskeletal: Degenerative changes of lumbar spine are noted. Multilevel vertebral augmentation is noted at L1, L3 and L4. L2 compression deformity is noted stable from the prior exam. Facet hypertrophic changes are noted. Review of the MIP images confirms the above findings. IMPRESSION: No evidence of aortic dissection or aneurysmal dilatation. No evidence of pulmonary emboli. Chronic changes within the lungs bilaterally improved when compared with the previous exam. Stable nodules are noted in the right lower lobe. Large right inguinal hernia containing  a portion of the terminal ileum and ascending colon. The appendix is within normal limits. Electronically Signed   By: Inez Catalina M.D.   On: 01/17/2019 21:27    EKG:   Orders placed or performed during the hospital encounter of 01/17/19  . ED EKG  . 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 Labs, imaging and/or ECG reviewed by provider and discussed with patient/family. Management plans discussed with the patient and/or family.  DVT PROPHYLAXIS: SubQ lovenox   GI PROPHYLAXIS:  None  ADMISSION STATUS: Observation  CODE STATUS: Full Code Status History    Date Active Date Inactive Code Status Order ID Comments User Context   05/01/2016 0934 05/03/2016 1455 Full Code [id number]  Harrie Foreman, MD Inpatient      TOTAL TIME TAKING CARE OF THIS PATIENT: 40 minutes.   Ethlyn Daniels 01/17/2019, 10:39 PM  [facility]  Office  [phone]  CC: Primary care physician; Colford, Delcie Roch, MD  Note:  This document was prepared using Dragon voice recognition software and may include unintentional dictation errors.

## 2019-01-17 NOTE — ED Notes (Signed)
Pt daughter, Mirna Mires, number [phone].

## 2019-01-17 NOTE — ED Notes (Signed)
Pt given warm blanket at this time 

## 2019-01-17 NOTE — ED Notes (Signed)
Pt given water at this time, MD aware.

## 2019-01-17 NOTE — ED Notes (Signed)
Pt states at home he took 325 of aspirin and 2 chewable tums at home with no relief.

## 2019-01-17 NOTE — ED Notes (Signed)
Patient transported to CT 

## 2019-01-17 NOTE — ED Provider Notes (Signed)
[facility] Emergency Department Provider Note ____________________________________________  Time seen: 1858  I have reviewed the triage vital signs and the nursing notes.  HISTORY  Chief Complaint  Chest Pain  HPI Mark [name] is a 68 y.o. male presents to the ED via EMS from home.  Patient describes several hours of substernal chest pain that he describes as burning.  Patient reports symptoms that occurred at about 5 PM this afternoon.  Patient described severe burning chest pain that he described at an 8 out of 10 at the time of onset.  Pain persisted for several hours before he was convinced to call EMS.  He denies any preceding cough, congestion, syncope, nausea, vomiting, or diuresis.  Patient had recently awakened in the afternoon, denies any exertional activities prior to onset.  He took 325 mg of aspirin and a dose of Prilosec after the onset of his chest pain.  He recalls the pain was more severe than any heartburn or reflux he is never experienced.  She presents now for further evaluation of his chest pain.  He reports pain at this time is improving noting the pain is approximately a 5 out of 10 at this time.  His past medical history significant for asthma, COPD, chronic back pain, and patient is a current smoker.  He is on 2 to 4 L of oxygen chronically at home.  Past Medical History:  Diagnosis Date  . Acute respiratory failure with hypoxia and hypercapnia ([place]) 05/01/2016  . Asthma   . COPD (chronic obstructive pulmonary disease) ([place])   . Osteoporosis     Patient Active Problem List   Diagnosis Date Noted  . Hypokalemia 01/10/2017  . Elevated C-reactive protein (CRP) 01/10/2017  . Elevated sed rate 01/10/2017  . Chronic hip pain (Location of Tertiary source of pain) (Bilateral) (L>R) 01/10/2017  . Chronic pain syndrome 10/24/2016  . Chronic back pain (Location of Primary Source of Pain) (Bilateral) (R>L) 10/24/2016  . Disturbance of skin  sensation 05/05/2016  . Peripheral neuropathy 05/05/2016  . Chronic knee pain (Bilateral) (L>R) 05/05/2016  . Chronic shoulder pain (Bilateral) (R>L) 05/05/2016  . Opioid-induced constipation (OIC) 05/05/2016  . Restless leg syndrome 05/05/2016  . Mediastinal adenopathy 05/05/2016  . Pulmonary nodule (7 mm right lower lobe) 05/05/2016  . Inguinal hernia without obstruction (Right) 05/05/2016  . 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 Spasm of back muscles 05/05/2016  . Chronic musculoskeletal pain 05/05/2016  . Neurogenic pain 05/05/2016  . Neuropathic pain 05/05/2016  . Sacral back pain (Location of Secondary source of pain) (Bilateral) (L>R) 05/05/2016  . Logorrhea 05/05/2016  . Chronic lower extremity pain (Bilateral) (L>R) 05/05/2016  . Chronic neck pain (posterior) (L>R) 05/05/2016  . 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 Cervical facet hypertrophy (C2-3) (Bilateral) (L>R) 05/05/2016  . Cervical facet syndrome (Bilateral) (L>R) 05/05/2016  . 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) 05/05/2016  . Compression fracture of L2 ([place]) (seen on 11/25/2014 x-ray) 05/05/2016  . Anemia, chronic disease 05/05/2016  . Lumbar facet syndrome (Bilateral) (R>L) 05/05/2016  . Opiate use (75 MME/Day) 03/14/2016  . Long term prescription opiate use 03/14/2016  . Long term current use of opiate analgesic 03/14/2016  . Long term prescription benzodiazepine use 03/14/2016  . History of marijuana use 03/14/2016  . Compression fractures (L4, L3, L1, T12 and T11) 06/26/2015  . Acquired atrophy of thyroid 06/26/2015  . OP (osteoporosis) 06/26/2015  .  Other long term (current) drug therapy 07/12/2013  . Pain medication agreement broken 07/12/2013  . Clinical depression 01/20/2005  . Esophagitis, reflux 06/01/2004  . Current tobacco use 06/01/2004  . Anxiety state 05/27/2004  . COPD (chronic  obstructive pulmonary disease) with severe emphysema ([place]) 05/27/2004    Past Surgical History:  Procedure Laterality Date  . BACK SURGERY      Prior to Admission medications   Medication Sig Start Date End Date Taking? Authorizing Provider  albuterol (PROVENTIL HFA;VENTOLIN HFA) 108 (90 Base) MCG/ACT inhaler Inhale 2 puffs into the lungs every 4 (four) hours as needed for wheezing.    [provider]  aspirin 81 MG tablet Take 81 mg by mouth daily.    [provider]  budesonide-formoterol (SYMBICORT) 160-4.5 MCG/ACT inhaler Inhale 2 puffs into the lungs 2 (two) times daily.    [provider]  cholecalciferol (VITAMIN D) 1000 units tablet Take 1,000 Units by mouth daily.    [provider]  clonazePAM (KLONOPIN) 1 MG tablet Take 1 mg by mouth 2 (two) times daily.    [provider]  cyclobenzaprine (FLEXERIL) 10 MG tablet Take 10 mg by mouth 3 (three) times daily as needed for muscle spasms.    [provider]  docusate sodium (COLACE) 100 MG capsule Take 100 mg by mouth daily.    [provider]  gabapentin (NEURONTIN) 800 MG tablet Take 800 mg by mouth 3 (three) times daily.    [provider]  guaiFENesin-dextromethorphan (ROBITUSSIN DM) 100-10 MG/5ML syrup Take 5 mLs by mouth every 4 (four) hours as needed for cough. 05/03/16   Hower, Aaron Mose, MD  ibuprofen (ADVIL,MOTRIN) 200 MG tablet Take 200 mg by mouth every 8 (eight) hours as needed. 2 tabs    [provider]  ipratropium (ATROVENT HFA) 17 MCG/ACT inhaler Inhale 2 puffs into the lungs every 6 (six) hours.    [provider]  levothyroxine (SYNTHROID, LEVOTHROID) 25 MCG tablet Take 25 mcg by mouth daily before breakfast.    [provider]  rOPINIRole (REQUIP) 1 MG tablet Take 1 mg by mouth at bedtime.    [provider]    Allergies Patient has no known allergies.  History reviewed. No pertinent family history.  Social  History Social History   Tobacco Use  . Smoking status: Current Some Day Smoker  . Smokeless tobacco: Never Used  Substance Use Topics  . Alcohol use: No  . Drug use: No    Review of Systems  Constitutional: Negative for fever. Eyes: Negative for visual changes. ENT: Negative for sore throat. Cardiovascular: Positive for chest pain. Respiratory: Negative for shortness of breath. Gastrointestinal: Negative for abdominal pain, vomiting and diarrhea. Genitourinary: Negative for dysuria. Musculoskeletal: Negative for back pain. Skin: Negative for rash. Neurological: Negative for headaches, focal weakness or numbness. ____________________________________________  PHYSICAL EXAM:  VITAL SIGNS: ED Triage Vitals  Enc Vitals Group     BP 01/17/19 1900 119/85     Pulse Rate 01/17/19 1900 (!) 110     Resp 01/17/19 1900 (!) 23     Temp 01/17/19 1834 98.6 F (37 C)     Temp Source 01/17/19 1834 Oral     SpO2 01/17/19 1900 96 %     Weight 01/17/19 1835 165 lb (74.8 kg)     Height 01/17/19 1835 6' (1.829 m)     Head Circumference --      Peak Flow --      Pain Score  01/17/19 1834 5     Pain Loc --      Pain Edu? --      Excl. in [place]? --     Constitutional: Alert and oriented. Well appearing and in no distress. Head: Normocephalic and atraumatic. Eyes: Conjunctivae are normal. Normal extraocular movements Cardiovascular: Normal rate, regular rhythm. Normal distal pulses. Respiratory: Normal respiratory effort. No wheezes/rales/rhonchi. Gastrointestinal: Soft and nontender. No distention. Musculoskeletal: Nontender with normal range of motion in all extremities.  Neurologic: Normal speech and language. No gross focal neurologic deficits are appreciated. Skin:  Skin is warm, dry and intact. No rash noted. Psychiatric: Mood and affect are normal. Patient exhibits appropriate insight and judgment. ____________________________________________   LABS (pertinent  positives/negatives) Labs Reviewed  BASIC METABOLIC PANEL - Abnormal; Notable for the following components:      Result Value   Glucose, Bld 107 (*)    All other components within normal limits  CBC - Abnormal; Notable for the following components:   WBC 10.6 (*)    All other components within normal limits  TROPONIN I  HEPATIC FUNCTION PANEL  LIPASE, BLOOD  ____________________________________________  EKG  Sinus Tachycardia 114 bpm Normal intervals No STEMI ___________________________________________   RADIOLOGY  CXR No acute findings ____________________________________________  PROCEDURES  Procedures ____________________________________________  INITIAL IMPRESSION / ASSESSMENT AND PLAN / ED COURSE  Differential diagnosis includes, but is not limited to, ACS, aortic dissection, pulmonary embolism, cardiac tamponade, pneumothorax, pneumonia, pericarditis, myocarditis, GI-related causes including esophagitis/gastritis, and musculoskeletal chest wall pain.    Patient with ED evaluation of several hours of substernal chest pain with referral into the neck and shoulders.  Patient denies any diaphoresis, nausea, vomiting, dizziness, or syncope related to his episode.  Further disposition is deferred to the hospitalist and my attending physician at this time, pending his chest pain work-up. ____________________________________________  FINAL CLINICAL IMPRESSION(S) / ED DIAGNOSES  Final diagnoses:  Chest pain      Carmie End, Dannielle Karvonen, PA-C 01/17/19 2032    Schuyler Amor, MD 01/17/19 2200    Schuyler Amor, MD 01/17/19 2211    Schuyler Amor, MD 01/17/19 2211

## 2019-01-17 NOTE — ED Notes (Signed)
.. ED TO INPATIENT HANDOFF REPORT  ED Nurse Name and Phone #: Deneise Lever [phone]  S Name/Age/Gender Mark [name] 68 y.o. male Room/Bed: ED13A/ED13A  Code Status   Code Status: Prior  Home/SNF/Other Home Patient oriented to: self, place, time and situation Is this baseline? Yes   Triage Complete: Triage complete  Chief Complaint chest pain  Triage Note Pt presents from home via acems with c/o chest pain that began at 5pm. Pt described pain as burning and aching sensation in center of chest that raidated to bilateral shoulders. Hx of copd, pt is still a current smoker. Pt chronically on 2-4L at home. HR 130 bpm for ems. 144 blood sugar. 99 temp for ems. 20G in left forearm placed by ems prior to arrival.    Allergies No Known Allergies  Level of Care/Admitting Diagnosis ED Disposition    ED Disposition Condition Comment   Admit  The patient appears reasonably stabilized for admission considering the current resources, flow, and capabilities available in the ED at this time, and I doubt any other [facility] requiring further screening and/or treatment in the ED prior to admission is  present.       B Medical/Surgery History Past Medical History:  Diagnosis Date  . Acute respiratory failure with hypoxia and hypercapnia ([place]) 05/01/2016  . Asthma   . COPD (chronic obstructive pulmonary disease) ([place])   . Osteoporosis    Past Surgical History:  Procedure Laterality Date  . BACK SURGERY       A IV Location/Drains/Wounds Patient Lines/Drains/Airways Status   Active Line/Drains/Airways    Name:   Placement date:   Placement time:   Site:   Days:   Peripheral IV 05/01/16 Left Forearm   05/01/16    0838    Forearm   991   Peripheral IV 05/01/16 Right Forearm   05/01/16    0842    Forearm   991   Peripheral IV 01/17/19 Left Wrist   01/17/19    1909    Wrist   less than 1          Intake/Output Last 24 hours  Intake/Output Summary (Last 24 hours) at 01/17/2019 2216 Last data  filed at 01/17/2019 2215 Gross per 24 hour  Intake 500 ml  Output -  Net 500 ml    Labs/Imaging Results for orders placed or performed during the hospital encounter of 01/17/19 (from the past 48 hour(s))  Basic metabolic panel     Status: Abnormal   Collection Time: 01/17/19  6:45 PM  Result Value Ref Range   Sodium 140 135 - 145 mmol/L   Potassium 3.8 3.5 - 5.1 mmol/L   Chloride 104 98 - 111 mmol/L   CO2 28 22 - 32 mmol/L   Glucose, Bld 107 (H) 70 - 99 mg/dL   BUN 17 8 - 23 mg/dL   Creatinine, Ser 0.77 0.61 - 1.24 mg/dL   Calcium 9.3 8.9 - 10.3 mg/dL   GFR calc non Af Amer >60 >60 mL/min   GFR calc Af Amer >60 >60 mL/min   Anion gap 8 5 - 15    Comment: Performed at [facility], [address] [place], [place] [address]  CBC     Status: Abnormal   Collection Time: 01/17/19  6:45 PM  Result Value Ref Range   WBC 10.6 (H) 4.0 - 10.5 K/uL   RBC 5.18 4.22 - 5.81 MIL/uL   Hemoglobin 15.9 13.0 - 17.0 g/dL   HCT 48.1 39.0 -  52.0 %   MCV 92.9 80.0 - 100.0 fL   MCH 30.7 26.0 - 34.0 pg   MCHC 33.1 30.0 - 36.0 g/dL   RDW 12.5 11.5 - 15.5 %   Platelets 238 150 - 400 K/uL   nRBC 0.0 0.0 - 0.2 %    Comment: Performed at [facility], [address] [place], [place] [address]  Troponin I - ONCE - STAT     Status: None   Collection Time: 01/17/19  6:45 PM  Result Value Ref Range   Troponin I <0.03 <0.03 ng/mL    Comment: Performed at [facility], [address] [place], [place] [address]  Hepatic function panel     Status: None   Collection Time: 01/17/19  7:03 PM  Result Value Ref Range   Total Protein 7.0 6.5 - 8.1 g/dL   Albumin 3.9 3.5 - 5.0 g/dL   AST 16 15 - 41 U/L   ALT 10 0 - 44 U/L   Alkaline Phosphatase 80 38 - 126 U/L   Total Bilirubin 0.4 0.3 - 1.2 mg/dL   Bilirubin, Direct 0.1 0.0 - 0.2 mg/dL   Indirect Bilirubin 0.3 0.3 - 0.9 mg/dL    Comment: Performed at [facility], [address] [place], [place] [address]   Lipase, blood     Status: None   Collection Time: 01/17/19  7:03 PM  Result Value Ref Range   Lipase 26 11 - 51 U/L    Comment: Performed at [facility], [address] [place], [place] [address]   Dg Chest 1 View  Result Date: 01/17/2019 CLINICAL DATA:  Chest pain for several hours EXAM: CHEST  1 VIEW COMPARISON:  05/01/2016 FINDINGS: Cardiac shadow is stable. Chronic fibrotic changes are again seen throughout both lungs. No focal infiltrate or sizable effusion is seen. No acute bony abnormality is noted. IMPRESSION: Chronic changes without acute abnormality. Electronically Signed   By: Inez Catalina M.D.   On: 01/17/2019 19:49   Ct Angio Chest/abd/pel For Dissection W And/or Wo Contrast  Result Date: 01/17/2019 CLINICAL DATA:  Chest pain for several hours EXAM: CT ANGIOGRAPHY CHEST, ABDOMEN AND PELVIS TECHNIQUE: Multidetector CT imaging through the chest, abdomen and pelvis was performed using the standard protocol during bolus administration of intravenous contrast. Multiplanar reconstructed images and MIPs were obtained and reviewed to evaluate the vascular anatomy. CONTRAST:  175mL ISOVUE-370 COMPARISON:  05/01/2016 FINDINGS: CTA CHEST FINDINGS Cardiovascular: Thoracic aorta and its branches are within normal limits. Mild atherosclerotic calcifications are seen. No aneurysmal dilatation or dissection is noted. Heavy coronary calcifications are seen. No cardiac enlargement is noted. The pulmonary artery shows a normal branching pattern without evidence of filling defect to suggest pulmonary embolism. Mediastinum/Nodes: Thoracic inlet is within normal limits. Pretracheal lymph node is noted best seen on image number 28 of series 5 measuring 12 mm in short axis but significantly decreased from the prior exam at which time it measured 20 mm in short axis. No other sizable adenopathy is noted. The esophagus as visualized is within normal limits. Lungs/Pleura: Significant emphysematous  changes are noted with biapical scarring left greater than right. No focal infiltrate is seen. No focal infiltrate or sizable effusion is seen. Stable right lower lobe nodule is noted on image number 69 of series 6. This is benign in etiology given its stability. Vague nodular density is noted on image number 96 of series 6 in the right lower lobe adjacent to the bronchial tree. It  is stable in appearance from the prior exam and also consistent with a benign etiology. No new nodular changes are seen. Mild fibrotic changes in the lower lobes are noted, sequelae from previous infiltrates. Musculoskeletal: Degenerative changes of the thoracic spine are noted. Changes of prior vertebral augmentation are noted at T11 and T12. No acute compression deformity is noted. Review of the MIP images confirms the above findings. CTA ABDOMEN AND PELVIS FINDINGS VASCULAR Aorta: Atherosclerotic calcifications are identified without aneurysmal dilatation. Celiac: Patent without evidence of aneurysm, dissection, vasculitis or significant stenosis. SMA: Patent without evidence of aneurysm, dissection, vasculitis or significant stenosis. Renals: Both renal arteries are patent without evidence of aneurysm, dissection, vasculitis, fibromuscular dysplasia or significant stenosis. IMA: Patent without evidence of aneurysm, dissection, vasculitis or significant stenosis. Iliacs: Common iliac arteries demonstrate atherosclerotic calcifications bilaterally. Mild irregular plaque formation is noted in the left common iliac artery with mild stenotic changes. Veins: No specific vein abnormality is noted. Review of the MIP images confirms the above findings. NON-VASCULAR Hepatobiliary: No focal liver abnormality is seen. No gallstones, gallbladder wall thickening, or biliary dilatation. Pancreas: Unremarkable. No pancreatic ductal dilatation or surrounding inflammatory changes. Spleen: Normal in size without focal abnormality. Adrenals/Urinary  Tract: Adrenal glands are within normal limits bilaterally. Kidneys are well visualize without renal calculi or obstructive changes. The bladder is well distended. Stomach/Bowel: Large right inguinal hernia is identified containing a portion of the ascending colon and cecum. Distal small bowel is noted as well. The appendix is within normal limits. No obstructive changes are noted secondary to this hernia. The stomach and small bowel are within normal limits. Lymphatic: No specific lymphadenopathy is identified. Reproductive: Prostate is unremarkable. Other: No abdominal wall hernia or abnormality. No abdominopelvic ascites. Musculoskeletal: Degenerative changes of lumbar spine are noted. Multilevel vertebral augmentation is noted at L1, L3 and L4. L2 compression deformity is noted stable from the prior exam. Facet hypertrophic changes are noted. Review of the MIP images confirms the above findings. IMPRESSION: No evidence of aortic dissection or aneurysmal dilatation. No evidence of pulmonary emboli. Chronic changes within the lungs bilaterally improved when compared with the previous exam. Stable nodules are noted in the right lower lobe. Large right inguinal hernia containing a portion of the terminal ileum and ascending colon. The appendix is within normal limits. Electronically Signed   By: Inez Catalina M.D.   On: 01/17/2019 21:27    Pending Labs Unresulted Labs (From admission, onward)   None      Vitals/Pain Today's Vitals   01/17/19 2000 01/17/19 2030 01/17/19 2100 01/17/19 2130  BP: 118/73 107/72 113/76 122/78  Pulse: 97 (!) 108 (!) 110 (!) 109  Resp: 19 17 18 11   Temp:      TempSrc:      SpO2: 99% 96% 100% 100%  Weight:      Height:      PainSc:        Isolation Precautions No active isolations  Medications Medications  sodium chloride 0.9 % bolus 500 mL (0 mLs Intravenous Stopped 01/17/19 2215)  iopamidol (ISOVUE-370) 76 % injection 100 mL (100 mLs Intravenous Contrast Given  01/17/19 2044)    Mobility walks with device Low fall risk   Focused Assessments Cardiac Assessment Handoff:    Lab Results  Component Value Date   TROPONINI <0.03 01/17/2019   No results found for: DDIMER Does the Patient currently have chest pain? No     R Recommendations: See Admitting Provider Note  Report given to:  Additional Notes:

## 2019-01-17 NOTE — ED Triage Notes (Signed)
Pt presents from home via acems with c/o chest pain that began at 5pm. Pt described pain as burning and aching sensation in center of chest that raidated to bilateral shoulders. Hx of copd, pt is still a current smoker. Pt chronically on 2-4L at home. HR 130 bpm for ems. 144 blood sugar. 99 temp for ems. 20G in left forearm placed by ems prior to arrival.

## 2019-01-18 ENCOUNTER — Other Ambulatory Visit: Payer: Self-pay

## 2019-01-18 ENCOUNTER — Observation Stay: Payer: Medicare Other

## 2019-01-18 DIAGNOSIS — R072 Precordial pain: Secondary | ICD-10-CM | POA: Diagnosis not present

## 2019-01-18 LAB — CBC
HCT: 42.9 % (ref 39.0–52.0)
Hemoglobin: 13.7 g/dL (ref 13.0–17.0)
MCH: 29.7 pg (ref 26.0–34.0)
MCHC: 31.9 g/dL (ref 30.0–36.0)
MCV: 92.9 fL (ref 80.0–100.0)
Platelets: 212 10*3/uL (ref 150–400)
RBC: 4.62 MIL/uL (ref 4.22–5.81)
RDW: 12.7 % (ref 11.5–15.5)
WBC: 7.8 10*3/uL (ref 4.0–10.5)
nRBC: 0 % (ref 0.0–0.2)

## 2019-01-18 LAB — BASIC METABOLIC PANEL
Anion gap: 7 (ref 5–15)
BUN: 15 mg/dL (ref 8–23)
CO2: 29 mmol/L (ref 22–32)
Calcium: 8.8 mg/dL — ABNORMAL LOW (ref 8.9–10.3)
Chloride: 103 mmol/L (ref 98–111)
Creatinine, Ser: 0.86 mg/dL (ref 0.61–1.24)
GFR calc Af Amer: >60 mL/min (ref >60)
GFR calc non Af Amer: >60 mL/min (ref >60)
Glucose, Bld: 93 mg/dL (ref 70–99)
Potassium: 3.5 mmol/L (ref 3.5–5.1)
Sodium: 139 mmol/L (ref 135–145)

## 2019-01-18 LAB — TROPONIN I
Troponin I: <0.03 ng/mL (ref <0.03)
Troponin I: <0.03 ng/mL (ref <0.03)

## 2019-01-18 LAB — NM MYOCAR MULTI W/SPECT W/WALL MOTION / EF
Estimated workload: 1 METS
Exercise duration (min): 1 min
Exercise duration (sec): 0 s
LV dias vol: 149 mL (ref 62–150)
LV sys vol: 90 mL
MPHR: 153 {beats}/min
Peak HR: 105 {beats}/min
Percent HR: 68 %
Rest HR: 85 {beats}/min
SDS: 2
SRS: 6
SSS: 4
TID: 0.94

## 2019-01-18 MED ORDER — ACETAMINOPHEN 650 MG RE SUPP: 650.0000 mg | Freq: Four times a day (QID) | RECTAL | Status: DC | PRN

## 2019-01-18 MED ORDER — ONDANSETRON HCL 4 MG/2ML IJ SOLN: 4.0000 mg | Freq: Four times a day (QID) | INTRAMUSCULAR | Status: DC | PRN

## 2019-01-18 MED ORDER — ASPIRIN EC 81 MG PO TBEC
81.0000 mg | DELAYED_RELEASE_TABLET | Freq: Every day | ORAL | Status: DC
  Administered 2019-01-18: 81 mg via ORAL
  Filled 2019-01-18: qty 1

## 2019-01-18 MED ORDER — SODIUM CHLORIDE 0.9 % IV SOLN
INTRAVENOUS | Status: AC
  Administered 2019-01-18: 01:00:00 via INTRAVENOUS

## 2019-01-18 MED ORDER — TECHNETIUM TC 99M TETROFOSMIN IV KIT
31.9900 | PACK | Freq: Once | INTRAVENOUS | Status: AC | PRN
  Administered 2019-01-18: 31.99 via INTRAVENOUS

## 2019-01-18 MED ORDER — TECHNETIUM TC 99M TETROFOSMIN IV KIT
11.1000 | PACK | Freq: Once | INTRAVENOUS | Status: AC | PRN
  Administered 2019-01-18: 11.1 via INTRAVENOUS

## 2019-01-18 MED ORDER — FLUTICASONE-UMECLIDIN-VILANT 100-62.5-25 MCG/INH IN AEPB: 1.0000 | INHALATION_SPRAY | Freq: Every day | RESPIRATORY_TRACT | Status: DC

## 2019-01-18 MED ORDER — IBUPROFEN 400 MG PO TABS
400.0000 mg | ORAL_TABLET | Freq: Three times a day (TID) | ORAL | Status: DC | PRN
  Administered 2019-01-18: 400 mg via ORAL
  Filled 2019-01-18: qty 1

## 2019-01-18 MED ORDER — GUAIFENESIN-DM 100-10 MG/5ML PO SYRP: 5.0000 mL | ORAL_SOLUTION | ORAL | Status: DC | PRN

## 2019-01-18 MED ORDER — ORAL CARE MOUTH RINSE: 15.0000 mL | Freq: Two times a day (BID) | OROMUCOSAL | Status: DC

## 2019-01-18 MED ORDER — GABAPENTIN 800 MG PO TABS: 800.0000 mg | ORAL_TABLET | Freq: Three times a day (TID) | ORAL | Status: DC

## 2019-01-18 MED ORDER — ALBUTEROL SULFATE (2.5 MG/3ML) 0.083% IN NEBU: 3.0000 mL | INHALATION_SOLUTION | RESPIRATORY_TRACT | Status: DC | PRN

## 2019-01-18 MED ORDER — MOMETASONE FURO-FORMOTEROL FUM 200-5 MCG/ACT IN AERO
2.0000 | INHALATION_SPRAY | Freq: Two times a day (BID) | RESPIRATORY_TRACT | Status: DC
  Administered 2019-01-18: 2 via RESPIRATORY_TRACT
  Filled 2019-01-18: qty 8.8

## 2019-01-18 MED ORDER — UMECLIDINIUM BROMIDE 62.5 MCG/INH IN AEPB
1.0000 | INHALATION_SPRAY | Freq: Every day | RESPIRATORY_TRACT | Status: DC
  Administered 2019-01-18: 1 via RESPIRATORY_TRACT
  Filled 2019-01-18: qty 7

## 2019-01-18 MED ORDER — OXYCODONE HCL 5 MG PO TABS
5.0000 mg | ORAL_TABLET | ORAL | Status: DC | PRN
  Administered 2019-01-18 (×5): 5 mg via ORAL
  Filled 2019-01-18 (×5): qty 1

## 2019-01-18 MED ORDER — REGADENOSON 0.4 MG/5ML IV SOLN
0.4000 mg | Freq: Once | INTRAVENOUS | Status: AC
  Administered 2019-01-18: 0.4 mg via INTRAVENOUS

## 2019-01-18 MED ORDER — ONDANSETRON HCL 4 MG PO TABS: 4.0000 mg | ORAL_TABLET | Freq: Four times a day (QID) | ORAL | Status: DC | PRN

## 2019-01-18 MED ORDER — LEVOTHYROXINE SODIUM 25 MCG PO TABS
25.0000 ug | ORAL_TABLET | Freq: Every day | ORAL | Status: DC
  Administered 2019-01-18: 25 ug via ORAL
  Filled 2019-01-18: qty 1

## 2019-01-18 MED ORDER — IPRATROPIUM BROMIDE 0.02 % IN SOLN
2.5000 mL | Freq: Four times a day (QID) | RESPIRATORY_TRACT | Status: DC
  Administered 2019-01-18 (×2): 0.5 mg via RESPIRATORY_TRACT
  Filled 2019-01-18 (×3): qty 2.5

## 2019-01-18 MED ORDER — CLONAZEPAM 1 MG PO TABS
1.0000 mg | ORAL_TABLET | Freq: Two times a day (BID) | ORAL | Status: DC
  Administered 2019-01-18 (×2): 1 mg via ORAL
  Filled 2019-01-18 (×2): qty 1

## 2019-01-18 MED ORDER — ENOXAPARIN SODIUM 40 MG/0.4ML ~~LOC~~ SOLN
40.0000 mg | SUBCUTANEOUS | Status: DC
  Filled 2019-01-18: qty 0.4

## 2019-01-18 MED ORDER — IBUPROFEN 400 MG PO TABS
400.0000 mg | ORAL_TABLET | Freq: Once | ORAL | Status: AC
  Administered 2019-01-18: 400 mg via ORAL
  Filled 2019-01-18: qty 1

## 2019-01-18 MED ORDER — ROPINIROLE HCL 1 MG PO TABS: 1.0000 mg | ORAL_TABLET | Freq: Every day | ORAL | Status: DC

## 2019-01-18 MED ORDER — CYCLOBENZAPRINE HCL 10 MG PO TABS: 10.0000 mg | ORAL_TABLET | Freq: Three times a day (TID) | ORAL | Status: DC | PRN

## 2019-01-18 MED ORDER — ACETAMINOPHEN 325 MG PO TABS: 650.0000 mg | ORAL_TABLET | Freq: Four times a day (QID) | ORAL | Status: DC | PRN

## 2019-01-18 MED ORDER — ALBUTEROL SULFATE (2.5 MG/3ML) 0.083% IN NEBU
3.0000 mL | INHALATION_SOLUTION | RESPIRATORY_TRACT | Status: DC | PRN
  Administered 2019-01-18: 3 mL via RESPIRATORY_TRACT
  Filled 2019-01-18: qty 3

## 2019-01-18 MED ORDER — FLUTICASONE FUROATE-VILANTEROL 100-25 MCG/INH IN AEPB
1.0000 | INHALATION_SPRAY | Freq: Every day | RESPIRATORY_TRACT | Status: DC
  Administered 2019-01-18: 1 via RESPIRATORY_TRACT
  Filled 2019-01-18: qty 28

## 2019-01-18 NOTE — Progress Notes (Signed)
EMS is here to pick up patient to transport to home, discharge paper and instructions was given by the previous RN. Also PIV and telemetry monitor was discontinue with the previous RN.

## 2019-01-18 NOTE — TOC Transition Note (Signed)
Transition of Care [facility]) - CM/SW Discharge Note   Patient Details  Name: Mark [name] MRN: [id number] Date of Birth: [birth year]  [facility] CM/SW Contact:  Elza Rafter, RN Phone Number: 01/18/2019, 2:53 PM   Clinical Narrative:   Patient is discharging to home today.  He states he does not have a ride or his portable oxygen.  There is no one that can pick the oxygen or him up.  Patient is weak and needs oxygen.  Will DC with EMS.  Packet placed on chart.  He is independent from home.  Obtains medications at [facility] without difficulty.  Current with PCP. Declines HH services.  Has a walker and a cane at home.  He has a key to his home.  Oxygen in place at home; chronic 2-4L.      Final next level of care: Home/Self Care Barriers to Discharge: No Barriers Identified   Patient Goals and CMS Choice        Discharge Placement                       Discharge Plan and Services   Discharge Planning Services: CM Consult                      Social Determinants of Health (SDOH) Interventions     Readmission Risk Interventions No flowsheet data found.

## 2019-01-18 NOTE — Progress Notes (Signed)
EMS called at 1518 for transport home. I will continue to assess.

## 2019-01-18 NOTE — Progress Notes (Signed)
Refusing svn tx at this time.Told him to inform nursing if he would like tx administered

## 2019-01-18 NOTE — Progress Notes (Signed)
Patient seen in stress lab  No CP If test negative plan to d/c home  Note o follow

## 2019-01-18 NOTE — Discharge Summary (Signed)
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 Acute respiratory failure with hypoxia and hypercapnia ([place]) 05/01/2016  . Asthma   . COPD (chronic obstructive pulmonary disease) ([place])   . Osteoporosis     HOSPITAL COURSE:   68 year old male with a history of tobacco dependence and COPD who presented with chest pain.  1.  Chest pain: Patient was ruled out for ACS.  He underwent cardiac stress test. It shows EF 40% with no ischemia.   2.  COPD without signs of exacerbation  3.Tobacco dependence: Patient is encouraged to quit smoking and willing to attempt to quit was assessed. Patient NOT motivated.Counseling was provided for 4 minutes.  4.  Hypothyroidism: Continue Synthroid  DISCHARGE CONDITIONS AND DIET:   Stable Cardiac diet  CONSULTS OBTAINED:    DRUG ALLERGIES:  No Known Allergies  DISCHARGE MEDICATIONS:   Allergies as of 01/18/2019   No Known Allergies     Medication List    TAKE these medications   albuterol 108 (90 Base) MCG/ACT inhaler Commonly known as:  VENTOLIN HFA Inhale 2 puffs into the lungs every 4 (four) hours as needed for wheezing or shortness of breath.   aspirin 81 MG tablet Take 81 mg by mouth daily.   budesonide-formoterol 160-4.5 MCG/ACT inhaler Commonly known as:  SYMBICORT Inhale 2 puffs into the lungs 2 (two) times daily.   clonazePAM 1 MG  tablet Commonly known as:  KLONOPIN Take 1 mg by mouth 2 (two) times daily.   cyclobenzaprine 10 MG tablet Commonly known as:  FLEXERIL Take 10 mg by mouth 3 (three) times daily as needed for muscle spasms.   Incruse Ellipta 62.5 MCG/INH Aepb Generic drug:  umeclidinium bromide Inhale 1 puff into the lungs daily.   ipratropium 17 MCG/ACT inhaler Commonly known as:  ATROVENT HFA Inhale 2 puffs into the lungs every 6 (six) hours.   levothyroxine 25 MCG tablet Commonly known as:  SYNTHROID Take 25 mcg by mouth daily before breakfast.   Trelegy Ellipta 100-62.5-25 MCG/INH Aepb Generic drug:  Fluticasone-Umeclidin-Vilant Inhale 1 puff into the lungs daily.         Today   CHIEF COMPLAINT:  Denies chest pain Does not want to quit smoking   VITAL SIGNS:  Blood pressure (!) 115/55, pulse 80, temperature 97.6 F (36.4 C), temperature source Oral, resp. rate 18, height 6' (1.829 m), weight 74.3 kg, SpO2 100 %.   REVIEW OF SYSTEMS:  Review of Systems  Constitutional: Negative.  Negative for chills, fever and malaise/fatigue.  HENT: Negative.  Negative for ear discharge, ear pain, hearing loss, nosebleeds and sore throat.   Eyes: Negative.  Negative for blurred vision and pain.  Respiratory: Negative.  Negative for cough, hemoptysis, shortness of breath and wheezing.   Cardiovascular: Negative.  Negative for chest pain, palpitations and leg swelling.  Gastrointestinal: Negative.  Negative for abdominal pain, blood in stool, diarrhea,  nausea and vomiting.  Genitourinary: Negative.  Negative for dysuria.  Musculoskeletal: Negative.  Negative for back pain.  Skin: Negative.   Neurological: Negative for dizziness, tremors, speech change, focal weakness, seizures and headaches.  Endo/Heme/Allergies: Negative.  Does not bruise/bleed easily.  Psychiatric/Behavioral: Negative.  Negative for depression, hallucinations and suicidal ideas.     PHYSICAL EXAMINATION:  GENERAL:  68  y.o.-year-old patient lying in the bed with no acute distress.  NECK:  Supple, no jugular venous distention. No thyroid enlargement, no tenderness.  LUNGS: Normal breath sounds bilaterally, no wheezing, rales,rhonchi  No use of accessory muscles of respiration.  CARDIOVASCULAR: S1, S2 normal. No murmurs, rubs, or gallops.  ABDOMEN: Soft, non-tender, non-distended. Bowel sounds present. No organomegaly or mass.  EXTREMITIES: No pedal edema, cyanosis, or clubbing.  PSYCHIATRIC: The patient is alert and oriented x 3.  SKIN: No obvious rash, lesion, or ulcer.   DATA REVIEW:   CBC Recent Labs  Lab 01/18/19 0441  WBC 7.8  HGB 13.7  HCT 42.9  PLT 212    Chemistries  Recent Labs  Lab 01/17/19 1903 01/18/19 0441  NA  --  139  K  --  3.5  CL  --  103  CO2  --  29  GLUCOSE  --  93  BUN  --  15  CREATININE  --  0.86  CALCIUM  --  8.8*  AST 16  --   ALT 10  --   ALKPHOS 80  --   BILITOT 0.4  --     Cardiac Enzymes Recent Labs  Lab 01/17/19 1845 01/18/19 0023 01/18/19 0628  TROPONINI <0.03 <0.03 <0.03    Microbiology Results  @[email or web address] @  RADIOLOGY:  Dg Chest 1 View  Result Date: 01/17/2019 CLINICAL DATA:  Chest pain for several hours EXAM: CHEST  1 VIEW COMPARISON:  05/01/2016 FINDINGS: Cardiac shadow is stable. Chronic fibrotic changes are again seen throughout both lungs. No focal infiltrate or sizable effusion is seen. No acute bony abnormality is noted. IMPRESSION: Chronic changes without acute abnormality. Electronically Signed   By: Inez Catalina M.D.   On: 01/17/2019 19:49   Ct Angio Chest/abd/pel For Dissection W And/or Wo Contrast  Result Date: 01/17/2019 CLINICAL DATA:  Chest pain for several hours EXAM: CT ANGIOGRAPHY CHEST, ABDOMEN AND PELVIS TECHNIQUE: Multidetector CT imaging through the chest, abdomen and pelvis was performed using the standard protocol during bolus administration of intravenous contrast. Multiplanar reconstructed images and MIPs were  obtained and reviewed to evaluate the vascular anatomy. CONTRAST:  174mL [id number] COMPARISON:  05/01/2016 FINDINGS: CTA CHEST FINDINGS Cardiovascular: Thoracic aorta and its branches are within normal limits. Mild atherosclerotic calcifications are seen. No aneurysmal dilatation or dissection is noted. Heavy coronary calcifications are seen. No cardiac enlargement is noted. The pulmonary artery shows a normal branching pattern without evidence of filling defect to suggest pulmonary embolism. Mediastinum/Nodes: Thoracic inlet is within normal limits. Pretracheal lymph node is noted best seen on image number 28 of series 5 measuring 12 mm in short axis but significantly decreased from the prior exam at which time it measured 20 mm in short axis. No other sizable adenopathy is noted. The esophagus as visualized is within normal limits. Lungs/Pleura: Significant emphysematous changes are noted with biapical scarring left greater than right. No focal infiltrate is seen. No focal infiltrate or sizable effusion is seen. Stable right lower lobe nodule is noted on image number 69 of series 6. This is benign in etiology given its stability. Vague nodular  density is noted on image number 96 of series 6 in the right lower lobe adjacent to the bronchial tree. It is stable in appearance from the prior exam and also consistent with a benign etiology. No new nodular changes are seen. Mild fibrotic changes in the lower lobes are noted, sequelae from previous infiltrates. Musculoskeletal: Degenerative changes of the thoracic spine are noted. Changes of prior vertebral augmentation are noted at T11 and T12. No acute compression deformity is noted. Review of the MIP images confirms the above findings. CTA ABDOMEN AND PELVIS FINDINGS VASCULAR Aorta: Atherosclerotic calcifications are identified without aneurysmal dilatation. Celiac: Patent without evidence of aneurysm, dissection, vasculitis or significant stenosis. SMA: Patent  without evidence of aneurysm, dissection, vasculitis or significant stenosis. Renals: Both renal arteries are patent without evidence of aneurysm, dissection, vasculitis, fibromuscular dysplasia or significant stenosis. IMA: Patent without evidence of aneurysm, dissection, vasculitis or significant stenosis. Iliacs: Common iliac arteries demonstrate atherosclerotic calcifications bilaterally. Mild irregular plaque formation is noted in the left common iliac artery with mild stenotic changes. Veins: No specific vein abnormality is noted. Review of the MIP images confirms the above findings. NON-VASCULAR Hepatobiliary: No focal liver abnormality is seen. No gallstones, gallbladder wall thickening, or biliary dilatation. Pancreas: Unremarkable. No pancreatic ductal dilatation or surrounding inflammatory changes. Spleen: Normal in size without focal abnormality. Adrenals/Urinary Tract: Adrenal glands are within normal limits bilaterally. Kidneys are well visualize without renal calculi or obstructive changes. The bladder is well distended. Stomach/Bowel: Large right inguinal hernia is identified containing a portion of the ascending colon and cecum. Distal small bowel is noted as well. The appendix is within normal limits. No obstructive changes are noted secondary to this hernia. The stomach and small bowel are within normal limits. Lymphatic: No specific lymphadenopathy is identified. Reproductive: Prostate is unremarkable. Other: No abdominal wall hernia or abnormality. No abdominopelvic ascites. Musculoskeletal: Degenerative changes of lumbar spine are noted. Multilevel vertebral augmentation is noted at L1, L3 and L4. L2 compression deformity is noted stable from the prior exam. Facet hypertrophic changes are noted. Review of the MIP images confirms the above findings. IMPRESSION: No evidence of aortic dissection or aneurysmal dilatation. No evidence of pulmonary emboli. Chronic changes within the lungs bilaterally  improved when compared with the previous exam. Stable nodules are noted in the right lower lobe. Large right inguinal hernia containing a portion of the terminal ileum and ascending colon. The appendix is within normal limits. Electronically Signed   By: Inez Catalina M.D.   On: 01/17/2019 21:27      Allergies as of 01/18/2019   No Known Allergies     Medication List    TAKE these medications   albuterol 108 (90 Base) MCG/ACT inhaler Commonly known as:  VENTOLIN HFA Inhale 2 puffs into the lungs every 4 (four) hours as needed for wheezing or shortness of breath.   aspirin 81 MG tablet Take 81 mg by mouth daily.   budesonide-formoterol 160-4.5 MCG/ACT inhaler Commonly known as:  SYMBICORT Inhale 2 puffs into the lungs 2 (two) times daily.   clonazePAM 1 MG tablet Commonly known as:  KLONOPIN Take 1 mg by mouth 2 (two) times daily.   cyclobenzaprine 10 MG tablet Commonly known as:  FLEXERIL Take 10 mg by mouth 3 (three) times daily as needed for muscle spasms.   Incruse Ellipta 62.5 MCG/INH Aepb Generic drug:  umeclidinium bromide Inhale 1 puff into the lungs daily.   ipratropium 17 MCG/ACT inhaler Commonly known as:  ATROVENT HFA Inhale  2 puffs into the lungs every 6 (six) hours.   levothyroxine 25 MCG tablet Commonly known as:  SYNTHROID Take 25 mcg by mouth daily before breakfast.   Trelegy Ellipta 100-62.5-25 MCG/INH Aepb Generic drug:  Fluticasone-Umeclidin-Vilant Inhale 1 puff into the lungs daily.          Management plans discussed with the patient and he is in agreement. Stable for discharge home  Patient should follow up with pcp  CODE STATUS:     Code Status Orders  (From admission, onward)         Start     Ordered   01/18/19 0011  Full code  Continuous     01/18/19 0010        Code Status History    Date Active Date Inactive Code Status Order ID Comments User Context   05/01/2016 0934 05/03/2016 1455 Full Code [id number]  Harrie Foreman, MD Inpatient      TOTAL TIME TAKING CARE OF THIS PATIENT: 38 minutes.    Note: This dictation was prepared with Dragon dictation along with smaller phrase technology. Any transcriptional errors that result from this process are unintentional.  Bettey Costa M.D on 01/18/2019 at 11:39 AM  Between 7am to 6pm - Pager - [phone] After 6pm go to [email or web address] - password [facility]  Office  [phone]  CC: Primary care physician; Colford, Delcie Roch, MD

## 2019-01-18 NOTE — Progress Notes (Signed)
MD notified. Pt request advil for headache. Orders received. I will continue to assess.

## 2019-01-18 NOTE — Plan of Care (Signed)
  Problem: Clinical Measurements: Goal: Ability to maintain clinical measurements within normal limits will improve Outcome: Progressing   Problem: Activity: Goal: Risk for activity intolerance will decrease Outcome: Progressing   Problem: Pain Managment: Goal: General experience of comfort will improve Outcome: Progressing   Problem: Safety: Goal: Ability to remain free from injury will improve Outcome: Progressing   Problem: Activity: Goal: Ability to tolerate increased activity will improve Outcome: Progressing

## 2019-01-19 LAB — HIV ANTIBODY (ROUTINE TESTING W REFLEX): HIV Screen 4th Generation wRfx: NONREACTIVE

## 2020-06-18 ENCOUNTER — Emergency Department: Payer: Medicare Other

## 2020-06-18 ENCOUNTER — Encounter: Payer: Self-pay | Admitting: Emergency Medicine

## 2020-06-18 ENCOUNTER — Other Ambulatory Visit: Payer: Self-pay

## 2020-06-18 ENCOUNTER — Inpatient Hospital Stay
Admission: EM | Admit: 2020-06-18 | Discharge: 2020-07-09 | DRG: 177 | Disposition: A | Discharge status: Home Health | Payer: Medicare Other | Attending: Internal Medicine | Admitting: Internal Medicine

## 2020-06-18 DIAGNOSIS — J9602 Acute respiratory failure with hypercapnia: Secondary | ICD-10-CM | POA: Diagnosis present

## 2020-06-18 DIAGNOSIS — J439 Emphysema, unspecified: Secondary | ICD-10-CM | POA: Diagnosis present

## 2020-06-18 DIAGNOSIS — Z7189 Other specified counseling: Secondary | ICD-10-CM | POA: Diagnosis not present

## 2020-06-18 DIAGNOSIS — J9611 Chronic respiratory failure with hypoxia: Secondary | ICD-10-CM

## 2020-06-18 DIAGNOSIS — J850 Gangrene and necrosis of lung: Principal | ICD-10-CM | POA: Diagnosis present

## 2020-06-18 DIAGNOSIS — F4322 Adjustment disorder with anxiety: Secondary | ICD-10-CM | POA: Diagnosis present

## 2020-06-18 DIAGNOSIS — D473 Essential (hemorrhagic) thrombocythemia: Secondary | ICD-10-CM | POA: Diagnosis not present

## 2020-06-18 DIAGNOSIS — J449 Chronic obstructive pulmonary disease, unspecified: Secondary | ICD-10-CM | POA: Diagnosis not present

## 2020-06-18 DIAGNOSIS — T402X5A Adverse effect of other opioids, initial encounter: Secondary | ICD-10-CM | POA: Diagnosis present

## 2020-06-18 DIAGNOSIS — Z515 Encounter for palliative care: Secondary | ICD-10-CM

## 2020-06-18 DIAGNOSIS — E872 Acidosis: Secondary | ICD-10-CM | POA: Diagnosis present

## 2020-06-18 DIAGNOSIS — E876 Hypokalemia: Secondary | ICD-10-CM | POA: Diagnosis present

## 2020-06-18 DIAGNOSIS — B356 Tinea cruris: Secondary | ICD-10-CM | POA: Diagnosis present

## 2020-06-18 DIAGNOSIS — B370 Candidal stomatitis: Secondary | ICD-10-CM | POA: Diagnosis present

## 2020-06-18 DIAGNOSIS — J441 Chronic obstructive pulmonary disease with (acute) exacerbation: Secondary | ICD-10-CM

## 2020-06-18 DIAGNOSIS — Z79890 Hormone replacement therapy (postmenopausal): Secondary | ICD-10-CM

## 2020-06-18 DIAGNOSIS — B351 Tinea unguium: Secondary | ICD-10-CM | POA: Diagnosis present

## 2020-06-18 DIAGNOSIS — F419 Anxiety disorder, unspecified: Secondary | ICD-10-CM | POA: Diagnosis not present

## 2020-06-18 DIAGNOSIS — J9601 Acute respiratory failure with hypoxia: Secondary | ICD-10-CM | POA: Diagnosis present

## 2020-06-18 DIAGNOSIS — G2581 Restless legs syndrome: Secondary | ICD-10-CM | POA: Diagnosis present

## 2020-06-18 DIAGNOSIS — B3789 Other sites of candidiasis: Secondary | ICD-10-CM | POA: Diagnosis present

## 2020-06-18 DIAGNOSIS — Z79899 Other long term (current) drug therapy: Secondary | ICD-10-CM

## 2020-06-18 DIAGNOSIS — E039 Hypothyroidism, unspecified: Secondary | ICD-10-CM | POA: Diagnosis present

## 2020-06-18 DIAGNOSIS — Z66 Do not resuscitate: Secondary | ICD-10-CM | POA: Diagnosis present

## 2020-06-18 DIAGNOSIS — J984 Other disorders of lung: Secondary | ICD-10-CM

## 2020-06-18 DIAGNOSIS — L03113 Cellulitis of right upper limb: Secondary | ICD-10-CM | POA: Diagnosis present

## 2020-06-18 DIAGNOSIS — J9621 Acute and chronic respiratory failure with hypoxia: Secondary | ICD-10-CM | POA: Diagnosis present

## 2020-06-18 DIAGNOSIS — R21 Rash and other nonspecific skin eruption: Secondary | ICD-10-CM

## 2020-06-18 DIAGNOSIS — K219 Gastro-esophageal reflux disease without esophagitis: Secondary | ICD-10-CM | POA: Diagnosis present

## 2020-06-18 DIAGNOSIS — G894 Chronic pain syndrome: Secondary | ICD-10-CM | POA: Diagnosis present

## 2020-06-18 DIAGNOSIS — K5903 Drug induced constipation: Secondary | ICD-10-CM | POA: Diagnosis present

## 2020-06-18 DIAGNOSIS — Z20822 Contact with and (suspected) exposure to covid-19: Secondary | ICD-10-CM | POA: Diagnosis present

## 2020-06-18 DIAGNOSIS — J432 Centrilobular emphysema: Secondary | ICD-10-CM | POA: Diagnosis present

## 2020-06-18 DIAGNOSIS — Z7951 Long term (current) use of inhaled steroids: Secondary | ICD-10-CM

## 2020-06-18 DIAGNOSIS — J189 Pneumonia, unspecified organism: Secondary | ICD-10-CM

## 2020-06-18 DIAGNOSIS — M810 Age-related osteoporosis without current pathological fracture: Secondary | ICD-10-CM | POA: Diagnosis present

## 2020-06-18 DIAGNOSIS — J188 Other pneumonia, unspecified organism: Secondary | ICD-10-CM | POA: Diagnosis present

## 2020-06-18 DIAGNOSIS — R9389 Abnormal findings on diagnostic imaging of other specified body structures: Secondary | ICD-10-CM

## 2020-06-18 DIAGNOSIS — Z87891 Personal history of nicotine dependence: Secondary | ICD-10-CM

## 2020-06-18 DIAGNOSIS — J9622 Acute and chronic respiratory failure with hypercapnia: Secondary | ICD-10-CM | POA: Diagnosis present

## 2020-06-18 DIAGNOSIS — E669 Obesity, unspecified: Secondary | ICD-10-CM | POA: Diagnosis not present

## 2020-06-18 DIAGNOSIS — L304 Erythema intertrigo: Secondary | ICD-10-CM | POA: Diagnosis not present

## 2020-06-18 DIAGNOSIS — Z23 Encounter for immunization: Secondary | ICD-10-CM

## 2020-06-18 DIAGNOSIS — Z9981 Dependence on supplemental oxygen: Secondary | ICD-10-CM

## 2020-06-18 DIAGNOSIS — L408 Other psoriasis: Secondary | ICD-10-CM | POA: Diagnosis present

## 2020-06-18 DIAGNOSIS — K5900 Constipation, unspecified: Secondary | ICD-10-CM | POA: Diagnosis present

## 2020-06-18 DIAGNOSIS — Z7982 Long term (current) use of aspirin: Secondary | ICD-10-CM

## 2020-06-18 DIAGNOSIS — J438 Other emphysema: Secondary | ICD-10-CM | POA: Diagnosis not present

## 2020-06-18 DIAGNOSIS — I739 Peripheral vascular disease, unspecified: Secondary | ICD-10-CM | POA: Diagnosis present

## 2020-06-18 DIAGNOSIS — G9340 Encephalopathy, unspecified: Secondary | ICD-10-CM | POA: Diagnosis present

## 2020-06-18 DIAGNOSIS — J851 Abscess of lung with pneumonia: Secondary | ICD-10-CM | POA: Diagnosis present

## 2020-06-18 DIAGNOSIS — L539 Erythematous condition, unspecified: Secondary | ICD-10-CM | POA: Diagnosis not present

## 2020-06-18 DIAGNOSIS — H35389 Toxic maculopathy, unspecified eye: Secondary | ICD-10-CM | POA: Diagnosis not present

## 2020-06-18 LAB — BASIC METABOLIC PANEL
Anion gap: 16 — ABNORMAL HIGH (ref 5–15)
BUN: 20 mg/dL (ref 8–23)
CO2: 28 mmol/L (ref 22–32)
Calcium: 8.3 mg/dL — ABNORMAL LOW (ref 8.9–10.3)
Chloride: 90 mmol/L — ABNORMAL LOW (ref 98–111)
Creatinine, Ser: 0.69 mg/dL (ref 0.61–1.24)
GFR calc Af Amer: >60 mL/min (ref >60)
GFR calc non Af Amer: >60 mL/min (ref >60)
Glucose, Bld: 121 mg/dL — ABNORMAL HIGH (ref 70–99)
Potassium: 3.4 mmol/L — ABNORMAL LOW (ref 3.5–5.1)
Sodium: 134 mmol/L — ABNORMAL LOW (ref 135–145)

## 2020-06-18 LAB — CBC
HCT: 41.7 % (ref 39.0–52.0)
Hemoglobin: 14 g/dL (ref 13.0–17.0)
MCH: 29.4 pg (ref 26.0–34.0)
MCHC: 33.6 g/dL (ref 30.0–36.0)
MCV: 87.6 fL (ref 80.0–100.0)
Platelets: 474 10*3/uL — ABNORMAL HIGH (ref 150–400)
RBC: 4.76 MIL/uL (ref 4.22–5.81)
RDW: 13.7 % (ref 11.5–15.5)
WBC: 22.9 10*3/uL — ABNORMAL HIGH (ref 4.0–10.5)
nRBC: 0 % (ref 0.0–0.2)

## 2020-06-18 LAB — BLOOD GAS, VENOUS
Acid-Base Excess: 8.4 mmol/L — ABNORMAL HIGH (ref 0.0–2.0)
Bicarbonate: 35.3 mmol/L — ABNORMAL HIGH (ref 20.0–28.0)
O2 Saturation: 98.6 %
Patient temperature: 37
pCO2, Ven: 57 mmHg (ref 44.0–60.0)
pH, Ven: 7.4 (ref 7.250–7.430)
pO2, Ven: 119 mmHg — ABNORMAL HIGH (ref 32.0–45.0)

## 2020-06-18 LAB — TROPONIN I (HIGH SENSITIVITY): Troponin I (High Sensitivity): 26 ng/L — ABNORMAL HIGH (ref <18)

## 2020-06-18 LAB — LACTIC ACID, PLASMA: Lactic Acid, Venous: 1.1 mmol/L (ref 0.5–1.9)

## 2020-06-18 LAB — SARS CORONAVIRUS 2 BY RT PCR (HOSPITAL ORDER, PERFORMED IN ~~LOC~~ HOSPITAL LAB): SARS Coronavirus 2: NEGATIVE

## 2020-06-18 LAB — BRAIN NATRIURETIC PEPTIDE: B Natriuretic Peptide: 238.2 pg/mL — ABNORMAL HIGH (ref 0.0–100.0)

## 2020-06-18 MED ORDER — SODIUM CHLORIDE 0.9 % IV SOLN
3.0000 g | Freq: Four times a day (QID) | INTRAVENOUS | Status: DC
  Administered 2020-06-19 (×3): 3 g via INTRAVENOUS
  Filled 2020-06-18 (×3): qty 8

## 2020-06-18 MED ORDER — ALBUTEROL SULFATE (2.5 MG/3ML) 0.083% IN NEBU
3.0000 mL | INHALATION_SOLUTION | RESPIRATORY_TRACT | Status: DC | PRN
  Administered 2020-06-19 – 2020-06-25 (×3): 3 mL via RESPIRATORY_TRACT
  Filled 2020-06-18 (×4): qty 3

## 2020-06-18 MED ORDER — SODIUM CHLORIDE 0.9 % IV SOLN
1.0000 g | Freq: Once | INTRAVENOUS | Status: AC
  Administered 2020-06-18: 1 g via INTRAVENOUS
  Filled 2020-06-18: qty 1

## 2020-06-18 MED ORDER — GUAIFENESIN ER 600 MG PO TB12
1200.0000 mg | ORAL_TABLET | Freq: Two times a day (BID) | ORAL | Status: DC
  Administered 2020-06-18 – 2020-06-19 (×2): 1200 mg via ORAL
  Administered 2020-06-19: 600 mg via ORAL
  Administered 2020-06-20 – 2020-07-09 (×38): 1200 mg via ORAL
  Filled 2020-06-18 (×41): qty 2

## 2020-06-18 MED ORDER — METHYLPREDNISOLONE SODIUM SUCC 125 MG IJ SOLR
125.0000 mg | Freq: Once | INTRAMUSCULAR | Status: AC
  Administered 2020-06-18: 125 mg via INTRAVENOUS
  Filled 2020-06-18: qty 2

## 2020-06-18 MED ORDER — CLONAZEPAM 1 MG PO TABS
1.0000 mg | ORAL_TABLET | Freq: Two times a day (BID) | ORAL | Status: DC
  Administered 2020-06-18 – 2020-07-08 (×28): 1 mg via ORAL
  Filled 2020-06-18 (×12): qty 1
  Filled 2020-06-18: qty 2
  Filled 2020-06-18 (×3): qty 1
  Filled 2020-06-18: qty 2
  Filled 2020-06-18 (×3): qty 1
  Filled 2020-06-18: qty 2
  Filled 2020-06-18 (×14): qty 1

## 2020-06-18 MED ORDER — IPRATROPIUM-ALBUTEROL 0.5-2.5 (3) MG/3ML IN SOLN
9.0000 mL | Freq: Once | RESPIRATORY_TRACT | Status: AC
  Administered 2020-06-18: 9 mL via RESPIRATORY_TRACT
  Filled 2020-06-18: qty 9

## 2020-06-18 MED ORDER — ENOXAPARIN SODIUM 40 MG/0.4ML ~~LOC~~ SOLN
40.0000 mg | SUBCUTANEOUS | Status: DC
  Administered 2020-06-22 – 2020-07-04 (×4): 40 mg via SUBCUTANEOUS
  Filled 2020-06-18 (×12): qty 0.4

## 2020-06-18 MED ORDER — VANCOMYCIN HCL IN DEXTROSE 1-5 GM/200ML-% IV SOLN
1000.0000 mg | Freq: Once | INTRAVENOUS | Status: AC
  Administered 2020-06-18: 1000 mg via INTRAVENOUS
  Filled 2020-06-18: qty 200

## 2020-06-18 MED ORDER — LACTATED RINGERS IV BOLUS
1000.0000 mL | Freq: Once | INTRAVENOUS | Status: AC
  Administered 2020-06-18: 1000 mL via INTRAVENOUS

## 2020-06-18 MED ORDER — LEVOTHYROXINE SODIUM 25 MCG PO TABS
25.0000 ug | ORAL_TABLET | Freq: Every day | ORAL | Status: DC
  Administered 2020-06-19 – 2020-07-09 (×21): 25 ug via ORAL
  Filled 2020-06-18 (×21): qty 1

## 2020-06-18 MED ORDER — IOHEXOL 300 MG/ML  SOLN
75.0000 mL | Freq: Once | INTRAMUSCULAR | Status: AC | PRN
  Administered 2020-06-18: 75 mL via INTRAVENOUS

## 2020-06-18 MED ORDER — FLUTICASONE-UMECLIDIN-VILANT 100-62.5-25 MCG/INH IN AEPB: 1.0000 | INHALATION_SPRAY | Freq: Every day | RESPIRATORY_TRACT | Status: DC

## 2020-06-18 NOTE — ED Provider Notes (Addendum)
[facility] Emergency Department Provider Note   ____________________________________________   First MD Initiated Contact with Patient 06/18/20 2014     (approximate)  I have reviewed the triage vital signs and the nursing notes.   HISTORY  Chief Complaint Shortness of Breath    HPI Mark [name] is a 69 y.o. male with past medical history of COPD on 4 L and chronic pain who presents to the ED complaining of shortness of breath.  Patient states that he has been feeling short of breath for multiple months but it has increased over the past week.  He describes a cough productive of yellow and brownish sputum along with aching pain in his chest whenever he goes to cough or take a deep breath.  He has not had any fevers denies any sick contacts.  EMS was called to patient's home and noted his O2 sats dropped to 78% while he was on his 4 L and ambulating.  He was subsequently placed on nonrebreather with improvement.        Past Medical History:  Diagnosis Date  . Acute respiratory failure with hypoxia and hypercapnia ([place]) 05/01/2016  . Asthma   . COPD (chronic obstructive pulmonary disease) ([place])   . Osteoporosis     Patient Active Problem List   Diagnosis Date Noted  . Chest pain 01/17/2019  . Hypothyroidism 01/17/2019  . Hypokalemia 01/10/2017  . Elevated C-reactive protein (CRP) 01/10/2017  . Elevated sed rate 01/10/2017  . Chronic hip pain (Location of Tertiary source of pain) (Bilateral) (L>R) 01/10/2017  . Chronic pain syndrome 10/24/2016  . Chronic back pain (Location of Primary Source of Pain) (Bilateral) (R>L) 10/24/2016  . Disturbance of skin sensation 05/05/2016  . Peripheral neuropathy 05/05/2016  . Chronic knee pain (Bilateral) (L>R) 05/05/2016  . Chronic shoulder pain (Bilateral) (R>L) 05/05/2016  . Opioid-induced constipation (OIC) 05/05/2016  . Restless leg syndrome 05/05/2016  . Mediastinal adenopathy 05/05/2016  . Pulmonary  nodule (7 mm right lower lobe) 05/05/2016  . Inguinal hernia without obstruction (Right) 05/05/2016  . 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 Spasm of back muscles 05/05/2016  . Chronic musculoskeletal pain 05/05/2016  . Neurogenic pain 05/05/2016  . Neuropathic pain 05/05/2016  . Sacral back pain (Location of Secondary source of pain) (Bilateral) (L>R) 05/05/2016  . Logorrhea 05/05/2016  . Chronic lower extremity pain (Bilateral) (L>R) 05/05/2016  . Chronic neck pain (posterior) (L>R) 05/05/2016  . 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 Cervical facet hypertrophy (C2-3) (Bilateral) (L>R) 05/05/2016  . Cervical facet syndrome (Bilateral) (L>R) 05/05/2016  . 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) 05/05/2016  . Compression fracture of L2 ([place]) (seen on 11/25/2014 x-ray) 05/05/2016  . Anemia, chronic disease 05/05/2016  . Lumbar facet syndrome (Bilateral) (R>L) 05/05/2016  . Opiate use (75 MME/Day) 03/14/2016  . Long term prescription opiate use 03/14/2016  . Long term current use of opiate analgesic 03/14/2016  . Long term prescription benzodiazepine use 03/14/2016  . History of marijuana use 03/14/2016  . Compression fractures (L4, L3, L1, T12 and T11) 06/26/2015  . Acquired atrophy of thyroid 06/26/2015  . OP (osteoporosis) 06/26/2015  . Other long term (current) drug therapy 07/12/2013  . Pain medication agreement broken 07/12/2013  . Clinical depression 01/20/2005  . Esophagitis, reflux 06/01/2004  . Current tobacco use 06/01/2004  . Anxiety state 05/27/2004  . 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 BACK SURGERY      Prior to Admission medications   Medication Sig Start Date End Date Taking? Authorizing Provider  albuterol (PROVENTIL HFA;VENTOLIN HFA) 108 (90 Base)  MCG/ACT inhaler Inhale 2 puffs into the lungs every 4 (four) hours as needed for wheezing or shortness of breath.     [provider]  aspirin 81 MG tablet Take 81 mg by mouth daily.    [provider]  budesonide-formoterol (SYMBICORT) 160-4.5 MCG/ACT inhaler Inhale 2 puffs into the lungs 2 (two) times daily.    [provider]  clonazePAM (KLONOPIN) 1 MG tablet Take 1 mg by mouth 2 (two) times daily.    [provider]  cyclobenzaprine (FLEXERIL) 10 MG tablet Take 10 mg by mouth 3 (three) times daily as needed for muscle spasms.    [provider]  INCRUSE ELLIPTA 62.5 MCG/INH AEPB Inhale 1 puff into the lungs daily. 12/01/18   [provider]  ipratropium (ATROVENT HFA) 17 MCG/ACT inhaler Inhale 2 puffs into the lungs every 6 (six) hours.    [provider]  levothyroxine (SYNTHROID, LEVOTHROID) 25 MCG tablet Take 25 mcg by mouth daily before breakfast.    [provider]  TRELEGY ELLIPTA 100-62.5-25 MCG/INH AEPB Inhale 1 puff into the lungs daily. 12/03/18   [provider]    Allergies Patient has no known allergies.  History reviewed. No pertinent family history.  Social History Social History   Tobacco Use  . Smoking status: Former Research scientist (life sciences)  . Smokeless tobacco: Never Used  Substance Use Topics  . Alcohol use: No  . Drug use: No    Review of Systems  Constitutional: No fever/chills Eyes: No visual changes. ENT: No sore throat. Cardiovascular: Positive for chest pain. Respiratory: Positive for cough and shortness of breath. Gastrointestinal: No abdominal pain.  No nausea, no vomiting.  No diarrhea.  No constipation. Genitourinary: Negative for dysuria. Musculoskeletal: Negative for back pain. Skin: Negative for rash. Neurological: Negative for headaches, focal weakness or numbness.  ____________________________________________   PHYSICAL EXAM:  VITAL SIGNS: ED Triage Vitals  Enc Vitals  Group     BP 06/18/20 2004 (!) 153/90     Pulse Rate 06/18/20 2004 (!) 120     Resp 06/18/20 2004 (!) 21     Temp 06/18/20 2004 98.5 F (36.9 C)     Temp Source 06/18/20 2004 Oral     SpO2 06/18/20 2004 98 %     Weight 06/18/20 2006 180 lb (81.6 kg)     Height 06/18/20 2006 6' (1.829 m)     Head Circumference --      Peak Flow --      Pain Score 06/18/20 2004 7     Pain Loc --      Pain Edu? --      Excl. in [place]? --     Constitutional: Alert and oriented. Eyes: Conjunctivae are normal. Head: Atraumatic. Nose: No congestion/rhinnorhea. Mouth/Throat: Mucous membranes are moist. Neck: Normal ROM Cardiovascular: Tachycardic, regular rhythm. Grossly normal heart sounds. Respiratory: Tachypneic with increased respiratory effort.  No retractions.  Lung sounds diminished on left with expiratory wheezing throughout. Gastrointestinal: Soft and nontender. No distention. Genitourinary: deferred Musculoskeletal: No lower extremity tenderness nor edema. Neurologic:  Normal speech and language. No gross focal neurologic deficits are appreciated. Skin:  Skin is warm, dry and intact. No rash noted. Psychiatric: Mood and affect are normal. Speech and behavior are normal.  ____________________________________________   LABS (all labs ordered are listed, but only abnormal results are displayed)  Labs Reviewed  BASIC METABOLIC PANEL - Abnormal; Notable for the following components:      Result Value   Sodium 134 (*)    Potassium 3.4 (*)    Chloride 90 (*)    Glucose, Bld 121 (*)    Calcium 8.3 (*)    Anion gap 16 (*)    All other components within normal limits  CBC - Abnormal; Notable for the following components:   WBC 22.9 (*)    Platelets 474 (*)    All other components within normal limits  BRAIN NATRIURETIC PEPTIDE - Abnormal; Notable for the following components:   B Natriuretic Peptide 238.2 (*)    All other components within normal limits  BLOOD GAS, VENOUS - Abnormal;  Notable for the following components:   pO2, Ven 119.0 (*)    Bicarbonate 35.3 (*)    Acid-Base Excess 8.4 (*)    All other components within normal limits  TROPONIN I (HIGH SENSITIVITY) - Abnormal; Notable for the following components:   Troponin I (High Sensitivity) 26 (*)    All other components within normal limits  SARS CORONAVIRUS 2 BY RT PCR (HOSPITAL ORDER, [place] [facility])  CULTURE, BLOOD (ROUTINE X 2)  CULTURE, BLOOD (ROUTINE X 2)  LACTIC ACID, PLASMA  LACTIC ACID, PLASMA  TROPONIN I (HIGH SENSITIVITY)   ____________________________________________  EKG  ED ECG REPORT I, Blake Divine, the attending physician, personally viewed and interpreted this ECG.   Date: 06/18/2020  EKG Time: 20:03  Rate: 118  Rhythm: sinus tachycardia  Axis: LAD  Intervals:none  ST&T Change: None   PROCEDURES  Procedure(s) performed (including Critical Care):  .Critical Care Performed by: Blake Divine, MD Authorized by: Blake Divine, MD   Critical care provider statement:    Critical care time (minutes):  45   Critical care time was exclusive of:  Separately billable procedures and treating other patients and teaching time   Critical care was necessary to treat or prevent imminent or life-threatening deterioration of the following conditions:  Respiratory failure and sepsis   Critical care was time spent personally by me on the following activities:  Discussions with consultants, evaluation of patient's response to treatment, examination of patient, ordering and performing treatments and interventions, ordering and review of laboratory studies, ordering and review of radiographic studies, pulse oximetry, re-evaluation of patient's condition, obtaining history from patient or surrogate and review of old charts   I assumed direction of critical care for this patient from another provider in my specialty: no        ____________________________________________   INITIAL IMPRESSION / ASSESSMENT AND PLAN / ED COURSE       69 year old male with past medical history of COPD and chronic pain who presents to the ED with acute on chronic shortness of breath over the past week along with productive cough and chest pain.  EKG shows no evidence of arrhythmia or ischemia and I doubt ACS as troponin is only very mildly elevated.  Symptoms likely related to large left-sided pneumonia on chest x-ray along with element of COPD exacerbation.  He was treated with DuoNeb x3 as well as steroids and we will treat with broad-spectrum antibiotics given significant pneumonia with questionable cavitation.  We will further assess with CT with IV contrast as recommended by radiology.  He is now maintaining his O2 sats on his usual 4 L nasal cannula  after receiving duo nebs.  CT chest shows evidence of cavitary pneumonia and potential abscess.  We will obtain blood cultures and treat with cefepime and vancomycin.  Patient's respiratory status remained stable on his usual 4 L nasal cannula.  Case discussed with hospitalist for admission.      ____________________________________________   FINAL CLINICAL IMPRESSION(S) / ED DIAGNOSES  Final diagnoses:  Cavitary pneumonia  COPD exacerbation [facility]     ED Discharge Orders    None       Note:  This document was prepared using Dragon voice recognition software and may include unintentional dictation errors.   Blake Divine, MD 06/18/20 2300    Blake Divine, MD 07/29/20 1538

## 2020-06-18 NOTE — H&P (Signed)
History and Physical    [name] QQV:[id number] DOB: [birth year] DOA: 06/18/2020  PCP: Sandra Cockayne, MD  Patient coming from: Home  I have personally briefly reviewed patient's old medical records in [name]  Chief Complaint: Shortness of breath, purulent sputum  HPI: [name] is a 69 y.o. male with medical history significant for severe COPD and chronic respiratory failure with hypoxia on 4 L supplemental O2 via [place] chronically, hypothyroidism, and anxiety on chronic benzodiazepines who presents to the ED for evaluation of progressive shortness of breath.  Patient states he has been feeling unwell all summer long.  He says he has been intolerant to the heat and pre much confined to his apartment.  He has been getting progressively worsening shortness of breath and dyspnea on exertion.  At baseline he has a chronic cough usually productive of clear sputum however the last 2 weeks he has had purulent yellow malodorous sputum production.  He reports subjective fevers, chills, diaphoresis.  He is a former smoker and states he quit at the beginning of the COVID-19 pandemic.  He reports a history of right-sided lung abscess in 2009.  He says that he has become progressively weak and has difficulty getting out of bed recently to even go to the bathroom.  ED Course:  Initial vitals showed BP 153/90, pulse 120, RR 22, temp 98.5 Fahrenheit, SPO2 99% initially on nonrebreather.  He was weaned down to his home 4 L supplemental O2 via [place] maintaining O2 saturations >88%.  Labs showed WBC 22.9, hemoglobin 14.0, platelets 474,000, sodium 134, potassium 3.4, bicarb 28, BUN 20, creatinine 0.69, serum glucose 121, BNP 238.2, high-sensitivity troponin I 26.  VBG showed pH 7.4, PCO2 57, PO2 119.  Lactic acid 1.1.  SARS-CoV-2 PCR is negative.  Blood cultures were obtained and pending.  Portable chest x-ray showed consolidative opacity in the left midlung with cavitary changes.  CT  chest with contrast was obtained and showed chronic severe emphysema with superimposed left multilobar pneumonia and findings suggestive of necrotizing pneumonia with multiple areas suspicious for cavitation and suspected lung abscess lateral to the left hilum.  Patient was given IV vancomycin and cefepime, IV Solu-Medrol 125 mg, 1 L LR, and DuoNeb treatment.  The hospitalist service was consulted to admit for further evaluation and management.   Review of Systems: All systems reviewed and are negative except as documented in history of present illness above.   Past Medical History:  Diagnosis Date  . Acute respiratory failure with hypoxia and hypercapnia ([place]) 05/01/2016  . Asthma   . COPD (chronic obstructive pulmonary disease) ([facility])   . Osteoporosis     Past Surgical History:  Procedure Laterality Date  . BACK SURGERY      Social History:  reports that he has quit smoking. He has never used smokeless tobacco. He reports that he does not drink alcohol and does not use drugs.  No Known Allergies  Family History  Problem Relation Age of Onset  . Pulmonary fibrosis Mother      Prior to Admission medications   Medication Sig Start Date End Date Taking? Authorizing Provider  albuterol (PROVENTIL HFA;VENTOLIN HFA) 108 (90 Base) MCG/ACT inhaler Inhale 2 puffs into the lungs every 4 (four) hours as needed for wheezing or shortness of breath.     [provider]  aspirin 81 MG tablet Take 81 mg by mouth daily.    [provider]  budesonide-formoterol (SYMBICORT) 160-4.5 MCG/ACT inhaler Inhale 2 puffs into the  lungs 2 (two) times daily.    [provider]  clonazePAM (KLONOPIN) 1 MG tablet Take 1 mg by mouth 2 (two) times daily.    [provider]  cyclobenzaprine (FLEXERIL) 10 MG tablet Take 10 mg by mouth 3 (three) times daily as needed for muscle spasms.    [provider]  INCRUSE ELLIPTA 62.5 MCG/INH AEPB Inhale 1 puff into the lungs  daily. 12/01/18   [provider]  ipratropium (ATROVENT HFA) 17 MCG/ACT inhaler Inhale 2 puffs into the lungs every 6 (six) hours.    [provider]  levothyroxine (SYNTHROID, LEVOTHROID) 25 MCG tablet Take 25 mcg by mouth daily before breakfast.    [provider]  TRELEGY ELLIPTA 100-62.5-25 MCG/INH AEPB Inhale 1 puff into the lungs daily. 12/03/18   [provider]    Physical Exam: Vitals:   06/18/20 2004 06/18/20 2006 06/18/20 2100  BP: (!) 153/90  (!) 156/90  Pulse: (!) 120  (!) 114  Resp: (!) 21  19  Temp: 98.5 F (36.9 C)    TempSrc: Oral    SpO2: 98%  91%  Weight:  81.6 kg   Height:  6' (1.829 m)    Constitutional: Disheveled man resting supine in bed, NAD, calm, comfortable Eyes: PERRL, lids and conjunctivae normal ENMT: Mucous membranes are moist. Posterior pharynx clear of any exudate or lesions.  Neck: normal, supple, no masses. Respiratory: Inspiratory crackles left lung field, no active wheezing.  Normal respiratory effort. No accessory muscle use.  Cardiovascular: Regular rate and rhythm, no murmurs / rubs / gallops. No extremity edema. 2+ pedal pulses. Abdomen: no tenderness, no masses palpated. No hepatosplenomegaly. Bowel sounds positive.  Musculoskeletal: no clubbing / cyanosis. No joint deformity upper and lower extremities. Good ROM, no contractures. Normal muscle tone.  Skin: stasis dermatitis changes bilateral lower extremities, lesions, ulcers. No induration Neurologic: CN 2-12 grossly intact. Sensation intact, Strength 5/5 in all 4.  Psychiatric: Normal judgment and insight. Alert and oriented x 3. Normal mood.  Tangential speech.    Labs on Admission: I have personally reviewed following labs and imaging studies  CBC: Recent Labs  Lab 06/18/20 2014  WBC 22.9*  HGB 14.0  HCT 41.7  MCV 87.6  PLT 416*   Basic Metabolic Panel: Recent Labs  Lab 06/18/20 2014  NA 134*  K 3.4*  CL 90*  CO2 28  GLUCOSE 121*    BUN 20  CREATININE 0.69  CALCIUM 8.3*   GFR: Estimated Creatinine Clearance: 95.7 mL/min (by C-G formula based on SCr of 0.69 mg/dL). Liver Function Tests: No results for input(s): AST, ALT, ALKPHOS, BILITOT, PROT, ALBUMIN in the last 168 hours. No results for input(s): LIPASE, AMYLASE in the last 168 hours. No results for input(s): AMMONIA in the last 168 hours. Coagulation Profile: No results for input(s): INR, PROTIME in the last 168 hours. Cardiac Enzymes: No results for input(s): CKTOTAL, CKMB, CKMBINDEX, TROPONINI in the last 168 hours. BNP (last 3 results) No results for input(s): PROBNP in the last 8760 hours. HbA1C: No results for input(s): HGBA1C in the last 72 hours. CBG: No results for input(s): GLUCAP in the last 168 hours. Lipid Profile: No results for input(s): CHOL, HDL, LDLCALC, TRIG, CHOLHDL, LDLDIRECT in the last 72 hours. Thyroid Function Tests: No results for input(s): TSH, T4TOTAL, FREET4, T3FREE, THYROIDAB in the last 72 hours. Anemia Panel: No results for input(s): VITAMINB12, FOLATE, FERRITIN, TIBC, IRON, RETICCTPCT in the last 72 hours. Urine analysis:  Component Value Date/Time   COLORURINE YELLOW (A) 05/01/2016 0325   APPEARANCEUR CLEAR (A) 05/01/2016 0325   LABSPEC 1.018 05/01/2016 0325   PHURINE 5.0 05/01/2016 0325   GLUCOSEU NEGATIVE 05/01/2016 0325   HGBUR NEGATIVE 05/01/2016 0325   BILIRUBINUR NEGATIVE 05/01/2016 0325   KETONESUR NEGATIVE 05/01/2016 0325   PROTEINUR 30 (A) 05/01/2016 0325   NITRITE NEGATIVE 05/01/2016 0325   LEUKOCYTESUR NEGATIVE 05/01/2016 0325    Radiological Exams on Admission: CT Chest W Contrast  Result Date: 06/18/2020 CLINICAL DATA:  69 year old male with shortness of breath, productive cough, pneumonia. On home oxygen. EXAM: CT CHEST WITH CONTRAST TECHNIQUE: Multidetector CT imaging of the chest was performed during intravenous contrast administration. CONTRAST:  42mL OMNIPAQUE IOHEXOL 300 MG/ML  SOLN  COMPARISON:  Portable chest earlier today.  Chest CT 01/17/2020. FINDINGS: Cardiovascular: No cardiomegaly or pericardial effusion. Calcified coronary artery and aortic atherosclerosis. The central vascular structures of the mediastinum are enhancing and appear to be patent. Mediastinum/Nodes: Mildly increased in reactive appearing mediastinal lymph nodes compared to 2020, up to 14 mm short axis. Some nodes (right paratracheal on series 2, image 47) have not significantly changed. Additionally, there is evidence of a larger 15 mm short axis left AP window node. Lungs/Pleura: Chronic pulmonary hyperinflation. Extensive chronic emphysema. Previous bullous emphysema in the left upper lung where now there is extensive consolidation with numerous small rounded areas of opacity suspicious for cavitation. There is a prominent fluid level lateral to the left hilum on series 3, image 65 suspicious for lung abscess. Abnormal interstitial thickening and peribronchial nodularity throughout the left lung. No superimposed pneumothorax. Only trace left pleural fluid. The major airways remain patent. Right lung emphysema and markings appear stable since last year. Upper Abdomen: Negative visible liver, gallbladder, spleen, pancreas, adrenal glands, kidneys, and bowel in the upper abdomen. Musculoskeletal: Chronic compression fractures and ankylosis in the spine, with several levels of previous vertebral body augmentation, appears stable since last year. Occasional chronic rib fractures. No acute osseous abnormality identified. No chest wall abnormality. IMPRESSION: 1. 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 Consider necrotizing pneumonia as multiple areas are suspicious for cavitation, and especially a fluid-level lateral to the left hilum (series 2, image 68) strongly suggesting Lung Abscess. 2. Right lung unaffected.  Only trace left pleural fluid. 3. Reactive appearing mediastinal lymph nodes. 4.  Calcified coronary artery and Aortic Atherosclerosis (ICD10-I70.0). Emphysema (ICD10-J43.9). Electronically Signed   By: Genevie Ann M.D.   On: 06/18/2020 22:14   DG Chest Portable 1 View  Result Date: 06/18/2020 CLINICAL DATA:  Shortness of breath all summer lung, worsening last week, productive cough, congestion EXAM: PORTABLE CHEST 1 VIEW COMPARISON:  CT 01/17/2019, radiograph 01/17/2019 FINDINGS: There is a background chronic reticular and fibrotic interstitial changes throughout both lungs albeit with new area of superimposed consolidative opacity spanning the left mid lung to apex including an area of potential cavitation seen more peripherally. No discernible pneumothorax or visible effusion. Cardiomediastinal contours are partially obscured by overlying opacity with the visible borders similar in appearance to the comparison studies from prior. No acute osseous or soft tissue abnormality. Degenerative changes are present in the imaged spine and shoulders. Telemetry leads overlie the chest. IMPRESSION: Consolidative opacity in the left mid lung to apex with some questionable cavitation peripherally. Recommend further characterization with CT of the chest with contrast if patient is able to tolerate. Findings superimposed on chronic reticular and fibrotic interstitial changes. These results were called by telephone at the  time of interpretation on 06/18/2020 at 8:45 pm to provider [facility] , who verbally acknowledged these results. Electronically Signed   By: Lovena Le M.D.   On: 06/18/2020 20:45    EKG: Independently reviewed. Sinus tachycardia, rate 118, motion artifact.  Not significantly changed when compared to prior.  Assessment/Plan Principal Problem:   Necrotizing pneumonia (HCC) Active Problems:   Anxiety   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 (HCC)   Hypokalemia   Hypothyroidism   Abscess of left lung with pneumonia (HCC)  Chaise Mahabir is a 69  y.o. male with medical history significant for severe COPD and chronic respiratory failure with hypoxia on 4 L supplemental O2 via [place] chronically, hypothyroidism, and anxiety on chronic benzodiazepines who is admitted with left-sided necrotizing pneumonia with lung abscess.  Left-sided multilobar cavitary/necrotizing pneumonia with lung abscess: Seen on CT imaging.  Reports history of the same on the right side in 2009. -Start IV Unasyn 3 g q6h -Follow blood cultures, obtain sputum culture -Continue supplemental oxygen -Check strep pneumonia and Legionella urinary antigens -Will need eventual pulmonary consult  Severe COPD and chronic respiratory failure with hypoxia on 4 L O2 via [place] chronically: Chronic and appears stable after initial ED management. -Continue supplemental oxygen, currently on home 4 L via [place] -Continue Trelegy and albuterol as needed  Anxiety on chronic benzodiazepine: Continue home clonazepam 1 mg twice daily.  Hypokalemia: Replete and recheck in a.m.  Hypothyroidism: Continue home Synthroid.  Check TSH.  Generalized weakness and progressive deconditioning: Request PT eval.  DVT prophylaxis: Lovenox Code Status: Full code, confirmed with patient Family Communication: Discussed with patient, he has discussed with family Disposition Plan: From home, dispo pending PT eval Consults called: None Admission status:  Status is: Inpatient  Remains inpatient appropriate because:IV treatments appropriate due to intensity of illness or inability to take PO   Dispo: The patient is from: Home              Anticipated d/c is to: TBD pending PT eval              Anticipated d/c date is: 3 days              Patient currently is not medically stable to d/c.   Zada Finders MD [facility]  If 7PM-7AM, please contact night-coverage [email or web address]  06/19/2020, 12:20 AM  \

## 2020-06-18 NOTE — ED Triage Notes (Signed)
Pt to ED via EMS from home c/o SOB states all summer long but worse over last week and advised by family to come in d/t not wanting to feel like this anymore. Pt with productive yellow cough at home, congestion, wears 4L [place] chronically and was ambulatory on scene with EMS with O2 sats 78%, placed on 15L NRB and O2 up to 100%.  EMS vitals 159/100 BP, 122 HR, CBG 127.  Pt A&Ox4, continuously talking without difficulty, color WNL and improved per EMS.

## 2020-06-19 ENCOUNTER — Encounter: Payer: Self-pay | Admitting: Internal Medicine

## 2020-06-19 DIAGNOSIS — E876 Hypokalemia: Secondary | ICD-10-CM

## 2020-06-19 DIAGNOSIS — J439 Emphysema, unspecified: Secondary | ICD-10-CM | POA: Diagnosis not present

## 2020-06-19 DIAGNOSIS — J851 Abscess of lung with pneumonia: Secondary | ICD-10-CM

## 2020-06-19 DIAGNOSIS — J438 Other emphysema: Secondary | ICD-10-CM

## 2020-06-19 DIAGNOSIS — E669 Obesity, unspecified: Secondary | ICD-10-CM

## 2020-06-19 DIAGNOSIS — F419 Anxiety disorder, unspecified: Secondary | ICD-10-CM

## 2020-06-19 DIAGNOSIS — J189 Pneumonia, unspecified organism: Secondary | ICD-10-CM | POA: Diagnosis not present

## 2020-06-19 LAB — CBC
HCT: 40.9 % (ref 39.0–52.0)
Hemoglobin: 13.5 g/dL (ref 13.0–17.0)
MCH: 28.8 pg (ref 26.0–34.0)
MCHC: 33 g/dL (ref 30.0–36.0)
MCV: 87.2 fL (ref 80.0–100.0)
Platelets: 488 10*3/uL — ABNORMAL HIGH (ref 150–400)
RBC: 4.69 MIL/uL (ref 4.22–5.81)
RDW: 13.6 % (ref 11.5–15.5)
WBC: 20.3 10*3/uL — ABNORMAL HIGH (ref 4.0–10.5)
nRBC: 0 % (ref 0.0–0.2)

## 2020-06-19 LAB — BASIC METABOLIC PANEL
Anion gap: 13 (ref 5–15)
BUN: 20 mg/dL (ref 8–23)
CO2: 31 mmol/L (ref 22–32)
Calcium: 8.4 mg/dL — ABNORMAL LOW (ref 8.9–10.3)
Chloride: 91 mmol/L — ABNORMAL LOW (ref 98–111)
Creatinine, Ser: 0.72 mg/dL (ref 0.61–1.24)
GFR calc Af Amer: >60 mL/min (ref >60)
GFR calc non Af Amer: >60 mL/min (ref >60)
Glucose, Bld: 156 mg/dL — ABNORMAL HIGH (ref 70–99)
Potassium: 3.9 mmol/L (ref 3.5–5.1)
Sodium: 135 mmol/L (ref 135–145)

## 2020-06-19 LAB — EXPECTORATED SPUTUM ASSESSMENT W GRAM STAIN, RFLX TO RESP C

## 2020-06-19 LAB — STREP PNEUMONIAE URINARY ANTIGEN: Strep Pneumo Urinary Antigen: NEGATIVE

## 2020-06-19 LAB — TSH: TSH: 0.459 u[IU]/mL (ref 0.350–4.500)

## 2020-06-19 LAB — PROCALCITONIN: Procalcitonin: 0.81 ng/mL

## 2020-06-19 LAB — TROPONIN I (HIGH SENSITIVITY): Troponin I (High Sensitivity): 23 ng/L — ABNORMAL HIGH (ref <18)

## 2020-06-19 LAB — HIV ANTIBODY (ROUTINE TESTING W REFLEX): HIV Screen 4th Generation wRfx: NONREACTIVE

## 2020-06-19 MED ORDER — VANCOMYCIN HCL IN DEXTROSE 1-5 GM/200ML-% IV SOLN: 1000.0000 mg | Freq: Two times a day (BID) | INTRAVENOUS | Status: DC

## 2020-06-19 MED ORDER — POTASSIUM CHLORIDE 20 MEQ PO PACK
40.0000 meq | PACK | Freq: Once | ORAL | Status: AC
  Administered 2020-06-19: 40 meq via ORAL
  Filled 2020-06-19: qty 2

## 2020-06-19 MED ORDER — HYDROCOD POLST-CPM POLST ER 10-8 MG/5ML PO SUER
5.0000 mL | ORAL | Status: DC | PRN
  Administered 2020-06-19 – 2020-07-09 (×43): 5 mL via ORAL
  Filled 2020-06-19 (×46): qty 5

## 2020-06-19 MED ORDER — HYDROCOD POLST-CPM POLST ER 10-8 MG/5ML PO SUER
5.0000 mL | Freq: Two times a day (BID) | ORAL | Status: DC | PRN
  Administered 2020-06-19: 5 mL via ORAL
  Filled 2020-06-19 (×2): qty 5

## 2020-06-19 MED ORDER — KETOROLAC TROMETHAMINE 30 MG/ML IJ SOLN
30.0000 mg | Freq: Once | INTRAMUSCULAR | Status: AC
  Filled 2020-06-19: qty 1

## 2020-06-19 MED ORDER — VANCOMYCIN HCL IN DEXTROSE 1-5 GM/200ML-% IV SOLN
1000.0000 mg | Freq: Three times a day (TID) | INTRAVENOUS | Status: DC
  Administered 2020-06-20 – 2020-06-22 (×7): 1000 mg via INTRAVENOUS
  Filled 2020-06-19 (×10): qty 200

## 2020-06-19 MED ORDER — VANCOMYCIN HCL 1500 MG/300ML IV SOLN
1500.0000 mg | Freq: Once | INTRAVENOUS | Status: AC
  Administered 2020-06-19: 1500 mg via INTRAVENOUS
  Filled 2020-06-19: qty 300

## 2020-06-19 MED ORDER — METRONIDAZOLE IN NACL 5-0.79 MG/ML-% IV SOLN
500.0000 mg | Freq: Three times a day (TID) | INTRAVENOUS | Status: DC
  Administered 2020-06-19 – 2020-06-22 (×9): 500 mg via INTRAVENOUS
  Filled 2020-06-19 (×11): qty 100

## 2020-06-19 MED ORDER — UMECLIDINIUM BROMIDE 62.5 MCG/INH IN AEPB
1.0000 | INHALATION_SPRAY | Freq: Every day | RESPIRATORY_TRACT | Status: DC
  Administered 2020-06-19 – 2020-07-09 (×18): 1 via RESPIRATORY_TRACT
  Filled 2020-06-19 (×2): qty 7

## 2020-06-19 MED ORDER — SODIUM CHLORIDE 0.9 % IV SOLN
2.0000 g | INTRAVENOUS | Status: DC
  Administered 2020-06-19 – 2020-06-21 (×3): 2 g via INTRAVENOUS
  Filled 2020-06-19: qty 20
  Filled 2020-06-19: qty 2
  Filled 2020-06-19: qty 20
  Filled 2020-06-19: qty 2

## 2020-06-19 MED ORDER — FLUTICASONE FUROATE-VILANTEROL 100-25 MCG/INH IN AEPB
1.0000 | INHALATION_SPRAY | Freq: Every day | RESPIRATORY_TRACT | Status: DC
  Administered 2020-06-19 – 2020-06-22 (×4): 1 via RESPIRATORY_TRACT
  Filled 2020-06-19: qty 28

## 2020-06-19 NOTE — ED Notes (Signed)
Randol Kern NP notified of pt incompliance with oxygen therapy and oxygen saturation of 85 percent. No new orders at this time.

## 2020-06-19 NOTE — Consult Note (Addendum)
Pulmonary Medicine          Date: 06/19/2020,   MRN# [id number] Mark [name] 08-29-1951     AdmissionWeight: 81.6 kg                 CurrentWeight: 81.6 kg   Referring physician: Dr. Manuella Ghazi   CHIEF COMPLAINT:   Necrotizing pneumonia   HISTORY OF PRESENT ILLNESS   Is a pleasant 69 year old male with a history of chronic pain syndrome with opioid-induced constipation, GERD, advanced COPD chronic hypoxemia on 4 L/min supplemental oxygen home O2.  Also has a background history of hypothyroidism, peripheral neuropathy chronic lumbago, reports worsening respiratory status x2 to 3 months with signs and symptoms of acute COPD exacerbation with increased volume and darkening of phlegm on expectoration.  Most recently in the last 2 weeks prior to admission he developed chills diaphoresis and subjective fevers.  In the ER he was found to have leukocytosis, negative COVID-19 test x2, increased O2 requirement from 4 to 5 L/min nasal cannula however venous blood gas was essentially normal BNP and troponin also within reference range.  CT chest shows necrotizing pneumonia with consolidative infiltrate of the left upper and lower lung zones as well as fluid level cystic lesion suggestive of abscess with sorrounding emphysema.  Pulmonary consultation placed for further evaluation and management of necrotizing pneumonia.  He has crusted swollen erythematous feet with intertrigo, he has distended abdomen, poor dentition, and reports broken back. He shares that he has no family and when asked regarding medical proxy he states "im on my own".  Overall he looks chronically ill.    PAST MEDICAL HISTORY   Past Medical History:  Diagnosis Date  . Acute respiratory failure with hypoxia and hypercapnia ([place]) 05/01/2016  . Asthma   . COPD (chronic obstructive pulmonary disease) ([place])   . Osteoporosis      SURGICAL HISTORY   Past Surgical History:  Procedure Laterality Date  . BACK SURGERY        FAMILY HISTORY   Family History  Problem Relation Age of Onset  . Pulmonary fibrosis Mother      SOCIAL HISTORY   Social History   Tobacco Use  . Smoking status: Former Research scientist (life sciences)  . Smokeless tobacco: Never Used  Substance Use Topics  . Alcohol use: No  . Drug use: No     MEDICATIONS    Home Medication:  Current Outpatient Rx  . Order #: [id number] Class: Historical Med  . Order #: [id number] Class: Historical Med  . Order #: [id number] Class: Historical Med  . Order #: [id number] Class: Historical Med  . Order #: [id number] Class: Historical Med  . Order #: [id number] Class: Historical Med  . Order #: [id number] Class: Historical Med  . Order #: [id number] Class: Historical Med  . Order #: [id number] Class: Historical Med  . Order #: [id number] Class: Historical Med  . Order #: [id number] Class: Historical Med  . Order #: [id number] Class: Historical Med    Current Medication:  Current Facility-Administered Medications:  .  albuterol (PROVENTIL) (2.5 MG/3ML) 0.083% nebulizer solution 3 mL, 3 mL, Inhalation, Q4H PRN, Zada Finders R, MD, 3 mL at 06/19/20 1123 .  Ampicillin-Sulbactam (UNASYN) 3 g in sodium chloride 0.9 % 100 mL IVPB, 3 g, Intravenous, Q6H, Patel, Vishal R, MD, Last Rate: 200 mL/hr at 06/19/20 1123, 3 g at 06/19/20 1123 .  chlorpheniramine-HYDROcodone (TUSSIONEX) 10-8 MG/5ML suspension 5 mL, 5 mL, Oral, Q12H PRN, Lenore Cordia, MD, 5 mL at 06/19/20 0049 .  clonazePAM (KLONOPIN) tablet 1 mg,  1 mg, Oral, BID, Zada Finders R, MD, 1 mg at 06/19/20 0938 .  enoxaparin (LOVENOX) injection 40 mg, 40 mg, Subcutaneous, Q24H, Patel, Vishal R, MD .  fluticasone furoate-vilanterol (BREO ELLIPTA) 100-25 MCG/INH 1 puff, 1 puff, Inhalation, Daily, 1 puff at 06/19/20 0936 **AND** umeclidinium bromide (INCRUSE ELLIPTA) 62.5 MCG/INH 1 puff, 1 puff, Inhalation, Daily, Lu Duffel, RPH, 1 puff at 06/19/20 0937 .  guaiFENesin (MUCINEX) 12 hr tablet 1,200 mg, 1,200 mg, Oral, BID,  Zada Finders R, MD, 600 mg at 06/19/20 0939 .  levothyroxine (SYNTHROID) tablet 25 mcg, 25 mcg, Oral, QAC breakfast, Zada Finders R, MD, 25 mcg at 06/19/20 0518  Current Outpatient Medications:  .  albuterol (PROVENTIL HFA;VENTOLIN HFA) 108 (90 Base) MCG/ACT inhaler, Inhale 2 puffs into the lungs every 4 (four) hours as needed for wheezing or shortness of breath. , Disp: , Rfl:  .  aspirin 81 MG tablet, Take 81 mg by mouth daily., Disp: , Rfl:  .  budesonide-formoterol (SYMBICORT) 160-4.5 MCG/ACT inhaler, Inhale 2 puffs into the lungs 2 (two) times daily., Disp: , Rfl:  .  clonazePAM (KLONOPIN) 1 MG tablet, Take 1 mg by mouth 2 (two) times daily., Disp: , Rfl:  .  cyclobenzaprine (FLEXERIL) 10 MG tablet, Take 10 mg by mouth 3 (three) times daily as needed for muscle spasms., Disp: , Rfl:  .  diphenhydrAMINE (BENADRYL) 25 mg capsule, Take 25-50 mg by mouth every 6 (six) hours as needed for allergies. , Disp: , Rfl:  .  EPINEPHrine (PRIMATENE MIST) 0.125 MG/ACT AERO, Inhale 1 Dose into the lungs as needed (allergies)., Disp: , Rfl:  .  fluticasone (FLONASE) 50 MCG/ACT nasal spray, Place 2 sprays into both nostrils daily as needed for allergies. , Disp: , Rfl:  .  ipratropium (ATROVENT HFA) 17 MCG/ACT inhaler, Inhale 2 puffs into the lungs every 6 (six) hours., Disp: , Rfl:  .  levothyroxine (SYNTHROID, LEVOTHROID) 25 MCG tablet, Take 25 mcg by mouth daily before breakfast., Disp: , Rfl:  .  tiotropium (SPIRIVA) 18 MCG inhalation capsule, Place 18 mcg into inhaler and inhale daily., Disp: , Rfl:  .  TRELEGY ELLIPTA 100-62.5-25 MCG/INH AEPB, Inhale 1 puff into the lungs daily., Disp: , Rfl:     ALLERGIES   Patient has no known allergies.     REVIEW OF SYSTEMS    Review of Systems:  Gen:  Denies  fever, sweats, chills weigh loss  HEENT: Denies blurred vision, double vision, ear pain, eye pain, hearing loss, nose bleeds, sore throat Cardiac:  No dizziness, chest pain or heaviness, chest  tightness,edema Resp:   Denies cough or sputum porduction, shortness of breath,wheezing, hemoptysis,  Gi: Denies swallowing difficulty, stomach pain, nausea or vomiting, diarrhea, constipation, bowel incontinence Gu:  Denies bladder incontinence, burning urine Ext:   Denies Joint pain, stiffness or swelling Skin: Denies  skin rash, easy bruising or bleeding or hives Endoc:  Denies polyuria, polydipsia , polyphagia or weight change Psych:   Denies depression, insomnia or hallucinations   Other:  All other systems negative   VS: BP (!) 158/91   Pulse (!) 108   Temp 98.4 F (36.9 C) (Oral)   Resp 18   Ht 6' (1.829 m)   Wt 81.6 kg   SpO2 93%   BMI 24.41 kg/m      PHYSICAL EXAM    GENERAL:NAD, no fevers, chills, no weakness no fatigue HEAD: Normocephalic, atraumatic.  EYES: Pupils equal, round, reactive to light. Extraocular muscles intact.  No scleral icterus.  MOUTH: Moist mucosal membrane. Dentition intact. No abscess noted.  EAR, NOSE, THROAT: Clear without exudates. No external lesions.  NECK: Supple. No thyromegaly. No nodules. No JVD.  PULMONARY: Diffuse coarse rhonchi right sided +wheezes CARDIOVASCULAR: S1 and S2. Regular rate and rhythm. No murmurs, rubs, or gallops. No edema. Pedal pulses 2+ bilaterally.  GASTROINTESTINAL: Soft, nontender, nondistended. No masses. Positive bowel sounds. No hepatosplenomegaly.  MUSCULOSKELETAL: No swelling, clubbing, or edema. Range of motion full in all extremities.  NEUROLOGIC: Cranial nerves II through XII are intact. No gross focal neurological deficits. Sensation intact. Reflexes intact.  SKIN: crusted feet with erythemaouts intertrigo. PSYCHIATRIC: Mood, affect within normal limits. The patient is awake, alert and oriented x 3. Insight, judgment intact.       IMAGING    CT Chest W Contrast  Result Date: 06/18/2020 CLINICAL DATA:  69 year old male with shortness of breath, productive cough, pneumonia. On home oxygen. EXAM:  CT CHEST WITH CONTRAST TECHNIQUE: Multidetector CT imaging of the chest was performed during intravenous contrast administration. CONTRAST:  64mL OMNIPAQUE IOHEXOL 300 MG/ML  SOLN COMPARISON:  Portable chest earlier today.  Chest CT 01/17/2020. FINDINGS: Cardiovascular: No cardiomegaly or pericardial effusion. Calcified coronary artery and aortic atherosclerosis. The central vascular structures of the mediastinum are enhancing and appear to be patent. Mediastinum/Nodes: Mildly increased in reactive appearing mediastinal lymph nodes compared to 2020, up to 14 mm short axis. Some nodes (right paratracheal on series 2, image 47) have not significantly changed. Additionally, there is evidence of a larger 15 mm short axis left AP window node. Lungs/Pleura: Chronic pulmonary hyperinflation. Extensive chronic emphysema. Previous bullous emphysema in the left upper lung where now there is extensive consolidation with numerous small rounded areas of opacity suspicious for cavitation. There is a prominent fluid level lateral to the left hilum on series 3, image 65 suspicious for lung abscess. Abnormal interstitial thickening and peribronchial nodularity throughout the left lung. No superimposed pneumothorax. Only trace left pleural fluid. The major airways remain patent. Right lung emphysema and markings appear stable since last year. Upper Abdomen: Negative visible liver, gallbladder, spleen, pancreas, adrenal glands, kidneys, and bowel in the upper abdomen. Musculoskeletal: Chronic compression fractures and ankylosis in the spine, with several levels of previous vertebral body augmentation, appears stable since last year. Occasional chronic rib fractures. No acute osseous abnormality identified. No chest wall abnormality. IMPRESSION: 1. 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 Consider necrotizing pneumonia as multiple areas are suspicious for cavitation, and especially a fluid-level lateral to  the left hilum (series 2, image 68) strongly suggesting Lung Abscess. 2. Right lung unaffected.  Only trace left pleural fluid. 3. Reactive appearing mediastinal lymph nodes. 4. Calcified coronary artery and Aortic Atherosclerosis (ICD10-I70.0). Emphysema (ICD10-J43.9). Electronically Signed   By: Genevie Ann M.D.   On: 06/18/2020 22:14   DG Chest Portable 1 View  Result Date: 06/18/2020 CLINICAL DATA:  Shortness of breath all summer lung, worsening last week, productive cough, congestion EXAM: PORTABLE CHEST 1 VIEW COMPARISON:  CT 01/17/2019, radiograph 01/17/2019 FINDINGS: There is a background chronic reticular and fibrotic interstitial changes throughout both lungs albeit with new area of superimposed consolidative opacity spanning the left mid lung to apex including an area of potential cavitation seen more peripherally. No discernible pneumothorax or visible effusion. Cardiomediastinal contours are partially obscured by overlying opacity with the visible borders similar in appearance to the comparison studies from prior. No acute osseous or soft tissue abnormality. Degenerative changes are present  in the imaged spine and shoulders. Telemetry leads overlie the chest. IMPRESSION: Consolidative opacity in the left mid lung to apex with some questionable cavitation peripherally. Recommend further characterization with CT of the chest with contrast if patient is able to tolerate. Findings superimposed on chronic reticular and fibrotic interstitial changes. These results were called by telephone at the time of interpretation on 06/18/2020 at 8:45 pm to provider [facility] , who verbally acknowledged these results. Electronically Signed   By: Lovena Le M.D.   On: 06/18/2020 20:45      ASSESSMENT/PLAN   Left lung necrortizing pneumonia with abscess -patient with poor dentition possible anaerobic etiology -will send off sputum cultures patient states he can cough phlegm -he is altered with  encephalopathy -sputum culture x 3 -AFB sputum -histo/strep pneumo/legionella urine ag -blood cultures -MRSA nasal PCR -procalcitonin trend -defer antimicrobial regimen to ID - currently on Unasyn -poor prognosis - palliative evaluation    Advanced COPD with centrilobular emphysema  - chronic hypoxemia with increased O2 requirement  -his emphysema is beyond repair and patient has very poor prognosis -typical COPD carepath with duoneb, bronchopulmonary hygiene, steroids and antibiotics is appropriate for inpatient therapy  Altered mental status with confusion   - possible septic encephalitis, patient admits to drinking alcohol  - VBG does not support hypercarbic encephalopathy   Lower extermity foot wounds  - wound care  And podiatry evaluation - possible  Cellulitis/intertrigo      Thank you for allowing me to participate in the care of this patient.   Patient/Family are satisfied with care plan and all questions have been answered.  This document was prepared using Dragon voice recognition software and may include unintentional dictation errors.     Ottie Glazier, M.D.  Division of [place]

## 2020-06-19 NOTE — ED Notes (Addendum)
Admitting Md at bedside. Pt stating "I am tired of this beeping, do I really need this monitor." Pt then proceeds to pull all of his leads off, BP cuff and pulse ox. MD explaining the reasoning for the monitoring.

## 2020-06-19 NOTE — Progress Notes (Addendum)
PT Cancellation Note  Patient Details Name: Mark [name] MRN: [id number] DOB: 25-Jun-1951   Cancelled Treatment:    Reason Eval/Treat Not Completed: Other (comment). Pt extremely verbose throughout attempt this AM, requested a breathing treatment and follow up information about his ability to receive the covid vaccine while he is admitted. Breakfast also arrived during PT attempt, pt repositioned and eager to eat breakfast prior to any mobility attempts. RN notified of pt status/requests.   Lieutenant Diego PT, DPT 9:52 AM,06/19/20

## 2020-06-19 NOTE — Consult Note (Addendum)
Pharmacy Antibiotic Note  Mark [name] is a 69 y.o. male admitted on 06/18/2020 with lung abscess - left lung necrotizing pneumonia with cavitation chronic- mid lung - very likely aspiration. Patient presented with shortness of breath and purulent sputum. PMH includes COPD and chronic respiratory failure on 4L O2, osteoporosis, and asthma. Pharmacy has been consulted for vancomycin dosing.  Plan: Vancomycin 1500 mg IV x 1 dose - not giving full loading dose due to pt receiving 1000 mg IV x 1 on 9/16 @ 2200, followed by vancomycin 1000 mg IV every 8 hours.   Height: 6' (182.9 cm) Weight: 81.6 kg (180 lb) IBW/kg (Calculated) : 77.6  Temp (24hrs), Avg:98.5 F (36.9 C), Min:98.4 F (36.9 C), Max:98.5 F (36.9 C)  Recent Labs  Lab 06/18/20 2014 06/18/20 2054 06/19/20 0506  WBC 22.9*  --  20.3*  CREATININE 0.69  --  0.72  LATICACIDVEN  --  1.1  --     Estimated Creatinine Clearance: 95.7 mL/min (by C-G formula based on SCr of 0.72 mg/dL).    No Known Allergies  Antimicrobials this admission: 9/16 vancomycin >> 9/17 metfronidazole >> 9/17 ceftriaxone >> 9/16 cefepime x 1  9/17 Unasyn x 1  Microbiology results: 9/16 BCx: NGTD 9/17 Sputum: pending 9/17 MRSA PCR: pending  Thank you for allowing pharmacy to be a part of this patient's care.  Benn Moulder, PharmD Pharmacy Resident  06/19/2020 5:12 PM

## 2020-06-19 NOTE — ED Notes (Signed)
Left hand IV dressing coming off. Dressing changed. Clean, dry, and intact.

## 2020-06-19 NOTE — ED Notes (Addendum)
This RN at bedside. Pt calling our again for his cough syrup. This RN explained again that the cough medicine is scheduled Q12hrs PRN and it was last administered at [place]. Pt stating, "That is ridiculous, it is usually Q4hrs. This RN explaining that the dosage and frequency can differ based on the MD's assessment. Pt stating, "That is absurd, you need change that." This RN stating the MD would be messaged for pt's concerns.

## 2020-06-19 NOTE — Consult Note (Signed)
PODIATRY / FOOT AND ANKLE SURGERY CONSULTATION NOTE  Requesting Physician: Dr. Lanney Gins  Reason for consult: Foot redness both, long nails  Chief Complaint: Foot redness, lack of hygiene   HPI: Mark [name] is a 69 y.o. male who presents to the ED due to shortness of breath.  Patient has a known history of severe PVD and chronic respiratory failure with hypoxia on 4 L of oxygen chronically.  Patient was admitted for this issue.  Patient has been seen by pulmonology and has been diagnosed with left lung necrotizing pneumonia with abscess.  Patient's prognosis overall is poor and has been recommended mended to have palliative education.  Patient also appears to have altered mental status with confusion.  Patient states he has neglected care for himself overall and is loud his feet also get out-of-control and she has extremely long toenails as well as redness and cracks in the skin.  Patient denies being diabetes but does state that he has chronic back issues when she has some numbness to his feet.  PMHx:  Past Medical History:  Diagnosis Date  . Acute respiratory failure with hypoxia and hypercapnia ([place]) 05/01/2016  . Asthma   . COPD (chronic obstructive pulmonary disease) ([place])   . Osteoporosis     Surgical Hx:  Past Surgical History:  Procedure Laterality Date  . BACK SURGERY      FHx:  Family History  Problem Relation Age of Onset  . Pulmonary fibrosis Mother     Social History:  reports that he has quit smoking. He has never used smokeless tobacco. He reports that he does not drink alcohol and does not use drugs.  Allergies: No Known Allergies  Review of Systems: Psychological ROS: positive for - anxiety Respiratory ROS: positive for - cough and shortness of breath Cardiovascular ROS: no chest pain or dyspnea on exertion Gastrointestinal ROS: no abdominal pain, change in bowel habits, or black or bloody stools Musculoskeletal ROS: positive for - gait disturbance, joint  pain, joint stiffness and joint swelling Neurological ROS: positive for - numbness/tingling Dermatological ROS: negative  (Not in a hospital admission)   Physical Exam: General: Alert and oriented.  No apparent distress.  Vascular: DP/PT pulses palpable bilateral.  Capillary fill time appears to be intact to digits less than 3 seconds bilateral.  Mild bilateral lower extremity nonpitting edema present.  Patient has rubor with dependence which gets better with elevation of the feet bilateral.  Feet are warm to touch.  Neuro: Light touch sensation reduced to digits bilateral.  Derm: Dry cracking skin to both feet circumferentially.  Nails appear to be extremely thickened, elongated, dystrophic and brittle with subungual debris x10.  No open ulcerations are present.  Patient has erythema present mainly to the left forefoot but to a lesser extent to the right forefoot which is reduced with elevation.      MSK: 4/5 strength bilateral lower extremity muscle groups.  No pain on palpation of both feet.  Results for orders placed or performed during the hospital encounter of 06/18/20 (from the past 48 hour(s))  Basic metabolic panel     Status: Abnormal   Collection Time: 06/18/20  8:14 PM  Result Value Ref Range   Sodium 134 (L) 135 - 145 mmol/L   Potassium 3.4 (L) 3.5 - 5.1 mmol/L   Chloride 90 (L) 98 - 111 mmol/L   CO2 28 22 - 32 mmol/L   Glucose, Bld 121 (H) 70 - 99 mg/dL    Comment: Glucose reference range  applies only to samples taken after fasting for at least 8 hours.   BUN 20 8 - 23 mg/dL   Creatinine, Ser 0.69 0.61 - 1.24 mg/dL   Calcium 8.3 (L) 8.9 - 10.3 mg/dL   GFR calc non Af Amer >60 >60 mL/min   GFR calc Af Amer >60 >60 mL/min   Anion gap 16 (H) 5 - 15    Comment: Performed at [facility], [address] [place], [place] [address]  CBC     Status: Abnormal   Collection Time: 06/18/20  8:14 PM  Result Value Ref Range   WBC 22.9 (H) 4.0 - 10.5 K/uL   RBC  4.76 4.22 - 5.81 MIL/uL   Hemoglobin 14.0 13.0 - 17.0 g/dL   HCT 41.7 39 - 52 %   MCV 87.6 80.0 - 100.0 fL   MCH 29.4 26.0 - 34.0 pg   MCHC 33.6 30.0 - 36.0 g/dL   RDW 13.7 11.5 - 15.5 %   Platelets 474 (H) 150 - 400 K/uL   nRBC 0.0 0.0 - 0.2 %    Comment: Performed at [facility], [place], [place] [address]  Troponin I (High Sensitivity)     Status: Abnormal   Collection Time: 06/18/20  8:14 PM  Result Value Ref Range   Troponin I (High Sensitivity) 26 (H) <18 ng/L    Comment: (NOTE) Elevated high sensitivity troponin I (hsTnI) values and significant  changes across serial measurements may suggest ACS but many other  chronic and acute conditions are known to elevate hsTnI results.  Refer to the "Links" section for chest pain algorithms and additional  guidance. Performed at [facility], [address] [place], [place] [address]   Brain natriuretic peptide     Status: Abnormal   Collection Time: 06/18/20  8:14 PM  Result Value Ref Range   B Natriuretic Peptide 238.2 (H) 0.0 - 100.0 pg/mL    Comment: Performed at [facility], [address] [place], [place] [address]  Blood gas, venous     Status: Abnormal   Collection Time: 06/18/20  8:28 PM  Result Value Ref Range   pH, Ven 7.40 7.25 - 7.43   pCO2, Ven 57 44 - 60 mmHg   pO2, Ven 119.0 (H) 32 - 45 mmHg   Bicarbonate 35.3 (H) 20.0 - 28.0 mmol/L   Acid-Base Excess 8.4 (H) 0.0 - 2.0 mmol/L   O2 Saturation 98.6 %   Patient temperature 37.0    Collection site LINE    Sample type VENOUS     Comment: Performed at [facility], [address], [place], [place] [address]  SARS Coronavirus 2 by RT PCR (hospital order, performed in [place] hospital lab) Nasopharyngeal Nasopharyngeal Swab     Status: None   Collection Time: 06/18/20  8:53 PM   Specimen: Nasopharyngeal Swab  Result Value Ref Range   SARS Coronavirus 2 NEGATIVE NEGATIVE    Comment: (NOTE) SARS-CoV-2  target nucleic acids are NOT DETECTED.  The SARS-CoV-2 RNA is generally detectable in upper and lower respiratory specimens during the acute phase of infection. The lowest concentration of SARS-CoV-2 viral copies this assay can detect is 250 copies / mL. A negative result does not preclude SARS-CoV-2 infection and should not be used as the sole basis for treatment or other patient management decisions.  A negative result may occur with improper specimen collection / handling, submission of specimen other than nasopharyngeal swab, presence of  viral mutation(s) within the areas targeted by this assay, and inadequate number of viral copies (<250 copies / mL). A negative result must be combined with clinical observations, patient history, and epidemiological information.  Fact Sheet for Patients:   StrictlyIdeas.no  Fact Sheet for Healthcare Providers: BankingDealers.co.za  This test is not yet approved or  cleared by the [place] FDA and has been authorized for detection and/or diagnosis of SARS-CoV-2 by FDA under an Emergency Use Authorization (EUA).  This EUA will remain in effect (meaning this test can be used) for the duration of the COVID-19 declaration under Section 564(b)(1) of the Act, 21 U.S.C. section 360bbb-3(b)(1), unless the authorization is terminated or revoked sooner.  Performed at [facility], [address] [place], [place] [address]   Culture, blood (routine x 2)     Status: None (Preliminary result)   Collection Time: 06/18/20  8:54 PM   Specimen: BLOOD  Result Value Ref Range   Specimen Description BLOOD RIGHT FA    Special Requests      BOTTLES DRAWN AEROBIC AND ANAEROBIC Blood Culture adequate volume   Culture      NO GROWTH < 12 HOURS Performed at [facility], [address], [place], [place] [address]    Report Status PENDING   Culture, blood (routine x 2)     Status: None  (Preliminary result)   Collection Time: 06/18/20  8:54 PM   Specimen: BLOOD  Result Value Ref Range   Specimen Description BLOOD LEFT FA    Special Requests      BOTTLES DRAWN AEROBIC AND ANAEROBIC Blood Culture adequate volume   Culture      NO GROWTH < 12 HOURS Performed at [facility], [address] [place], [place] [address]    Report Status PENDING   Lactic acid, plasma     Status: None   Collection Time: 06/18/20  8:54 PM  Result Value Ref Range   Lactic Acid, Venous 1.1 0.5 - 1.9 mmol/L    Comment: Performed at [facility], [address], [place], [place] [address]  Troponin I (High Sensitivity)     Status: Abnormal   Collection Time: 06/18/20 11:56 PM  Result Value Ref Range   Troponin I (High Sensitivity) 23 (H) <18 ng/L    Comment: (NOTE) Elevated high sensitivity troponin I (hsTnI) values and significant  changes across serial measurements may suggest ACS but many other  chronic and acute conditions are known to elevate hsTnI results.  Refer to the "Links" section for chest pain algorithms and additional  guidance. Performed at [facility], [address] [place], [place] [address]   Strep pneumoniae urinary antigen     Status: None   Collection Time: 06/18/20 11:56 PM  Result Value Ref Range   Strep Pneumo Urinary Antigen NEGATIVE NEGATIVE    Comment:        Infection due to S. pneumoniae cannot be absolutely ruled out since the antigen present may be below the detection limit of the test. Performed at [facility], [address], [place], [place] [address]   HIV Antibody (routine testing w rflx)     Status: None   Collection Time: 06/19/20  5:06 AM  Result Value Ref Range   HIV Screen 4th Generation wRfx Non Reactive Non Reactive    Comment: Performed at [facility], [place] [address] [place], [place] [address]  CBC     Status: Abnormal  Collection Time: 06/19/20  5:06 AM  Result Value Ref Range   WBC  20.3 (H) 4.0 - 10.5 K/uL   RBC 4.69 4.22 - 5.81 MIL/uL   Hemoglobin 13.5 13.0 - 17.0 g/dL   HCT 40.9 39 - 52 %   MCV 87.2 80.0 - 100.0 fL   MCH 28.8 26.0 - 34.0 pg   MCHC 33.0 30.0 - 36.0 g/dL   RDW 13.6 11.5 - 15.5 %   Platelets 488 (H) 150 - 400 K/uL   nRBC 0.0 0.0 - 0.2 %    Comment: Performed at [facility], [address], [place], [place] [address]  Basic metabolic panel     Status: Abnormal   Collection Time: 06/19/20  5:06 AM  Result Value Ref Range   Sodium 135 135 - 145 mmol/L   Potassium 3.9 3.5 - 5.1 mmol/L   Chloride 91 (L) 98 - 111 mmol/L   CO2 31 22 - 32 mmol/L   Glucose, Bld 156 (H) 70 - 99 mg/dL    Comment: Glucose reference range applies only to samples taken after fasting for at least 8 hours.   BUN 20 8 - 23 mg/dL   Creatinine, Ser 0.72 0.61 - 1.24 mg/dL   Calcium 8.4 (L) 8.9 - 10.3 mg/dL   GFR calc non Af Amer >60 >60 mL/min   GFR calc Af Amer >60 >60 mL/min   Anion gap 13 5 - 15    Comment: Performed at [facility], [address] [place], [place] [address]  TSH     Status: None   Collection Time: 06/19/20  5:06 AM  Result Value Ref Range   TSH 0.459 0.350 - 4.500 uIU/mL    Comment: Performed by a 3rd Generation assay with a functional sensitivity of <=0.01 uIU/mL. Performed at [facility], [address] [place], [place] [address]   Culture, sputum-assessment     Status: None   Collection Time: 06/19/20  7:28 AM   Specimen: Sputum  Result Value Ref Range   Specimen Description SPUTUM    Special Requests NONE    Sputum evaluation      THIS SPECIMEN IS ACCEPTABLE FOR SPUTUM CULTURE Performed at [facility], [address] [place], [place] [address]    Report Status 06/19/2020 FINAL   Culture, respiratory     Status: None (Preliminary result)   Collection Time: 06/19/20  7:28 AM   Specimen: SPU  Result Value Ref Range   Specimen Description      SPUTUM Performed at [facility], [address] [place], [place] [address]    Special Requests      NONE Reflexed from [id number] Performed at [facility], [place], [place] [address]    Gram Stain      MODERATE WBC PRESENT,BOTH PMN AND MONONUCLEAR MODERATE GRAM POSITIVE COCCI FEW GRAM VARIABLE ROD RARE YEAST Performed at [facility], [place] [address] [place], [place] [address]    Culture PENDING    Report Status PENDING    CT Chest W Contrast  Result Date: 06/18/2020 CLINICAL DATA:  69 year old male with shortness of breath, productive cough, pneumonia. On home oxygen. EXAM: CT CHEST WITH CONTRAST TECHNIQUE: Multidetector CT imaging of the chest was performed during intravenous contrast administration. CONTRAST:  57mL OMNIPAQUE IOHEXOL 300 MG/ML  SOLN COMPARISON:  Portable chest earlier today.  Chest CT 01/17/2020. FINDINGS: Cardiovascular: No cardiomegaly or pericardial effusion. Calcified coronary artery and aortic atherosclerosis.  The central vascular structures of the mediastinum are enhancing and appear to be patent. Mediastinum/Nodes: Mildly increased in reactive appearing mediastinal lymph nodes compared to 2020, up to 14 mm short axis. Some nodes (right paratracheal on series 2, image 47) have not significantly changed. Additionally, there is evidence of a larger 15 mm short axis left AP window node. Lungs/Pleura: Chronic pulmonary hyperinflation. Extensive chronic emphysema. Previous bullous emphysema in the left upper lung where now there is extensive consolidation with numerous small rounded areas of opacity suspicious for cavitation. There is a prominent fluid level lateral to the left hilum on series 3, image 65 suspicious for lung abscess. Abnormal interstitial thickening and peribronchial nodularity throughout the left lung. No superimposed pneumothorax. Only trace left pleural fluid. The major airways remain patent. Right lung emphysema and markings appear stable since last year. Upper Abdomen:  Negative visible liver, gallbladder, spleen, pancreas, adrenal glands, kidneys, and bowel in the upper abdomen. Musculoskeletal: Chronic compression fractures and ankylosis in the spine, with several levels of previous vertebral body augmentation, appears stable since last year. Occasional chronic rib fractures. No acute osseous abnormality identified. No chest wall abnormality. IMPRESSION: 1. 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 Consider necrotizing pneumonia as multiple areas are suspicious for cavitation, and especially a fluid-level lateral to the left hilum (series 2, image 68) strongly suggesting Lung Abscess. 2. Right lung unaffected.  Only trace left pleural fluid. 3. Reactive appearing mediastinal lymph nodes. 4. Calcified coronary artery and Aortic Atherosclerosis (ICD10-I70.0). Emphysema (ICD10-J43.9). Electronically Signed   By: Genevie Ann M.D.   On: 06/18/2020 22:14   DG Chest Portable 1 View  Result Date: 06/18/2020 CLINICAL DATA:  Shortness of breath all summer lung, worsening last week, productive cough, congestion EXAM: PORTABLE CHEST 1 VIEW COMPARISON:  CT 01/17/2019, radiograph 01/17/2019 FINDINGS: There is a background chronic reticular and fibrotic interstitial changes throughout both lungs albeit with new area of superimposed consolidative opacity spanning the left mid lung to apex including an area of potential cavitation seen more peripherally. No discernible pneumothorax or visible effusion. Cardiomediastinal contours are partially obscured by overlying opacity with the visible borders similar in appearance to the comparison studies from prior. No acute osseous or soft tissue abnormality. Degenerative changes are present in the imaged spine and shoulders. Telemetry leads overlie the chest. IMPRESSION: Consolidative opacity in the left mid lung to apex with some questionable cavitation peripherally. Recommend further characterization with CT of the chest with  contrast if patient is able to tolerate. Findings superimposed on chronic reticular and fibrotic interstitial changes. These results were called by telephone at the time of interpretation on 06/18/2020 at 8:45 pm to provider [facility] , who verbally acknowledged these results. Electronically Signed   By: Lovena Le M.D.   On: 06/18/2020 20:45    Blood pressure (!) 160/87, pulse (!) 117, temperature 98.4 F (36.9 C), temperature source Oral, resp. rate 19, height 6' (1.829 m), weight 81.6 kg, SpO2 90 %.   Assessment 1. Peripheral vascular disease with microvascular disease vs. Cellulitis both feet 2. Onychomycosis 3. Altered mental status 4. Admitted for COPD/necrotizing lung abscess  Plan -Patient seen and evaluated today. -Believe that redness to both feet is likely related to microvascular disease.  Rubor with dependency that improves with elevation.  Redness is blanchable indicating it is a component of peripheral vascular disease.  Patient appears to have open vessels to the big arteries to the feet as both the dorsalis pedis and posterior tibial pulses  are palpable and capillary fill time is intact to digits.  Feet also appear to be warm to touch. -If further issues arise would recommend further consultation with vascular surgery but do not think that they would perform any intervention at this time as patient appears to have large vessel runoff to the feet. -Nails x10 debrided with sterile nail nipper without incident.  Patient tolerated procedure well. -Recommend nursing staff bathe patient and clean feet with normal sterile saline.  Patient may also use moisturizers on feet daily. -Appreciate antibiotic recommendations per infectious disease but do not believe patient has cellulitis right now as it appears to be more related to microvascular peripheral vascular disease.  Podiatry team to sign off at this time.  Reconsult if any further problems arise.  Caroline More 06/19/2020,  3:46 PM

## 2020-06-19 NOTE — Consult Note (Addendum)
Consultation Note Date: 06/19/2020   [name] Name: Mark [name]  DOB: 1951-04-07  MRN: [id number]  Age / Sex: 69 y.o., male  PCP: Colford, Delcie Roch, MD Referring Physician: Max Sane, MD  Reason for Consultation: Establishing goals of care  HPI/[name] Profile: Mark [name] is a 69 y.o. male with medical history significant for severe COPD and chronic respiratory failure with hypoxia on 4 L supplemental O2 via [place] chronically, hypothyroidism, and anxiety on chronic benzodiazepines who presents to [name] ED for evaluation of progressive shortness of breath.  Clinical Assessment and Goals of Care: [name] resting in bed. Pulm MD present at bedside. He discusses Mark [name]'s imaging and poor prognosis as able. [name] states he has a sister and brother. He states he is not married and has no children. He tells me he has no living parents.  Discussed his conversation with pulm MD, and his concern for pain and suffering, but wanting more time. Attempted to discuss his status and initiate [place] conversation. He begins to ask questions, and frequently continues speaking without interuption about other topics not involved with [name] question rarely pausing for answers, and if allowing time for answers, not allowing time for full explanations before continuing to speak. Subject changed every few sentences to: Mark [name],and a prey animal knowing before it will die, camping, burial at sky (ashes being put into fireworks), and life insurance policies. He does discuss declining, and changes subject. Unable to have [place] conversation.   Spoke with sister listed as emergency contact. Sister states there are multiple siblings and no HPOA. She tells me he is "very hyper and difficult to talk to" at baseline, "and worse when he is on steroids". She states [name] family believes he has psychological issues at baseline  and is not good at making decisions for himself and does not need to live alone. She will speak with her siblings about [place] including code status. She understands he is a candidate for hospice facility if he chooses.      SUMMARY OF RECOMMENDATIONS   Sister believes [name] has undiagnosed psychiatric illness and is a poor decision maker a baseline. Could obtain a psych consult. Sister would like [name] to come to a place to make his own decisions, but will talk with her siblings about care moving forward if he is unable to make decisions himself. She feels family would want DNR/DNI status and will discuss comfort focused care over [name] weekend. He is a candidate for hospice facility if he chooses.   Will follow up Monday    Prognosis:   Very poor.       Primary Diagnoses: Present on Admission: . Necrotizing pneumonia ([place]) . Abscess of left lung with pneumonia ([place]) . 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 Hypokalemia . Hypothyroidism   I have reviewed Mark [name] medical record, interviewed Mark [name] and family, and examined Mark [name]. Mark [name] following aspects are pertinent.  Past Medical History:  Diagnosis Date  . Acute respiratory failure with hypoxia and  hypercapnia ([place]) 05/01/2016  . Asthma   . COPD (chronic obstructive pulmonary disease) ([place])   . Osteoporosis    Social History   Socioeconomic History  . Marital status: Single    Spouse name: Not on file  . Number of children: Not on file  . Years of education: Not on file  . Highest education level: Not on file  Occupational History  . Not on file  Tobacco Use  . Smoking status: Former Research scientist (life sciences)  . Smokeless tobacco: Never Used  Substance and Sexual Activity  . Alcohol use: No  . Drug use: No  . Sexual activity: Not on file  Other Topics Concern  . Not on file  Social History Narrative  . Not on file   Social Determinants of Health   Financial Resource Strain:   .  Difficulty of Paying Living Expenses: Not on file  Food Insecurity:   . Worried About Charity fundraiser in [name]: Not on file  . Ran Out of Food in [name]: Not on file  Transportation Needs:   . Lack of Transportation (Medical): Not on file  . Lack of Transportation (Non-Medical): Not on file  Physical Activity:   . Days of Exercise per Week: Not on file  . Minutes of Exercise per Session: Not on file  Stress:   . Feeling of Stress : Not on file  Social Connections:   . Frequency of Communication with Friends and Family: Not on file  . Frequency of Social Gatherings with Friends and Family: Not on file  . Attends Religious Services: Not on file  . Active Member of Clubs or Organizations: Not on file  . Attends Archivist Meetings: Not on file  . Marital Status: Not on file   Family History  Problem Relation Age of Onset  . Pulmonary fibrosis Mother    Scheduled Meds: . clonazePAM  1 mg Oral BID  . enoxaparin (LOVENOX) injection  40 mg Subcutaneous Q24H  . fluticasone furoate-vilanterol  1 puff Inhalation Daily   And  . umeclidinium bromide  1 puff Inhalation Daily  . guaiFENesin  1,200 mg Oral BID  . levothyroxine  25 mcg Oral QAC breakfast   Continuous Infusions: . ampicillin-sulbactam (UNASYN) IV Stopped (06/19/20 1230)   PRN Meds:.albuterol, chlorpheniramine-HYDROcodone Medications Prior to Admission:  Prior to Admission medications   Medication Sig Start Date End Date Taking? Authorizing Provider  albuterol (PROVENTIL HFA;VENTOLIN HFA) 108 (90 Base) MCG/ACT inhaler Inhale 2 puffs into [name] lungs every 4 (four) hours as needed for wheezing or shortness of breath.    Yes [provider]  aspirin 81 MG tablet Take 81 mg by mouth daily.   Yes [provider]  budesonide-formoterol (SYMBICORT) 160-4.5 MCG/ACT inhaler Inhale 2 puffs into [name] lungs 2 (two) times daily.   Yes [provider]  clonazePAM (KLONOPIN) 1 MG tablet  Take 1 mg by mouth 2 (two) times daily.   Yes [provider]  cyclobenzaprine (FLEXERIL) 10 MG tablet Take 10 mg by mouth 3 (three) times daily as needed for muscle spasms.   Yes [provider]  diphenhydrAMINE (BENADRYL) 25 mg capsule Take 25-50 mg by mouth every 6 (six) hours as needed for allergies.    Yes [provider]  EPINEPHrine (PRIMATENE MIST) 0.125 MG/ACT AERO Inhale 1 Dose into [name] lungs as needed (allergies).   Yes [provider]  fluticasone (FLONASE) 50 MCG/ACT nasal spray Place 2 sprays into both nostrils  daily as needed for allergies.  05/29/20  Yes [provider]  ipratropium (ATROVENT HFA) 17 MCG/ACT inhaler Inhale 2 puffs into [name] lungs every 6 (six) hours.   Yes [provider]  levothyroxine (SYNTHROID, LEVOTHROID) 25 MCG tablet Take 25 mcg by mouth daily before breakfast.   Yes [provider]  tiotropium (SPIRIVA) 18 MCG inhalation capsule Place 18 mcg into inhaler and inhale daily.   Yes [provider]  TRELEGY ELLIPTA 100-62.5-25 MCG/INH AEPB Inhale 1 puff into [name] lungs daily. 12/03/18  Yes [provider]   No Known Allergies Review of Systems  Respiratory: Positive for shortness of breath.     Physical Exam Pulmonary:     Effort: Pulmonary effort is normal.  Skin:    General: Skin is warm and dry.  Neurological:     Mental Status: He is alert.  Psychiatric:     Comments: Restless. Rapid speech.      Vital Signs: BP (!) 160/87   Pulse (!) 117   Temp 98.4 F (36.9 C) (Oral)   Resp 19   Ht 6' (1.829 m)   Wt 81.6 kg   SpO2 90%   BMI 24.41 kg/m  Pain Scale: 0-10   Pain Score: 7    SpO2: SpO2: 90 % O2 Device:SpO2: 90 % O2 Flow Rate: .O2 Flow Rate (L/min): 5 L/min  IO: Intake/output summary:   Intake/Output Summary (Last 24 hours) at 06/19/2020 1525 Last data filed at 06/19/2020 0521 Gross per 24 hour  Intake 1387.72 ml  Output 650 ml  Net 737.72 ml    LBM:     Baseline Weight: Weight: 81.6 kg Most recent weight: Weight: 81.6 kg     Palliative Assessment/Data:     Time In: 3:15 Time Out: 4:25 Time Total: 70 min Greater than 50%  of this time was spent counseling and coordinating care related to [name] above assessment and plan.  Signed by: Asencion Gowda, NP   Please contact Palliative Medicine Team phone at [phone] for questions and concerns.  For individual provider: See Shea Evans

## 2020-06-19 NOTE — ED Notes (Addendum)
This RN at bedside. Pt stating, "Why is the monitor still beeping." This RN explained to pt that since he pulled off his leads they are picking up artifact. Pt stating, well turn that off, you won't know if I'm dying anyway." This Rn explained to pt that the monitors we use will indicate if the pt is decompensating. Monitor turned off at this time per pt request. Pt stating he will monitor his oxygen with his personal pulse ox.

## 2020-06-19 NOTE — ED Notes (Signed)
On arrival into room at [facility], pt has removed oxygen is talking onp hone. Informed pt that he does need to leave pox in place for out monitoring and please leave oxygen in place. Pt informed that this rn can make monitor "not beep" in room, but leave in place at RN station for monitoring. Pt complies. Pt declines to have RN clean room. Lots of trash noted on bedside table including old meal tray that pt declines to have removed. Call bell at left side.

## 2020-06-19 NOTE — Plan of Care (Signed)
New care plan initiated 

## 2020-06-19 NOTE — Consult Note (Signed)
[facility] Nurse Consult Note: Reason for Consult:Consult for bilateral feet wounds.  MD note indicates no open wounds.  I call to speak with bedside RN who states that feet are red with chronic skin changes but no open wounds  Podiatry has been consulted and no immediate need, therefore I will defer to podiatry, whose orders will supercede that of the [facility] team.  Wound type:Cnronic venous insufficiency and self neglect at home.  Pressure Injury POA: NA  Defer to podiatry for plan of care to feet.  Will not follow at this time.  Please re-consult if needed.  [name] MSN, RN, FNP-BC CWON Wound, Ostomy, Continence Nurse Pager [phone]

## 2020-06-19 NOTE — Consult Note (Signed)
NAME: Mark [name]  DOB: 09-11-1951  MRN: [id number]  Date/Time: 06/19/2020 10:19 AM  REQUESTING PROVIDER Subjective:  REASON FOR CONSULT:  ? Mark [name] is a 69 y.o. with a history of COPD, on home oxygen, hypothyroidism, anxiety on benzos admitted with chronic cough and foul smelling sputum of many weeks duration but worsening in the past 2 weeks. HE also has generalized weakness PT says he has COPD and has had lung abscess/pneumonia on the rt side in 2009 which was treated with antibiotics.  Ex smoker Says he used to binge drink alcohol but not anymore He lives on his own  HE denies fever denies MRSA infection in the past Vitals in the ED  BP 153/90, pulse 120, RR 22, temp 98.5 Fahrenheit, SPO2 99% initially on nonrebreather.  He was weaned down to his home 4 L supplemental O2 via [place] maintaining O2 saturations >88%.  Labs revealed WBC of 22.9, HB 14. PLT 474000, Na 134, K 3.4, BUN 20, cr 0.69 Sars cov2 was negative CXR showed left midlung opacity with cavitation CT chest showed chronic emphysema with left multilobar pneumonia and necrotizing pneumonia. Started on vanco and cefepime and then changed to unasyn I am asked to see the patient for the same   Past Medical History:  Diagnosis Date  . Acute respiratory failure with hypoxia and hypercapnia ([place]) 05/01/2016  . Asthma   . COPD (chronic obstructive pulmonary disease) ([place])   . Osteoporosis     Past Surgical History:  Procedure Laterality Date  . BACK SURGERY      SH Lives on his own Ex Smoker  ex alcohol abuse Family History  Problem Relation Age of Onset  . Pulmonary fibrosis Mother    No Known Allergies  ? Current Facility-Administered Medications  Medication Dose Route Frequency Provider Last Rate Last Admin  . albuterol (PROVENTIL) (2.5 MG/3ML) 0.083% nebulizer solution 3 mL  3 mL Inhalation Q4H PRN Zada Finders R, MD      . Ampicillin-Sulbactam (UNASYN) 3 g in sodium chloride 0.9 % 100 mL IVPB  3 g  Intravenous Q6H Lenore Cordia, MD   Stopped at 06/19/20 0727  . chlorpheniramine-HYDROcodone (TUSSIONEX) 10-8 MG/5ML suspension 5 mL  5 mL Oral Q12H PRN Lenore Cordia, MD   5 mL at 06/19/20 0049  . clonazePAM (KLONOPIN) tablet 1 mg  1 mg Oral BID Lenore Cordia, MD   1 mg at 06/19/20 2703  . enoxaparin (LOVENOX) injection 40 mg  40 mg Subcutaneous Q24H Patel, Vishal R, MD      . fluticasone furoate-vilanterol (BREO ELLIPTA) 100-25 MCG/INH 1 puff  1 puff Inhalation Daily Lu Duffel, RPH   1 puff at 06/19/20 0936   And  . umeclidinium bromide (INCRUSE ELLIPTA) 62.5 MCG/INH 1 puff  1 puff Inhalation Daily Lu Duffel, RPH   1 puff at 06/19/20 5009  . guaiFENesin (MUCINEX) 12 hr tablet 1,200 mg  1,200 mg Oral BID Zada Finders R, MD   600 mg at 06/19/20 0939  . levothyroxine (SYNTHROID) tablet 25 mcg  25 mcg Oral QAC breakfast Lenore Cordia, MD   25 mcg at 06/19/20 0518   Current Outpatient Medications  Medication Sig Dispense Refill  . albuterol (PROVENTIL HFA;VENTOLIN HFA) 108 (90 Base) MCG/ACT inhaler Inhale 2 puffs into the lungs every 4 (four) hours as needed for wheezing or shortness of breath.     Marland Kitchen aspirin 81 MG tablet Take 81 mg by mouth daily.    . budesonide-formoterol (  SYMBICORT) 160-4.5 MCG/ACT inhaler Inhale 2 puffs into the lungs 2 (two) times daily.    . clonazePAM (KLONOPIN) 1 MG tablet Take 1 mg by mouth 2 (two) times daily.    . cyclobenzaprine (FLEXERIL) 10 MG tablet Take 10 mg by mouth 3 (three) times daily as needed for muscle spasms.    . diphenhydrAMINE (BENADRYL) 25 mg capsule Take 25-50 mg by mouth every 6 (six) hours as needed for allergies.     Marland Kitchen EPINEPHrine (PRIMATENE MIST) 0.125 MG/ACT AERO Inhale 1 Dose into the lungs as needed (allergies).    . fluticasone (FLONASE) 50 MCG/ACT nasal spray Place 2 sprays into both nostrils daily as needed for allergies.     Marland Kitchen ipratropium (ATROVENT HFA) 17 MCG/ACT inhaler Inhale 2 puffs into the lungs every 6  (six) hours.    Marland Kitchen levothyroxine (SYNTHROID, LEVOTHROID) 25 MCG tablet Take 25 mcg by mouth daily before breakfast.    . tiotropium (SPIRIVA) 18 MCG inhalation capsule Place 18 mcg into inhaler and inhale daily.    . TRELEGY ELLIPTA 100-62.5-25 MCG/INH AEPB Inhale 1 puff into the lungs daily.       Abtx:  Anti-infectives (From admission, onward)   Start     Dose/Rate Route Frequency Ordered Stop   06/19/20 0000  Ampicillin-Sulbactam (UNASYN) 3 g in sodium chloride 0.9 % 100 mL IVPB        3 g 200 mL/hr over 30 Minutes Intravenous Every 6 hours 06/18/20 2327     06/18/20 2100  ceFEPIme (MAXIPIME) 1 g in sodium chloride 0.9 % 100 mL IVPB        1 g 200 mL/hr over 30 Minutes Intravenous  Once 06/18/20 2049 06/18/20 2137   06/18/20 2100  vancomycin (VANCOCIN) IVPB 1000 mg/200 mL premix        1,000 mg 200 mL/hr over 60 Minutes Intravenous  Once 06/18/20 2049 06/18/20 2302      REVIEW OF SYSTEMS:  Const: negative fever, negative chills, negative weight loss Eyes: negative diplopia or visual changes, negative eye pain ENT: negative coryza, negative sore throat Resp:  cough, dyspnea, purulent sputum Cards: negative for chest pain, palpitations, lower extremity edema GU:  frequency, nocturia GI: Negative for abdominal pain, diarrhea, bleeding, constipation Skin: skin feet red Heme: negative for easy bruising and gum/nose bleeding ER:[id number] weakness Neurolo:negative for headaches, dizziness, vertigo, memory problems  Psych:  anxiety,  Endocrine: negative for thyroid, diabetes Allergy/Immunology- negative for any medication or food allergies ?  Objective:  VITALS:  BP (!) 154/106   Pulse (!) 114   Temp 98.4 F (36.9 C) (Oral)   Resp 19   Ht 6' (1.829 m)   Wt 81.6 kg   SpO2 90%   BMI 24.41 kg/m  PHYSICAL EXAM:  General: Alert, cooperative, no distress, unkempt, talkative- tangentially off at times  Head: Normocephalic, without obvious abnormality, atraumatic. Eyes:  Conjunctivae clear, anicteric sclerae. Pupils are equal ENT Nares normal. No drainage or sinus tenderness. Bad dentition- only 3 left  Neck: Supple,  Thick beard. Lungs: b/l air entry- crepts rhonchi left Heart: Regular rate and rhythm, no murmur, rub or gallop. Abdomen: Soft, non-tender,not distended. Bowel sounds normal. No masses Extremities: erythema feet left > rt, No warmth or tenderness    Dystrophic toe nails  Skin- limited examination currently Lymph: Cervical, supraclavicular normal. Neurologic: Grossly non-focal Pertinent Labs Lab Results CBC    Component Value Date/Time   WBC 20.3 (H) 06/19/2020 0506   RBC 4.69 06/19/2020 0506  HGB 13.5 06/19/2020 0506   HCT 40.9 06/19/2020 0506   PLT 488 (H) 06/19/2020 0506   MCV 87.2 06/19/2020 0506   MCH 28.8 06/19/2020 0506   MCHC 33.0 06/19/2020 0506   RDW 13.6 06/19/2020 0506   LYMPHSABS 0.5 (L) 05/01/2016 0151   MONOABS 1.1 (H) 05/01/2016 0151   EOSABS 0.0 05/01/2016 0151   BASOSABS 0.0 05/01/2016 0151    CMP Latest Ref Rng & Units 06/19/2020 06/18/2020 01/18/2019  Glucose 70 - 99 mg/dL 156(H) 121(H) 93  BUN 8 - 23 mg/dL 20 20 15   Creatinine 0.61 - 1.24 mg/dL 0.72 0.69 0.86  Sodium 135 - 145 mmol/L 135 134(L) 139  Potassium 3.5 - 5.1 mmol/L 3.9 3.4(L) 3.5  Chloride 98 - 111 mmol/L 91(L) 90(L) 103  CO2 22 - 32 mmol/L 31 28 29   Calcium 8.9 - 10.3 mg/dL 8.4(L) 8.3(L) 8.8(L)  Total Protein 6.5 - 8.1 g/dL - - -  Total Bilirubin 0.3 - 1.2 mg/dL - - -  Alkaline Phos 38 - 126 U/L - - -  AST 15 - 41 U/L - - -  ALT 0 - 44 U/L - - -      Microbiology: Recent Results (from the past 240 hour(s))  SARS Coronavirus 2 by RT PCR (hospital order, performed in [facility] hospital lab) Nasopharyngeal Nasopharyngeal Swab     Status: None   Collection Time: 06/18/20  8:53 PM   Specimen: Nasopharyngeal Swab  Result Value Ref Range Status   SARS Coronavirus 2 NEGATIVE NEGATIVE Final    Comment: (NOTE) SARS-CoV-2 target  nucleic acids are NOT DETECTED.  The SARS-CoV-2 RNA is generally detectable in upper and lower respiratory specimens during the acute phase of infection. The lowest concentration of SARS-CoV-2 viral copies this assay can detect is 250 copies / mL. A negative result does not preclude SARS-CoV-2 infection and should not be used as the sole basis for treatment or other patient management decisions.  A negative result may occur with improper specimen collection / handling, submission of specimen other than nasopharyngeal swab, presence of viral mutation(s) within the areas targeted by this assay, and inadequate number of viral copies (<250 copies / mL). A negative result must be combined with clinical observations, patient history, and epidemiological information.  Fact Sheet for Patients:   StrictlyIdeas.no  Fact Sheet for Healthcare Providers: BankingDealers.co.za  This test is not yet approved or  cleared by the [place] FDA and has been authorized for detection and/or diagnosis of SARS-CoV-2 by FDA under an Emergency Use Authorization (EUA).  This EUA will remain in effect (meaning this test can be used) for the duration of the COVID-19 declaration under Section 564(b)(1) of the Act, 21 U.S.C. section 360bbb-3(b)(1), unless the authorization is terminated or revoked sooner.  Performed at [facility], [address] [place], [place] [address]   Culture, blood (routine x 2)     Status: None (Preliminary result)   Collection Time: 06/18/20  8:54 PM   Specimen: BLOOD  Result Value Ref Range Status   Specimen Description BLOOD RIGHT FA  Final   Special Requests   Final    BOTTLES DRAWN AEROBIC AND ANAEROBIC Blood Culture adequate volume   Culture   Final    NO GROWTH < 12 HOURS Performed at [facility], [address] [place], [place] [address]    Report Status PENDING  Incomplete  Culture, blood (routine  x 2)     Status: None (Preliminary result)  Collection Time: 06/18/20  8:54 PM   Specimen: BLOOD  Result Value Ref Range Status   Specimen Description BLOOD LEFT FA  Final   Special Requests   Final    BOTTLES DRAWN AEROBIC AND ANAEROBIC Blood Culture adequate volume   Culture   Final    NO GROWTH < 12 HOURS Performed at [facility], [address] [place], [place] [address]    Report Status PENDING  Incomplete  Culture, sputum-assessment     Status: None   Collection Time: 06/19/20  7:28 AM   Specimen: Sputum  Result Value Ref Range Status   Specimen Description SPUTUM  Final   Special Requests NONE  Final   Sputum evaluation   Final    THIS SPECIMEN IS ACCEPTABLE FOR SPUTUM CULTURE Performed at [facility], [address], [place], [place] [address]    Report Status 06/19/2020 FINAL  Final    IMAGING RESULTS:   I have personally reviewed the films ? Impression/Recommendation ? Left lung necrotizing pneumonia with cavitation chronic- mid lung Very likely aspiration. Till sputum culture ,MRSA nares swab are available will give vanco, ceftriaxone and flagyl instead of unasyn  COPD - with bad emphysema Erythema left foot- is not typical of cellulitis- but the above antibiotics will cover  Unkempt  ?Obesity  H/o alcohol abuse- says not in the past 2o yrs  ? ___________________________________________________ Discussed with patient, and care team ID will follow him peripherally this weekend  Note:  This document was prepared using Dragon voice recognition software and may include unintentional dictation errors.

## 2020-06-19 NOTE — Progress Notes (Addendum)
PROGRESS NOTE    Mark [name]  YQM:[id number] DOB: 02/20/1951 DOA: 06/18/2020 PCP: Mark Cockayne, MD at [facility] internal medicine His last office visit in care everywhere is showing 11/02/2017 Last refill of medications/Klonopin 1 mg p.o. twice daily for 60 tablets was on 05/29/2020 by PCP office  Brief Narrative: As dictated by admitting physician.  HPI: Mark [name] is a 69 y.o. male with medical history significant for severe COPD and chronic respiratory failure with hypoxia on 4 L supplemental O2 via [place] chronically, hypothyroidism, and anxiety on chronic benzodiazepines who presents to the ED for evaluation of progressive shortness of breath.  Patient states he has been feeling unwell all summer long.  He says he has been intolerant to the heat and pre much confined to his apartment.  He has been getting progressively worsening shortness of breath and dyspnea on exertion.  At baseline he has a chronic cough usually productive of clear sputum however the last 2 weeks he has had purulent yellow malodorous sputum production.  He reports subjective fevers, chills, diaphoresis.  He is a former smoker and states he quit at the beginning of the COVID-19 pandemic.  He reports a history of right-sided lung abscess in 2009.  He says that he has become progressively weak and has difficulty getting out of bed recently to even go to the bathroom.  ED Course:  Initial vitals showed BP 153/90, pulse 120, RR 22, temp 98.5 Fahrenheit, SPO2 99% initially on nonrebreather.  He was weaned down to his home 4 L supplemental O2 via [place] maintaining O2 saturations >88%.  Labs showed WBC 22.9, hemoglobin 14.0, platelets 474,000, sodium 134, potassium 3.4, bicarb 28, BUN 20, creatinine 0.69, serum glucose 121, BNP 238.2, high-sensitivity troponin I 26.  VBG showed pH 7.4, PCO2 57, PO2 119.  Lactic acid 1.1.  SARS-CoV-2 PCR is negative.  Blood cultures pending.  Portable chest x-ray showed consolidative  opacity in the left midlung with cavitary changes.  CT chest with contrast was obtained and showed chronic severe emphysema with superimposed left multilobar pneumonia and findings suggestive of necrotizing pneumonia with multiple areas suspicious for cavitation and suspected lung abscess lateral to the left hilum.  Patient was given IV vancomycin and cefepime, IV Solu-Medrol 125 mg, 1 L LR, and DuoNeb treatment.     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  Seems chronic based on patient reported history Continue IV Unasyn for now We will obtain pulmonary and ID consultation Strep pneumo urinary antigen is negative Await Legionella, sputum and blood culture  Severe COPD and chronic hypoxic respiratory failure requiring 4 L oxygen via nasal cannula chronically Seems at baseline, continue supplemental oxygen for now  Anxiety on chronic benzodiazepine Continue Klonopin 1 mg p.o. twice daily, this was last refilled on 05/29/2020 by PCP office Will c/s Psych (message sent to Mark [name] who won't be able to see him in time and will pass on to his peer).  Hypokalemia - POA Repleted and resolved  Onychomycosis & PVD in LEs Appreciate Podiatry input, s/p bedside nail debridement  Skin care per Podiatry and wound care nurse Can consider Vascular eval if need but doubt any intervention need  Goals of care - will c/s  palliative care. Overall very poor prognosis with poor baseline functional status    DVT prophylaxis: enoxaparin (LOVENOX) injection 40 mg Start: 06/19/20 1000    Code Status: Full Code Family Communication: Updated patient's Sister Mark [name] over phone at [phone] on  06/19/2020 Disposition Plan: Patient came from home, patient's sister feels he may be better off to SNF considering poor home situation.  Will await PT, OT eval.  I have consulted TOC team.  Per patient's sister patient has been living at his own home alone for long time.  He has been to SNF several times in the past.  At baseline patient is not able to drive.  Meals get delivered at home and he is able to eat by himself.  He does not walk much but when he does he needs a walker.  He sleeps a lot  Status is: Inpatient  Remains inpatient appropriate because:Unsafe d/c plan   Dispo: The patient is from: Home              Anticipated d/c is to: SNF versus home with home health depending on PT/OT evaluation              Anticipated d/c date is: 3 days              Patient currently is not medically stable to d/c.  Awaiting pulmonary, ID eval for lung abscess, necrotizing pneumonia.    Consultants:   Infectious disease  Pulmonary  Palliative care  TOC   Procedures: None   Antimicrobials: IV Unasyn   Subjective: Patient extremely verbose, requesting Covid vaccine specifically moderna.  Denies any other complaints.  Objective: Vitals:   06/19/20 0800 06/19/20 0830 06/19/20 0845 06/19/20 0900  BP: (!) 156/83 139/88  (!) 154/106  Pulse: (!) 114 (!) 112  (!) 114  Resp: 20 18  19   Temp:      TempSrc:      SpO2: (!) 89% (!) 88% 90% 90%  Weight:      Height:        Intake/Output Summary (Last 24 hours) at 06/19/2020 1021 Last data filed at 06/19/2020 0521 Gross per 24 hour  Intake 1387.72 ml  Output 650 ml  Net 737.72 ml   Filed Weights   06/18/20 2006  Weight: 81.6 kg    Examination:  General exam: Chronically disheveled man, resting comfortably Respiratory system: Rhonchi on the left side of lungs.  No rales or crepitation. Respiratory effort normal. Cardiovascular system: S1 & S2 heard, RRR. No JVD, murmurs, rubs, gallops or clicks. No pedal  edema. Gastrointestinal system: Abdomen is nondistended, soft and nontender. No organomegaly or masses felt. Normal bowel sounds heard. Central nervous system: Alert and oriented. No focal neurological deficits. Extremities: Symmetric 5 x 5 power. Skin: Stasis dermatitis & Poor circulation changes in both lower extremities.  No ulcers. Psychiatry: Judgement and insight appear normal. Mood & affect appropriate.  Tangential speech, extremely verbose.    Data Reviewed: I have personally reviewed following labs and imaging studies  CBC: Recent Labs  Lab 06/18/20 2014 06/19/20 0506  WBC 22.9* 20.3*  HGB 14.0 13.5  HCT 41.7 40.9  MCV 87.6 87.2  PLT 474* 379*   Basic Metabolic Panel: Recent Labs  Lab 06/18/20 2014 06/19/20 0506  NA 134* 135  K 3.4* 3.9  CL 90* 91*  CO2 28 31  GLUCOSE 121* 156*  BUN 20 20  CREATININE 0.69 0.72  CALCIUM 8.3* 8.4*   GFR: Estimated Creatinine Clearance: 95.7 mL/min (  by C-G formula based on SCr of 0.72 mg/dL). Liver Function Tests: No results for input(s): AST, ALT, ALKPHOS, BILITOT, PROT, ALBUMIN in the last 168 hours. No results for input(s): LIPASE, AMYLASE in the last 168 hours. No results for input(s): AMMONIA in the last 168 hours. Coagulation Profile: No results for input(s): INR, PROTIME in the last 168 hours. Cardiac Enzymes: No results for input(s): CKTOTAL, CKMB, CKMBINDEX, TROPONINI in the last 168 hours. BNP (last 3 results) No results for input(s): PROBNP in the last 8760 hours. HbA1C: No results for input(s): HGBA1C in the last 72 hours. CBG: No results for input(s): GLUCAP in the last 168 hours. Lipid Profile: No results for input(s): CHOL, HDL, LDLCALC, TRIG, CHOLHDL, LDLDIRECT in the last 72 hours. Thyroid Function Tests: Recent Labs    06/19/20 0506  TSH 0.459   Anemia Panel: No results for input(s): VITAMINB12, FOLATE, FERRITIN, TIBC, IRON, RETICCTPCT in the last 72 hours. Sepsis Labs: Recent Labs  Lab  06/18/20 2054  LATICACIDVEN 1.1    Recent Results (from the past 240 hour(s))  SARS Coronavirus 2 by RT PCR (hospital order, performed in [facility] hospital lab) Nasopharyngeal Nasopharyngeal Swab     Status: None   Collection Time: 06/18/20  8:53 PM   Specimen: Nasopharyngeal Swab  Result Value Ref Range Status   SARS Coronavirus 2 NEGATIVE NEGATIVE Final    Comment: (NOTE) SARS-CoV-2 target nucleic acids are NOT DETECTED.  The SARS-CoV-2 RNA is generally detectable in upper and lower respiratory specimens during the acute phase of infection. The lowest concentration of SARS-CoV-2 viral copies this assay can detect is 250 copies / mL. A negative result does not preclude SARS-CoV-2 infection and should not be used as the sole basis for treatment or other patient management decisions.  A negative result may occur with improper specimen collection / handling, submission of specimen other than nasopharyngeal swab, presence of viral mutation(s) within the areas targeted by this assay, and inadequate number of viral copies (<250 copies / mL). A negative result must be combined with clinical observations, patient history, and epidemiological information.  Fact Sheet for Patients:   StrictlyIdeas.no  Fact Sheet for Healthcare Providers: BankingDealers.co.za  This test is not yet approved or  cleared by the [place] FDA and has been authorized for detection and/or diagnosis of SARS-CoV-2 by FDA under an Emergency Use Authorization (EUA).  This EUA will remain in effect (meaning this test can be used) for the duration of the COVID-19 declaration under Section 564(b)(1) of the Act, 21 U.S.C. section 360bbb-3(b)(1), unless the authorization is terminated or revoked sooner.  Performed at [facility], [address] [place], [place] [address]   Culture, blood (routine x 2)     Status: None (Preliminary result)    Collection Time: 06/18/20  8:54 PM   Specimen: BLOOD  Result Value Ref Range Status   Specimen Description BLOOD RIGHT FA  Final   Special Requests   Final    BOTTLES DRAWN AEROBIC AND ANAEROBIC Blood Culture adequate volume   Culture   Final    NO GROWTH < 12 HOURS Performed at [facility], [address] [place],  [address]    Report Status PENDING  Incomplete  Culture, blood (routine x 2)     Status: None (Preliminary result)   Collection Time: 06/18/20  8:54 PM   Specimen: BLOOD  Result Value Ref Range Status   Specimen Description BLOOD LEFT FA  Final   Special Requests  Final    BOTTLES DRAWN AEROBIC AND ANAEROBIC Blood Culture adequate volume   Culture   Final    NO GROWTH < 12 HOURS Performed at [facility], [address] [place], [place] [address]    Report Status PENDING  Incomplete  Culture, sputum-assessment     Status: None   Collection Time: 06/19/20  7:28 AM   Specimen: Sputum  Result Value Ref Range Status   Specimen Description SPUTUM  Final   Special Requests NONE  Final   Sputum evaluation   Final    THIS SPECIMEN IS ACCEPTABLE FOR SPUTUM CULTURE Performed at [facility], [address], [place], [place] [address]    Report Status 06/19/2020 FINAL  Final         Radiology Studies: CT Chest W Contrast  Result Date: 06/18/2020 CLINICAL DATA:  69 year old male with shortness of breath, productive cough, pneumonia. On home oxygen. EXAM: CT CHEST WITH CONTRAST TECHNIQUE: Multidetector CT imaging of the chest was performed during intravenous contrast administration. CONTRAST:  46mL OMNIPAQUE IOHEXOL 300 MG/ML  SOLN COMPARISON:  Portable chest earlier today.  Chest CT 01/17/2020. FINDINGS: Cardiovascular: No cardiomegaly or pericardial effusion. Calcified coronary artery and aortic atherosclerosis. The central vascular structures of the mediastinum are enhancing and appear to be patent. Mediastinum/Nodes: Mildly  increased in reactive appearing mediastinal lymph nodes compared to 2020, up to 14 mm short axis. Some nodes (right paratracheal on series 2, image 47) have not significantly changed. Additionally, there is evidence of a larger 15 mm short axis left AP window node. Lungs/Pleura: Chronic pulmonary hyperinflation. Extensive chronic emphysema. Previous bullous emphysema in the left upper lung where now there is extensive consolidation with numerous small rounded areas of opacity suspicious for cavitation. There is a prominent fluid level lateral to the left hilum on series 3, image 65 suspicious for lung abscess. Abnormal interstitial thickening and peribronchial nodularity throughout the left lung. No superimposed pneumothorax. Only trace left pleural fluid. The major airways remain patent. Right lung emphysema and markings appear stable since last year. Upper Abdomen: Negative visible liver, gallbladder, spleen, pancreas, adrenal glands, kidneys, and bowel in the upper abdomen. Musculoskeletal: Chronic compression fractures and ankylosis in the spine, with several levels of previous vertebral body augmentation, appears stable since last year. Occasional chronic rib fractures. No acute osseous abnormality identified. No chest wall abnormality. IMPRESSION: 1. 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 Consider necrotizing pneumonia as multiple areas are suspicious for cavitation, and especially a fluid-level lateral to the left hilum (series 2, image 68) strongly suggesting Lung Abscess. 2. Right lung unaffected.  Only trace left pleural fluid. 3. Reactive appearing mediastinal lymph nodes. 4. Calcified coronary artery and Aortic Atherosclerosis (ICD10-I70.0). Emphysema (ICD10-J43.9). Electronically Signed   By: Genevie Ann M.D.   On: 06/18/2020 22:14   DG Chest Portable 1 View  Result Date: 06/18/2020 CLINICAL DATA:  Shortness of breath all summer lung, worsening last week, productive cough,  congestion EXAM: PORTABLE CHEST 1 VIEW COMPARISON:  CT 01/17/2019, radiograph 01/17/2019 FINDINGS: There is a background chronic reticular and fibrotic interstitial changes throughout both lungs albeit with new area of superimposed consolidative opacity spanning the left mid lung to apex including an area of potential cavitation seen more peripherally. No discernible pneumothorax or visible effusion. Cardiomediastinal contours are partially obscured by overlying opacity with the visible borders similar in appearance to the comparison studies from prior. No acute osseous or soft tissue abnormality. Degenerative changes are present in the  imaged spine and shoulders. Telemetry leads overlie the chest. IMPRESSION: Consolidative opacity in the left mid lung to apex with some questionable cavitation peripherally. Recommend further characterization with CT of the chest with contrast if patient is able to tolerate. Findings superimposed on chronic reticular and fibrotic interstitial changes. These results were called by telephone at the time of interpretation on 06/18/2020 at 8:45 pm to provider [facility] , who verbally acknowledged these results. Electronically Signed   By: Lovena Le M.D.   On: 06/18/2020 20:45        Scheduled Meds: . clonazePAM  1 mg Oral BID  . enoxaparin (LOVENOX) injection  40 mg Subcutaneous Q24H  . fluticasone furoate-vilanterol  1 puff Inhalation Daily   And  . umeclidinium bromide  1 puff Inhalation Daily  . guaiFENesin  1,200 mg Oral BID  . levothyroxine  25 mcg Oral QAC breakfast   Continuous Infusions: . ampicillin-sulbactam (UNASYN) IV Stopped (06/19/20 0727)     LOS: 1 day    Time spent: 35 minutes    Cherylanne Ardelean Manuella Ghazi, MD [facility] Pager [phone]  If 7PM-7AM, please contact night-coverage [email or web address] Password [facility] 06/19/2020, 10:21 AM

## 2020-06-20 LAB — PROCALCITONIN: Procalcitonin: 0.57 ng/mL

## 2020-06-20 LAB — BASIC METABOLIC PANEL
Anion gap: 11 (ref 5–15)
BUN: 32 mg/dL — ABNORMAL HIGH (ref 8–23)
CO2: 33 mmol/L — ABNORMAL HIGH (ref 22–32)
Calcium: 8.7 mg/dL — ABNORMAL LOW (ref 8.9–10.3)
Chloride: 92 mmol/L — ABNORMAL LOW (ref 98–111)
Creatinine, Ser: 0.83 mg/dL (ref 0.61–1.24)
GFR calc Af Amer: >60 mL/min (ref >60)
GFR calc non Af Amer: >60 mL/min (ref >60)
Glucose, Bld: 110 mg/dL — ABNORMAL HIGH (ref 70–99)
Potassium: 3.9 mmol/L (ref 3.5–5.1)
Sodium: 136 mmol/L (ref 135–145)

## 2020-06-20 LAB — CBC
HCT: 39.1 % (ref 39.0–52.0)
Hemoglobin: 13 g/dL (ref 13.0–17.0)
MCH: 29.8 pg (ref 26.0–34.0)
MCHC: 33.2 g/dL (ref 30.0–36.0)
MCV: 89.7 fL (ref 80.0–100.0)
Platelets: 511 10*3/uL — ABNORMAL HIGH (ref 150–400)
RBC: 4.36 MIL/uL (ref 4.22–5.81)
RDW: 14 % (ref 11.5–15.5)
WBC: 21.6 10*3/uL — ABNORMAL HIGH (ref 4.0–10.5)
nRBC: 0 % (ref 0.0–0.2)

## 2020-06-20 NOTE — Progress Notes (Signed)
Pulmonary Medicine          Date: 06/20/2020,   MRN# [id number] Mark [name] [birth year]     AdmissionWeight: 81.6 kg                 CurrentWeight: 81.6 kg   Referring physician: Dr. Manuella Ghazi   CHIEF COMPLAINT:   Necrotizing pneumonia   HISTORY OF PRESENT ILLNESS   Is a pleasant 69 year old male with a history of chronic pain syndrome with opioid-induced constipation, GERD, advanced COPD chronic hypoxemia on 4 L/min supplemental oxygen home O2.  Also has a background history of hypothyroidism, peripheral neuropathy chronic lumbago, reports worsening respiratory status x2 to 3 months with signs and symptoms of acute COPD exacerbation with increased volume and darkening of phlegm on expectoration.  Most recently in the last 2 weeks prior to admission he developed chills diaphoresis and subjective fevers.  In the ER he was found to have leukocytosis, negative COVID-19 test x2, increased O2 requirement from 4 to 5 L/min nasal cannula however venous blood gas was essentially normal BNP and troponin also within reference range.  CT chest shows necrotizing pneumonia with consolidative infiltrate of the left upper and lower lung zones as well as fluid level cystic lesion suggestive of abscess with sorrounding emphysema.  Pulmonary consultation placed for further evaluation and management of necrotizing pneumonia.  He has crusted swollen erythematous feet with intertrigo, he has distended abdomen, poor dentition, and reports broken back. He shares that he has no family and when asked regarding medical proxy he states "im on my own".  Overall he looks chronically ill.    06/20/20- patient had a good night sleep and now is more lucid, hes drinking coffee.  He is speaking rapidly with cicumferential and tangential language. WBC count is with mild trend up.   PAST MEDICAL HISTORY   Past Medical History:  Diagnosis Date  . Acute respiratory failure with hypoxia and hypercapnia ([place])  05/01/2016  . Asthma   . COPD (chronic obstructive pulmonary disease) ([place])   . Osteoporosis      SURGICAL HISTORY   Past Surgical History:  Procedure Laterality Date  . BACK SURGERY       FAMILY HISTORY   Family History  Problem Relation Age of Onset  . Pulmonary fibrosis Mother      SOCIAL HISTORY   Social History   Tobacco Use  . Smoking status: Former Research scientist (life sciences)  . Smokeless tobacco: Never Used  Substance Use Topics  . Alcohol use: No  . Drug use: No     MEDICATIONS    Home Medication:    Current Medication:  Current Facility-Administered Medications:  .  albuterol (PROVENTIL) (2.5 MG/3ML) 0.083% nebulizer solution 3 mL, 3 mL, Inhalation, Q4H PRN, Zada Finders R, MD, 3 mL at 06/19/20 1123 .  cefTRIAXone (ROCEPHIN) 2 g in sodium chloride 0.9 % 100 mL IVPB, 2 g, Intravenous, Q24H, Tsosie Billing, MD, Stopped at 06/19/20 1829 .  chlorpheniramine-HYDROcodone (TUSSIONEX) 10-8 MG/5ML suspension 5 mL, 5 mL, Oral, Q4H PRN, Max Sane, MD, 5 mL at 06/19/20 1630 .  clonazePAM (KLONOPIN) tablet 1 mg, 1 mg, Oral, BID, Zada Finders R, MD, 1 mg at 06/19/20 2038 .  enoxaparin (LOVENOX) injection 40 mg, 40 mg, Subcutaneous, Q24H, Patel, Vishal R, MD .  fluticasone furoate-vilanterol (BREO ELLIPTA) 100-25 MCG/INH 1 puff, 1 puff, Inhalation, Daily, 1 puff at 06/20/20 0824 **AND** umeclidinium bromide (INCRUSE ELLIPTA) 62.5 MCG/INH 1 puff, 1 puff, Inhalation, Daily, Shanlever, Pierce Crane, RPH,  1 puff at 06/20/20 0824 .  guaiFENesin (MUCINEX) 12 hr tablet 1,200 mg, 1,200 mg, Oral, BID, Zada Finders R, MD, 1,200 mg at 06/20/20 0834 .  ketorolac (TORADOL) 30 MG/ML injection 30 mg, 30 mg, Intravenous, Once, Sharion Settler, NP .  levothyroxine (SYNTHROID) tablet 25 mcg, 25 mcg, Oral, QAC breakfast, Lenore Cordia, MD, 25 mcg at 06/20/20 0429 .  metroNIDAZOLE (FLAGYL) IVPB 500 mg, 500 mg, Intravenous, Q8H, Ravishankar, Jayashree, MD, Last Rate: 100 mL/hr at 06/20/20 0425, 500  mg at 06/20/20 0425 .  vancomycin (VANCOCIN) IVPB 1000 mg/200 mL premix, 1,000 mg, Intravenous, Q8H, Benn Moulder, RPH, Last Rate: 200 mL/hr at 06/20/20 0824, 1,000 mg at 06/20/20 2035    ALLERGIES   Patient has no known allergies.     REVIEW OF SYSTEMS    Review of Systems:  Gen:  Denies  fever, sweats, chills weigh loss  HEENT: Denies blurred vision, double vision, ear pain, eye pain, hearing loss, nose bleeds, sore throat Cardiac:  No dizziness, chest pain or heaviness, chest tightness,edema Resp:   Denies cough or sputum porduction, shortness of breath,wheezing, hemoptysis,  Gi: Denies swallowing difficulty, stomach pain, nausea or vomiting, diarrhea, constipation, bowel incontinence Gu:  Denies bladder incontinence, burning urine Ext:   Denies Joint pain, stiffness or swelling Skin: Denies  skin rash, easy bruising or bleeding or hives Endoc:  Denies polyuria, polydipsia , polyphagia or weight change Psych:   Denies depression, insomnia or hallucinations   Other:  All other systems negative   VS: BP 135/80 (BP Location: Left Arm)   Pulse (!) 109   Temp 98.1 F (36.7 C) (Oral)   Resp 18   Ht 6' (1.829 m)   Wt 81.6 kg   SpO2 91%   BMI 24.41 kg/m      PHYSICAL EXAM    GENERAL:NAD, no fevers, chills, no weakness no fatigue HEAD: Normocephalic, atraumatic.  EYES: Pupils equal, round, reactive to light. Extraocular muscles intact. No scleral icterus.  MOUTH: Moist mucosal membrane. Dentition intact. No abscess noted.  EAR, NOSE, THROAT: Clear without exudates. No external lesions.  NECK: Supple. No thyromegaly. No nodules. No JVD.  PULMONARY: Diffuse coarse rhonchi right sided +wheezes CARDIOVASCULAR: S1 and S2. Regular rate and rhythm. No murmurs, rubs, or gallops. No edema. Pedal pulses 2+ bilaterally.  GASTROINTESTINAL: Soft, nontender, nondistended. No masses. Positive bowel sounds. No hepatosplenomegaly.  MUSCULOSKELETAL: No swelling, clubbing, or  edema. Range of motion full in all extremities.  NEUROLOGIC: Cranial nerves II through XII are intact. No gross focal neurological deficits. Sensation intact. Reflexes intact.  SKIN: crusted feet with erythemaouts intertrigo. PSYCHIATRIC: Mood, affect within normal limits. The patient is awake, alert and oriented x 3. Insight, judgment intact.       IMAGING    CT Chest W Contrast  Result Date: 06/18/2020 CLINICAL DATA:  69 year old male with shortness of breath, productive cough, pneumonia. On home oxygen. EXAM: CT CHEST WITH CONTRAST TECHNIQUE: Multidetector CT imaging of the chest was performed during intravenous contrast administration. CONTRAST:  66mL OMNIPAQUE IOHEXOL 300 MG/ML  SOLN COMPARISON:  Portable chest earlier today.  Chest CT 01/17/2020. FINDINGS: Cardiovascular: No cardiomegaly or pericardial effusion. Calcified coronary artery and aortic atherosclerosis. The central vascular structures of the mediastinum are enhancing and appear to be patent. Mediastinum/Nodes: Mildly increased in reactive appearing mediastinal lymph nodes compared to 2020, up to 14 mm short axis. Some nodes (right paratracheal on series 2, image 47) have not significantly changed. Additionally, there is evidence of  a larger 15 mm short axis left AP window node. Lungs/Pleura: Chronic pulmonary hyperinflation. Extensive chronic emphysema. Previous bullous emphysema in the left upper lung where now there is extensive consolidation with numerous small rounded areas of opacity suspicious for cavitation. There is a prominent fluid level lateral to the left hilum on series 3, image 65 suspicious for lung abscess. Abnormal interstitial thickening and peribronchial nodularity throughout the left lung. No superimposed pneumothorax. Only trace left pleural fluid. The major airways remain patent. Right lung emphysema and markings appear stable since last year. Upper Abdomen: Negative visible liver, gallbladder, spleen, pancreas,  adrenal glands, kidneys, and bowel in the upper abdomen. Musculoskeletal: Chronic compression fractures and ankylosis in the spine, with several levels of previous vertebral body augmentation, appears stable since last year. Occasional chronic rib fractures. No acute osseous abnormality identified. No chest wall abnormality. IMPRESSION: 1. 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 Consider necrotizing pneumonia as multiple areas are suspicious for cavitation, and especially a fluid-level lateral to the left hilum (series 2, image 68) strongly suggesting Lung Abscess. 2. Right lung unaffected.  Only trace left pleural fluid. 3. Reactive appearing mediastinal lymph nodes. 4. Calcified coronary artery and Aortic Atherosclerosis (ICD10-I70.0). Emphysema (ICD10-J43.9). Electronically Signed   By: Genevie Ann M.D.   On: 06/18/2020 22:14   DG Chest Portable 1 View  Result Date: 06/18/2020 CLINICAL DATA:  Shortness of breath all summer lung, worsening last week, productive cough, congestion EXAM: PORTABLE CHEST 1 VIEW COMPARISON:  CT 01/17/2019, radiograph 01/17/2019 FINDINGS: There is a background chronic reticular and fibrotic interstitial changes throughout both lungs albeit with new area of superimposed consolidative opacity spanning the left mid lung to apex including an area of potential cavitation seen more peripherally. No discernible pneumothorax or visible effusion. Cardiomediastinal contours are partially obscured by overlying opacity with the visible borders similar in appearance to the comparison studies from prior. No acute osseous or soft tissue abnormality. Degenerative changes are present in the imaged spine and shoulders. Telemetry leads overlie the chest. IMPRESSION: Consolidative opacity in the left mid lung to apex with some questionable cavitation peripherally. Recommend further characterization with CT of the chest with contrast if patient is able to tolerate. Findings  superimposed on chronic reticular and fibrotic interstitial changes. These results were called by telephone at the time of interpretation on 06/18/2020 at 8:45 pm to provider [facility] , who verbally acknowledged these results. Electronically Signed   By: Lovena Le M.D.   On: 06/18/2020 20:45      ASSESSMENT/PLAN   Left lung necrortizing pneumonia with abscess -patient with poor dentition possible anaerobic etiology -will send off sputum cultures patient states he can cough phlegm -he is altered with encephalopathy -sputum culture x 3 -AFB sputum -histo/strep pneumo/legionella urine ag -blood cultures -MRSA nasal PCR -procalcitonin trend -defer antimicrobial regimen to ID - currently on Unasyn>>rocephin +flagyl +vancomycin -poor prognosis - palliative evaluation    Advanced COPD with centrilobular emphysema  - chronic hypoxemia with increased O2 requirement  -his emphysema is beyond repair and patient has very poor prognosis -typical COPD carepath with duoneb, bronchopulmonary hygiene, steroids and antibiotics is appropriate for inpatient therapy   Altered mental status with confusion   - possible septic encephalitis, patient admits to drinking alcohol  - VBG does not support hypercarbic encephalopathy   Lower extermity foot wounds  - wound care  And podiatry evaluation - possible  Cellulitis/intertrigo      Thank you for allowing me to participate in  the care of this patient.   Patient/Family are satisfied with care plan and all questions have been answered.  This document was prepared using Dragon voice recognition software and may include unintentional dictation errors.     Ottie Glazier, M.D.  Division of [place]

## 2020-06-20 NOTE — Progress Notes (Signed)
PT Cancellation Note  Patient Details Name: Mark [name] MRN: [id number] DOB: [birth year]   Cancelled Treatment:    Reason Eval/Treat Not Completed: Medical issues which prohibited therapy (Patient's HR is 116 bpm and SpO2 is 86% at rest on 6L/min O2. No heart monitor to track waveform. Patient highly verbose and continually speaking throughout attempts to initiate PT eval. Pateint speaking on phone for >5 min. Consulted with nursing reccomended hold due to low SpO2. Patient not medically appropriate for PT at this time due to high resting HR and low SpO2 with current O2 delivery system at highest available. Will re-attempt PT eval at later time/date when patient is medically ready)   Everlean Alstrom. Graylon Good, PT, DPT 06/20/20, 10:56 AM

## 2020-06-20 NOTE — Consult Note (Signed)
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 male patient admitted with above medical problems in the face of anxiety and possible capacity issues and wanting to leave too soon   HPI:  Kept on regular Klonopin bid he now feels better.  He does not want to take further antidepressants that would help with his issues and feels he will stay in house.   He has a long history of major depression along with panic, anxiety and generalized anxiety   He is not currently seeing psychiatry but had one admission long ago in the 4's    Generally does okay but felt overwhelmed with all the issues medically and all staff where he felt too overwhelmed  He contracts for safety   And understands the risk of his issues if discharged too soon and the benefits of being treated   Past Psychiatric History:   As above, currently not interested in outpatient treatment---wants to stay with patient     Risk to Self:  --none  Risk to Others:  none  Prior Inpatient Therapy:  not recently  Prior Outpatient Therapy:   none   Past Medical History:  Past Medical History:  Diagnosis Date  . Acute respiratory failure with hypoxia and hypercapnia ([place]) 05/01/2016  . Asthma   . COPD (chronic obstructive pulmonary disease) ([place])   . Osteoporosis     Past Surgical History:  Procedure Laterality Date  . BACK SURGERY     Family History:  Family History  Problem  Relation Age of Onset  . Pulmonary fibrosis Mother    Family Psychiatric  History:  None active  Social History:  Lives alone on disability no kids  --no major structure at home per se  Sister and brother visit him.   No major relationships  Social History   Substance and Sexual Activity  Alcohol Use No     Social History   Substance and Sexual Activity  Drug Use No    Social History   Socioeconomic History  . Marital status: Single    Spouse name: NA  . Number of children: 0  . Years of education: 7  . Highest education level: Bachelor's degree (e.g., BA, AB, BS)  Occupational History  . Not on file  Tobacco Use  . Smoking status: Former Research scientist (life sciences)  . Smokeless tobacco: Never Used  Substance and Sexual Activity  . Alcohol use: No  . Drug use: No  . Sexual activity: Not Currently  Other Topics Concern  . Not on file  Social History Narrative  . Not on file   Social Determinants of Health   Financial Resource Strain:   . Difficulty of Paying Living Expenses: Not on file  Food Insecurity:   . Worried About Charity fundraiser in the Last Year: Not on file  . Ran Out of Food in the Last Year: Not on file  Transportation Needs:   . Lack of Transportation (Medical): Not on file  .  Lack of Transportation (Non-Medical): Not on file  Physical Activity:   . Days of Exercise per Week: Not on file  . Minutes of Exercise per Session: Not on file  Stress:   . Feeling of Stress : Not on file  Social Connections:   . Frequency of Communication with Friends and Family: Not on file  . Frequency of Social Gatherings with Friends and Family: Not on file  . Attends Religious Services: Not on file  . Active Member of Clubs or Organizations: Not on file  . Attends Archivist Meetings: Not on file  . Marital Status: Not on file   Additional Social History:     No drugs or ETOH issues   Allergies:  No Known Allergies  Labs:  Results for orders placed or performed  during the hospital encounter of 06/18/20 (from the past 48 hour(s))  Basic metabolic panel     Status: Abnormal   Collection Time: 06/18/20  8:14 PM  Result Value Ref Range   Sodium 134 (L) 135 - 145 mmol/L   Potassium 3.4 (L) 3.5 - 5.1 mmol/L   Chloride 90 (L) 98 - 111 mmol/L   CO2 28 22 - 32 mmol/L   Glucose, Bld 121 (H) 70 - 99 mg/dL    Comment: Glucose reference range applies only to samples taken after fasting for at least 8 hours.   BUN 20 8 - 23 mg/dL   Creatinine, Ser 0.69 0.61 - 1.24 mg/dL   Calcium 8.3 (L) 8.9 - 10.3 mg/dL   GFR calc non Af Amer >60 >60 mL/min   GFR calc Af Amer >60 >60 mL/min   Anion gap 16 (H) 5 - 15    Comment: Performed at [facility], [address] [place], [place] [address]  CBC     Status: Abnormal   Collection Time: 06/18/20  8:14 PM  Result Value Ref Range   WBC 22.9 (H) 4.0 - 10.5 K/uL   RBC 4.76 4.22 - 5.81 MIL/uL   Hemoglobin 14.0 13.0 - 17.0 g/dL   HCT 41.7 39 - 52 %   MCV 87.6 80.0 - 100.0 fL   MCH 29.4 26.0 - 34.0 pg   MCHC 33.6 30.0 - 36.0 g/dL   RDW 13.7 11.5 - 15.5 %   Platelets 474 (H) 150 - 400 K/uL   nRBC 0.0 0.0 - 0.2 %    Comment: Performed at [facility], [place], [place] [address]  Troponin I (High Sensitivity)     Status: Abnormal   Collection Time: 06/18/20  8:14 PM  Result Value Ref Range   Troponin I (High Sensitivity) 26 (H) <18 ng/L    Comment: (NOTE) Elevated high sensitivity troponin I (hsTnI) values and significant  changes across serial measurements may suggest ACS but many other  chronic and acute conditions are known to elevate hsTnI results.  Refer to the "Links" section for chest pain algorithms and additional  guidance. Performed at [facility], [address] [place], [place] [address]   Brain natriuretic peptide     Status: Abnormal   Collection Time: 06/18/20  8:14 PM  Result Value Ref Range   B Natriuretic Peptide 238.2 (H) 0.0 - 100.0 pg/mL     Comment: Performed at [facility], [address] [place], [place] [address]  Blood gas, venous     Status: Abnormal   Collection Time: 06/18/20  8:28 PM  Result Value Ref Range   pH,  Ven 7.40 7.25 - 7.43   pCO2, Ven 57 44 - 60 mmHg   pO2, Ven 119.0 (H) 32 - 45 mmHg   Bicarbonate 35.3 (H) 20.0 - 28.0 mmol/L   Acid-Base Excess 8.4 (H) 0.0 - 2.0 mmol/L   O2 Saturation 98.6 %   Patient temperature 37.0    Collection site LINE    Sample type VENOUS     Comment: Performed at [facility], [address] [place], [place] [address]  SARS Coronavirus 2 by RT PCR (hospital order, performed in [facility] hospital lab) Nasopharyngeal Nasopharyngeal Swab     Status: None   Collection Time: 06/18/20  8:53 PM   Specimen: Nasopharyngeal Swab  Result Value Ref Range   SARS Coronavirus 2 NEGATIVE NEGATIVE    Comment: (NOTE) SARS-CoV-2 target nucleic acids are NOT DETECTED.  The SARS-CoV-2 RNA is generally detectable in upper and lower respiratory specimens during the acute phase of infection. The lowest concentration of SARS-CoV-2 viral copies this assay can detect is 250 copies / mL. A negative result does not preclude SARS-CoV-2 infection and should not be used as the sole basis for treatment or other patient management decisions.  A negative result may occur with improper specimen collection / handling, submission of specimen other than nasopharyngeal swab, presence of viral mutation(s) within the areas targeted by this assay, and inadequate number of viral copies (<250 copies / mL). A negative result must be combined with clinical observations, patient history, and epidemiological information.  Fact Sheet for Patients:   StrictlyIdeas.no  Fact Sheet for Healthcare Providers: BankingDealers.co.za  This test is not yet approved or  cleared by the [place] FDA and has been authorized for detection and/or diagnosis of  SARS-CoV-2 by FDA under an Emergency Use Authorization (EUA).  This EUA will remain in effect (meaning this test can be used) for the duration of the COVID-19 declaration under Section 564(b)(1) of the Act, 21 U.S.C. section 360bbb-3(b)(1), unless the authorization is terminated or revoked sooner.  Performed at [facility], [address] [place], [place] [address]   Culture, blood (routine x 2)     Status: None (Preliminary result)   Collection Time: 06/18/20  8:54 PM   Specimen: BLOOD  Result Value Ref Range   Specimen Description BLOOD RIGHT FA    Special Requests      BOTTLES DRAWN AEROBIC AND ANAEROBIC Blood Culture adequate volume   Culture      NO GROWTH 2 DAYS Performed at [facility], [address] [place], [place] [address]    Report Status PENDING   Culture, blood (routine x 2)     Status: None (Preliminary result)   Collection Time: 06/18/20  8:54 PM   Specimen: BLOOD  Result Value Ref Range   Specimen Description BLOOD LEFT FA    Special Requests      BOTTLES DRAWN AEROBIC AND ANAEROBIC Blood Culture adequate volume   Culture      NO GROWTH 2 DAYS Performed at [facility], [address], [place], [place] [address]    Report Status PENDING   Lactic acid, plasma     Status: None   Collection Time: 06/18/20  8:54 PM  Result Value Ref Range   Lactic Acid, Venous 1.1 0.5 - 1.9 mmol/L    Comment: Performed at [facility], [address] [place], [place] [address]  Troponin I (High Sensitivity)     Status: Abnormal   Collection Time: 06/18/20 11:56  PM  Result Value Ref Range   Troponin I (High Sensitivity) 23 (H) <18 ng/L    Comment: (NOTE) Elevated high sensitivity troponin I (hsTnI) values and significant  changes across serial measurements may suggest ACS but many other  chronic and acute conditions are known to elevate hsTnI results.  Refer to the "Links" section for chest pain algorithms and additional   guidance. Performed at [facility], [address] [place], [place] [address]   Strep pneumoniae urinary antigen     Status: None   Collection Time: 06/18/20 11:56 PM  Result Value Ref Range   Strep Pneumo Urinary Antigen NEGATIVE NEGATIVE    Comment:        Infection due to S. pneumoniae cannot be absolutely ruled out since the antigen present may be below the detection limit of the test. Performed at [facility], [address] [place], [place] [address]   HIV Antibody (routine testing w rflx)     Status: None   Collection Time: 06/19/20  5:06 AM  Result Value Ref Range   HIV Screen 4th Generation wRfx Non Reactive Non Reactive    Comment: Performed at [facility], [place] [address] [place], [place] [address]  CBC     Status: Abnormal   Collection Time: 06/19/20  5:06 AM  Result Value Ref Range   WBC 20.3 (H) 4.0 - 10.5 K/uL   RBC 4.69 4.22 - 5.81 MIL/uL   Hemoglobin 13.5 13.0 - 17.0 g/dL   HCT 40.9 39 - 52 %   MCV 87.2 80.0 - 100.0 fL   MCH 28.8 26.0 - 34.0 pg   MCHC 33.0 30.0 - 36.0 g/dL   RDW 13.6 11.5 - 15.5 %   Platelets 488 (H) 150 - 400 K/uL   nRBC 0.0 0.0 - 0.2 %    Comment: Performed at [facility], [address] [place], [place] [address]  Basic metabolic panel     Status: Abnormal   Collection Time: 06/19/20  5:06 AM  Result Value Ref Range   Sodium 135 135 - 145 mmol/L   Potassium 3.9 3.5 - 5.1 mmol/L   Chloride 91 (L) 98 - 111 mmol/L   CO2 31 22 - 32 mmol/L   Glucose, Bld 156 (H) 70 - 99 mg/dL    Comment: Glucose reference range applies only to samples taken after fasting for at least 8 hours.   BUN 20 8 - 23 mg/dL   Creatinine, Ser 0.72 0.61 - 1.24 mg/dL   Calcium 8.4 (L) 8.9 - 10.3 mg/dL   GFR calc non Af Amer >60 >60 mL/min   GFR calc Af Amer >60 >60 mL/min   Anion gap 13 5 - 15    Comment: Performed at [facility], [address] [place], [place] [address]  TSH     Status: None   Collection  Time: 06/19/20  5:06 AM  Result Value Ref Range   TSH 0.459 0.350 - 4.500 uIU/mL    Comment: Performed by a 3rd Generation assay with a functional sensitivity of <=0.01 uIU/mL. Performed at [facility], [address] [place],  [address]   Procalcitonin - Baseline     Status: None   Collection Time: 06/19/20  5:06 AM  Result Value Ref Range   Procalcitonin 0.81 ng/mL    Comment:        Interpretation: PCT > 0.5 ng/mL and <= 2 ng/mL: Systemic infection (sepsis) is possible, but  other conditions are known to elevate PCT as well. (NOTE)       Sepsis PCT Algorithm           Lower Respiratory Tract                                      Infection PCT Algorithm    ----------------------------     ----------------------------         PCT < 0.25 ng/mL                PCT < 0.10 ng/mL          Strongly encourage             Strongly discourage   discontinuation of antibiotics    initiation of antibiotics    ----------------------------     -----------------------------       PCT 0.25 - 0.50 ng/mL            PCT 0.10 - 0.25 ng/mL               OR       >80% decrease in PCT            Discourage initiation of                                            antibiotics      Encourage discontinuation           of antibiotics    ----------------------------     -----------------------------         PCT >= 0.50 ng/mL              PCT 0.26 - 0.50 ng/mL                AND       <80% decrease in PCT             Encourage initiation of                                             antibiotics       Encourage continuation           of antibiotics    ----------------------------     -----------------------------        PCT >= 0.50 ng/mL                  PCT > 0.50 ng/mL               AND         increase in PCT                  Strongly encourage                                      initiation of antibiotics    Strongly encourage escalation           of antibiotics                                      -----------------------------  PCT <= 0.25 ng/mL                                                 OR                                        > 80% decrease in PCT                                      Discontinue / Do not initiate                                             antibiotics  Performed at [facility], [address] [place], [place] [address]   Culture, sputum-assessment     Status: None   Collection Time: 06/19/20  7:28 AM   Specimen: Sputum  Result Value Ref Range   Specimen Description SPUTUM    Special Requests NONE    Sputum evaluation      THIS SPECIMEN IS ACCEPTABLE FOR SPUTUM CULTURE Performed at [facility], [address] [place], [place] [address]    Report Status 06/19/2020 FINAL   Culture, respiratory     Status: None (Preliminary result)   Collection Time: 06/19/20  7:28 AM   Specimen: SPU  Result Value Ref Range   Specimen Description      SPUTUM Performed at [facility], [address], [place], [place] [address]    Special Requests      NONE Reflexed from [id number] Performed at [facility], [place], [place] [address]    Gram Stain      MODERATE WBC PRESENT,BOTH PMN AND MONONUCLEAR MODERATE GRAM POSITIVE COCCI FEW GRAM VARIABLE ROD RARE YEAST    Culture      CULTURE REINCUBATED FOR BETTER GROWTH Performed at [facility], [place] [address] [place], [place] [address]    Report Status PENDING   CBC     Status: Abnormal   Collection Time: 06/20/20  4:45 AM  Result Value Ref Range   WBC 21.6 (H) 4.0 - 10.5 K/uL   RBC 4.36 4.22 - 5.81 MIL/uL   Hemoglobin 13.0 13.0 - 17.0 g/dL   HCT 39.1 39 - 52 %   MCV 89.7 80.0 - 100.0 fL   MCH 29.8 26.0 - 34.0 pg   MCHC 33.2 30.0 - 36.0 g/dL   RDW 14.0 11.5 - 15.5 %   Platelets 511 (H) 150 - 400 K/uL   nRBC 0.0 0.0 - 0.2 %    Comment: Performed at [facility], [address] [place], [place] [address]  Basic metabolic panel     Status: Abnormal   Collection Time: 06/20/20  4:45 AM  Result Value Ref Range   Sodium 136 135 - 145 mmol/L   Potassium 3.9 3.5 - 5.1 mmol/L   Chloride 92 (L) 98 - 111 mmol/L   CO2 33 (H) 22 - 32 mmol/L   Glucose, Bld 110 (H) 70 - 99 mg/dL    Comment: Glucose  reference range applies only to samples taken after fasting for at least 8 hours.   BUN 32 (H) 8 - 23 mg/dL   Creatinine, Ser 0.83 0.61 - 1.24 mg/dL   Calcium 8.7 (L) 8.9 - 10.3 mg/dL   GFR calc non Af Amer >60 >60 mL/min   GFR calc Af Amer >60 >60 mL/min   Anion gap 11 5 - 15    Comment: Performed at [facility], [address] [place], [place] [address]  Procalcitonin     Status: None   Collection Time: 06/20/20  4:45 AM  Result Value Ref Range   Procalcitonin 0.57 ng/mL    Comment:        Interpretation: PCT > 0.5 ng/mL and <= 2 ng/mL: Systemic infection (sepsis) is possible, but other conditions are known to elevate PCT as well. (NOTE)       Sepsis PCT Algorithm           Lower Respiratory Tract                                      Infection PCT Algorithm    ----------------------------     ----------------------------         PCT < 0.25 ng/mL                PCT < 0.10 ng/mL          Strongly encourage             Strongly discourage   discontinuation of antibiotics    initiation of antibiotics    ----------------------------     -----------------------------       PCT 0.25 - 0.50 ng/mL            PCT 0.10 - 0.25 ng/mL               OR       >80% decrease in PCT            Discourage initiation of                                            antibiotics      Encourage discontinuation           of antibiotics    ----------------------------     -----------------------------         PCT >= 0.50 ng/mL              PCT 0.26 - 0.50 ng/mL                AND       <80% decrease in PCT             Encourage initiation of                                              antibiotics       Encourage continuation           of antibiotics    ----------------------------     -----------------------------        PCT >= 0.50 ng/mL  PCT > 0.50 ng/mL               AND         increase in PCT                  Strongly encourage                                      initiation of antibiotics    Strongly encourage escalation           of antibiotics                                     -----------------------------                                           PCT <= 0.25 ng/mL                                                 OR                                        > 80% decrease in PCT                                      Discontinue / Do not initiate                                             antibiotics  Performed at [facility], [address], [place], [place] [address]     Current Facility-Administered Medications  Medication Dose Route Frequency Provider Last Rate Last Admin  . albuterol (PROVENTIL) (2.5 MG/3ML) 0.083% nebulizer solution 3 mL  3 mL Inhalation Q4H PRN Lenore Cordia, MD   3 mL at 06/19/20 1123  . cefTRIAXone (ROCEPHIN) 2 g in sodium chloride 0.9 % 100 mL IVPB  2 g Intravenous Q24H Tsosie Billing, MD   Stopped at 06/19/20 1829  . chlorpheniramine-HYDROcodone (TUSSIONEX) 10-8 MG/5ML suspension 5 mL  5 mL Oral Q4H PRN Max Sane, MD   5 mL at 06/19/20 1630  . clonazePAM (KLONOPIN) tablet 1 mg  1 mg Oral BID Zada Finders R, MD   1 mg at 06/19/20 2038  . enoxaparin (LOVENOX) injection 40 mg  40 mg Subcutaneous Q24H Patel, Vishal R, MD      . fluticasone furoate-vilanterol (BREO ELLIPTA) 100-25 MCG/INH 1 puff  1 puff Inhalation Daily Lu Duffel, RPH   1 puff at 06/20/20 0824   And  . umeclidinium bromide (INCRUSE ELLIPTA) 62.5 MCG/INH 1 puff  1 puff Inhalation Daily Lu Duffel, RPH   1 puff at 06/20/20 2878  . guaiFENesin (MUCINEX) 12 hr tablet 1,200 mg  1,200 mg Oral BID Lenore Cordia, MD  1,200 mg at 06/20/20 0834  . ketorolac (TORADOL) 30 MG/ML injection 30 mg  30 mg Intravenous Once Sharion Settler, NP      . levothyroxine (SYNTHROID) tablet 25 mcg  25 mcg Oral QAC breakfast Lenore Cordia, MD   25 mcg at 06/20/20 0429  . metroNIDAZOLE (FLAGYL) IVPB 500 mg  500 mg Intravenous Q8H Ravishankar, Joellyn Quails, MD 100 mL/hr at 06/20/20 0425 500 mg at 06/20/20 0425  . vancomycin (VANCOCIN) IVPB 1000 mg/200 mL premix  1,000 mg Intravenous Q8H Benn Moulder, RPH 200 mL/hr at 06/20/20 0824 1,000 mg at 06/20/20 3491    Musculoskeletal: Strength & Muscle Tone:  No new change  Gait & Station: not clear lying in bed  Patient leans: n/a   Akathisia none Handedness not known Recall okay Cognition --fair he rambles and is overinclusive and circumstantial Assets --supportive family  Sleep on and off ADL's at present needs assistance Language  Englsh Aims not needed Psychomotor about the same     Oriented times four Consciousness not clouded or fluctuant Looks odd eccentric Rapport fair and eye contact okay   No shakes tics tremors Thought process and content --preoccupied with mundane, no frank psychosis or mania is circumstantial hard for him to get to the point  Unpleasant for listener cannot get word in edgewise No active SI HI or plans  Abstraction fair  Speech --loud pressured  Memory remote recent immediate okay  Fund of knowledge and intelligence fair  Judgement insight reliability fair    Patient has capacity for consent because he does understand nature of general illlness and the implications of treatment and to not be treated and what would happen with each          Psychiatric Specialty Exam: Physical Exam  Review of Systems  Blood pressure (!) 158/82, pulse (!) 109, temperature 98.6 F (37 C), resp. rate 18, height 6' (1.829 m), weight 81.6 kg, SpO2 91 %.Body mass index is 24.41 kg/m.   Treatment Plan Summary:   Continue  present meds and plan   Follow PRN for now   Disposition:  Per medicine and all mgt       Eulas Post, MD 06/20/2020 12:35 PM

## 2020-06-20 NOTE — Progress Notes (Signed)
PROGRESS NOTE    Mark [name]  SEG:[id number] DOB: 1950/12/28 DOA: 06/18/2020 PCP: Sandra Cockayne, MD    Brief Narrative:  [name] a 69 y.o.malewith medical history significant forsevere COPD and chronic respiratory failure with hypoxia on 4 L supplemental O2 via [place] chronically, hypothyroidism, and anxiety on chronic benzodiazepines who presents to the ED for evaluation of progressive shortness of breath.  Patient states he has been feeling unwell all summer long. He says he has been intolerant to the heat and pre much confined to his apartment. He has been getting progressively worsening shortness of breath and dyspnea on exertion. At baseline he has a chronic cough usually productive of clear sputum however the last 2 weeks he has had purulent yellow malodorous sputum production. He reports subjective fevers, chills, diaphoresis. He is a former smoker and states he quit at the beginning of the COVID-19 pandemic. He reports a history of right-sided lung abscess in 2009. He says that he has become progressively weak and has difficulty getting out of bed recently to even go to the bathroom.  ED Course: Initial vitals showed BP 153/90, pulse 120, RR 22, temp 98.5 Fahrenheit, SPO2 99% initially on nonrebreather. He was weaned down to his home 4 L supplemental O2 via [place] maintaining O2 saturations >88%.  Labs showed WBC 22.9, hemoglobin 14.0, platelets 474,000, sodium 134, potassium 3.4, bicarb 28, BUN 20, creatinine 0.69, serum glucose 121, BNP 238.2, high-sensitivity troponin I 26. VBG showed pH 7.4, PCO2 57, PO2 119. Lactic acid 1.1.  SARS-CoV-2 PCR is negative. Blood cultures pending.  Portable chest x-ray showed consolidative opacity in the left midlung with cavitary changes.  CT chest with contrast was obtained and showed chronic severe emphysema with superimposed left multilobar pneumonia and findings suggestive of necrotizing pneumonia with multiple areas  suspicious for cavitation and suspected lung abscess lateral to the left hilum.  Patient was given IV vancomycin and cefepime, IV Solu-Medrol 125 mg, 1 L LR, and DuoNeb treatment.     Consultants: ,   Psychiatry, pulmonology, palliative, TOC, ID  Procedures:   Antimicrobials:   IV vancomycin, metronidazole, ceftriaxone-9/17  IV Unasyn-discontinue   Subjective: Has no complaints.  Very talkative.  Denies shortness of breath.  Does report coughing.  Objective: Vitals:   06/20/20 0841 06/20/20 1007 06/20/20 1012 06/20/20 1553  BP:  (!) 158/82  135/81  Pulse:  (!) 109  (!) 109  Resp:  18  20  Temp:  98.6 F (37 C)  98.6 F (37 C)  TempSrc:      SpO2: 91% (!) 89% 91% 98%  Weight:      Height:        Intake/Output Summary (Last 24 hours) at 06/20/2020 1614 Last data filed at 06/20/2020 1408 Gross per 24 hour  Intake 92.92 ml  Output 800 ml  Net -707.08 ml   Filed Weights   06/18/20 2006  Weight: 81.6 kg    Examination:  General exam: Appears calm and comfortable  Respiratory system: Clear to auscultation. Respiratory effort normal. Cardiovascular system: S1 & S2 heard, RRR. No JVD, murmurs, rubs, gallops or clicks.  Gastrointestinal system: Abdomen is nondistended, soft and nontender.  Normal bowel sounds heard. Central nervous system: Difficult to assess, appears to know person and date but not place Extremities: Mild edema bilaterally Skin: No rashes, lesions or ulcers Psychiatry:Mood & affect inappropriate-extra talkative about different topics    Data Reviewed: I have personally reviewed following labs and imaging studies  CBC: Recent Labs  Lab 06/18/20 2014 06/19/20 0506 06/20/20 0445  WBC 22.9* 20.3* 21.6*  HGB 14.0 13.5 13.0  HCT 41.7 40.9 39.1  MCV 87.6 87.2 89.7  PLT 474* 488* 696*   Basic Metabolic Panel: Recent Labs  Lab 06/18/20 2014 06/19/20 0506 06/20/20 0445  NA 134* 135 136  K 3.4* 3.9 3.9  CL 90* 91* 92*  CO2 28 31 33*    GLUCOSE 121* 156* 110*  BUN 20 20 32*  CREATININE 0.69 0.72 0.83  CALCIUM 8.3* 8.4* 8.7*   GFR: Estimated Creatinine Clearance: 92.2 mL/min (by C-G formula based on SCr of 0.83 mg/dL). Liver Function Tests: No results for input(s): AST, ALT, ALKPHOS, BILITOT, PROT, ALBUMIN in the last 168 hours. No results for input(s): LIPASE, AMYLASE in the last 168 hours. No results for input(s): AMMONIA in the last 168 hours. Coagulation Profile: No results for input(s): INR, PROTIME in the last 168 hours. Cardiac Enzymes: No results for input(s): CKTOTAL, CKMB, CKMBINDEX, TROPONINI in the last 168 hours. BNP (last 3 results) No results for input(s): PROBNP in the last 8760 hours. HbA1C: No results for input(s): HGBA1C in the last 72 hours. CBG: No results for input(s): GLUCAP in the last 168 hours. Lipid Profile: No results for input(s): CHOL, HDL, LDLCALC, TRIG, CHOLHDL, LDLDIRECT in the last 72 hours. Thyroid Function Tests: Recent Labs    06/19/20 0506  TSH 0.459   Anemia Panel: No results for input(s): VITAMINB12, FOLATE, FERRITIN, TIBC, IRON, RETICCTPCT in the last 72 hours. Sepsis Labs: Recent Labs  Lab 06/18/20 2054 06/19/20 0506 06/20/20 0445  PROCALCITON  --  0.81 0.57  LATICACIDVEN 1.1  --   --     Recent Results (from the past 240 hour(s))  SARS Coronavirus 2 by RT PCR (hospital order, performed in [facility] hospital lab) Nasopharyngeal Nasopharyngeal Swab     Status: None   Collection Time: 06/18/20  8:53 PM   Specimen: Nasopharyngeal Swab  Result Value Ref Range Status   SARS Coronavirus 2 NEGATIVE NEGATIVE Final    Comment: (NOTE) SARS-CoV-2 target nucleic acids are NOT DETECTED.  The SARS-CoV-2 RNA is generally detectable in upper and lower respiratory specimens during the acute phase of infection. The lowest concentration of SARS-CoV-2 viral copies this assay can detect is 250 copies / mL. A negative result does not preclude SARS-CoV-2 infection and  should not be used as the sole basis for treatment or other patient management decisions.  A negative result may occur with improper specimen collection / handling, submission of specimen other than nasopharyngeal swab, presence of viral mutation(s) within the areas targeted by this assay, and inadequate number of viral copies (<250 copies / mL). A negative result must be combined with clinical observations, patient history, and epidemiological information.  Fact Sheet for Patients:   StrictlyIdeas.no  Fact Sheet for Healthcare Providers: BankingDealers.co.za  This test is not yet approved or  cleared by the [place] FDA and has been authorized for detection and/or diagnosis of SARS-CoV-2 by FDA under an Emergency Use Authorization (EUA).  This EUA will remain in effect (meaning this test can be used) for the duration of the COVID-19 declaration under Section 564(b)(1) of the Act, 21 U.S.C. section 360bbb-3(b)(1), unless the authorization is terminated or revoked sooner.  Performed at [facility], [address] [place], [place] [address]   Culture, blood (routine x 2)     Status: None (Preliminary result)   Collection Time: 06/18/20  8:54 PM   Specimen: BLOOD  Result Value Ref Range Status   Specimen Description BLOOD RIGHT FA  Final   Special Requests   Final    BOTTLES DRAWN AEROBIC AND ANAEROBIC Blood Culture adequate volume   Culture   Final    NO GROWTH 2 DAYS Performed at [facility], [address] [place], [place] [address]    Report Status PENDING  Incomplete  Culture, blood (routine x 2)     Status: None (Preliminary result)   Collection Time: 06/18/20  8:54 PM   Specimen: BLOOD  Result Value Ref Range Status   Specimen Description BLOOD LEFT FA  Final   Special Requests   Final    BOTTLES DRAWN AEROBIC AND ANAEROBIC Blood Culture adequate volume   Culture   Final    NO GROWTH 2  DAYS Performed at [facility], [address], [place], [place] [address]    Report Status PENDING  Incomplete  Culture, sputum-assessment     Status: None   Collection Time: 06/19/20  7:28 AM   Specimen: Sputum  Result Value Ref Range Status   Specimen Description SPUTUM  Final   Special Requests NONE  Final   Sputum evaluation   Final    THIS SPECIMEN IS ACCEPTABLE FOR SPUTUM CULTURE Performed at [facility], [address] [place], [place] [address]    Report Status 06/19/2020 FINAL  Final  Culture, respiratory     Status: None (Preliminary result)   Collection Time: 06/19/20  7:28 AM   Specimen: SPU  Result Value Ref Range Status   Specimen Description   Final    SPUTUM Performed at [facility], [address], [place], [place] [address]    Special Requests   Final    NONE Reflexed from [id number] Performed at [facility], [address] [place], [place] [address]    Gram Stain   Final    MODERATE WBC PRESENT,BOTH PMN AND MONONUCLEAR MODERATE GRAM POSITIVE COCCI FEW GRAM VARIABLE ROD RARE YEAST    Culture   Final    CULTURE REINCUBATED FOR BETTER GROWTH Performed at [facility], [place] [address], [place], [place] [address]    Report Status PENDING  Incomplete         Radiology Studies: CT Chest W Contrast  Result Date: 06/18/2020 CLINICAL DATA:  69 year old male with shortness of breath, productive cough, pneumonia. On home oxygen. EXAM: CT CHEST WITH CONTRAST TECHNIQUE: Multidetector CT imaging of the chest was performed during intravenous contrast administration. CONTRAST:  49mL OMNIPAQUE IOHEXOL 300 MG/ML  SOLN COMPARISON:  Portable chest earlier today.  Chest CT 01/17/2020. FINDINGS: Cardiovascular: No cardiomegaly or pericardial effusion. Calcified coronary artery and aortic atherosclerosis. The central vascular structures of the mediastinum are enhancing and appear to be patent. Mediastinum/Nodes: Mildly increased  in reactive appearing mediastinal lymph nodes compared to 2020, up to 14 mm short axis. Some nodes (right paratracheal on series 2, image 47) have not significantly changed. Additionally, there is evidence of a larger 15 mm short axis left AP window node. Lungs/Pleura: Chronic pulmonary hyperinflation. Extensive chronic emphysema. Previous bullous emphysema in the left upper lung where now there is extensive consolidation with numerous small rounded areas of opacity suspicious for cavitation. There is a prominent fluid level lateral to the left hilum on series 3, image 65 suspicious for lung abscess. Abnormal interstitial thickening and peribronchial nodularity throughout the left lung. No superimposed pneumothorax. Only trace left pleural fluid. The major airways remain patent. Right  lung emphysema and markings appear stable since last year. Upper Abdomen: Negative visible liver, gallbladder, spleen, pancreas, adrenal glands, kidneys, and bowel in the upper abdomen. Musculoskeletal: Chronic compression fractures and ankylosis in the spine, with several levels of previous vertebral body augmentation, appears stable since last year. Occasional chronic rib fractures. No acute osseous abnormality identified. No chest wall abnormality. IMPRESSION: 1. 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 Consider necrotizing pneumonia as multiple areas are suspicious for cavitation, and especially a fluid-level lateral to the left hilum (series 2, image 68) strongly suggesting Lung Abscess. 2. Right lung unaffected.  Only trace left pleural fluid. 3. Reactive appearing mediastinal lymph nodes. 4. Calcified coronary artery and Aortic Atherosclerosis (ICD10-I70.0). Emphysema (ICD10-J43.9). Electronically Signed   By: Genevie Ann M.D.   On: 06/18/2020 22:14   DG Chest Portable 1 View  Result Date: 06/18/2020 CLINICAL DATA:  Shortness of breath all summer lung, worsening last week, productive cough,  congestion EXAM: PORTABLE CHEST 1 VIEW COMPARISON:  CT 01/17/2019, radiograph 01/17/2019 FINDINGS: There is a background chronic reticular and fibrotic interstitial changes throughout both lungs albeit with new area of superimposed consolidative opacity spanning the left mid lung to apex including an area of potential cavitation seen more peripherally. No discernible pneumothorax or visible effusion. Cardiomediastinal contours are partially obscured by overlying opacity with the visible borders similar in appearance to the comparison studies from prior. No acute osseous or soft tissue abnormality. Degenerative changes are present in the imaged spine and shoulders. Telemetry leads overlie the chest. IMPRESSION: Consolidative opacity in the left mid lung to apex with some questionable cavitation peripherally. Recommend further characterization with CT of the chest with contrast if patient is able to tolerate. Findings superimposed on chronic reticular and fibrotic interstitial changes. These results were called by telephone at the time of interpretation on 06/18/2020 at 8:45 pm to provider [facility] , who verbally acknowledged these results. Electronically Signed   By: Lovena Le M.D.   On: 06/18/2020 20:45        Scheduled Meds: . clonazePAM  1 mg Oral BID  . enoxaparin (LOVENOX) injection  40 mg Subcutaneous Q24H  . fluticasone furoate-vilanterol  1 puff Inhalation Daily   And  . umeclidinium bromide  1 puff Inhalation Daily  . guaiFENesin  1,200 mg Oral BID  . ketorolac  30 mg Intravenous Once  . levothyroxine  25 mcg Oral QAC breakfast   Continuous Infusions: . cefTRIAXone (ROCEPHIN)  IV Stopped (06/19/20 1829)  . metronidazole 500 mg (06/20/20 1241)  . 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  Seems chronic based on patient reported history 9/18-IV Unasyn switched to vancomycin, Flagyl, Rocephin per IDs recommendation Strep pneumo urinary antigens negative AFB sputum Pulmonology following- poor px, palliative input was appreciated (refer to note) Psychiatry deemed patient has capacity for consent   Severe COPD and chronic hypoxic respiratory failure requiring 4 L oxygen via nasal cannula chronically Continue supplemental oxygen, appears to be at baseline  Anxiety  On chronic benzodiazepine  Continue Klonopin 1 mg p.o. twice daily, this was last refilled on 05/29/2020 by PCP office 9/18-Psychiatry was consulted -patient was deemed  to have capacity for consent . We will follow further recommendations   Hypokalemia - POA Was repleted.  Remains stable, K3.9 today  Onychomycosis & PVD in LEs Podiatry following -status post bedside nail debridement  Podiatry does not believe patient has cellulitis right now as it appears to be more related to microvascular peripheral vascular disease.   Can consider vascular evaluation if needed but currently doubt any intervention needed  Podiatry recommended nursing staff bathe patient and clean feet with normal sterile saline.  Patient may also use moisturizer on feet daily    Goals of care - will c/s palliative care. Palliative consulted due to poor prognosis please refer to note     DVT prophylaxis: Lovenox Code Status: Full Family Communication: None at bedside  Status is: Inpatient  Remains inpatient appropriate because:IV treatments appropriate due to intensity of illness or inability to take PO   Dispo: The patient is from: Home              Anticipated d/c is to: TBD              Anticipated d/c date is: > 3 days              Patient currently is not medically stable to  d/c.            LOS: 2 days   Time spent: 35 min with >50 % on coc    Mark Hanlon, MD [facility] Pager [phone]  If 7PM-7AM, please contact night-coverage [email or web address] Password [facility] 06/20/2020, 4:14 PM

## 2020-06-20 NOTE — Progress Notes (Signed)
Pt requesting to have cardiac diet changed to regular diet. MD notified, per MD Amery ok to change.

## 2020-06-21 LAB — PROCALCITONIN: Procalcitonin: 0.43 ng/mL

## 2020-06-21 LAB — LEGIONELLA PNEUMOPHILA SEROGP 1 UR AG: L. pneumophila Serogp 1 Ur Ag: NEGATIVE

## 2020-06-21 LAB — CULTURE, RESPIRATORY W GRAM STAIN: Culture: NORMAL

## 2020-06-21 NOTE — Consult Note (Signed)
Pharmacy Antibiotic Note  Mark [name] is a 69 y.o. male admitted on 06/18/2020 with lung abscess - left lung necrotizing pneumonia with cavitation chronic- mid lung - very likely aspiration. Patient presented with shortness of breath and purulent sputum. PMH includes COPD and chronic respiratory failure on 4L O2, osteoporosis, and asthma. Pharmacy has been consulted for vancomycin dosing.  ID and pulm following No MRSA PCR result available  Plan: Vancomycin 1500 mg IV x 1 dose - not giving full loading dose due to pt receiving 1000 mg IV x 1 on 9/16 @ 2200, followed by vancomycin 1000 mg IV every 8 hours. Will check trough at steady state - ordered for 9/19 at 2300.   Height: 6' (182.9 cm) Weight: 81.6 kg (180 lb) IBW/kg (Calculated) : 77.6  Temp (24hrs), Avg:98.2 F (36.8 C), Min:97.7 F (36.5 C), Max:98.6 F (37 C)  Recent Labs  Lab 06/18/20 2014 06/18/20 2054 06/19/20 0506 06/20/20 0445  WBC 22.9*  --  20.3* 21.6*  CREATININE 0.69  --  0.72 0.83  LATICACIDVEN  --  1.1  --   --     Estimated Creatinine Clearance: 92.2 mL/min (by C-G formula based on SCr of 0.83 mg/dL).    No Known Allergies  Antimicrobials this admission: 9/16 vancomycin >> 9/17 metfronidazole >> 9/17 ceftriaxone >> 9/16 cefepime x 1  9/17 Unasyn x 1  Microbiology results: 9/16 BCx: NGTD 9/17 Sputum: pending 9/17 MRSA PCR: pending  Thank you for allowing pharmacy to be a part of this patient's care.  Rayna Sexton, PharmD, BCPS Clinical Pharmacist 06/21/2020 3:56 PM

## 2020-06-21 NOTE — Progress Notes (Deleted)
Physical Therapy Treatment Patient Details Name: Mark [name] MRN: [id number] DOB: 1951-03-11 Today's Date: 06/21/2020    History of Present Illness 69 yo male with onset of fever, PN back pain and increased work of breathing was diagnosed with COPD exacerbation, leukocytosis, and emphysema was noted to have necrotizing PNA.  PMHx:  asthma, COPD, acute respiratory failure, osteoporosis, 4L O2 continually avoiding falling at home    PT Comments    Pt is up to take a couple sidestpes.  Follow acutely to increase safety and quality of mobility, focusing on balance skills, endurance of standing and recovery of sequence with gait.  Pt is demonstrating generally weak LE's with decreased sats with effort, and distracted behavior to focus on the tasks.  He is expecting to get home with family support, and was not walking longer trips at home previously.  However, his PLOF was to use a SPC, which makes him likely to demonstrate a faster recovery.    Follow Up Recommendations  Home health PT;Supervision/Assistance - 24 hour     Equipment Recommendations  Rolling walker with 5" wheels    Recommendations for Other Services Rehab consult     Precautions / Restrictions Precautions Precautions: Fall Precaution Comments: monitor use of O2 Restrictions Weight Bearing Restrictions: Yes    Mobility  Bed Mobility Overal bed mobility: Needs Assistance Bed Mobility: Supine to Sit     Supine to sit: Min assist        Transfers Overall transfer level: Needs assistance               General transfer comment: Pt requires reminders every trial  Ambulation/Gait Ambulation/Gait assistance: Min assist Gait Distance (Feet): 3 Feet Assistive device: Rolling walker (2 wheeled);1 person hand held assist Gait Pattern/deviations: Step-to pattern;Decreased step length - right;Decreased step length - left;Decreased dorsiflexion - right;Decreased dorsiflexion - left         Stairs              Wheelchair Mobility    Modified Rankin (Stroke Patients Only)       Balance Overall balance assessment: Needs assistance Sitting-balance support: Feet supported Sitting balance-Leahy Scale: Fair     Standing balance support: Bilateral upper extremity supported;During functional activity Standing balance-Leahy Scale: Fair                              Cognition Arousal/Alertness: Awake/alert Behavior During Therapy: WFL for tasks assessed/performed Overall Cognitive Status: No family/caregiver present to determine baseline cognitive functioning                                 General Comments: confused at times      Exercises      General Comments General comments (skin integrity, edema, etc.): Pt is asleep when Pt arrives and woke      Pertinent Vitals/Pain Pain Assessment: No/denies pain    Home Living Family/patient expects to be discharged to:: Private residence Living Arrangements: Alone Available Help at Discharge: Family;Available 24 hours/day Type of Home: House Home Access: Ramped entrance   Home Layout: One level Home Equipment: Cane - single point      Prior Function Level of Independence: Independent with assistive device(s)      Comments: pt could not stand at bedside   PT Goals (current goals can now be found in the care plan section) Acute Rehab PT Goals  PT Goal Formulation: With patient Time For Goal Achievement: 08/01/20 Potential to Achieve Goals: Good    Frequency    Min 2X/week      PT Plan      Co-evaluation              AM-PAC PT "6 Clicks" Mobility   Outcome Measure  Help needed turning from your back to your side while in a flat bed without using bedrails?: A Little Help needed moving from lying on your back to sitting on the side of a flat bed without using bedrails?: A Little Help needed moving to and from a bed to a chair (including a wheelchair)?: A Little Help needed standing  up from a chair using your arms (e.g., wheelchair or bedside chair)?: A Little Help needed to walk in hospital room?: A Little Help needed climbing 3-5 steps with a railing? : A Little 6 Click Score: 18    End of Session   Activity Tolerance: Patient limited by fatigue;Treatment limited secondary to medical complications (Comment) Patient left: in bed;with call bell/phone within reach;with bed alarm set;with restraints reapplied Nurse Communication: Mobility status;Other (comment) (asked  for meds for ) PT Visit Diagnosis: Unsteadiness on feet (R26.81);Repeated falls (R29.6);Muscle weakness (generalized) (M62.81);History of falling (Z91.81)     Time: 7711-6579 PT Time Calculation (min) (ACUTE ONLY): 33 min  Charges:  $Therapeutic Activity: 8-22 mins                   Ramond Dial 06/21/2020, 11:40 PM   Mee Hives, PT MS Acute Rehab Dept. Number: [place]

## 2020-06-21 NOTE — Progress Notes (Addendum)
PROGRESS NOTE    Mark [name]  VOJ:[id number] DOB: May 02, 1951 DOA: 06/18/2020 PCP: Mark Cockayne, MD    Brief Narrative:  [name] a 69 y.o.malewith medical history significant forsevere COPD and chronic respiratory failure with hypoxia on 4 L supplemental O2 via [place] chronically, hypothyroidism, and anxiety on chronic benzodiazepines who presents to the ED for evaluation of progressive shortness of breath.  Patient states he has been feeling unwell all summer long. He says he has been intolerant to the heat and pre much confined to his apartment. He has been getting progressively worsening shortness of breath and dyspnea on exertion. At baseline he has a chronic cough usually productive of clear sputum however the last 2 weeks he has had purulent yellow malodorous sputum production. He reports subjective fevers, chills, diaphoresis. He is a former smoker and states he quit at the beginning of the COVID-19 pandemic. He reports a history of right-sided lung abscess in 2009. He says that he has become progressively weak and has difficulty getting out of bed recently to even go to the bathroom.  ED Course: Initial vitals showed BP 153/90, pulse 120, RR 22, temp 98.5 Fahrenheit, SPO2 99% initially on nonrebreather. He was weaned down to his home 4 L supplemental O2 via [place] maintaining O2 saturations >88%.  Labs showed WBC 22.9, hemoglobin 14.0, platelets 474,000, sodium 134, potassium 3.4, bicarb 28, BUN 20, creatinine 0.69, serum glucose 121, BNP 238.2, high-sensitivity troponin I 26. VBG showed pH 7.4, PCO2 57, PO2 119. Lactic acid 1.1.  SARS-CoV-2 PCR is negative. Blood cultures pending.  Portable chest x-ray showed consolidative opacity in the left midlung with cavitary changes.  CT chest with contrast was obtained and showed chronic severe emphysema with superimposed left multilobar pneumonia and findings suggestive of necrotizing pneumonia with multiple areas  suspicious for cavitation and suspected lung abscess lateral to the left hilum.  Patient was given IV vancomycin and cefepime, IV Solu-Medrol 125 mg, 1 L LR, and DuoNeb treatment.     Consultants: ,   Psychiatry, pulmonology, palliative, TOC, ID  Procedures:   Antimicrobials:   IV vancomycin, metronidazole, ceftriaxone-9/17  IV Unasyn-discontinue   Subjective: Pt was in deep sleep , did not wake up for me with stimulation  Objective: Vitals:   06/20/20 1012 06/20/20 1553 06/20/20 2337 06/21/20 0755  BP:  135/81 127/68 (!) 150/74  Pulse:  (!) 109 (!) 101 (!) 110  Resp:  20 16 18   Temp:  98.6 F (37 C) 97.7 F (36.5 C) 98.6 F (37 C)  TempSrc:  Oral Oral Oral  SpO2: 91% 98% 94% 95%  Weight:      Height:        Intake/Output Summary (Last 24 hours) at 06/21/2020 0904 Last data filed at 06/21/2020 0641 Gross per 24 hour  Intake --  Output 2150 ml  Net -2150 ml   Filed Weights   06/18/20 2006  Weight: 81.6 kg    Examination:  Calm, sleeping deeply, snoring, nad Anteriorly cta no r/w/r RRR s1/s2 no  Murmur Soft benign nt/nd +bs Mild bilateral edema Neuro exam unable to perform    Data Reviewed: I have personally reviewed following labs and imaging studies  CBC: Recent Labs  Lab 06/18/20 2014 06/19/20 0506 06/20/20 0445  WBC 22.9* 20.3* 21.6*  HGB 14.0 13.5 13.0  HCT 41.7 40.9 39.1  MCV 87.6 87.2 89.7  PLT 474* 488* 993*   Basic Metabolic Panel: Recent Labs  Lab 06/18/20 2014 06/19/20 0506 06/20/20 0445  NA  134* 135 136  K 3.4* 3.9 3.9  CL 90* 91* 92*  CO2 28 31 33*  GLUCOSE 121* 156* 110*  BUN 20 20 32*  CREATININE 0.69 0.72 0.83  CALCIUM 8.3* 8.4* 8.7*   GFR: Estimated Creatinine Clearance: 92.2 mL/min (by C-G formula based on SCr of 0.83 mg/dL). Liver Function Tests: No results for input(s): AST, ALT, ALKPHOS, BILITOT, PROT, ALBUMIN in the last 168 hours. No results for input(s): LIPASE, AMYLASE in the last 168 hours. No  results for input(s): AMMONIA in the last 168 hours. Coagulation Profile: No results for input(s): INR, PROTIME in the last 168 hours. Cardiac Enzymes: No results for input(s): CKTOTAL, CKMB, CKMBINDEX, TROPONINI in the last 168 hours. BNP (last 3 results) No results for input(s): PROBNP in the last 8760 hours. HbA1C: No results for input(s): HGBA1C in the last 72 hours. CBG: No results for input(s): GLUCAP in the last 168 hours. Lipid Profile: No results for input(s): CHOL, HDL, LDLCALC, TRIG, CHOLHDL, LDLDIRECT in the last 72 hours. Thyroid Function Tests: Recent Labs    06/19/20 0506  TSH 0.459   Anemia Panel: No results for input(s): VITAMINB12, FOLATE, FERRITIN, TIBC, IRON, RETICCTPCT in the last 72 hours. Sepsis Labs: Recent Labs  Lab 06/18/20 2054 06/19/20 0506 06/20/20 0445 06/21/20 0400  PROCALCITON  --  0.81 0.57 0.43  LATICACIDVEN 1.1  --   --   --     Recent Results (from the past 240 hour(s))  SARS Coronavirus 2 by RT PCR (hospital order, performed in [facility] hospital lab) Nasopharyngeal Nasopharyngeal Swab     Status: None   Collection Time: 06/18/20  8:53 PM   Specimen: Nasopharyngeal Swab  Result Value Ref Range Status   SARS Coronavirus 2 NEGATIVE NEGATIVE Final    Comment: (NOTE) SARS-CoV-2 target nucleic acids are NOT DETECTED.  The SARS-CoV-2 RNA is generally detectable in upper and lower respiratory specimens during the acute phase of infection. The lowest concentration of SARS-CoV-2 viral copies this assay can detect is 250 copies / mL. A negative result does not preclude SARS-CoV-2 infection and should not be used as the sole basis for treatment or other patient management decisions.  A negative result may occur with improper specimen collection / handling, submission of specimen other than nasopharyngeal swab, presence of viral mutation(s) within the areas targeted by this assay, and inadequate number of viral copies (<250 copies / mL). A  negative result must be combined with clinical observations, patient history, and epidemiological information.  Fact Sheet for Patients:   StrictlyIdeas.no  Fact Sheet for Healthcare Providers: BankingDealers.co.za  This test is not yet approved or  cleared by the [place] FDA and has been authorized for detection and/or diagnosis of SARS-CoV-2 by FDA under an Emergency Use Authorization (EUA).  This EUA will remain in effect (meaning this test can be used) for the duration of the COVID-19 declaration under Section 564(b)(1) of the Act, 21 U.S.C. section 360bbb-3(b)(1), unless the authorization is terminated or revoked sooner.  Performed at [facility], [address] [place], [place] [address]   Culture, blood (routine x 2)     Status: None (Preliminary result)   Collection Time: 06/18/20  8:54 PM   Specimen: BLOOD  Result Value Ref Range Status   Specimen Description BLOOD RIGHT FA  Final   Special Requests   Final    BOTTLES DRAWN AEROBIC AND ANAEROBIC Blood Culture adequate volume   Culture   Final    NO GROWTH  3 DAYS Performed at [facility], [address] [place], [place] [address]    Report Status PENDING  Incomplete  Culture, blood (routine x 2)     Status: None (Preliminary result)   Collection Time: 06/18/20  8:54 PM   Specimen: BLOOD  Result Value Ref Range Status   Specimen Description BLOOD LEFT FA  Final   Special Requests   Final    BOTTLES DRAWN AEROBIC AND ANAEROBIC Blood Culture adequate volume   Culture   Final    NO GROWTH 3 DAYS Performed at [facility], [address], [place], [place] [address]    Report Status PENDING  Incomplete  Culture, sputum-assessment     Status: None   Collection Time: 06/19/20  7:28 AM   Specimen: Sputum  Result Value Ref Range Status   Specimen Description SPUTUM  Final   Special Requests NONE  Final   Sputum evaluation   Final     THIS SPECIMEN IS ACCEPTABLE FOR SPUTUM CULTURE Performed at [facility], [address] [place], [place] [address]    Report Status 06/19/2020 FINAL  Final  Culture, respiratory     Status: None (Preliminary result)   Collection Time: 06/19/20  7:28 AM   Specimen: SPU  Result Value Ref Range Status   Specimen Description   Final    SPUTUM Performed at [facility], [address], [place], [place] [address]    Special Requests   Final    NONE Reflexed from [id number] Performed at [facility], [address] [place], [place] [address]    Gram Stain   Final    MODERATE WBC PRESENT,BOTH PMN AND MONONUCLEAR MODERATE GRAM POSITIVE COCCI FEW GRAM VARIABLE ROD RARE YEAST    Culture   Final    CULTURE REINCUBATED FOR BETTER GROWTH Performed at [facility], [place] [address], [place], [place] [address]    Report Status PENDING  Incomplete         Radiology Studies: No results found.      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  Seems chronic based on patient reported history 9/19-initially on IV Unasyn switched to vancomycin, Flagyl, Rocephin per IDs  recommendation, will continue  Strep pneumo urinary antigen negative  Check AFB sputum pending  Pulmonology following -poor prognosis, palliative input was appreciated please refer to note  Psychiatry team patient has capacity to consent , will need close follow-up with psychiatry as outpatient    Severe COPD and chronic hypoxic respiratory failure requiring 4 L oxygen via nasal cannula chronically Continue supplemental oxygen, currently appears to be at baseline  Anxiety  On chronic benzodiazepine  Continue Klonopin 1 mg p.o. twice daily, this was last filled on 05/29/2020 by PCP office  Psychiatry was consulted input appreciated deemed patient have capacity for consent  Will need psychiatric close follow-up as outpatient     Hypokalemia - POA Was repleted, stable, will check periodically    Onychomycosis & PVD in LEs Podiatry following -status post bedside nail debridement  Podiatry does not believe patient has cellulitis right now as it appears to be more related to microvascular peripheral vascular disease.   Can consider vascular evaluation if needed but currently doubt any intervention needed  Podiatry recommended nursing staff.  Patient and clean feet with normal sterile saline.  Patient may also use moisturizer on feet daily     Goals of care -  Palliative consulted due to poor prognosis please refer to note     DVT prophylaxis: Lovenox Code Status: Full Family Communication: None at bedside  Status is: Inpatient  Remains inpatient appropriate because:IV treatments appropriate due to intensity of illness or inability to take PO   Dispo: The patient is from: Home              Anticipated d/c is to: TBD              Anticipated d/c date is: > 3 days              Patient currently is not medically stable to d/c.            LOS: 3 days   Time spent: 35 min with >50 % on coc    Nolberto Hanlon, MD [facility] Pager [phone]  If  7PM-7AM, please contact night-coverage [email or web address] Password [facility] 06/21/2020, 9:04 AM

## 2020-06-21 NOTE — Progress Notes (Signed)
Pt refusing Lovenox. MD notified.

## 2020-06-22 DIAGNOSIS — J449 Chronic obstructive pulmonary disease, unspecified: Secondary | ICD-10-CM

## 2020-06-22 DIAGNOSIS — J189 Pneumonia, unspecified organism: Secondary | ICD-10-CM | POA: Diagnosis not present

## 2020-06-22 DIAGNOSIS — J851 Abscess of lung with pneumonia: Secondary | ICD-10-CM | POA: Diagnosis not present

## 2020-06-22 LAB — CBC
HCT: 41.2 % (ref 39.0–52.0)
Hemoglobin: 13.5 g/dL (ref 13.0–17.0)
MCH: 29.5 pg (ref 26.0–34.0)
MCHC: 32.8 g/dL (ref 30.0–36.0)
MCV: 90 fL (ref 80.0–100.0)
Platelets: 430 10*3/uL — ABNORMAL HIGH (ref 150–400)
RBC: 4.58 MIL/uL (ref 4.22–5.81)
RDW: 14.1 % (ref 11.5–15.5)
WBC: 22.2 10*3/uL — ABNORMAL HIGH (ref 4.0–10.5)
nRBC: 0 % (ref 0.0–0.2)

## 2020-06-22 LAB — VANCOMYCIN, TROUGH: Vancomycin Tr: 14 ug/mL — ABNORMAL LOW (ref 15–20)

## 2020-06-22 LAB — BASIC METABOLIC PANEL
Anion gap: 10 (ref 5–15)
BUN: 12 mg/dL (ref 8–23)
CO2: 33 mmol/L — ABNORMAL HIGH (ref 22–32)
Calcium: 8.1 mg/dL — ABNORMAL LOW (ref 8.9–10.3)
Chloride: 95 mmol/L — ABNORMAL LOW (ref 98–111)
Creatinine, Ser: 0.6 mg/dL — ABNORMAL LOW (ref 0.61–1.24)
GFR calc Af Amer: >60 mL/min (ref >60)
GFR calc non Af Amer: >60 mL/min (ref >60)
Glucose, Bld: 121 mg/dL — ABNORMAL HIGH (ref 70–99)
Potassium: 3.2 mmol/L — ABNORMAL LOW (ref 3.5–5.1)
Sodium: 138 mmol/L (ref 135–145)

## 2020-06-22 LAB — SURGICAL PCR SCREEN
MRSA, PCR: NEGATIVE
Staphylococcus aureus: NEGATIVE

## 2020-06-22 MED ORDER — FLUTICASONE PROPIONATE 50 MCG/ACT NA SUSP
2.0000 | Freq: Every day | NASAL | Status: DC | PRN
  Filled 2020-06-22: qty 16

## 2020-06-22 MED ORDER — ASPIRIN EC 81 MG PO TBEC
81.0000 mg | DELAYED_RELEASE_TABLET | Freq: Every day | ORAL | Status: DC
  Administered 2020-06-22 – 2020-07-09 (×17): 81 mg via ORAL
  Filled 2020-06-22 (×17): qty 1

## 2020-06-22 MED ORDER — IPRATROPIUM BROMIDE HFA 17 MCG/ACT IN AERS
2.0000 | INHALATION_SPRAY | Freq: Four times a day (QID) | RESPIRATORY_TRACT | Status: DC
  Filled 2020-06-22 (×2): qty 12.9

## 2020-06-22 MED ORDER — TIOTROPIUM BROMIDE MONOHYDRATE 18 MCG IN CAPS
18.0000 ug | ORAL_CAPSULE | Freq: Every day | RESPIRATORY_TRACT | Status: DC
  Administered 2020-06-22 – 2020-06-25 (×4): 18 ug via RESPIRATORY_TRACT
  Filled 2020-06-22: qty 5

## 2020-06-22 MED ORDER — IPRATROPIUM BROMIDE HFA 17 MCG/ACT IN AERS
2.0000 | INHALATION_SPRAY | RESPIRATORY_TRACT | Status: DC
  Administered 2020-06-23 – 2020-06-24 (×8): 2 via RESPIRATORY_TRACT
  Filled 2020-06-22: qty 12.9

## 2020-06-22 MED ORDER — POTASSIUM CHLORIDE CRYS ER 20 MEQ PO TBCR
40.0000 meq | EXTENDED_RELEASE_TABLET | Freq: Once | ORAL | Status: AC
  Administered 2020-06-22: 40 meq via ORAL
  Filled 2020-06-22: qty 2

## 2020-06-22 MED ORDER — POLYETHYLENE GLYCOL 3350 17 G PO PACK
17.0000 g | PACK | Freq: Every day | ORAL | Status: DC | PRN
  Administered 2020-06-22: 17 g via ORAL
  Filled 2020-06-22: qty 1

## 2020-06-22 MED ORDER — SENNOSIDES-DOCUSATE SODIUM 8.6-50 MG PO TABS
1.0000 | ORAL_TABLET | Freq: Two times a day (BID) | ORAL | Status: DC
  Administered 2020-06-22 – 2020-07-08 (×16): 1 via ORAL
  Filled 2020-06-22 (×30): qty 1

## 2020-06-22 MED ORDER — FLUTICASONE FUROATE-VILANTEROL 200-25 MCG/INH IN AEPB
1.0000 | INHALATION_SPRAY | Freq: Every day | RESPIRATORY_TRACT | Status: DC
  Administered 2020-06-22 – 2020-07-08 (×17): 1 via RESPIRATORY_TRACT
  Filled 2020-06-22: qty 28

## 2020-06-22 MED ORDER — SODIUM CHLORIDE 0.9 % IV SOLN
3.0000 g | Freq: Four times a day (QID) | INTRAVENOUS | Status: AC
  Administered 2020-06-22 – 2020-06-30 (×28): 3 g via INTRAVENOUS
  Filled 2020-06-22: qty 3
  Filled 2020-06-22 (×2): qty 8
  Filled 2020-06-22: qty 3
  Filled 2020-06-22: qty 8
  Filled 2020-06-22 (×3): qty 3
  Filled 2020-06-22: qty 8
  Filled 2020-06-22: qty 3
  Filled 2020-06-22: qty 8
  Filled 2020-06-22 (×10): qty 3
  Filled 2020-06-22: qty 8
  Filled 2020-06-22: qty 3
  Filled 2020-06-22 (×3): qty 8
  Filled 2020-06-22 (×2): qty 3
  Filled 2020-06-22: qty 8
  Filled 2020-06-22 (×2): qty 3
  Filled 2020-06-22 (×2): qty 8

## 2020-06-22 NOTE — Progress Notes (Signed)
ID Pt says he has more energy since admission Coughing up brown sputum-   Patient Vitals for the past 24 hrs:  BP Temp Temp src Pulse Resp SpO2  06/22/20 1609 128/69 98.2 F (36.8 C) Oral (!) 106 18 95 %  06/22/20 0759 135/73 99.8 F (37.7 C) Oral 100 17 93 %  06/21/20 2345 130/73 99.1 F (37.3 C) Oral (!) 102 17 92 %    Awake and alert Chest b/l air entry Crepts, rhonchi left side Hss1 S2 Abd soft  CNS non focal  Labs CBC Latest Ref Rng & Units 06/22/2020 06/20/2020 06/19/2020  WBC 4.0 - 10.5 K/uL 22.2(H) 21.6(H) 20.3(H)  Hemoglobin 13.0 - 17.0 g/dL 13.5 13.0 13.5  Hematocrit 39 - 52 % 41.2 39.1 40.9  Platelets 150 - 400 K/uL 430(H) 511(H) 488(H)    CMP Latest Ref Rng & Units 06/22/2020 06/20/2020 06/19/2020  Glucose 70 - 99 mg/dL 121(H) 110(H) 156(H)  BUN 8 - 23 mg/dL 12 32(H) 20  Creatinine 0.61 - 1.24 mg/dL 0.60(L) 0.83 0.72  Sodium 135 - 145 mmol/L 138 136 135  Potassium 3.5 - 5.1 mmol/L 3.2(L) 3.9 3.9  Chloride 98 - 111 mmol/L 95(L) 92(L) 91(L)  CO2 22 - 32 mmol/L 33(H) 33(H) 31  Calcium 8.9 - 10.3 mg/dL 8.1(L) 8.7(L) 8.4(L)  Total Protein 6.5 - 8.1 g/dL - - -  Total Bilirubin 0.3 - 1.2 mg/dL - - -  Alkaline Phos 38 - 126 U/L - - -  AST 15 - 41 U/L - - -  ALT 0 - 44 U/L - - -    Sputum culture neg Blood culture neg   Impression/recommendation  Left lung necrotizing pneumonia with lung abscess- chronic- on ceftriaxone, flagyl and vanco Sputum culture neg- so will DC vanco and change the other two to unasyn  COPD  Discussed the management with the patient

## 2020-06-22 NOTE — Progress Notes (Signed)
Physical Therapy Treatment Patient Details Name: Mark [name] MRN: [id number] DOB: 30-May-1951 Today's Date: 06/22/2020    History of Present Illness Gideon Barish is a 69 yo male with onset of fever, PN back pain and increased work of breathing was diagnosed with COPD exacerbation, leukocytosis, and emphysema was noted to have necrotizing PNA.  PMHx:  asthma, COPD, acute respiratory failure, osteoporosis, 4L O2 continually avoiding falling at home    PT Comments    Pt in bed upon entyr, agreeable to participate. Pt on 4L at entry, maintained during session, only slight desat to 88% after AMB bout to doorway. HR elevates to 120s with AMB, otherwise remains in 100-110s bpm. Pt limited by ischaemic muscle burn in bilat quads with exercise, also has some intermittent charlie-horse action going on which resolves with rest intervals. Pt has no frank LOB in session, but does ask for minGuard assist durign mobility due to fear of falling. Pt monitors his own vitals in session with his home pulse oximeter. Pt left up to chair at end of session, coffee in hand. NA made aware of need for updated linen.    Follow Up Recommendations  Home health PT;Supervision/Assistance - 24 hour     Equipment Recommendations  Rolling walker with 5" wheels (If mobility does not improve, may benefit from rollator or power WC)    Recommendations for Other Services Rehab consult     Precautions / Restrictions Precautions Precautions: Fall Precaution Comments: monitor use of O2 Restrictions Weight Bearing Restrictions: No    Mobility  Bed Mobility Overal bed mobility: Needs Assistance Bed Mobility: Supine to Sit     Supine to sit: Mod assist     General bed mobility comments: BUE hand held assist  Transfers Overall transfer level: Needs assistance Equipment used: Rolling walker (2 wheeled)             General transfer comment: 7x in session, uses RW; does best from elevated EOB, but able to  rise from chair with heavy BUE pushoff  Ambulation/Gait Ambulation/Gait assistance: Min guard Gait Distance (Feet): 12 Feet Assistive device: Rolling walker (2 wheeled) Gait Pattern/deviations: WFL(Within Functional Limits)     General Gait Details: Fairly slow, chair follow to doorway, 4LO2.   Stairs             Wheelchair Mobility    Modified Rankin (Stroke Patients Only)       Balance Overall balance assessment: Modified Independent;Mild deficits observed, not formally tested                                          Cognition Arousal/Alertness: Awake/alert Behavior During Therapy: WFL for tasks assessed/performed Overall Cognitive Status: No family/caregiver present to determine baseline cognitive functioning                                        Exercises Other Exercises Other Exercises: STS from EOB x5, lengthy sitting/stnading breaks between efforts, VSS on 4L    General Comments        Pertinent Vitals/Pain Pain Assessment: No/denies pain (some leg burning and/or charlie horse in legs with standing/mobility)    Home Living                      Prior Function  PT Goals (current goals can now be found in the care plan section) Acute Rehab PT Goals PT Goal Formulation: With patient Time For Goal Achievement: 08/01/20 Potential to Achieve Goals: Good Progress towards PT goals: Progressing toward goals    Frequency    Min 2X/week      PT Plan Current plan remains appropriate    Co-evaluation              AM-PAC PT "6 Clicks" Mobility   Outcome Measure  Help needed turning from your back to your side while in a flat bed without using bedrails?: A Little Help needed moving from lying on your back to sitting on the side of a flat bed without using bedrails?: A Little Help needed moving to and from a bed to a chair (including a wheelchair)?: A Little Help needed standing up from a  chair using your arms (e.g., wheelchair or bedside chair)?: A Little Help needed to walk in hospital room?: A Little Help needed climbing 3-5 steps with a railing? : Total 6 Click Score: 16    End of Session Equipment Utilized During Treatment: Gait belt;Oxygen Activity Tolerance: Patient limited by fatigue;Patient tolerated treatment well;Patient limited by pain Patient left: with call bell/phone within reach;in chair;with chair alarm set Nurse Communication: Mobility status PT Visit Diagnosis: Unsteadiness on feet (R26.81);Repeated falls (R29.6);Muscle weakness (generalized) (M62.81);History of falling (Z91.81)     Time: 1520-1550 PT Time Calculation (min) (ACUTE ONLY): 30 min  Charges:  $Therapeutic Exercise: 23-37 mins                     4:09 PM, 06/22/20 [name], PT, DPT Physical Therapist - [facility]  [phone] ([place])    Ruthella Kirchman C 06/22/2020, 4:06 PM

## 2020-06-22 NOTE — Progress Notes (Addendum)
Daily Progress Note   Patient Name: Mark [name]       Date: 06/22/2020 DOB: November 26, 1950  Age: 69 y.o. MRN#: [id number] Attending Physician: Nolberto Hanlon, MD Primary Care Physician: Sandra Cockayne, MD Admit Date: 06/18/2020  Reason for Consultation/Follow-up: Establishing goals of care  Per psychiatry evaluation, patient has capacity for decision making.   Subjective: Patient is resting in bed sleeping. He awoke to speak with me. He is conversive today. He states he is afraid of dying a painful "suffucating death." [place].  He states he understands everyone must die at some point. He tells me there are components he is not receiving from his home pulmonary regimen that he would like to include, and if he feels the same after a few days, he would want to discuss comfort based care. Questions answered thoroughly today. He discusses the care his mother received with hospice before she died.    He is clear he would not want chest compressions, shocks, or a breathing tube if his heart or breathing stops. He states "I don't want CPR, we need to do a DNR. If I'm dead, let me be, I don't want to do it again." He states he would not want to be placed on a ventilator if his breathing were to necessitate one to keep him alive. He is clear he would not want a feeding tube if one were ever indicated. Pulmonology in to bedside as patient was signing MOST form. Discussion and plan was reviewed.    Unable to reach sister as phone went straight to VM.   I completed a MOST form today after thou rough discussion and review, and the signed original was placed in the chart. A photocopy was placed in the chart to be scanned into EMR. The patient  outlined their wishes for the following treatment decisions:  Cardiopulmonary Resuscitation: Do Not Attempt Resuscitation (DNR/No CPR)  Medical Interventions: Limited Additional Interventions: Use medical treatment, IV fluids and cardiac monitoring as indicated, DO NOT USE intubation or mechanical ventilation. May consider use of less invasive airway support such as BiPAP or CPAP. Also provide comfort measures. Transfer to the hospital if indicated. Avoid intensive care.   Antibiotics: Antibiotics if indicated  IV Fluids: IV fluids  if indicated  Feeding Tube: No feeding tube    Length of Stay: 4  Current Medications: Scheduled Meds:  . clonazePAM  1 mg Oral BID  . enoxaparin (LOVENOX) injection  40 mg Subcutaneous Q24H  . fluticasone furoate-vilanterol  1 puff Inhalation Daily   And  . umeclidinium bromide  1 puff Inhalation Daily  . guaiFENesin  1,200 mg Oral BID  . ketorolac  30 mg Intravenous Once  . levothyroxine  25 mcg Oral QAC breakfast  . senna-docusate  1 tablet Oral BID    Continuous Infusions: . cefTRIAXone (ROCEPHIN)  IV 2 g (06/21/20 1754)  . metronidazole 500 mg (06/22/20 1218)  . vancomycin 1,000 mg (06/22/20 0859)    PRN Meds: albuterol, chlorpheniramine-HYDROcodone, polyethylene glycol  Physical Exam Pulmonary:     Effort: Pulmonary effort is normal.  Neurological:     Mental Status: He is alert.             Vital Signs: BP 135/73 (BP Location: Right Arm)   Pulse 100   Temp 99.8 F (37.7 C) (Oral)   Resp 17   Ht 6' (1.829 m)   Wt 81.6 kg   SpO2 93%   BMI 24.41 kg/m  SpO2: SpO2: 93 % O2 Device: O2 Device: Nasal Cannula O2 Flow Rate: O2 Flow Rate (L/min): 4 L/min  Intake/output summary:   Intake/Output Summary (Last 24 hours) at 06/22/2020 1351 Last data filed at 06/22/2020 1218 Gross per 24 hour  Intake 0 ml  Output 1840 ml  Net -1840 ml   LBM: Last BM Date: 06/19/20 Baseline Weight: Weight: 81.6 kg Most recent weight: Weight: 81.6  kg       Palliative Assessment/Data:      Patient Active Problem List   Diagnosis Date Noted  . Abscess of left lung with pneumonia ([place]) 06/19/2020  . Necrotizing pneumonia ([place]) 06/18/2020  . Chest pain 01/17/2019  . Hypothyroidism 01/17/2019  . Hypokalemia 01/10/2017  . Elevated C-reactive protein (CRP) 01/10/2017  . Elevated sed rate 01/10/2017  . Chronic hip pain (Location of Tertiary source of pain) (Bilateral) (L>R) 01/10/2017  . Chronic pain syndrome 10/24/2016  . Chronic back pain (Location of Primary Source of Pain) (Bilateral) (R>L) 10/24/2016  . Disturbance of skin sensation 05/05/2016  . Peripheral neuropathy 05/05/2016  . Chronic knee pain (Bilateral) (L>R) 05/05/2016  . Chronic shoulder pain (Bilateral) (R>L) 05/05/2016  . Opioid-induced constipation (OIC) 05/05/2016  . Restless leg syndrome 05/05/2016  . Mediastinal adenopathy 05/05/2016  . Pulmonary nodule (7 mm right lower lobe) 05/05/2016  . Inguinal hernia without obstruction (Right) 05/05/2016  . 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 Spasm of back muscles 05/05/2016  . Chronic musculoskeletal pain 05/05/2016  . Neurogenic pain 05/05/2016  . Neuropathic pain 05/05/2016  . Sacral back pain (Location of Secondary source of pain) (Bilateral) (L>R) 05/05/2016  . Logorrhea 05/05/2016  . Chronic lower extremity pain (Bilateral) (L>R) 05/05/2016  . Chronic neck pain (posterior) (L>R) 05/05/2016  . 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 Cervical facet hypertrophy (C2-3) (Bilateral) (L>R) 05/05/2016  . Cervical facet syndrome (Bilateral) (L>R) 05/05/2016  . 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) 05/05/2016  . Compression fracture of L2 ([place]) (seen on 11/25/2014 x-ray) 05/05/2016  . Anemia, chronic disease 05/05/2016  . Lumbar facet syndrome (Bilateral) (R>L) 05/05/2016  . Opiate use (75 MME/Day)  03/14/2016  . Long term prescription opiate use 03/14/2016  . Long term  current use of opiate analgesic 03/14/2016  . Long term prescription benzodiazepine use 03/14/2016  . History of marijuana use 03/14/2016  . Compression fractures (L4, L3, L1, T12 and T11) 06/26/2015  . Acquired atrophy of thyroid 06/26/2015  . OP (osteoporosis) 06/26/2015  . Other long term (current) drug therapy 07/12/2013  . Pain medication agreement broken 07/12/2013  . Clinical depression 01/20/2005  . Esophagitis, reflux 06/01/2004  . Current tobacco use 06/01/2004  . Anxiety 05/27/2004  . 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 Would like all home pulmonology therapy, and would consider comfort focused care if he does not feel better in a few days following this.    Will continue to follow.   Code Status:    Code Status Orders  (From admission, onward)         Start     Ordered   06/18/20 2325  Full code  Continuous        06/18/20 2325        Code Status History    Date Active Date Inactive Code Status Order ID Comments User Context   01/18/2019 0010 01/18/2019 2118 Full Code [id number]  Lance Coon, MD Inpatient   05/01/2016 0934 05/03/2016 1455 Full Code [id number]  Harrie Foreman, MD Inpatient   Advance Care Planning Activity       Prognosis:   < 6 months  Discharge Planning:  To Be Determined  Care plan was discussed with Pulmonology. Message sent to primary team in epic chat.  Thank you for allowing the Palliative Medicine Team to assist in the care of this patient.   Time In: 12:55 Time Out: 1:45 Total Time 50 min Prolonged Time Billed  no      Greater than 50%  of this time was spent counseling and coordinating care related to the above assessment and plan.  Asencion Gowda, NP  Please contact Palliative Medicine Team phone at [phone] for questions  and concerns.

## 2020-06-22 NOTE — Progress Notes (Signed)
PT Cancellation Note  Patient Details Name: Mark [name] MRN: [id number] DOB: May 18, 1951   Cancelled Treatment:    Reason Eval/Treat Not Completed: Patient declined, no reason specified (Chart reviewed, treatment attempted. Pt refuses treatment at this time, reports later today would be better.)  10:20 AM, 06/22/20 [name], PT, DPT Physical Therapist - [facility]  [phone] ([place])    [name] 06/22/2020, 10:20 AM

## 2020-06-22 NOTE — Care Management Important Message (Signed)
Important Message  Patient Details  Name: Mark [name] MRN: [id number] Date of Birth: 11-15-1950   Medicare Important Message Given:  Yes     Dannette Barbara 06/22/2020, 10:54 AM

## 2020-06-22 NOTE — Progress Notes (Signed)
Pt was seen for evaluation of therapy needs, and was fairly distracted during the session.  Has been home with family and will anticipate his PLOF with SPC gait will make him a good candidate to recover  Mobility and return home.  He is getting up to side of bed with sidesteps only, but will progress gait and balance skills as tolerated.  He is motivated to get home, and with focused education to attend to the tasks of therapy goals, he should make improvements in LE strength, ROM and lost balance control that will help him to return home.  RW is expected to be needed along with home therapy for completion of recovery.   06/21/20 2300  PT Visit Information  Last PT Received On 06/21/20  Assistance Needed +1  History of Present Illness 69 yo male with onset of fever, PN back pain and increased work of breathing was diagnosed with COPD exacerbation, leukocytosis, and emphysema was noted to have necrotizing PNA.  PMHx:  asthma, COPD, acute respiratory failure, osteoporosis, 4L O2 continually avoiding falling at home  Precautions  Precautions Fall  Precaution Comments monitor use of O2  Restrictions  Weight Bearing Restrictions Yes  Pain Assessment  Pain Assessment No/denies pain  Cognition  Arousal/Alertness Awake/alert  Behavior During Therapy WFL for tasks assessed/performed  Overall Cognitive Status No family/caregiver present to determine baseline cognitive functioning  General Comments confused at times  Bed Mobility  Overal bed mobility Needs Assistance  Bed Mobility Supine to Sit  Supine to sit Min assist  Transfers  Overall transfer level Needs assistance  General transfer comment Pt requires reminders every trial  Ambulation/Gait  Ambulation/Gait assistance Min assist  Gait Distance (Feet) 3 Feet  Assistive device Rolling walker (2 wheeled);1 person hand held assist  Gait Pattern/deviations Step-to pattern;Decreased step length - right;Decreased step length - left;Decreased  dorsiflexion - right;Decreased dorsiflexion - left  Balance  Overall balance assessment Needs assistance  Sitting-balance support Feet supported  Sitting balance-Leahy Scale Fair  Standing balance support Bilateral upper extremity supported;During functional activity  Standing balance-Leahy Scale Fair  General Comments  General comments (skin integrity, edema, etc.) Pt is asleep when Pt arrives and woke  Exercises  Exercises Other exercises  PT - End of Session  Activity Tolerance Patient limited by fatigue;Treatment limited secondary to medical complications (Comment)  Patient left in bed;with call bell/phone within reach;with bed alarm set;with restraints reapplied  Nurse Communication Mobility status;Other (comment) (asked  for meds for )   PT - Assessment/Plan  PT Visit Diagnosis Unsteadiness on feet (R26.81);Repeated falls (R29.6);Muscle weakness (generalized) (M62.81);History of falling (Z91.81)  PT Frequency (ACUTE ONLY) Min 2X/week  Recommendations for Other Services Rehab consult  Follow Up Recommendations Home health PT;Supervision/Assistance - 24 hour  PT equipment Rolling walker with 5" wheels  AM-PAC PT "6 Clicks" Mobility Outcome Measure (Version 2)  Help needed turning from your back to your side while in a flat bed without using bedrails? 3  Help needed moving from lying on your back to sitting on the side of a flat bed without using bedrails? 3  Help needed moving to and from a bed to a chair (including a wheelchair)? 3  Help needed standing up from a chair using your arms (e.g., wheelchair or bedside chair)? 3  Help needed to walk in hospital room? 3  Help needed climbing 3-5 steps with a railing?  3  6 Click Score 18  Consider Recommendation of Discharge To: Home with [facility]  Acute Rehab  PT Goals  PT Goal Formulation With patient  Time For Goal Achievement 08/01/20  Potential to Achieve Goals Good  PT Time Calculation  PT Start Time (ACUTE ONLY) 1613  PT Stop Time  (ACUTE ONLY) 1646  PT Time Calculation (min) (ACUTE ONLY) 33 min  PT General Charges  $$ ACUTE PT VISIT 1 Visit  PT Evaluation  $PT Eval Moderate Complexity 1 Mod  PT Treatments  $Therapeutic Activity 8-22 mins   Mee Hives, PT MS Acute Rehab Dept. Number: [place] [facility] [place]

## 2020-06-22 NOTE — Consult Note (Addendum)
Pharmacy Antibiotic Note  Mark [name] is a 69 y.o. male admitted on 06/18/2020 with lung abscess - left lung necrotizing pneumonia with cavitation chronic- mid lung - very likely aspiration. Patient presented with shortness of breath and purulent sputum. PMH includes COPD and chronic respiratory failure on 4L O2, osteoporosis, and asthma. Pharmacy has been consulted for Unasyn dosing.  ID and pulm following No MRSA PCR result available  Plan: Unasyn 3g Q6H - will start at 2000 (based on the last dose of metronidazole)   Height: 6' (182.9 cm) Weight: 81.6 kg (180 lb) IBW/kg (Calculated) : 77.6  Temp (24hrs), Avg:99.1 F (37.3 C), Min:98.4 F (36.9 C), Max:99.8 F (37.7 C)  Recent Labs  Lab 06/18/20 2014 06/18/20 2054 06/19/20 0506 06/20/20 0445 06/21/20 2325 06/22/20 0604  WBC 22.9*  --  20.3* 21.6*  --  22.2*  CREATININE 0.69  --  0.72 0.83  --  0.60*  LATICACIDVEN  --  1.1  --   --   --   --   VANCOTROUGH  --   --   --   --  14*  --     Estimated Creatinine Clearance: 95.7 mL/min (A) (by C-G formula based on SCr of 0.6 mg/dL (L)).    No Known Allergies  Antimicrobials this admission: 9/16 vancomycin >> 9/20 9/17 metfronidazole >> 9/20 9/17 ceftriaxone >> 9/19 9/16 cefepime x 1  9/17 Unasyn x 1 9/20 Unasyn >>   Microbiology results: 9/16 BCx: NGTD 9/17 Sputum: pending 9/17 MRSA PCR: pending  Thank you for allowing pharmacy to be a part of this patient's care.  Kristeen Miss, PharmD Clinical Pharmacist 06/22/2020 3:33 PM

## 2020-06-22 NOTE — Progress Notes (Signed)
Pulmonary Medicine          Date: 06/22/2020,   MRN# [id number] Mark [name] June 28, 1951     AdmissionWeight: 81.6 kg                 CurrentWeight: 81.6 kg   Referring physician: Dr. Manuella Ghazi   CHIEF COMPLAINT:   Necrotizing pneumonia   HISTORY OF PRESENT ILLNESS   Is a pleasant 69 year old male with a history of chronic pain syndrome with opioid-induced constipation, GERD, advanced COPD chronic hypoxemia on 4 L/min supplemental oxygen home O2.  Also has a background history of hypothyroidism, peripheral neuropathy chronic lumbago, reports worsening respiratory status x2 to 3 months with signs and symptoms of acute COPD exacerbation with increased volume and darkening of phlegm on expectoration.  Most recently in the last 2 weeks prior to admission he developed chills diaphoresis and subjective fevers.  In the ER he was found to have leukocytosis, negative COVID-19 test x2, increased O2 requirement from 4 to 5 L/min nasal cannula however venous blood gas was essentially normal BNP and troponin also within reference range.  CT chest shows necrotizing pneumonia with consolidative infiltrate of the left upper and lower lung zones as well as fluid level cystic lesion suggestive of abscess with sorrounding emphysema.  Pulmonary consultation placed for further evaluation and management of necrotizing pneumonia.  He has crusted swollen erythematous feet with intertrigo, he has distended abdomen, poor dentition, and reports broken back. He shares that he has no family and when asked regarding medical proxy he states "im on my own".  Overall he looks chronically ill.    06/20/20- patient had a good night sleep and now is more lucid, hes drinking coffee.  He is speaking rapidly with cicumferential and tangential language. WBC count is with mild trend up.   06/22/20- patient is DNR/DNI after speaking to palliative today - appreciate collaboration. Sputum with GPCs. Patient requests MDI  inhaler we discussed use of Spiriva and Symbicort.  Mild hypokalemia this am, pharmacy consult for repletion. Repeat CXR in am.    PAST MEDICAL HISTORY   Past Medical History:  Diagnosis Date  . Acute respiratory failure with hypoxia and hypercapnia ([place]) 05/01/2016  . Asthma   . COPD (chronic obstructive pulmonary disease) ([place])   . Osteoporosis      SURGICAL HISTORY   Past Surgical History:  Procedure Laterality Date  . BACK SURGERY       FAMILY HISTORY   Family History  Problem Relation Age of Onset  . Pulmonary fibrosis Mother      SOCIAL HISTORY   Social History   Tobacco Use  . Smoking status: Former Research scientist (life sciences)  . Smokeless tobacco: Never Used  Substance Use Topics  . Alcohol use: No  . Drug use: No     MEDICATIONS    Home Medication:    Current Medication:  Current Facility-Administered Medications:  .  albuterol (PROVENTIL) (2.5 MG/3ML) 0.083% nebulizer solution 3 mL, 3 mL, Inhalation, Q4H PRN, Lenore Cordia, MD, 3 mL at 06/21/20 0759 .  cefTRIAXone (ROCEPHIN) 2 g in sodium chloride 0.9 % 100 mL IVPB, 2 g, Intravenous, Q24H, Ravishankar, Jayashree, MD, Last Rate: 200 mL/hr at 06/21/20 1754, 2 g at 06/21/20 1754 .  chlorpheniramine-HYDROcodone (TUSSIONEX) 10-8 MG/5ML suspension 5 mL, 5 mL, Oral, Q4H PRN, Max Sane, MD, 5 mL at 06/19/20 1630 .  clonazePAM (KLONOPIN) tablet 1 mg, 1 mg, Oral, BID, Zada Finders R, MD, 1 mg at 06/22/20 0856 .  enoxaparin (LOVENOX) injection 40 mg, 40 mg, Subcutaneous, Q24H, Zada Finders R, MD, 40 mg at 06/22/20 0856 .  fluticasone furoate-vilanterol (BREO ELLIPTA) 100-25 MCG/INH 1 puff, 1 puff, Inhalation, Daily, 1 puff at 06/22/20 0856 **AND** umeclidinium bromide (INCRUSE ELLIPTA) 62.5 MCG/INH 1 puff, 1 puff, Inhalation, Daily, Lu Duffel, RPH, 1 puff at 06/22/20 0857 .  guaiFENesin (MUCINEX) 12 hr tablet 1,200 mg, 1,200 mg, Oral, BID, Zada Finders R, MD, 1,200 mg at 06/22/20 0855 .  ketorolac (TORADOL) 30  MG/ML injection 30 mg, 30 mg, Intravenous, Once, Sharion Settler, NP .  levothyroxine (SYNTHROID) tablet 25 mcg, 25 mcg, Oral, QAC breakfast, Lenore Cordia, MD, 25 mcg at 06/22/20 0604 .  metroNIDAZOLE (FLAGYL) IVPB 500 mg, 500 mg, Intravenous, Q8H, Ravishankar, Jayashree, MD, Last Rate: 100 mL/hr at 06/22/20 1218, 500 mg at 06/22/20 1218 .  polyethylene glycol (MIRALAX / GLYCOLAX) packet 17 g, 17 g, Oral, Daily PRN, Nolberto Hanlon, MD .  senna-docusate (Senokot-S) tablet 1 tablet, 1 tablet, Oral, BID, Kurtis Bushman, Sahar, MD .  vancomycin (VANCOCIN) IVPB 1000 mg/200 mL premix, 1,000 mg, Intravenous, Q8H, Benn Moulder, RPH, Last Rate: 200 mL/hr at 06/22/20 0859, 1,000 mg at 06/22/20 0859    ALLERGIES   Patient has no known allergies.     REVIEW OF SYSTEMS    Review of Systems:  Gen:  Denies  fever, sweats, chills weigh loss  HEENT: Denies blurred vision, double vision, ear pain, eye pain, hearing loss, nose bleeds, sore throat Cardiac:  No dizziness, chest pain or heaviness, chest tightness,edema Resp:   Denies cough or sputum porduction, shortness of breath,wheezing, hemoptysis,  Gi: Denies swallowing difficulty, stomach pain, nausea or vomiting, diarrhea, constipation, bowel incontinence Gu:  Denies bladder incontinence, burning urine Ext:   Denies Joint pain, stiffness or swelling Skin: Denies  skin rash, easy bruising or bleeding or hives Endoc:  Denies polyuria, polydipsia , polyphagia or weight change Psych:   Denies depression, insomnia or hallucinations   Other:  All other systems negative   VS: BP 135/73 (BP Location: Right Arm)   Pulse (!) 108   Temp 99.8 F (37.7 C) (Oral)   Resp 17   Ht 6' (1.829 m)   Wt 81.6 kg   SpO2 93%   BMI 24.41 kg/m      PHYSICAL EXAM    GENERAL:NAD, no fevers, chills, no weakness no fatigue HEAD: Normocephalic, atraumatic.  EYES: Pupils equal, round, reactive to light. Extraocular muscles intact. No scleral icterus.  MOUTH:  Moist mucosal membrane. Dentition intact. No abscess noted.  EAR, NOSE, THROAT: Clear without exudates. No external lesions.  NECK: Supple. No thyromegaly. No nodules. No JVD.  PULMONARY:rhonchi bilaterally  CARDIOVASCULAR: S1 and S2. Regular rate and rhythm. No murmurs, rubs, or gallops. No edema. Pedal pulses 2+ bilaterally.  GASTROINTESTINAL: Soft, nontender, nondistended. No masses. Positive bowel sounds. No hepatosplenomegaly.  MUSCULOSKELETAL: No swelling, clubbing, or edema. Range of motion full in all extremities.  NEUROLOGIC: Cranial nerves II through XII are intact. No gross focal neurological deficits. Sensation intact. Reflexes intact.  SKIN: crusted feet with erythemaouts intertrigo. PSYCHIATRIC: Mood, affect within normal limits. The patient is awake, alert and oriented x 3. Insight, judgment intact.       IMAGING    CT Chest W Contrast  Result Date: 06/18/2020 CLINICAL DATA:  69 year old male with shortness of breath, productive cough, pneumonia. On home oxygen. EXAM: CT CHEST WITH CONTRAST TECHNIQUE: Multidetector CT imaging of the chest was performed during intravenous contrast administration.  CONTRAST:  44mL OMNIPAQUE IOHEXOL 300 MG/ML  SOLN COMPARISON:  Portable chest earlier today.  Chest CT 01/17/2020. FINDINGS: Cardiovascular: No cardiomegaly or pericardial effusion. Calcified coronary artery and aortic atherosclerosis. The central vascular structures of the mediastinum are enhancing and appear to be patent. Mediastinum/Nodes: Mildly increased in reactive appearing mediastinal lymph nodes compared to 2020, up to 14 mm short axis. Some nodes (right paratracheal on series 2, image 47) have not significantly changed. Additionally, there is evidence of a larger 15 mm short axis left AP window node. Lungs/Pleura: Chronic pulmonary hyperinflation. Extensive chronic emphysema. Previous bullous emphysema in the left upper lung where now there is extensive consolidation with numerous  small rounded areas of opacity suspicious for cavitation. There is a prominent fluid level lateral to the left hilum on series 3, image 65 suspicious for lung abscess. Abnormal interstitial thickening and peribronchial nodularity throughout the left lung. No superimposed pneumothorax. Only trace left pleural fluid. The major airways remain patent. Right lung emphysema and markings appear stable since last year. Upper Abdomen: Negative visible liver, gallbladder, spleen, pancreas, adrenal glands, kidneys, and bowel in the upper abdomen. Musculoskeletal: Chronic compression fractures and ankylosis in the spine, with several levels of previous vertebral body augmentation, appears stable since last year. Occasional chronic rib fractures. No acute osseous abnormality identified. No chest wall abnormality. IMPRESSION: 1. 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 Consider necrotizing pneumonia as multiple areas are suspicious for cavitation, and especially a fluid-level lateral to the left hilum (series 2, image 68) strongly suggesting Lung Abscess. 2. Right lung unaffected.  Only trace left pleural fluid. 3. Reactive appearing mediastinal lymph nodes. 4. Calcified coronary artery and Aortic Atherosclerosis (ICD10-I70.0). Emphysema (ICD10-J43.9). Electronically Signed   By: Genevie Ann M.D.   On: 06/18/2020 22:14   DG Chest Portable 1 View  Result Date: 06/18/2020 CLINICAL DATA:  Shortness of breath all summer lung, worsening last week, productive cough, congestion EXAM: PORTABLE CHEST 1 VIEW COMPARISON:  CT 01/17/2019, radiograph 01/17/2019 FINDINGS: There is a background chronic reticular and fibrotic interstitial changes throughout both lungs albeit with new area of superimposed consolidative opacity spanning the left mid lung to apex including an area of potential cavitation seen more peripherally. No discernible pneumothorax or visible effusion. Cardiomediastinal contours are partially  obscured by overlying opacity with the visible borders similar in appearance to the comparison studies from prior. No acute osseous or soft tissue abnormality. Degenerative changes are present in the imaged spine and shoulders. Telemetry leads overlie the chest. IMPRESSION: Consolidative opacity in the left mid lung to apex with some questionable cavitation peripherally. Recommend further characterization with CT of the chest with contrast if patient is able to tolerate. Findings superimposed on chronic reticular and fibrotic interstitial changes. These results were called by telephone at the time of interpretation on 06/18/2020 at 8:45 pm to provider [facility] , who verbally acknowledged these results. Electronically Signed   By: Lovena Le M.D.   On: 06/18/2020 20:45      ASSESSMENT/PLAN   Left lung necrortizing pneumonia with abscess -patient with poor dentition possible anaerobic etiology -will send off sputum cultures patient states he can cough phlegm -he is altered with encephalopathy -sputum culture x 3->>>> +GPCs  -AFB sputum-negatve -histo/strep pneumo/legionella urine ag -blood cultures-neg to date -MRSA nasal PCR -procalcitonin trend -defer antimicrobial regimen to ID - currently on Unasyn>>rocephin +flagyl +vancomycin -poor prognosis - palliative evaluation    Advanced COPD with centrilobular emphysema  - chronic hypoxemia with increased  O2 requirement  -his emphysema is beyond repair and patient has very poor prognosis -typical COPD carepath with duoneb, bronchopulmonary hygiene, steroids and antibiotics is appropriate for inpatient therapy   Altered mental status with confusion   - possible septic encephalitis, patient admits to drinking alcohol  - VBG does not support hypercarbic encephalopathy   Lower extermity foot wounds  - wound care  And podiatry evaluation - possible  Cellulitis/intertrigo      Thank you for allowing me to participate in the care of  this patient.   Patient/Family are satisfied with care plan and all questions have been answered.  This document was prepared using Dragon voice recognition software and may include unintentional dictation errors.     Ottie Glazier, M.D.  Division of [place]

## 2020-06-22 NOTE — Progress Notes (Signed)
PROGRESS NOTE    Mark [name]  PFX:[id number] DOB: [birth year] DOA: 06/18/2020 PCP: Mark Cockayne, MD    Brief Narrative:  [name] a 69 y.o.malewith medical history significant forsevere COPD and chronic respiratory failure with hypoxia on 4 L supplemental O2 via [place] chronically, hypothyroidism, and anxiety on chronic benzodiazepines who presents to the ED for evaluation of progressive shortness of breath.  Patient states he has been feeling unwell all summer long. He says he has been intolerant to the heat and pre much confined to his apartment. He has been getting progressively worsening shortness of breath and dyspnea on exertion. At baseline he has a chronic cough usually productive of clear sputum however the last 2 weeks he has had purulent yellow malodorous sputum production. He reports subjective fevers, chills, diaphoresis. He is a former smoker and states he quit at the beginning of the COVID-19 pandemic. He reports a history of right-sided lung abscess in 2009. He says that he has become progressively weak and has difficulty getting out of bed recently to even go to the bathroom.  ED Course: Initial vitals showed BP 153/90, pulse 120, RR 22, temp 98.5 Fahrenheit, SPO2 99% initially on nonrebreather. He was weaned down to his home 4 L supplemental O2 via [place] maintaining O2 saturations >88%.  Labs showed WBC 22.9, hemoglobin 14.0, platelets 474,000, sodium 134, potassium 3.4, bicarb 28, BUN 20, creatinine 0.69, serum glucose 121, BNP 238.2, high-sensitivity troponin I 26. VBG showed pH 7.4, PCO2 57, PO2 119. Lactic acid 1.1.  SARS-CoV-2 PCR is negative. Blood cultures pending.  Portable chest x-ray showed consolidative opacity in the left midlung with cavitary changes.  CT chest with contrast was obtained and showed chronic severe emphysema with superimposed left multilobar pneumonia and findings suggestive of necrotizing pneumonia with multiple areas  suspicious for cavitation and suspected lung abscess lateral to the left hilum.  Patient was given IV vancomycin and cefepime, IV Solu-Medrol 125 mg, 1 L LR, and DuoNeb treatment.   Consultants: ,   Psychiatry, pulmonology, palliative, TOC, ID  Procedures:   Antimicrobials:   IV vancomycin, metronidazole, ceftriaxone-9/17  IV Unasyn-discontinue    Subjective: Pt is sleeping today. Mumbling something unrelated to my question , but difficult to understand what we are talking about.   Objective: Vitals:   06/21/20 1750 06/21/20 1752 06/21/20 2345 06/22/20 0759  BP: 137/75 126/69 130/73 135/73  Pulse: (!) 105 (!) 103 (!) 102 (!) 108  Resp:   17 17  Temp:   99.1 F (37.3 C) 99.8 F (37.7 C)  TempSrc:   Oral Oral  SpO2:  96% 92% 93%  Weight:      Height:        Intake/Output Summary (Last 24 hours) at 06/22/2020 1333 Last data filed at 06/22/2020 1218 Gross per 24 hour  Intake 0 ml  Output 1840 ml  Net -1840 ml   Filed Weights   06/18/20 2006  Weight: 81.6 kg    Examination:  General exam: Appears calm and comfortable , sleeping, mumbles something but unable to understand him.  Respiratory system: Clear to auscultation. Respiratory effort normal. Cardiovascular system: S1 & S2 heard, RRR. No JVD, murmurs, rubs, gallops or clicks.  Gastrointestinal system: Abdomen is nondistended, soft and nontender. Normal bowel sounds heard. Central nervous system: Unable to assess Extremities: Mild lower extremity edema Skin warm dry Psychiatry: difficult to assess    Data Reviewed: I have personally reviewed following labs and imaging studies  CBC: Recent Labs  Lab 06/18/20  2014 06/19/20 0506 06/20/20 0445 06/22/20 0604  WBC 22.9* 20.3* 21.6* 22.2*  HGB 14.0 13.5 13.0 13.5  HCT 41.7 40.9 39.1 41.2  MCV 87.6 87.2 89.7 90.0  PLT 474* 488* 511* 008*   Basic Metabolic Panel: Recent Labs  Lab 06/18/20 2014 06/19/20 0506 06/20/20 0445 06/22/20 0604  NA  134* 135 136 138  K 3.4* 3.9 3.9 3.2*  CL 90* 91* 92* 95*  CO2 28 31 33* 33*  GLUCOSE 121* 156* 110* 121*  BUN 20 20 32* 12  CREATININE 0.69 0.72 0.83 0.60*  CALCIUM 8.3* 8.4* 8.7* 8.1*   GFR: Estimated Creatinine Clearance: 95.7 mL/min (A) (by C-G formula based on SCr of 0.6 mg/dL (L)). Liver Function Tests: No results for input(s): AST, ALT, ALKPHOS, BILITOT, PROT, ALBUMIN in the last 168 hours. No results for input(s): LIPASE, AMYLASE in the last 168 hours. No results for input(s): AMMONIA in the last 168 hours. Coagulation Profile: No results for input(s): INR, PROTIME in the last 168 hours. Cardiac Enzymes: No results for input(s): CKTOTAL, CKMB, CKMBINDEX, TROPONINI in the last 168 hours. BNP (last 3 results) No results for input(s): PROBNP in the last 8760 hours. HbA1C: No results for input(s): HGBA1C in the last 72 hours. CBG: No results for input(s): GLUCAP in the last 168 hours. Lipid Profile: No results for input(s): CHOL, HDL, LDLCALC, TRIG, CHOLHDL, LDLDIRECT in the last 72 hours. Thyroid Function Tests: No results for input(s): TSH, T4TOTAL, FREET4, T3FREE, THYROIDAB in the last 72 hours. Anemia Panel: No results for input(s): VITAMINB12, FOLATE, FERRITIN, TIBC, IRON, RETICCTPCT in the last 72 hours. Sepsis Labs: Recent Labs  Lab 06/18/20 2054 06/19/20 0506 06/20/20 0445 06/21/20 0400  PROCALCITON  --  0.81 0.57 0.43  LATICACIDVEN 1.1  --   --   --     Recent Results (from the past 240 hour(s))  SARS Coronavirus 2 by RT PCR (hospital order, performed in [facility] hospital lab) Nasopharyngeal Nasopharyngeal Swab     Status: None   Collection Time: 06/18/20  8:53 PM   Specimen: Nasopharyngeal Swab  Result Value Ref Range Status   SARS Coronavirus 2 NEGATIVE NEGATIVE Final    Comment: (NOTE) SARS-CoV-2 target nucleic acids are NOT DETECTED.  The SARS-CoV-2 RNA is generally detectable in upper and lower respiratory specimens during the acute phase of  infection. The lowest concentration of SARS-CoV-2 viral copies this assay can detect is 250 copies / mL. A negative result does not preclude SARS-CoV-2 infection and should not be used as the sole basis for treatment or other patient management decisions.  A negative result may occur with improper specimen collection / handling, submission of specimen other than nasopharyngeal swab, presence of viral mutation(s) within the areas targeted by this assay, and inadequate number of viral copies (<250 copies / mL). A negative result must be combined with clinical observations, patient history, and epidemiological information.  Fact Sheet for Patients:   StrictlyIdeas.no  Fact Sheet for Healthcare Providers: BankingDealers.co.za  This test is not yet approved or  cleared by the [place] FDA and has been authorized for detection and/or diagnosis of SARS-CoV-2 by FDA under an Emergency Use Authorization (EUA).  This EUA will remain in effect (meaning this test can be used) for the duration of the COVID-19 declaration under Section 564(b)(1) of the Act, 21 U.S.C. section 360bbb-3(b)(1), unless the authorization is terminated or revoked sooner.  Performed at [facility], [address], [place], [place] [address]   Culture, blood (  routine x 2)     Status: None (Preliminary result)   Collection Time: 06/18/20  8:54 PM   Specimen: BLOOD  Result Value Ref Range Status   Specimen Description BLOOD RIGHT FA  Final   Special Requests   Final    BOTTLES DRAWN AEROBIC AND ANAEROBIC Blood Culture adequate volume   Culture   Final    NO GROWTH 4 DAYS Performed at [facility], [address] [place], [place] [address]    Report Status PENDING  Incomplete  Culture, blood (routine x 2)     Status: None (Preliminary result)   Collection Time: 06/18/20  8:54 PM   Specimen: BLOOD  Result Value Ref Range Status   Specimen  Description BLOOD LEFT FA  Final   Special Requests   Final    BOTTLES DRAWN AEROBIC AND ANAEROBIC Blood Culture adequate volume   Culture   Final    NO GROWTH 4 DAYS Performed at [facility], [address], [place], [place] [address]    Report Status PENDING  Incomplete  Culture, sputum-assessment     Status: None   Collection Time: 06/19/20  7:28 AM   Specimen: Sputum  Result Value Ref Range Status   Specimen Description SPUTUM  Final   Special Requests NONE  Final   Sputum evaluation   Final    THIS SPECIMEN IS ACCEPTABLE FOR SPUTUM CULTURE Performed at [facility], [address] [place], [place] [address]    Report Status 06/19/2020 FINAL  Final  Culture, respiratory     Status: None   Collection Time: 06/19/20  7:28 AM   Specimen: SPU  Result Value Ref Range Status   Specimen Description   Final    SPUTUM Performed at [facility], [address] [place], [place] [address]    Special Requests   Final    NONE Reflexed from [id number] Performed at [facility], [place], [place] [address]    Gram Stain   Final    MODERATE WBC PRESENT,BOTH PMN AND MONONUCLEAR MODERATE GRAM POSITIVE COCCI FEW GRAM VARIABLE ROD RARE YEAST    Culture   Final    FEW Normal respiratory flora-no Staph aureus or Pseudomonas seen Performed at [facility], [place] [address] [place],  [address]    Report Status 06/21/2020 FINAL  Final         Radiology Studies: No results found.      Scheduled Meds: . clonazePAM  1 mg Oral BID  . enoxaparin (LOVENOX) injection  40 mg Subcutaneous Q24H  . fluticasone furoate-vilanterol  1 puff Inhalation Daily   And  . umeclidinium bromide  1 puff Inhalation Daily  . guaiFENesin  1,200 mg Oral BID  . ketorolac  30 mg Intravenous Once  . levothyroxine  25 mcg Oral QAC breakfast   Continuous Infusions: . cefTRIAXone (ROCEPHIN)  IV 2 g (06/21/20 1754)  . metronidazole 500 mg  (06/22/20 1218)  . vancomycin 1,000 mg (06/22/20 0859)    Assessment & Plan:   Principal Problem:   Necrotizing pneumonia ([place]) Active Problems:   Anxiety   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 ([facility])   Hypokalemia   Hypothyroidism   Abscess of left lung with pneumonia ([place])   Necrotizing pneumonia with lung abscess Present on admission, seen on CT chest. Seems chronic based on patient reported history 9/19-initially on IV Unasyn switched to vancomycin, Flagyl, Rocephin per IDs recommendation, will continue  Leukocytosis slowly increasing Strep pneumo urinary antigen negative  9/20-AFP pending Pulmonary following-poor prognosis, I have asked palliative care to come by and see patient today Psychiatry input-patient has capacity to consent, will need close follow-up with psychiatry as outpatient     Severe COPD and chronic hypoxic respiratory failure requiring 4 L oxygen via nasal cannula chronically C continue supplemental oxygen, currently appears at baseline    Constipation-Per nursing last bowel 3 days ago We will add Colace, senna, and MiraLAX to his bowel regimen   Anxiety On chronic benzodiazepine  Continue Klonopin 1 mg twice daily, this was last filled on 05/29/2020 by PCP office  Psychiatry was consulted input appreciated did not patient have capacity for consent, but needs outpatient follow-up closely.      Hypokalemia-POA Has been repleted, K is 3.2. Will give KCl 40 mEq x 1  monitor levels    Onychomycosis & PVD in LEs Podiatry following -status post bedside nail debridement  Podiatry does not believe patient has cellulitis right now as it appears to be more related to microvascular peripheral vascular disease.   Can consider vascular evaluation if needed but currently doubt any intervention needed  Podiatry recommended nursing staff.  Patient and clean feet with normal sterile saline.  Patient may also use  moisturizer on feet daily     Goals of care-  Palliative consulted due to poor px. Please see initial consult Have asked him to see pt today again.   PT eval  DVT prophylaxis: Lovenox, refused this a.m. per nursing Code Status: Full Family Communication: None at bedside Status is: Inpatient  Remains inpatient appropriate because:IV treatments appropriate due to intensity of illness or inability to take PO   Dispo: The patient is from: Home              Anticipated d/c is to: Home              Anticipated d/c date is: 3 days              Patient currently is not medically stable to d/c.            LOS: 4 days   Time spent:45 minutes with >50% on coc    Nolberto Hanlon, MD [facility] Pager [phone]  If 7PM-7AM, please contact night-coverage [email or web address] Password [email or web address] 06/22/2020, 1:33 PM

## 2020-06-23 ENCOUNTER — Inpatient Hospital Stay: Payer: Medicare Other

## 2020-06-23 LAB — COMPREHENSIVE METABOLIC PANEL
ALT: 12 U/L (ref 0–44)
AST: 13 U/L — ABNORMAL LOW (ref 15–41)
Albumin: 2.1 g/dL — ABNORMAL LOW (ref 3.5–5.0)
Alkaline Phosphatase: 97 U/L (ref 38–126)
Anion gap: 8 (ref 5–15)
BUN: 13 mg/dL (ref 8–23)
CO2: 34 mmol/L — ABNORMAL HIGH (ref 22–32)
Calcium: 8 mg/dL — ABNORMAL LOW (ref 8.9–10.3)
Chloride: 93 mmol/L — ABNORMAL LOW (ref 98–111)
Creatinine, Ser: 0.67 mg/dL (ref 0.61–1.24)
GFR calc Af Amer: >60 mL/min (ref >60)
GFR calc non Af Amer: >60 mL/min (ref >60)
Glucose, Bld: 142 mg/dL — ABNORMAL HIGH (ref 70–99)
Potassium: 3.6 mmol/L (ref 3.5–5.1)
Sodium: 135 mmol/L (ref 135–145)
Total Bilirubin: 0.7 mg/dL (ref 0.3–1.2)
Total Protein: 5.7 g/dL — ABNORMAL LOW (ref 6.5–8.1)

## 2020-06-23 LAB — CBC WITH DIFFERENTIAL/PLATELET
Abs Immature Granulocytes: 0.72 10*3/uL — ABNORMAL HIGH (ref 0.00–0.07)
Basophils Absolute: 0.1 10*3/uL (ref 0.0–0.1)
Basophils Relative: 0 %
Eosinophils Absolute: 0.2 10*3/uL (ref 0.0–0.5)
Eosinophils Relative: 1 %
HCT: 42.1 % (ref 39.0–52.0)
Hemoglobin: 13.4 g/dL (ref 13.0–17.0)
Immature Granulocytes: 3 %
Lymphocytes Relative: 5 %
Lymphs Abs: 1.1 10*3/uL (ref 0.7–4.0)
MCH: 29 pg (ref 26.0–34.0)
MCHC: 31.8 g/dL (ref 30.0–36.0)
MCV: 91.1 fL (ref 80.0–100.0)
Monocytes Absolute: 0.8 10*3/uL (ref 0.1–1.0)
Monocytes Relative: 4 %
Neutro Abs: 18.9 10*3/uL — ABNORMAL HIGH (ref 1.7–7.7)
Neutrophils Relative %: 87 %
Platelets: 483 10*3/uL — ABNORMAL HIGH (ref 150–400)
RBC: 4.62 MIL/uL (ref 4.22–5.81)
RDW: 14.3 % (ref 11.5–15.5)
WBC: 21.7 10*3/uL — ABNORMAL HIGH (ref 4.0–10.5)
nRBC: 0 % (ref 0.0–0.2)

## 2020-06-23 LAB — BASIC METABOLIC PANEL
Anion gap: 10 (ref 5–15)
BUN: 11 mg/dL (ref 8–23)
CO2: 34 mmol/L — ABNORMAL HIGH (ref 22–32)
Calcium: 8.1 mg/dL — ABNORMAL LOW (ref 8.9–10.3)
Chloride: 93 mmol/L — ABNORMAL LOW (ref 98–111)
Creatinine, Ser: 0.71 mg/dL (ref 0.61–1.24)
GFR calc Af Amer: >60 mL/min (ref >60)
GFR calc non Af Amer: >60 mL/min (ref >60)
Glucose, Bld: 133 mg/dL — ABNORMAL HIGH (ref 70–99)
Potassium: 3.4 mmol/L — ABNORMAL LOW (ref 3.5–5.1)
Sodium: 137 mmol/L (ref 135–145)

## 2020-06-23 LAB — CULTURE, BLOOD (ROUTINE X 2)
Culture: NO GROWTH
Culture: NO GROWTH
Special Requests: ADEQUATE
Special Requests: ADEQUATE

## 2020-06-23 LAB — PROCALCITONIN: Procalcitonin: 0.3 ng/mL

## 2020-06-23 LAB — MAGNESIUM: Magnesium: 2.1 mg/dL (ref 1.7–2.4)

## 2020-06-23 LAB — PHOSPHORUS: Phosphorus: 2.6 mg/dL (ref 2.5–4.6)

## 2020-06-23 MED ORDER — POTASSIUM CHLORIDE CRYS ER 20 MEQ PO TBCR
40.0000 meq | EXTENDED_RELEASE_TABLET | Freq: Once | ORAL | Status: AC
  Administered 2020-06-23: 40 meq via ORAL
  Filled 2020-06-23: qty 2

## 2020-06-23 MED ORDER — CALCIUM CARBONATE ANTACID 500 MG PO CHEW
1.0000 | CHEWABLE_TABLET | Freq: Once | ORAL | Status: AC
  Administered 2020-06-24: 200 mg via ORAL
  Filled 2020-06-23: qty 1

## 2020-06-23 NOTE — Progress Notes (Signed)
Pulmonary Medicine          Date: 06/23/2020,   MRN# [id number] Mark [name] 18-Jun-1951     AdmissionWeight: 81.6 kg                 CurrentWeight: 81.6 kg   Referring physician: Dr. Manuella Ghazi   CHIEF COMPLAINT:   Necrotizing pneumonia   HISTORY OF PRESENT ILLNESS   Is a pleasant 69 year old male with a history of chronic pain syndrome with opioid-induced constipation, GERD, advanced COPD chronic hypoxemia on 4 L/min supplemental oxygen home O2.  Also has a background history of hypothyroidism, peripheral neuropathy chronic lumbago, reports worsening respiratory status x2 to 3 months with signs and symptoms of acute COPD exacerbation with increased volume and darkening of phlegm on expectoration.  Most recently in the last 2 weeks prior to admission he developed chills diaphoresis and subjective fevers.  In the ER he was found to have leukocytosis, negative COVID-19 test x2, increased O2 requirement from 4 to 5 L/min nasal cannula however venous blood gas was essentially normal BNP and troponin also within reference range.  CT chest shows necrotizing pneumonia with consolidative infiltrate of the left upper and lower lung zones as well as fluid level cystic lesion suggestive of abscess with sorrounding emphysema.  Pulmonary consultation placed for further evaluation and management of necrotizing pneumonia.  He has crusted swollen erythematous feet with intertrigo, he has distended abdomen, poor dentition, and reports broken back. He shares that he has no family and when asked regarding medical proxy he states "im on my own".  Overall he looks chronically ill.    06/20/20- patient had a good night sleep and now is more lucid, hes drinking coffee.  He is speaking rapidly with cicumferential and tangential language. WBC count is with mild trend up.   06/22/20- patient is DNR/DNI after speaking to palliative today - appreciate collaboration. Sputum with GPCs. Patient requests MDI  inhaler we discussed use of Spiriva and Symbicort.  Mild hypokalemia this am, pharmacy consult for repletion. Repeat CXR in am.    06/23/20- patient appears clincally improved this morning.  CXR this am reviewed by me - left lung cavitary pneumonia and superimposed combined pulmonary fibrosis and emphysema. Patient is on 4L/min [place]   PAST MEDICAL HISTORY   Past Medical History:  Diagnosis Date  . Acute respiratory failure with hypoxia and hypercapnia ([place]) 05/01/2016  . Asthma   . COPD (chronic obstructive pulmonary disease) ([place])   . Osteoporosis      SURGICAL HISTORY   Past Surgical History:  Procedure Laterality Date  . BACK SURGERY       FAMILY HISTORY   Family History  Problem Relation Age of Onset  . Pulmonary fibrosis Mother      SOCIAL HISTORY   Social History   Tobacco Use  . Smoking status: Former Research scientist (life sciences)  . Smokeless tobacco: Never Used  Substance Use Topics  . Alcohol use: No  . Drug use: No     MEDICATIONS    Home Medication:    Current Medication:  Current Facility-Administered Medications:  .  albuterol (PROVENTIL) (2.5 MG/3ML) 0.083% nebulizer solution 3 mL, 3 mL, Inhalation, Q4H PRN, Lenore Cordia, MD, 3 mL at 06/21/20 0759 .  Ampicillin-Sulbactam (UNASYN) 3 g in sodium chloride 0.9 % 100 mL IVPB, 3 g, Intravenous, Q6H, Duncan, Asajah R, RPH, Last Rate: 200 mL/hr at 06/23/20 0252, 3 g at 06/23/20 0252 .  aspirin EC tablet 81 mg, 81 mg,  Oral, Daily, Nolberto Hanlon, MD, 81 mg at 06/22/20 1504 .  chlorpheniramine-HYDROcodone (TUSSIONEX) 10-8 MG/5ML suspension 5 mL, 5 mL, Oral, Q4H PRN, Max Sane, MD, 5 mL at 06/19/20 1630 .  clonazePAM (KLONOPIN) tablet 1 mg, 1 mg, Oral, BID, Zada Finders R, MD, 1 mg at 06/22/20 2045 .  enoxaparin (LOVENOX) injection 40 mg, 40 mg, Subcutaneous, Q24H, Zada Finders R, MD, 40 mg at 06/22/20 0856 .  fluticasone (FLONASE) 50 MCG/ACT nasal spray 2 spray, 2 spray, Each Nare, Daily PRN, Kurtis Bushman, Sahar, MD .   fluticasone furoate-vilanterol (BREO ELLIPTA) 200-25 MCG/INH 1 puff, 1 puff, Inhalation, Daily, Nolberto Hanlon, MD, 1 puff at 06/22/20 1506 .  guaiFENesin (MUCINEX) 12 hr tablet 1,200 mg, 1,200 mg, Oral, BID, Zada Finders R, MD, 1,200 mg at 06/22/20 2045 .  ipratropium (ATROVENT HFA) inhaler 2 puff, 2 puff, Inhalation, Q4H, Nolberto Hanlon, MD, 2 puff at 06/23/20 0400 .  ketorolac (TORADOL) 30 MG/ML injection 30 mg, 30 mg, Intravenous, Once, Sharion Settler, NP .  levothyroxine (SYNTHROID) tablet 25 mcg, 25 mcg, Oral, QAC breakfast, Lenore Cordia, MD, 25 mcg at 06/23/20 0615 .  polyethylene glycol (MIRALAX / GLYCOLAX) packet 17 g, 17 g, Oral, Daily PRN, Nolberto Hanlon, MD, 17 g at 06/22/20 1353 .  potassium chloride SA (KLOR-CON) CR tablet 40 mEq, 40 mEq, Oral, Once, Nolberto Hanlon, MD .  senna-docusate (Senokot-S) tablet 1 tablet, 1 tablet, Oral, BID, Nolberto Hanlon, MD, 1 tablet at 06/22/20 2045 .  tiotropium (SPIRIVA) inhalation capsule ([facility] use ONLY) 18 mcg, 18 mcg, Inhalation, Daily, Nolberto Hanlon, MD, 18 mcg at 06/22/20 1506 .  [DISCONTINUED] fluticasone furoate-vilanterol (BREO ELLIPTA) 100-25 MCG/INH 1 puff, 1 puff, Inhalation, Daily, 1 puff at 06/22/20 0856 **AND** umeclidinium bromide (INCRUSE ELLIPTA) 62.5 MCG/INH 1 puff, 1 puff, Inhalation, Daily, Lu Duffel, RPH, 1 puff at 06/22/20 4627    ALLERGIES   Patient has no known allergies.     REVIEW OF SYSTEMS    Review of Systems:  Gen:  Denies  fever, sweats, chills weigh loss  HEENT: Denies blurred vision, double vision, ear pain, eye pain, hearing loss, nose bleeds, sore throat Cardiac:  No dizziness, chest pain or heaviness, chest tightness,edema Resp:   Reports SOB Gi: Denies swallowing difficulty, stomach pain, nausea or vomiting, diarrhea, constipation, bowel incontinence Gu:  Denies bladder incontinence, burning urine Ext:   Denies Joint pain, stiffness or swelling Skin: Denies  skin rash, easy bruising or bleeding  or hives Endoc:  Denies polyuria, polydipsia , polyphagia or weight change Psych:   Denies depression, insomnia or hallucinations   Other:  All other systems negative   VS: BP 120/69 (BP Location: Right Arm)   Pulse (!) 106   Temp 98.9 F (37.2 C)   Resp 20   Ht 6' (1.829 m)   Wt 81.6 kg   SpO2 95%   BMI 24.41 kg/m      PHYSICAL EXAM    GENERAL:NAD, no fevers, chills, no weakness no fatigue HEAD: Normocephalic, atraumatic.  EYES: Pupils equal, round, reactive to light. Extraocular muscles intact. No scleral icterus.  MOUTH: Moist mucosal membrane. Dentition intact. No abscess noted.  EAR, NOSE, THROAT: Clear without exudates. No external lesions.  NECK: Supple. No thyromegaly. No nodules. No JVD.  PULMONARY:rhonchi bilaterally  CARDIOVASCULAR: S1 and S2. Regular rate and rhythm. No murmurs, rubs, or gallops. No edema. Pedal pulses 2+ bilaterally.  GASTROINTESTINAL: Soft, nontender, nondistended. No masses. Positive bowel sounds. No hepatosplenomegaly.  MUSCULOSKELETAL: No swelling, clubbing, or  edema. Range of motion full in all extremities.  NEUROLOGIC: Cranial nerves II through XII are intact. No gross focal neurological deficits. Sensation intact. Reflexes intact.  SKIN: crusted feet with erythemaouts intertrigo. PSYCHIATRIC: Mood, affect within normal limits. The patient is awake, alert and oriented x 3. Insight, judgment intact.       IMAGING    CT Chest W Contrast  Result Date: 06/18/2020 CLINICAL DATA:  69 year old male with shortness of breath, productive cough, pneumonia. On home oxygen. EXAM: CT CHEST WITH CONTRAST TECHNIQUE: Multidetector CT imaging of the chest was performed during intravenous contrast administration. CONTRAST:  33mL OMNIPAQUE IOHEXOL 300 MG/ML  SOLN COMPARISON:  Portable chest earlier today.  Chest CT 01/17/2020. FINDINGS: Cardiovascular: No cardiomegaly or pericardial effusion. Calcified coronary artery and aortic atherosclerosis. The  central vascular structures of the mediastinum are enhancing and appear to be patent. Mediastinum/Nodes: Mildly increased in reactive appearing mediastinal lymph nodes compared to 2020, up to 14 mm short axis. Some nodes (right paratracheal on series 2, image 47) have not significantly changed. Additionally, there is evidence of a larger 15 mm short axis left AP window node. Lungs/Pleura: Chronic pulmonary hyperinflation. Extensive chronic emphysema. Previous bullous emphysema in the left upper lung where now there is extensive consolidation with numerous small rounded areas of opacity suspicious for cavitation. There is a prominent fluid level lateral to the left hilum on series 3, image 65 suspicious for lung abscess. Abnormal interstitial thickening and peribronchial nodularity throughout the left lung. No superimposed pneumothorax. Only trace left pleural fluid. The major airways remain patent. Right lung emphysema and markings appear stable since last year. Upper Abdomen: Negative visible liver, gallbladder, spleen, pancreas, adrenal glands, kidneys, and bowel in the upper abdomen. Musculoskeletal: Chronic compression fractures and ankylosis in the spine, with several levels of previous vertebral body augmentation, appears stable since last year. Occasional chronic rib fractures. No acute osseous abnormality identified. No chest wall abnormality. IMPRESSION: 1. 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 Consider necrotizing pneumonia as multiple areas are suspicious for cavitation, and especially a fluid-level lateral to the left hilum (series 2, image 68) strongly suggesting Lung Abscess. 2. Right lung unaffected.  Only trace left pleural fluid. 3. Reactive appearing mediastinal lymph nodes. 4. Calcified coronary artery and Aortic Atherosclerosis (ICD10-I70.0). Emphysema (ICD10-J43.9). Electronically Signed   By: Genevie Ann M.D.   On: 06/18/2020 22:14   DG Chest Portable 1  View  Result Date: 06/18/2020 CLINICAL DATA:  Shortness of breath all summer lung, worsening last week, productive cough, congestion EXAM: PORTABLE CHEST 1 VIEW COMPARISON:  CT 01/17/2019, radiograph 01/17/2019 FINDINGS: There is a background chronic reticular and fibrotic interstitial changes throughout both lungs albeit with new area of superimposed consolidative opacity spanning the left mid lung to apex including an area of potential cavitation seen more peripherally. No discernible pneumothorax or visible effusion. Cardiomediastinal contours are partially obscured by overlying opacity with the visible borders similar in appearance to the comparison studies from prior. No acute osseous or soft tissue abnormality. Degenerative changes are present in the imaged spine and shoulders. Telemetry leads overlie the chest. IMPRESSION: Consolidative opacity in the left mid lung to apex with some questionable cavitation peripherally. Recommend further characterization with CT of the chest with contrast if patient is able to tolerate. Findings superimposed on chronic reticular and fibrotic interstitial changes. These results were called by telephone at the time of interpretation on 06/18/2020 at 8:45 pm to provider [facility] , who verbally acknowledged these results.  Electronically Signed   By: Lovena Le M.D.   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)  - chronic hypoxemia with increased O2 requirement  -his emphysema is beyond repair and patient has very poor prognosis -typical COPD carepath with duoneb, bronchopulmonary hygiene, steroids and antibiotics is appropriate for inpatient therapy   Altered mental status with confusion   - possible septic encephalitis, patient admits to drinking alcohol  - VBG does not support hypercarbic encephalopathy  -patient does not lack capacity and has had psychiatric evaluation    Lower extermity foot wounds  - wound care  And podiatry evaluation - possible  Cellulitis/intertrigo      Thank you for allowing me to participate in the care of this patient.   Patient/Family are satisfied with care plan and all questions have been answered.  This document was prepared using Dragon voice recognition software and may include unintentional dictation errors.     Ottie Glazier, M.D.  Division of [place]

## 2020-06-23 NOTE — Plan of Care (Signed)
°  Problem: Coping: °Goal: Level of anxiety will decrease °Outcome: Progressing °  °

## 2020-06-23 NOTE — Progress Notes (Addendum)
PROGRESS NOTE    Mark [name]  JOA:[id number] DOB: 05-19-1951 DOA: 06/18/2020 PCP: Sandra Cockayne, MD    Brief Narrative:  [name] a 69 y.o.malewith medical history significant forsevere COPD and chronic respiratory failure with hypoxia on 4 L supplemental O2 via [place] chronically, hypothyroidism, and anxiety on chronic benzodiazepines who presents to the ED for evaluation of progressive shortness of breath.  Patient states he has been feeling unwell all summer long. He says he has been intolerant to the heat and pre much confined to his apartment. He has been getting progressively worsening shortness of breath and dyspnea on exertion. At baseline he has a chronic cough usually productive of clear sputum however the last 2 weeks he has had purulent yellow malodorous sputum production. He reports subjective fevers, chills, diaphoresis. He is a former smoker and states he quit at the beginning of the COVID-19 pandemic. He reports a history of right-sided lung abscess in 2009. He says that he has become progressively weak and has difficulty getting out of bed recently to even go to the bathroom.  ED Course: Initial vitals showed BP 153/90, pulse 120, RR 22, temp 98.5 Fahrenheit, SPO2 99% initially on nonrebreather. He was weaned down to his home 4 L supplemental O2 via [place] maintaining O2 saturations >88%.  Labs showed WBC 22.9, hemoglobin 14.0, platelets 474,000, sodium 134, potassium 3.4, bicarb 28, BUN 20, creatinine 0.69, serum glucose 121, BNP 238.2, high-sensitivity troponin I 26. VBG showed pH 7.4, PCO2 57, PO2 119. Lactic acid 1.1.  SARS-CoV-2 PCR is negative. Blood cultures pending.  Portable chest x-ray showed consolidative opacity in the left midlung with cavitary changes.  CT chest with contrast was obtained and showed chronic severe emphysema with superimposed left multilobar pneumonia and findings suggestive of necrotizing pneumonia with multiple areas  suspicious for cavitation and suspected lung abscess lateral to the left hilum.  Patient was given IV vancomycin and cefepime, IV Solu-Medrol 125 mg, 1 L LR, and DuoNeb treatment.   Consultants: ,   Psychiatry, pulmonology, palliative, TOC, ID  Procedures:   Antimicrobials:   IV vancomycin, metronidazole, ceftriaxone-9/17  IV Unasyn-discontinue    Subjective: Pt is more quiet today. States his breathing a little better today. Has no other complaints.  Objective: Vitals:   06/22/20 0759 06/22/20 1609 06/22/20 2335 06/23/20 0816  BP: 135/73 128/69 116/77 120/69  Pulse: 100 (!) 106 (!) 102 (!) 106  Resp: 17 18 18 20   Temp: 99.8 F (37.7 C) 98.2 F (36.8 C) (!) 97.5 F (36.4 C) 98.9 F (37.2 C)  TempSrc: Oral Oral Oral   SpO2: 93% 95% 93% 95%  Weight:      Height:        Intake/Output Summary (Last 24 hours) at 06/23/2020 1524 Last data filed at 06/23/2020 1312 Gross per 24 hour  Intake --  Output 650 ml  Net -650 ml   Filed Weights   06/18/20 2006  Weight: 81.6 kg    Examination: Gen: Soft-spoken today, playing with his phone, calm and comfortable Mild rhonchi bilaterally no wheezing or rales RRR S1-S2 no murmurs rubs gallops Soft nontender nondistended positive bowel sounds Mild LE edema b/l  Awake and oriented Mood and affect in current setting appropriate. Not as talkative as usual    Data Reviewed: I have personally reviewed following labs and imaging studies  CBC: Recent Labs  Lab 06/18/20 2014 06/19/20 0506 06/20/20 0445 06/22/20 0604 06/23/20 0957  WBC 22.9* 20.3* 21.6* 22.2* 21.7*  NEUTROABS  --   --   --   --  18.9*  HGB 14.0 13.5 13.0 13.5 13.4  HCT 41.7 40.9 39.1 41.2 42.1  MCV 87.6 87.2 89.7 90.0 91.1  PLT 474* 488* 511* 430* 833*   Basic Metabolic Panel: Recent Labs  Lab 06/19/20 0506 06/20/20 0445 06/22/20 0604 06/23/20 0441 06/23/20 0957  NA 135 136 138 137 135  K 3.9 3.9 3.2* 3.4* 3.6  CL 91* 92* 95* 93* 93*   CO2 31 33* 33* 34* 34*  GLUCOSE 156* 110* 121* 133* 142*  BUN 20 32* 12 11 13   CREATININE 0.72 0.83 0.60* 0.71 0.67  CALCIUM 8.4* 8.7* 8.1* 8.1* 8.0*  MG  --   --   --   --  2.1  PHOS  --   --   --   --  2.6   GFR: Estimated Creatinine Clearance: 95.7 mL/min (by C-G formula based on SCr of 0.67 mg/dL). Liver Function Tests: Recent Labs  Lab 06/23/20 0957  AST 13*  ALT 12  ALKPHOS 97  BILITOT 0.7  PROT 5.7*  ALBUMIN 2.1*   No results for input(s): LIPASE, AMYLASE in the last 168 hours. No results for input(s): AMMONIA in the last 168 hours. Coagulation Profile: No results for input(s): INR, PROTIME in the last 168 hours. Cardiac Enzymes: No results for input(s): CKTOTAL, CKMB, CKMBINDEX, TROPONINI in the last 168 hours. BNP (last 3 results) No results for input(s): PROBNP in the last 8760 hours. HbA1C: No results for input(s): HGBA1C in the last 72 hours. CBG: No results for input(s): GLUCAP in the last 168 hours. Lipid Profile: No results for input(s): CHOL, HDL, LDLCALC, TRIG, CHOLHDL, LDLDIRECT in the last 72 hours. Thyroid Function Tests: No results for input(s): TSH, T4TOTAL, FREET4, T3FREE, THYROIDAB in the last 72 hours. Anemia Panel: No results for input(s): VITAMINB12, FOLATE, FERRITIN, TIBC, IRON, RETICCTPCT in the last 72 hours. Sepsis Labs: Recent Labs  Lab 06/18/20 2054 06/19/20 0506 06/20/20 0445 06/21/20 0400 06/23/20 0957  PROCALCITON  --  0.81 0.57 0.43 0.30  LATICACIDVEN 1.1  --   --   --   --     Recent Results (from the past 240 hour(s))  SARS Coronavirus 2 by RT PCR (hospital order, performed in [place] hospital lab) Nasopharyngeal Nasopharyngeal Swab     Status: None   Collection Time: 06/18/20  8:53 PM   Specimen: Nasopharyngeal Swab  Result Value Ref Range Status   SARS Coronavirus 2 NEGATIVE NEGATIVE Final    Comment: (NOTE) SARS-CoV-2 target nucleic acids are NOT DETECTED.  The SARS-CoV-2 RNA is generally detectable in upper  and lower respiratory specimens during the acute phase of infection. The lowest concentration of SARS-CoV-2 viral copies this assay can detect is 250 copies / mL. A negative result does not preclude SARS-CoV-2 infection and should not be used as the sole basis for treatment or other patient management decisions.  A negative result may occur with improper specimen collection / handling, submission of specimen other than nasopharyngeal swab, presence of viral mutation(s) within the areas targeted by this assay, and inadequate number of viral copies (<250 copies / mL). A negative result must be combined with clinical observations, patient history, and epidemiological information.  Fact Sheet for Patients:   StrictlyIdeas.no  Fact Sheet for Healthcare Providers: BankingDealers.co.za  This test is not yet approved or  cleared by the [place] FDA and has been authorized for detection and/or diagnosis of SARS-CoV-2 by FDA under an Emergency Use Authorization (EUA).  This EUA will remain in effect (meaning  this test can be used) for the duration of the COVID-19 declaration under Section 564(b)(1) of the Act, 21 U.S.C. section 360bbb-3(b)(1), unless the authorization is terminated or revoked sooner.  Performed at [facility], [address] [place], [place] [address]   Culture, blood (routine x 2)     Status: None   Collection Time: 06/18/20  8:54 PM   Specimen: BLOOD  Result Value Ref Range Status   Specimen Description BLOOD RIGHT FA  Final   Special Requests   Final    BOTTLES DRAWN AEROBIC AND ANAEROBIC Blood Culture adequate volume   Culture   Final    NO GROWTH 5 DAYS Performed at [facility], [address], [place], [place] [address]    Report Status 06/23/2020 FINAL  Final  Culture, blood (routine x 2)     Status: None   Collection Time: 06/18/20  8:54 PM   Specimen: BLOOD  Result Value Ref Range  Status   Specimen Description BLOOD LEFT FA  Final   Special Requests   Final    BOTTLES DRAWN AEROBIC AND ANAEROBIC Blood Culture adequate volume   Culture   Final    NO GROWTH 5 DAYS Performed at [facility], [address] [place], [place] [address]    Report Status 06/23/2020 FINAL  Final  Culture, sputum-assessment     Status: None   Collection Time: 06/19/20  7:28 AM   Specimen: Sputum  Result Value Ref Range Status   Specimen Description SPUTUM  Final   Special Requests NONE  Final   Sputum evaluation   Final    THIS SPECIMEN IS ACCEPTABLE FOR SPUTUM CULTURE Performed at [facility], [address], [place], [place] [address]    Report Status 06/19/2020 FINAL  Final  Culture, respiratory     Status: None   Collection Time: 06/19/20  7:28 AM   Specimen: SPU  Result Value Ref Range Status   Specimen Description   Final    SPUTUM Performed at [facility], [address] [place], [place] [address]    Special Requests   Final    NONE Reflexed from [id number] Performed at [facility], [place], [place] [address]    Gram Stain   Final    MODERATE WBC PRESENT,BOTH PMN AND MONONUCLEAR MODERATE GRAM POSITIVE COCCI FEW GRAM VARIABLE ROD RARE YEAST    Culture   Final    FEW Normal respiratory flora-no Staph aureus or Pseudomonas seen Performed at [facility], [place] [address] [place], [place] [address]    Report Status 06/21/2020 FINAL  Final  Surgical pcr screen     Status: None   Collection Time: 06/22/20  3:22 PM   Specimen: Nasal Mucosa; Nasal Swab  Result Value Ref Range Status   MRSA, PCR NEGATIVE NEGATIVE Final   Staphylococcus aureus NEGATIVE NEGATIVE Final    Comment: (NOTE) The Xpert SA Assay (FDA approved for NASAL specimens in patients 57 years of age and older), is one component of a comprehensive surveillance program. It is not intended to diagnose infection nor to guide or monitor  treatment. Performed at [facility], [address] [place], [name] [address]          Radiology Studies: DG Chest [place] 1 View  Result Date: 06/23/2020 CLINICAL DATA:  Pneumonia EXAM: PORTABLE CHEST 1 VIEW COMPARISON:  June 18, 2020 chest radiograph and chest CT FINDINGS: Extensive airspace opacity is noted throughout  the left upper and left mid lung regions with areas of apparent cavitation. Less discrete infiltrate is noted throughout the remainder of the left lung, stable. There is underlying emphysematous change. Scattered areas of scarring noted on the right without edema or airspace opacity. Heart size and pulmonary vascularity are normal. No adenopathy. No bone lesions. IMPRESSION: Underlying emphysema. Widespread airspace opacity on the left with areas of cavitary pneumonia throughout the left upper and mid lung regions, similar to recent prior studies. Underlying fibrosis elsewhere, most severe left lower lung region. Scattered areas of scarring on the right without edema or airspace opacity. Stable cardiac silhouette. Electronically Signed   By: Lowella Grip III M.D.   On: 06/23/2020 08:37        Scheduled Meds: . aspirin EC  81 mg Oral Daily  . clonazePAM  1 mg Oral BID  . enoxaparin (LOVENOX) injection  40 mg Subcutaneous Q24H  . fluticasone furoate-vilanterol  1 puff Inhalation Daily  . guaiFENesin  1,200 mg Oral BID  . ipratropium  2 puff Inhalation Q4H  . ketorolac  30 mg Intravenous Once  . levothyroxine  25 mcg Oral QAC breakfast  . senna-docusate  1 tablet Oral BID  . tiotropium  18 mcg Inhalation Daily  . umeclidinium bromide  1 puff Inhalation Daily   Continuous Infusions: . 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 Seems chronic based on patient reported history 9/19-initially on IV Unasyn switched to vancomycin, Flagyl, Rocephin per IDs recommendation, will continue   Leukocytosis slowly increasing Strep pneumo urinary antigen negative  9/20-AFP pending 9/21- Reports breathing better.  ID changed abx to [place]. Sputum cx neg Still with leukocytosis Home inhalers restarted AFB negative today Pulmonary following-poor prognosis Psychiatry input-patient has capacity to consent, will need close follow-up with psychiatry as outpatient     Severe COPD and chronic hypoxic respiratory failure requiring 4 L oxygen via nasal cannula chronically Continue supplemental oxygen, currently appears at baseline 4 L    Constipation- On Colace, senna, MiraLAX     Anxiety On chronic benzodiazepine  Continue Klonopin 1 mg twice daily, this was last filled on 05/29/2020 by PCP office  Psychiatry was consulted and input appreciated, felt patient has capacity to consent but needs outpatient follow-up closely     Hypokalemia-POA Repleted, now stable.  K today 3.6 Continue to monitor    Onychomycosis & PVD in LEs Podiatry following -status post bedside nail debridement  Podiatry does not believe patient has cellulitis right now as it appears to be more related to microvascular peripheral vascular disease.   Can consider vascular evaluation if needed but currently doubt any intervention needed  Podiatry recommended nursing staff.  Patient and clean feet with normal sterile saline.  Patient may also use moisturizer on feet daily     Goals of care-  Following due to poor prognosis, patient was made DNR yesterday MOLST in chart  Patient would consider comfort focus if does not feel better in a few days  PT consult  DVT prophylaxis:  Lovenox/SCD Code Status: DNR Family Communication: None at bedside Status is: Inpatient  Remains inpatient appropriate because:IV treatments appropriate due  to intensity of illness or inability to take PO   Dispo: The patient is from: Home              Anticipated d/c is to: Home              Anticipated d/c date is: 3 days              Patient currently is not medically stable to d/c.            LOS: 5 days   Time spent:35 minutes with >50% on coc    Nolberto Hanlon, MD [facility] Pager [phone]  If 7PM-7AM, please contact night-coverage [email or web address] Password [facility] 06/23/2020, 3:24 PM

## 2020-06-23 NOTE — Progress Notes (Signed)
RNCM acknowledges consult, attempted to reach out and speak to patient however he reports right now he has too much going on and requests that I follow back up tomorrow morning.

## 2020-06-23 NOTE — Progress Notes (Addendum)
PT Cancellation Note  Patient Details Name: Mark [name] MRN: [id number] DOB: 1951/09/25   Cancelled Treatment:     PT attempt (1st -4276). Pt refused requesting therapist return at later time. Acute PT will continue to follow per POC and progress as able per pt tolerance.   Returned for second attempt (1455) . Pt on toilet with RN staff in room. Pt states he will be too tired afterwards to participate. Will return at later date.    [name] 06/23/2020, 1:37 PM

## 2020-06-24 DIAGNOSIS — D473 Essential (hemorrhagic) thrombocythemia: Secondary | ICD-10-CM

## 2020-06-24 LAB — PROCALCITONIN: Procalcitonin: 0.26 ng/mL

## 2020-06-24 LAB — HISTOPLASMA GAL'MANNAN AG SER: Histoplasma Gal'mannan Ag Ser: <0.5 (ref <0.5)

## 2020-06-24 MED ORDER — PANTOPRAZOLE SODIUM 40 MG PO TBEC
40.0000 mg | DELAYED_RELEASE_TABLET | Freq: Every day | ORAL | Status: DC
  Administered 2020-06-25 – 2020-07-09 (×13): 40 mg via ORAL
  Filled 2020-06-24 (×15): qty 1

## 2020-06-24 NOTE — TOC Initial Note (Signed)
Transition of Care [facility]) - Initial/Assessment Note    Patient Details  Name: Mark [name] MRN: [id number] Date of Birth: October 09, 1950  [facility]) CM/SW Contact:    Shelbie Ammons, RN Phone Number: 06/24/2020, 3:08 PM  Clinical Narrative:    RNCM met with patient at bedside. Patient reports feeling ok today, doesn't feel he is ready to go home, feels he will be right back if he goes home now. Patient reports he has a couple of walkers and cane in home as well as oxygen through [employer]. Patient reports that he has not had home health in the past but that he would be agreeable to it. Patient does not have preference as to agency and reports he is open to anyone.  RNCM reached out to [facility] to see if he can accept referral.                Expected Discharge Plan: [place] Barriers to Discharge: Continued Medical Work up   Patient Goals and CMS Choice        Expected Discharge Plan and Services Expected Discharge Plan: [place]   Discharge Planning Services: CM Consult Post Acute Care Choice: [place] arrangements for the past 2 months: Single Family Home                           HH Arranged: RN, PT, OT, Nurse's Aide, Social Work [employer] Agency: [place] ([place]) Date [place]: 06/24/20 Time [place]: 10 Representative spoke with at [place]: [place] Arrangements/Services Living arrangements for the past 2 months: [place] with:: Self Patient language and need for interpreter reviewed:: Yes Do you feel safe going back to the place where you live?: Yes      Need for Family Participation in Patient Care: Yes (Comment) Care giver support system in place?: Yes (comment)   Criminal Activity/Legal Involvement Pertinent to Current Situation/Hospitalization: No - Comment as needed  Activities of Daily Living Home Assistive Devices/Equipment: Cane (specify  quad or straight), Walker (specify type) ADL Screening (condition at time of admission) Patient's cognitive ability adequate to safely complete daily activities?: Yes Is the patient deaf or have difficulty hearing?: No Does the patient have difficulty seeing, even when wearing glasses/contacts?: No Does the patient have difficulty concentrating, remembering, or making decisions?: No Patient able to express need for assistance with ADLs?: Yes Does the patient have difficulty dressing or bathing?: No Independently performs ADLs?: Yes (appropriate for developmental age) Does the patient have difficulty walking or climbing stairs?: Yes Weakness of Legs: Both Weakness of Arms/Hands: None  Permission Sought/Granted                  Emotional Assessment Appearance:: Appears stated age Attitude/Demeanor/Rapport: Engaged Affect (typically observed): Appropriate Orientation: : Oriented to Self, Oriented to Place, Oriented to  Time, Oriented to Situation Alcohol / Substance Use: Not Applicable Psych Involvement: No (comment)  Admission diagnosis:  Necrotizing pneumonia ([place]) [J85.0] COPD exacerbation ([place]) [J44.1] Cavitary pneumonia [J18.9, J98.4] Patient Active Problem List   Diagnosis Date Noted  . Abscess of left lung with pneumonia ([place]) 06/19/2020  . Necrotizing pneumonia ([place]) 06/18/2020  . Chest pain 01/17/2019  . Hypothyroidism 01/17/2019  . Hypokalemia 01/10/2017  . Elevated C-reactive protein (CRP) 01/10/2017  . Elevated sed rate 01/10/2017  . Chronic hip pain (Location of Tertiary source of pain) (  Bilateral) (L>R) 01/10/2017  . Chronic pain syndrome 10/24/2016  . Chronic back pain (Location of Primary Source of Pain) (Bilateral) (R>L) 10/24/2016  . Disturbance of skin sensation 05/05/2016  . Peripheral neuropathy 05/05/2016  . Chronic knee pain (Bilateral) (L>R) 05/05/2016  . Chronic shoulder pain (Bilateral) (R>L) 05/05/2016  . Opioid-induced constipation (OIC)  05/05/2016  . Restless leg syndrome 05/05/2016  . Mediastinal adenopathy 05/05/2016  . Pulmonary nodule (7 mm right lower lobe) 05/05/2016  . Inguinal hernia without obstruction (Right) 05/05/2016  . 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 Spasm of back muscles 05/05/2016  . Chronic musculoskeletal pain 05/05/2016  . Neurogenic pain 05/05/2016  . Neuropathic pain 05/05/2016  . Sacral back pain (Location of Secondary source of pain) (Bilateral) (L>R) 05/05/2016  . Logorrhea 05/05/2016  . Chronic lower extremity pain (Bilateral) (L>R) 05/05/2016  . Chronic neck pain (posterior) (L>R) 05/05/2016  . 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 Cervical facet hypertrophy (C2-3) (Bilateral) (L>R) 05/05/2016  . Cervical facet syndrome (Bilateral) (L>R) 05/05/2016  . 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) 05/05/2016  . Compression fracture of L2 ([place]) (seen on 11/25/2014 x-ray) 05/05/2016  . Anemia, chronic disease 05/05/2016  . Lumbar facet syndrome (Bilateral) (R>L) 05/05/2016  . Opiate use (75 MME/Day) 03/14/2016  . Long term prescription opiate use 03/14/2016  . Long term current use of opiate analgesic 03/14/2016  . Long term prescription benzodiazepine use 03/14/2016  . History of marijuana use 03/14/2016  . Compression fractures (L4, L3, L1, T12 and T11) 06/26/2015  . Acquired atrophy of thyroid 06/26/2015  . OP (osteoporosis) 06/26/2015  . Other long term (current) drug therapy 07/12/2013  . Pain medication agreement broken 07/12/2013  . Clinical depression 01/20/2005  . Esophagitis, reflux 06/01/2004  . Current tobacco use 06/01/2004  . Anxiety 05/27/2004  . 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 [place] [address] Phone: [phone] Fax: [phone]     Social Determinants of Health (SDOH) Interventions    Readmission Risk Interventions No flowsheet data found.

## 2020-06-24 NOTE — Progress Notes (Signed)
Pt refused new order for Protonix, he states "that does not help him". MD Jimmye Norman notified.

## 2020-06-24 NOTE — Progress Notes (Signed)
Brief Pharmacy Note  Per chart review, patient has refused Enoxaparin 40 mg Q24H yesterday and today. This pharmacist called Mark [name] and explained the indication for Enoxaparin and the importance of getting the daily injection. Per the patient, "I have thin blood and do not need to get the injection. I know what it's for and understand why you are calling."

## 2020-06-24 NOTE — Progress Notes (Signed)
Pulmonary Medicine          Date: 06/24/2020,   MRN# [id number] Mark [name] 28-Oct-1950     AdmissionWeight: 81.6 kg                 CurrentWeight: 81.6 kg   Referring physician: Dr. Manuella Ghazi   CHIEF COMPLAINT:   Necrotizing pneumonia   HISTORY OF PRESENT ILLNESS   Is a pleasant 69 year old male with a history of chronic pain syndrome with opioid-induced constipation, GERD, advanced COPD chronic hypoxemia on 4 L/min supplemental oxygen home O2.  Also has a background history of hypothyroidism, peripheral neuropathy chronic lumbago, reports worsening respiratory status x2 to 3 months with signs and symptoms of acute COPD exacerbation with increased volume and darkening of phlegm on expectoration.  Most recently in the last 2 weeks prior to admission he developed chills diaphoresis and subjective fevers.  In the ER he was found to have leukocytosis, negative COVID-19 test x2, increased O2 requirement from 4 to 5 L/min nasal cannula however venous blood gas was essentially normal BNP and troponin also within reference range.  CT chest shows necrotizing pneumonia with consolidative infiltrate of the left upper and lower lung zones as well as fluid level cystic lesion suggestive of abscess with sorrounding emphysema.  Pulmonary consultation placed for further evaluation and management of necrotizing pneumonia.  He has crusted swollen erythematous feet with intertrigo, he has distended abdomen, poor dentition, and reports broken back. He shares that he has no family and when asked regarding medical proxy he states "im on my own".  Overall he looks chronically ill.    06/20/20- patient had a good night sleep and now is more lucid, hes drinking coffee.  He is speaking rapidly with cicumferential and tangential language. WBC count is with mild trend up.   06/22/20- patient is DNR/DNI after speaking to palliative today - appreciate collaboration. Sputum with GPCs. Patient requests MDI  inhaler we discussed use of Spiriva and Symbicort.  Mild hypokalemia this am, pharmacy consult for repletion. Repeat CXR in am.    06/23/20- patient appears clincally improved this morning.  CXR this am reviewed by me - left lung cavitary pneumonia and superimposed combined pulmonary fibrosis and emphysema. Patient is on 4L/min [place]  06/24/20-patient is similar as yesterday he complains of post prandial substernal chest discomfort, we discussed potential GERD related pain. He has extensive emphysema and will unlikely be a good candidate for bronchoscopy. We will order spirometry to evaluate candidacy for anesthesia.    PAST MEDICAL HISTORY   Past Medical History:  Diagnosis Date  . Acute respiratory failure with hypoxia and hypercapnia ([place]) 05/01/2016  . Asthma   . COPD (chronic obstructive pulmonary disease) ([place])   . Osteoporosis      SURGICAL HISTORY   Past Surgical History:  Procedure Laterality Date  . BACK SURGERY       FAMILY HISTORY   Family History  Problem Relation Age of Onset  . Pulmonary fibrosis Mother      SOCIAL HISTORY   Social History   Tobacco Use  . Smoking status: Former Research scientist (life sciences)  . Smokeless tobacco: Never Used  Substance Use Topics  . Alcohol use: No  . Drug use: No     MEDICATIONS    Home Medication:    Current Medication:  Current Facility-Administered Medications:  .  albuterol (PROVENTIL) (2.5 MG/3ML) 0.083% nebulizer solution 3 mL, 3 mL, Inhalation, Q4H PRN, Lenore Cordia, MD, 3 mL at 06/21/20 0759 .  Ampicillin-Sulbactam (UNASYN) 3 g in sodium chloride 0.9 % 100 mL IVPB, 3 g, Intravenous, Q6H, Duncan, Asajah R, RPH, Last Rate: 200 mL/hr at 06/24/20 1200, 3 g at 06/24/20 1200 .  aspirin EC tablet 81 mg, 81 mg, Oral, Daily, Nolberto Hanlon, MD, 81 mg at 06/24/20 0846 .  chlorpheniramine-HYDROcodone (TUSSIONEX) 10-8 MG/5ML suspension 5 mL, 5 mL, Oral, Q4H PRN, Max Sane, MD, 5 mL at 06/19/20 1630 .  clonazePAM (KLONOPIN) tablet 1 mg, 1  mg, Oral, BID, Zada Finders R, MD, 1 mg at 06/23/20 2158 .  enoxaparin (LOVENOX) injection 40 mg, 40 mg, Subcutaneous, Q24H, Zada Finders R, MD, 40 mg at 06/22/20 0856 .  fluticasone (FLONASE) 50 MCG/ACT nasal spray 2 spray, 2 spray, Each Nare, Daily PRN, Kurtis Bushman, Sahar, MD .  fluticasone furoate-vilanterol (BREO ELLIPTA) 200-25 MCG/INH 1 puff, 1 puff, Inhalation, Daily, Nolberto Hanlon, MD, 1 puff at 06/24/20 0847 .  guaiFENesin (MUCINEX) 12 hr tablet 1,200 mg, 1,200 mg, Oral, BID, Zada Finders R, MD, 1,200 mg at 06/24/20 0846 .  ketorolac (TORADOL) 30 MG/ML injection 30 mg, 30 mg, Intravenous, Once, Sharion Settler, NP .  levothyroxine (SYNTHROID) tablet 25 mcg, 25 mcg, Oral, QAC breakfast, Lenore Cordia, MD, 25 mcg at 06/24/20 0503 .  pantoprazole (PROTONIX) EC tablet 40 mg, 40 mg, Oral, Daily, Eppie Gibson M, MD .  polyethylene glycol (MIRALAX / GLYCOLAX) packet 17 g, 17 g, Oral, Daily PRN, Nolberto Hanlon, MD, 17 g at 06/22/20 1353 .  senna-docusate (Senokot-S) tablet 1 tablet, 1 tablet, Oral, BID, Nolberto Hanlon, MD, 1 tablet at 06/23/20 0835 .  tiotropium (SPIRIVA) inhalation capsule ([facility] use ONLY) 18 mcg, 18 mcg, Inhalation, Daily, Nolberto Hanlon, MD, 18 mcg at 06/24/20 0845 .  [DISCONTINUED] fluticasone furoate-vilanterol (BREO ELLIPTA) 100-25 MCG/INH 1 puff, 1 puff, Inhalation, Daily, 1 puff at 06/22/20 0856 **AND** umeclidinium bromide (INCRUSE ELLIPTA) 62.5 MCG/INH 1 puff, 1 puff, Inhalation, Daily, Lu Duffel, RPH, 1 puff at 06/24/20 2778    ALLERGIES   Patient has no known allergies.     REVIEW OF SYSTEMS    Review of Systems:  Gen:  Denies  fever, sweats, chills weigh loss  HEENT: Denies blurred vision, double vision, ear pain, eye pain, hearing loss, nose bleeds, sore throat Cardiac:  No dizziness, chest pain or heaviness, chest tightness,edema Resp:   Reports SOB Gi: Denies swallowing difficulty, stomach pain, nausea or vomiting, diarrhea, constipation, bowel  incontinence Gu:  Denies bladder incontinence, burning urine Ext:   Denies Joint pain, stiffness or swelling Skin: Denies  skin rash, easy bruising or bleeding or hives Endoc:  Denies polyuria, polydipsia , polyphagia or weight change Psych:   Denies depression, insomnia or hallucinations   Other:  All other systems negative   VS: BP 118/72 (BP Location: Right Arm)   Pulse (!) 110   Temp 98.6 F (37 C) (Oral)   Resp 17   Ht 6' (1.829 m)   Wt 81.6 kg   SpO2 95%   BMI 24.41 kg/m      PHYSICAL EXAM    GENERAL:NAD, no fevers, chills, no weakness no fatigue HEAD: Normocephalic, atraumatic.  EYES: Pupils equal, round, reactive to light. Extraocular muscles intact. No scleral icterus.  MOUTH: Moist mucosal membrane. Dentition intact. No abscess noted.  EAR, NOSE, THROAT: Clear without exudates. No external lesions.  NECK: Supple. No thyromegaly. No nodules. No JVD.  PULMONARY:rhonchi bilaterally  CARDIOVASCULAR: S1 and S2. Regular rate and rhythm. No murmurs, rubs, or gallops. No edema. Pedal  pulses 2+ bilaterally.  GASTROINTESTINAL: Soft, nontender, nondistended. No masses. Positive bowel sounds. No hepatosplenomegaly.  MUSCULOSKELETAL: No swelling, clubbing, or edema. Range of motion full in all extremities.  NEUROLOGIC: Cranial nerves II through XII are intact. No gross focal neurological deficits. Sensation intact. Reflexes intact.  SKIN: crusted feet with erythemaouts intertrigo. PSYCHIATRIC: Mood, affect within normal limits. The patient is awake, alert and oriented x 3. Insight, judgment intact.       IMAGING    CT Chest W Contrast  Result Date: 06/18/2020 CLINICAL DATA:  69 year old male with shortness of breath, productive cough, pneumonia. On home oxygen. EXAM: CT CHEST WITH CONTRAST TECHNIQUE: Multidetector CT imaging of the chest was performed during intravenous contrast administration. CONTRAST:  35mL OMNIPAQUE IOHEXOL 300 MG/ML  SOLN COMPARISON:  Portable chest  earlier today.  Chest CT 01/17/2020. FINDINGS: Cardiovascular: No cardiomegaly or pericardial effusion. Calcified coronary artery and aortic atherosclerosis. The central vascular structures of the mediastinum are enhancing and appear to be patent. Mediastinum/Nodes: Mildly increased in reactive appearing mediastinal lymph nodes compared to 2020, up to 14 mm short axis. Some nodes (right paratracheal on series 2, image 47) have not significantly changed. Additionally, there is evidence of a larger 15 mm short axis left AP window node. Lungs/Pleura: Chronic pulmonary hyperinflation. Extensive chronic emphysema. Previous bullous emphysema in the left upper lung where now there is extensive consolidation with numerous small rounded areas of opacity suspicious for cavitation. There is a prominent fluid level lateral to the left hilum on series 3, image 65 suspicious for lung abscess. Abnormal interstitial thickening and peribronchial nodularity throughout the left lung. No superimposed pneumothorax. Only trace left pleural fluid. The major airways remain patent. Right lung emphysema and markings appear stable since last year. Upper Abdomen: Negative visible liver, gallbladder, spleen, pancreas, adrenal glands, kidneys, and bowel in the upper abdomen. Musculoskeletal: Chronic compression fractures and ankylosis in the spine, with several levels of previous vertebral body augmentation, appears stable since last year. Occasional chronic rib fractures. No acute osseous abnormality identified. No chest wall abnormality. IMPRESSION: 1. 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 Consider necrotizing pneumonia as multiple areas are suspicious for cavitation, and especially a fluid-level lateral to the left hilum (series 2, image 68) strongly suggesting Lung Abscess. 2. Right lung unaffected.  Only trace left pleural fluid. 3. Reactive appearing mediastinal lymph nodes. 4. Calcified coronary artery and  Aortic Atherosclerosis (ICD10-I70.0). Emphysema (ICD10-J43.9). Electronically Signed   By: Genevie Ann M.D.   On: 06/18/2020 22:14   DG Chest Port 1 View  Result Date: 06/23/2020 CLINICAL DATA:  Pneumonia EXAM: PORTABLE CHEST 1 VIEW COMPARISON:  June 18, 2020 chest radiograph and chest CT FINDINGS: Extensive airspace opacity is noted throughout the left upper and left mid lung regions with areas of apparent cavitation. Less discrete infiltrate is noted throughout the remainder of the left lung, stable. There is underlying emphysematous change. Scattered areas of scarring noted on the right without edema or airspace opacity. Heart size and pulmonary vascularity are normal. No adenopathy. No bone lesions. IMPRESSION: Underlying emphysema. Widespread airspace opacity on the left with areas of cavitary pneumonia throughout the left upper and mid lung regions, similar to recent prior studies. Underlying fibrosis elsewhere, most severe left lower lung region. Scattered areas of scarring on the right without edema or airspace opacity. Stable cardiac silhouette. Electronically Signed   By: Lowella Grip III M.D.   On: 06/23/2020 08:37   DG Chest Portable 1 View  Result Date:  06/18/2020 CLINICAL DATA:  Shortness of breath all summer lung, worsening last week, productive cough, congestion EXAM: PORTABLE CHEST 1 VIEW COMPARISON:  CT 01/17/2019, radiograph 01/17/2019 FINDINGS: There is a background chronic reticular and fibrotic interstitial changes throughout both lungs albeit with new area of superimposed consolidative opacity spanning the left mid lung to apex including an area of potential cavitation seen more peripherally. No discernible pneumothorax or visible effusion. Cardiomediastinal contours are partially obscured by overlying opacity with the visible borders similar in appearance to the comparison studies from prior. No acute osseous or soft tissue abnormality. Degenerative changes are present in the  imaged spine and shoulders. Telemetry leads overlie the chest. IMPRESSION: Consolidative opacity in the left mid lung to apex with some questionable cavitation peripherally. Recommend further characterization with CT of the chest with contrast if patient is able to tolerate. Findings superimposed on chronic reticular and fibrotic interstitial changes. These results were called by telephone at the time of interpretation on 06/18/2020 at 8:45 pm to provider [facility] , who verbally acknowledged these results. Electronically Signed   By: Lovena Le M.D.   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)  - chronic hypoxemia with increased O2 requirement  -his emphysema is beyond repair and patient has very poor prognosis -typical COPD carepath with duoneb, bronchopulmonary hygiene, steroids and antibiotics is appropriate for inpatient therapy   Altered mental status with confusion   - possible septic encephalitis, patient admits to drinking alcohol  - VBG does not support hypercarbic encephalopathy  -patient does not lack capacity and has had psychiatric evaluation    Lower extermity foot wounds  - wound care  And podiatry evaluation - possible  Cellulitis/intertrigo      Thank you for allowing me to participate in the care of this patient.   Patient/Family are satisfied with care plan and all questions have been answered.    This document was prepared using Dragon voice recognition software and may include unintentional dictation errors.     Ottie Glazier, M.D.  Division of [place]

## 2020-06-24 NOTE — Progress Notes (Signed)
PT Cancellation Note  Patient Details Name: Mark [name] MRN: [id number] DOB: 1951/05/03   Cancelled Treatment:     PT attempt. 2nd attempt today. Pt refusing PT at this time requesting PT return tomorrow. Will continue efforts per POC.    [name] 06/24/2020, 1:55 PM

## 2020-06-24 NOTE — Progress Notes (Signed)
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 Likely chronic as per pt's hx. Continue on IV unasyn as per ID. AFB is neg. Strept is neg. Patient has capacity to consent as per psych &  will need close follow-up with psychiatry as outpatient  COPD: severe. Continue on IV abxs, bronchodilators. Encourage incentive spirometry    Chronic hypoxic respiratory failure: continue on supplemental oxygen, currently on 4L [place] which is pt's baseline. Encourage incentive spirometry   Thrombocytosis: likely reactive. Will continue to monitor   Constipation: continue on colace, senna, & miralax  Anxiety: severity unknown. Continue on home dose of klonopin.  Hypokalemia: WNL today. Will continue to monitor   Onychomycosis & PVD: of b/l LEs. S/p bedside nail debridement as per podiatry. Clean feet w/ normal sterile saline as per podiatry. .  Likely GERD: PPI ordered but pt refuses to take it    DVT prophylaxis:  Code Status: DNR Family Communication:  Disposition Plan: likely d/c w/ home health vs hospice   Status is: Inpatient  Remains inpatient appropriate because:IV treatments appropriate due to intensity of illness or inability to take PO   Dispo: The patient is from: Home              Anticipated d/c is to: Home vs hospice               Anticipated d/c date is: 3 days              Patient currently is not medically stable to d/c.     Consultants:   Pulmon  ID   Procedures:   Antimicrobials: unasyn   Subjective: Pt c/o burning sensation in his throat    Objective: Vitals:   06/23/20 1540 06/23/20 2333 06/24/20 0751 06/24/20 0752  BP: 121/68 122/69 118/72   Pulse: (!) 107 (!) 110 (!) 111 (!) 110  Resp: 20 18 17    Temp: 98.9 F (37.2 C) 98.8 F (37.1 C) 98.6 F (37 C)   TempSrc: Oral Oral Oral   SpO2: 96% 97% 95% 95%  Weight:      Height:        Intake/Output Summary (Last 24 hours) at 06/24/2020 1432 Last data filed at 06/24/2020 1202 Gross per 24 hour  Intake 581.23 ml  Output 900 ml  Net -318.77 ml   Filed Weights   06/18/20 2006  Weight: 81.6 kg    Examination:  General exam: Appears calm but uncomfortable. Appears disheveled  Respiratory system: course breath sounds b/l  Cardiovascular system: S1 & S2 +. No rubs, gallops or clicks.  Gastrointestinal system: Abdomen is nondistended, soft and nontender. Hypoactive bowel sounds heard. Central nervous system: Alert and oriented. Moves all 4 extremities  Psychiatry: Judgement and insight appear normal. Flat mood and affect.     Data Reviewed: I have personally reviewed following labs and imaging studies  CBC: Recent Labs  Lab 06/18/20 2014 06/19/20 0506 06/20/20 0445 06/22/20 0604 06/23/20 0957  WBC 22.9* 20.3* 21.6* 22.2* 21.7*  NEUTROABS  --   --   --   --  18.9*  HGB 14.0 13.5  13.0 13.5 13.4  HCT 41.7 40.9 39.1 41.2 42.1  MCV 87.6 87.2 89.7 90.0 91.1  PLT 474* 488* 511* 430* 262*   Basic Metabolic Panel: Recent Labs  Lab 06/19/20 0506 06/20/20 0445 06/22/20 0604 06/23/20 0441 06/23/20 0957  NA 135 136 138 137 135  K 3.9 3.9 3.2* 3.4* 3.6  CL 91* 92* 95* 93* 93*  CO2 31 33* 33* 34* 34*  GLUCOSE 156* 110* 121* 133* 142*  BUN 20 32* 12 11 13   CREATININE 0.72 0.83 0.60* 0.71 0.67  CALCIUM 8.4* 8.7* 8.1* 8.1* 8.0*  MG  --   --   --   --  2.1  PHOS  --   --   --   --  2.6   GFR: Estimated Creatinine Clearance: 95.7 mL/min (by C-G formula based on SCr of 0.67 mg/dL). Liver Function Tests: Recent Labs  Lab 06/23/20 0957  AST 13*  ALT 12   ALKPHOS 97  BILITOT 0.7  PROT 5.7*  ALBUMIN 2.1*   No results for input(s): LIPASE, AMYLASE in the last 168 hours. No results for input(s): AMMONIA in the last 168 hours. Coagulation Profile: No results for input(s): INR, PROTIME in the last 168 hours. Cardiac Enzymes: No results for input(s): CKTOTAL, CKMB, CKMBINDEX, TROPONINI in the last 168 hours. BNP (last 3 results) No results for input(s): PROBNP in the last 8760 hours. HbA1C: No results for input(s): HGBA1C in the last 72 hours. CBG: No results for input(s): GLUCAP in the last 168 hours. Lipid Profile: No results for input(s): CHOL, HDL, LDLCALC, TRIG, CHOLHDL, LDLDIRECT in the last 72 hours. Thyroid Function Tests: No results for input(s): TSH, T4TOTAL, FREET4, T3FREE, THYROIDAB in the last 72 hours. Anemia Panel: No results for input(s): VITAMINB12, FOLATE, FERRITIN, TIBC, IRON, RETICCTPCT in the last 72 hours. Sepsis Labs: Recent Labs  Lab 06/18/20 2054 06/19/20 0506 06/20/20 0445 06/21/20 0400 06/23/20 0957 06/24/20 0437  PROCALCITON  --    < > 0.57 0.43 0.30 0.26  LATICACIDVEN 1.1  --   --   --   --   --    < > = values in this interval not displayed.    Recent Results (from the past 240 hour(s))  SARS Coronavirus 2 by RT PCR (hospital order, performed in [facility] hospital lab) Nasopharyngeal Nasopharyngeal Swab     Status: None   Collection Time: 06/18/20  8:53 PM   Specimen: Nasopharyngeal Swab  Result Value Ref Range Status   SARS Coronavirus 2 NEGATIVE NEGATIVE Final    Comment: (NOTE) SARS-CoV-2 target nucleic acids are NOT DETECTED.  The SARS-CoV-2 RNA is generally detectable in upper and lower respiratory specimens during the acute phase of infection. The lowest concentration of SARS-CoV-2 viral copies this assay can detect is 250 copies / mL. A negative result does not preclude SARS-CoV-2 infection and should not be used as the sole basis for treatment or other patient management decisions.   A negative result may occur with improper specimen collection / handling, submission of specimen other than nasopharyngeal swab, presence of viral mutation(s) within the areas targeted by this assay, and inadequate number of viral copies (<250 copies / mL). A negative result must be combined with clinical observations, patient history, and epidemiological information.  Fact Sheet for Patients:   StrictlyIdeas.no  Fact Sheet for Healthcare Providers: BankingDealers.co.za  This test is not yet approved or  cleared by the [place] FDA and has been authorized for detection and/or diagnosis of SARS-CoV-2 by  FDA under an Emergency Use Authorization (EUA).  This EUA will remain in effect (meaning this test can be used) for the duration of the COVID-19 declaration under Section 564(b)(1) of the Act, 21 U.S.C. section 360bbb-3(b)(1), unless the authorization is terminated or revoked sooner.  Performed at [facility], [address] [place], [place] [address]   Culture, blood (routine x 2)     Status: None   Collection Time: 06/18/20  8:54 PM   Specimen: BLOOD  Result Value Ref Range Status   Specimen Description BLOOD RIGHT FA  Final   Special Requests   Final    BOTTLES DRAWN AEROBIC AND ANAEROBIC Blood Culture adequate volume   Culture   Final    NO GROWTH 5 DAYS Performed at [facility], [address], [place], [place] [address]    Report Status 06/23/2020 FINAL  Final  Culture, blood (routine x 2)     Status: None   Collection Time: 06/18/20  8:54 PM   Specimen: BLOOD  Result Value Ref Range Status   Specimen Description BLOOD LEFT FA  Final   Special Requests   Final    BOTTLES DRAWN AEROBIC AND ANAEROBIC Blood Culture adequate volume   Culture   Final    NO GROWTH 5 DAYS Performed at [facility], [address] [place], [place] [address]    Report Status 06/23/2020 FINAL  Final   Culture, sputum-assessment     Status: None   Collection Time: 06/19/20  7:28 AM   Specimen: Sputum  Result Value Ref Range Status   Specimen Description SPUTUM  Final   Special Requests NONE  Final   Sputum evaluation   Final    THIS SPECIMEN IS ACCEPTABLE FOR SPUTUM CULTURE Performed at [facility], [address], [place], [place] [address]    Report Status 06/19/2020 FINAL  Final  Culture, respiratory     Status: None   Collection Time: 06/19/20  7:28 AM   Specimen: SPU  Result Value Ref Range Status   Specimen Description   Final    SPUTUM Performed at [facility], [address] [place], [place] [address]    Special Requests   Final    NONE Reflexed from [phone] Performed at [facility], [place], [place] [address]    Gram Stain   Final    MODERATE WBC PRESENT,BOTH PMN AND MONONUCLEAR MODERATE GRAM POSITIVE COCCI FEW GRAM VARIABLE ROD RARE YEAST    Culture   Final    FEW Normal respiratory flora-no Staph aureus or Pseudomonas seen Performed at [facility], [place] [address], [place],  [address]    Report Status 06/21/2020 FINAL  Final  Surgical pcr screen     Status: None   Collection Time: 06/22/20  3:22 PM   Specimen: Nasal Mucosa; Nasal Swab  Result Value Ref Range Status   MRSA, PCR NEGATIVE NEGATIVE Final   Staphylococcus aureus NEGATIVE NEGATIVE Final    Comment: (NOTE) The Xpert SA Assay (FDA approved for NASAL specimens in patients 25 years of age and older), is one component of a comprehensive surveillance program. It is not intended to diagnose infection nor to guide or monitor treatment. Performed at [facility], [address] [place],  [address]          Radiology Studies: DG Chest [place] 1 View  Result Date: 06/23/2020 CLINICAL DATA:  Pneumonia EXAM: PORTABLE CHEST 1 VIEW COMPARISON:  June 18, 2020 chest radiograph and chest CT FINDINGS: Extensive airspace  opacity is noted throughout the left upper and left mid lung regions with areas of apparent cavitation. Less discrete infiltrate is noted throughout the remainder of the left lung, stable. There is underlying emphysematous change. Scattered areas of scarring noted on the right without edema or airspace opacity. Heart size and pulmonary vascularity are normal. No adenopathy. No bone lesions. IMPRESSION: Underlying emphysema. Widespread airspace opacity on the left with areas of cavitary pneumonia throughout the left upper and mid lung regions, similar to recent prior studies. Underlying fibrosis elsewhere, most severe left lower lung region. Scattered areas of scarring on the right without edema or airspace opacity. Stable cardiac silhouette. Electronically Signed   By: Lowella Grip III M.D.   On: 06/23/2020 08:37        Scheduled Meds: . aspirin EC  81 mg Oral Daily  . clonazePAM  1 mg Oral BID  . enoxaparin (LOVENOX) injection  40 mg Subcutaneous Q24H  . fluticasone furoate-vilanterol  1 puff Inhalation Daily  . guaiFENesin  1,200 mg Oral BID  . ketorolac  30 mg Intravenous Once  . levothyroxine  25 mcg Oral QAC breakfast  . pantoprazole  40 mg Oral Daily  . senna-docusate  1 tablet Oral BID  . tiotropium  18 mcg Inhalation Daily  . umeclidinium bromide  1 puff Inhalation Daily   Continuous Infusions: . ampicillin-sulbactam (UNASYN) IV 3 g (06/24/20 1200)     LOS: 6 days    Time spent: 34 mins    Wyvonnia Dusky, MD [facility] Pager [phone]  If 7PM-7AM, please contact night-coverage [email or web address] 06/24/2020, 2:32 PM

## 2020-06-25 ENCOUNTER — Inpatient Hospital Stay: Payer: Medicare Other

## 2020-06-25 DIAGNOSIS — Z515 Encounter for palliative care: Secondary | ICD-10-CM

## 2020-06-25 DIAGNOSIS — Z7189 Other specified counseling: Secondary | ICD-10-CM

## 2020-06-25 LAB — CBC
HCT: 37.4 % — ABNORMAL LOW (ref 39.0–52.0)
Hemoglobin: 12.1 g/dL — ABNORMAL LOW (ref 13.0–17.0)
MCH: 29.1 pg (ref 26.0–34.0)
MCHC: 32.4 g/dL (ref 30.0–36.0)
MCV: 89.9 fL (ref 80.0–100.0)
Platelets: 501 10*3/uL — ABNORMAL HIGH (ref 150–400)
RBC: 4.16 MIL/uL — ABNORMAL LOW (ref 4.22–5.81)
RDW: 14.5 % (ref 11.5–15.5)
WBC: 15 10*3/uL — ABNORMAL HIGH (ref 4.0–10.5)
nRBC: 0 % (ref 0.0–0.2)

## 2020-06-25 LAB — BASIC METABOLIC PANEL
Anion gap: 9 (ref 5–15)
BUN: 11 mg/dL (ref 8–23)
CO2: 33 mmol/L — ABNORMAL HIGH (ref 22–32)
Calcium: 7.8 mg/dL — ABNORMAL LOW (ref 8.9–10.3)
Chloride: 96 mmol/L — ABNORMAL LOW (ref 98–111)
Creatinine, Ser: 0.66 mg/dL (ref 0.61–1.24)
GFR calc Af Amer: >60 mL/min (ref >60)
GFR calc non Af Amer: >60 mL/min (ref >60)
Glucose, Bld: 102 mg/dL — ABNORMAL HIGH (ref 70–99)
Potassium: 3.4 mmol/L — ABNORMAL LOW (ref 3.5–5.1)
Sodium: 138 mmol/L (ref 135–145)

## 2020-06-25 LAB — PROCALCITONIN: Procalcitonin: 0.17 ng/mL

## 2020-06-25 MED ORDER — SUCRALFATE 1 G PO TABS
1.0000 g | ORAL_TABLET | Freq: Three times a day (TID) | ORAL | Status: DC
  Administered 2020-06-25 – 2020-07-09 (×45): 1 g via ORAL
  Filled 2020-06-25 (×50): qty 1

## 2020-06-25 MED ORDER — ALUM & MAG HYDROXIDE-SIMETH 200-200-20 MG/5ML PO SUSP: 30.0000 mL | Freq: Four times a day (QID) | ORAL | Status: DC | PRN

## 2020-06-25 MED ORDER — IBUPROFEN 400 MG PO TABS
600.0000 mg | ORAL_TABLET | Freq: Once | ORAL | Status: AC
  Administered 2020-06-25: 600 mg via ORAL
  Filled 2020-06-25: qty 2

## 2020-06-25 MED ORDER — LIDOCAINE VISCOUS HCL 2 % MT SOLN
15.0000 mL | Freq: Once | OROMUCOSAL | Status: DC
  Filled 2020-06-25: qty 15

## 2020-06-25 MED ORDER — POTASSIUM CHLORIDE CRYS ER 20 MEQ PO TBCR
20.0000 meq | EXTENDED_RELEASE_TABLET | Freq: Once | ORAL | Status: AC
  Administered 2020-06-25: 20 meq via ORAL
  Filled 2020-06-25: qty 1

## 2020-06-25 MED ORDER — IPRATROPIUM BROMIDE HFA 17 MCG/ACT IN AERS
2.0000 | INHALATION_SPRAY | RESPIRATORY_TRACT | Status: DC
  Administered 2020-06-25 – 2020-07-09 (×66): 2 via RESPIRATORY_TRACT
  Filled 2020-06-25 (×2): qty 12.9

## 2020-06-25 NOTE — Progress Notes (Signed)
Daily Progress Note   Patient Name: Mark [name]       Date: 06/25/2020 DOB: [birth year]  Age: 69 y.o. MRN#: [id number] Attending Physician: Wyvonnia Dusky, MD Primary Care Physician: Sandra Cockayne, MD Admit Date: 06/18/2020  Reason for Consultation/Follow-up: Establishing goals of care   Subjective: Patient is resting in bed, he complains of generalized pain. He feels he has improved from last visit, though he states it is hard to know since his medications are not being given precisely as they are at home. He states "I'm not ready to give up, I'm too stubborn to die." He would like to continue to treat the treatable. He confirms DNR/DNI. He does state he would be willing to accept BIPAP if it were needed. Discussed limits such as number of continuous days of BIPAP, and he tells me he would want to transition to comfort care if he becomes continuously dependent on it. He requests SNF placement as he advises he cannot care for himself at home.    Recommend palliative at D/C.    Length of Stay: 7  Current Medications: Scheduled Meds:  . aspirin EC  81 mg Oral Daily  . clonazePAM  1 mg Oral BID  . enoxaparin (LOVENOX) injection  40 mg Subcutaneous Q24H  . fluticasone furoate-vilanterol  1 puff Inhalation Daily  . guaiFENesin  1,200 mg Oral BID  . ipratropium  2 puff Inhalation Q4H  . levothyroxine  25 mcg Oral QAC breakfast  . lidocaine  15 mL Oral Once  . pantoprazole  40 mg Oral Daily  . senna-docusate  1 tablet Oral BID  . sucralfate  1 g Oral TID WC & HS  . umeclidinium bromide  1 puff Inhalation Daily    Continuous Infusions: . ampicillin-sulbactam (UNASYN) IV 3 g (06/25/20 0555)    PRN Meds: albuterol, alum & mag hydroxide-simeth **AND** lidocaine,  chlorpheniramine-HYDROcodone, fluticasone, polyethylene glycol  Physical Exam Pulmonary:     Effort: Pulmonary effort is normal.  Neurological:     Mental Status: He is alert.             Vital Signs: BP (!) 109/55 (BP Location: Right Arm)   Pulse 100   Temp 98.4 F (36.9 C) (Oral)   Resp 18   Ht 6' (1.829 m)   Wt 81.6 kg  SpO2 93%   BMI 24.41 kg/m  SpO2: SpO2: 93 % O2 Device: O2 Device: Nasal Cannula O2 Flow Rate: O2 Flow Rate (L/min): 3 L/min  Intake/output summary:   Intake/Output Summary (Last 24 hours) at 06/25/2020 1158 Last data filed at 06/25/2020 1022 Gross per 24 hour  Intake 665.71 ml  Output 1500 ml  Net -834.29 ml   LBM: Last BM Date: 06/23/20 Baseline Weight: Weight: 81.6 kg Most recent weight: Weight: 81.6 kg       Palliative Assessment/Data: 60%      Patient Active Problem List   Diagnosis Date Noted  . Abscess of left lung with pneumonia ([place]) 06/19/2020  . Necrotizing pneumonia ([place]) 06/18/2020  . Chest pain 01/17/2019  . Hypothyroidism 01/17/2019  . Hypokalemia 01/10/2017  . Elevated C-reactive protein (CRP) 01/10/2017  . Elevated sed rate 01/10/2017  . Chronic hip pain (Location of Tertiary source of pain) (Bilateral) (L>R) 01/10/2017  . Chronic pain syndrome 10/24/2016  . Chronic back pain (Location of Primary Source of Pain) (Bilateral) (R>L) 10/24/2016  . Disturbance of skin sensation 05/05/2016  . Peripheral neuropathy 05/05/2016  . Chronic knee pain (Bilateral) (L>R) 05/05/2016  . Chronic shoulder pain (Bilateral) (R>L) 05/05/2016  . Opioid-induced constipation (OIC) 05/05/2016  . Restless leg syndrome 05/05/2016  . Mediastinal adenopathy 05/05/2016  . Pulmonary nodule (7 mm right lower lobe) 05/05/2016  . Inguinal hernia without obstruction (Right) 05/05/2016  . 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 Spasm of back muscles 05/05/2016  . Chronic musculoskeletal pain  05/05/2016  . Neurogenic pain 05/05/2016  . Neuropathic pain 05/05/2016  . Sacral back pain (Location of Secondary source of pain) (Bilateral) (L>R) 05/05/2016  . Logorrhea 05/05/2016  . Chronic lower extremity pain (Bilateral) (L>R) 05/05/2016  . Chronic neck pain (posterior) (L>R) 05/05/2016  . 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 Cervical facet hypertrophy (C2-3) (Bilateral) (L>R) 05/05/2016  . Cervical facet syndrome (Bilateral) (L>R) 05/05/2016  . 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) 05/05/2016  . Compression fracture of L2 ([place]) (seen on 11/25/2014 x-ray) 05/05/2016  . Anemia, chronic disease 05/05/2016  . Lumbar facet syndrome (Bilateral) (R>L) 05/05/2016  . Opiate use (75 MME/Day) 03/14/2016  . Long term prescription opiate use 03/14/2016  . Long term current use of opiate analgesic 03/14/2016  . Long term prescription benzodiazepine use 03/14/2016  . History of marijuana use 03/14/2016  . Compression fractures (L4, L3, L1, T12 and T11) 06/26/2015  . Acquired atrophy of thyroid 06/26/2015  . OP (osteoporosis) 06/26/2015  . Other long term (current) drug therapy 07/12/2013  . Pain medication agreement broken 07/12/2013  . Clinical depression 01/20/2005  . Esophagitis, reflux 06/01/2004  . Current tobacco use 06/01/2004  . Anxiety 05/27/2004  . 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 States he would be agreeable to intermittent BIPAP, but would want to shift to comfort if he developed a continuous need for BIPAP. He states he feels better than he did and would like to continue to treat the treatable.   Recommend palliative at D/C.   Code Status:    Code Status Orders  (From admission, onward)         Start     Ordered   06/18/20 2325  Full code  Continuous        06/18/20 2325  Code Status History    Date Active Date Inactive Code Status Order ID Comments User Context   01/18/2019 0010 01/18/2019 2118 Full Code [id number]  Lance Coon, MD Inpatient   05/01/2016 0934 05/03/2016 1455 Full Code [id number]  Harrie Foreman, MD Inpatient   Advance Care Planning Activity       Prognosis:   < 6 months  Discharge Planning:  To Be Determined  Care plan was discussed with Pulmonology. Message sent to primary team in epic chat.  Thank you for allowing the Palliative Medicine Team to assist in the care of this patient.   Total Time 35 min Prolonged Time Billed  no      Greater than 50%  of this time was spent counseling and coordinating care related to the above assessment and plan.  Asencion Gowda, NP  Please contact Palliative Medicine Team phone at [phone] for questions and concerns.

## 2020-06-25 NOTE — Progress Notes (Signed)
Pulmonary Medicine          Date: 06/25/2020,   MRN# [id number] Mark [name] Sep 19, 1951     AdmissionWeight: 81.6 kg                 CurrentWeight: 81.6 kg   Referring physician: Dr. Manuella Ghazi   CHIEF COMPLAINT:   Necrotizing pneumonia   HISTORY OF PRESENT ILLNESS   Is a pleasant 69 year old male with a history of chronic pain syndrome with opioid-induced constipation, GERD, advanced COPD chronic hypoxemia on 4 L/min supplemental oxygen home O2.  Also has a background history of hypothyroidism, peripheral neuropathy chronic lumbago, reports worsening respiratory status x2 to 3 months with signs and symptoms of acute COPD exacerbation with increased volume and darkening of phlegm on expectoration.  Most recently in the last 2 weeks prior to admission he developed chills diaphoresis and subjective fevers.  In the ER he was found to have leukocytosis, negative COVID-19 test x2, increased O2 requirement from 4 to 5 L/min nasal cannula however venous blood gas was essentially normal BNP and troponin also within reference range.  CT chest shows necrotizing pneumonia with consolidative infiltrate of the left upper and lower lung zones as well as fluid level cystic lesion suggestive of abscess with sorrounding emphysema.  Pulmonary consultation placed for further evaluation and management of necrotizing pneumonia.  He has crusted swollen erythematous feet with intertrigo, he has distended abdomen, poor dentition, and reports broken back. He shares that he has no family and when asked regarding medical proxy he states "im on my own".  Overall he looks chronically ill.    06/20/20- patient had a good night sleep and now is more lucid, hes drinking coffee.  He is speaking rapidly with cicumferential and tangential language. WBC count is with mild trend up.   06/22/20- patient is DNR/DNI after speaking to palliative today - appreciate collaboration. Sputum with GPCs. Patient requests MDI  inhaler we discussed use of Spiriva and Symbicort.  Mild hypokalemia this am, pharmacy consult for repletion. Repeat CXR in am.    06/23/20- patient appears clincally improved this morning.  CXR this am reviewed by me - left lung cavitary pneumonia and superimposed combined pulmonary fibrosis and emphysema. Patient is on 4L/min [place]  06/24/20-patient is similar as yesterday he complains of post prandial substernal chest discomfort, we discussed potential GERD related pain. He has extensive emphysema and will unlikely be a good candidate for bronchoscopy. We will order spirometry to evaluate candidacy for anesthesia.   06/25/20- patient is slightly imrproved this am.  Leukocytosis finally trending down, and he is coughing up more phlegm thickened brown material possibly aspirated food stuffs. He has GERD will order lidocaine GI coctail and carafate.    PAST MEDICAL HISTORY   Past Medical History:  Diagnosis Date   Acute respiratory failure with hypoxia and hypercapnia (HCC) 05/01/2016   Asthma    COPD (chronic obstructive pulmonary disease) (HCC)    Osteoporosis      SURGICAL HISTORY   Past Surgical History:  Procedure Laterality Date   BACK SURGERY       FAMILY HISTORY   Family History  Problem Relation Age of Onset   Pulmonary fibrosis Mother      SOCIAL HISTORY   Social History   Tobacco Use   Smoking status: Former Smoker   Smokeless tobacco: Never Used  Substance Use Topics   Alcohol use: No   Drug use: No     MEDICATIONS  Home Medication:    Current Medication:  Current Facility-Administered Medications:    albuterol (PROVENTIL) (2.5 MG/3ML) 0.083% nebulizer solution 3 mL, 3 mL, Inhalation, Q4H PRN, Zada Finders R, MD, 3 mL at 06/25/20 0152   Ampicillin-Sulbactam (UNASYN) 3 g in sodium chloride 0.9 % 100 mL IVPB, 3 g, Intravenous, Q6H, Duncan, Asajah R, RPH, Last Rate: 200 mL/hr at 06/25/20 0555, 3 g at 06/25/20 0555   aspirin EC tablet 81  mg, 81 mg, Oral, Daily, Amery, Sahar, MD, 81 mg at 06/24/20 0846   chlorpheniramine-HYDROcodone (TUSSIONEX) 10-8 MG/5ML suspension 5 mL, 5 mL, Oral, Q4H PRN, Max Sane, MD, 5 mL at 06/19/20 1630   clonazePAM (KLONOPIN) tablet 1 mg, 1 mg, Oral, BID, Zada Finders R, MD, 1 mg at 06/24/20 2028   enoxaparin (LOVENOX) injection 40 mg, 40 mg, Subcutaneous, Q24H, Patel, Vishal R, MD, 40 mg at 06/22/20 0856   fluticasone (FLONASE) 50 MCG/ACT nasal spray 2 spray, 2 spray, Each Nare, Daily PRN, Kurtis Bushman, Sahar, MD   fluticasone furoate-vilanterol (BREO ELLIPTA) 200-25 MCG/INH 1 puff, 1 puff, Inhalation, Daily, Amery, Sahar, MD, 1 puff at 06/24/20 0847   guaiFENesin (MUCINEX) 12 hr tablet 1,200 mg, 1,200 mg, Oral, BID, Zada Finders R, MD, 1,200 mg at 06/24/20 2028   levothyroxine (SYNTHROID) tablet 25 mcg, 25 mcg, Oral, QAC breakfast, Zada Finders R, MD, 25 mcg at 06/25/20 0531   pantoprazole (PROTONIX) EC tablet 40 mg, 40 mg, Oral, Daily, Jimmye Norman, Jamiese M, MD   polyethylene glycol (MIRALAX / GLYCOLAX) packet 17 g, 17 g, Oral, Daily PRN, Nolberto Hanlon, MD, 17 g at 06/22/20 1353   senna-docusate (Senokot-S) tablet 1 tablet, 1 tablet, Oral, BID, Nolberto Hanlon, MD, 1 tablet at 06/23/20 0835   tiotropium (SPIRIVA) inhalation capsule ([facility] use ONLY) 18 mcg, 18 mcg, Inhalation, Daily, Amery, Sahar, MD, 18 mcg at 06/24/20 0845   [DISCONTINUED] fluticasone furoate-vilanterol (BREO ELLIPTA) 100-25 MCG/INH 1 puff, 1 puff, Inhalation, Daily, 1 puff at 06/22/20 0856 **AND** umeclidinium bromide (INCRUSE ELLIPTA) 62.5 MCG/INH 1 puff, 1 puff, Inhalation, Daily, Lu Duffel, RPH, 1 puff at 06/24/20 4332    ALLERGIES   Patient has no known allergies.     REVIEW OF SYSTEMS    Review of Systems:  Gen:  Denies  fever, sweats, chills weigh loss  HEENT: Denies blurred vision, double vision, ear pain, eye pain, hearing loss, nose bleeds, sore throat Cardiac:  No dizziness, chest pain or  heaviness, chest tightness,edema Resp:   Reports SOB Gi: Denies swallowing difficulty, stomach pain, nausea or vomiting, diarrhea, constipation, bowel incontinence Gu:  Denies bladder incontinence, burning urine Ext:   Denies Joint pain, stiffness or swelling Skin: Denies  skin rash, easy bruising or bleeding or hives Endoc:  Denies polyuria, polydipsia , polyphagia or weight change Psych:   Denies depression, insomnia or hallucinations   Other:  All other systems negative   VS: BP (!) 109/55 (BP Location: Right Arm)    Pulse 100    Temp 98.4 F (36.9 C) (Oral)    Resp 18    Ht 6' (1.829 m)    Wt 81.6 kg    SpO2 93%    BMI 24.41 kg/m      PHYSICAL EXAM    GENERAL:NAD, no fevers, chills, no weakness no fatigue HEAD: Normocephalic, atraumatic.  EYES: Pupils equal, round, reactive to light. Extraocular muscles intact. No scleral icterus.  MOUTH: Moist mucosal membrane. Dentition intact. No abscess noted.  EAR, NOSE, THROAT: Clear without exudates. No external  lesions.  NECK: Supple. No thyromegaly. No nodules. No JVD.  PULMONARY:rhonchi bilaterally  CARDIOVASCULAR: S1 and S2. Regular rate and rhythm. No murmurs, rubs, or gallops. No edema. Pedal pulses 2+ bilaterally.  GASTROINTESTINAL: Soft, nontender, nondistended. No masses. Positive bowel sounds. No hepatosplenomegaly.  MUSCULOSKELETAL: No swelling, clubbing, or edema. Range of motion full in all extremities.  NEUROLOGIC: Cranial nerves II through XII are intact. No gross focal neurological deficits. Sensation intact. Reflexes intact.  SKIN: crusted feet with erythemaouts intertrigo. PSYCHIATRIC: Mood, affect within normal limits. The patient is awake, alert and oriented x 3. Insight, judgment intact.       IMAGING    CT Chest W Contrast  Result Date: 06/18/2020 CLINICAL DATA:  69 year old male with shortness of breath, productive cough, pneumonia. On home oxygen. EXAM: CT CHEST WITH CONTRAST TECHNIQUE: Multidetector CT  imaging of the chest was performed during intravenous contrast administration. CONTRAST:  45mL OMNIPAQUE IOHEXOL 300 MG/ML  SOLN COMPARISON:  Portable chest earlier today.  Chest CT 01/17/2020. FINDINGS: Cardiovascular: No cardiomegaly or pericardial effusion. Calcified coronary artery and aortic atherosclerosis. The central vascular structures of the mediastinum are enhancing and appear to be patent. Mediastinum/Nodes: Mildly increased in reactive appearing mediastinal lymph nodes compared to 2020, up to 14 mm short axis. Some nodes (right paratracheal on series 2, image 47) have not significantly changed. Additionally, there is evidence of a larger 15 mm short axis left AP window node. Lungs/Pleura: Chronic pulmonary hyperinflation. Extensive chronic emphysema. Previous bullous emphysema in the left upper lung where now there is extensive consolidation with numerous small rounded areas of opacity suspicious for cavitation. There is a prominent fluid level lateral to the left hilum on series 3, image 65 suspicious for lung abscess. Abnormal interstitial thickening and peribronchial nodularity throughout the left lung. No superimposed pneumothorax. Only trace left pleural fluid. The major airways remain patent. Right lung emphysema and markings appear stable since last year. Upper Abdomen: Negative visible liver, gallbladder, spleen, pancreas, adrenal glands, kidneys, and bowel in the upper abdomen. Musculoskeletal: Chronic compression fractures and ankylosis in the spine, with several levels of previous vertebral body augmentation, appears stable since last year. Occasional chronic rib fractures. No acute osseous abnormality identified. No chest wall abnormality. IMPRESSION: 1. 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 Consider necrotizing pneumonia as multiple areas are suspicious for cavitation, and especially a fluid-level lateral to the left hilum (series 2, image 68) strongly  suggesting Lung Abscess. 2. Right lung unaffected.  Only trace left pleural fluid. 3. Reactive appearing mediastinal lymph nodes. 4. Calcified coronary artery and Aortic Atherosclerosis (ICD10-I70.0). Emphysema (ICD10-J43.9). Electronically Signed   By: Genevie Ann M.D.   On: 06/18/2020 22:14   DG Chest Port 1 View  Result Date: 06/23/2020 CLINICAL DATA:  Pneumonia EXAM: PORTABLE CHEST 1 VIEW COMPARISON:  June 18, 2020 chest radiograph and chest CT FINDINGS: Extensive airspace opacity is noted throughout the left upper and left mid lung regions with areas of apparent cavitation. Less discrete infiltrate is noted throughout the remainder of the left lung, stable. There is underlying emphysematous change. Scattered areas of scarring noted on the right without edema or airspace opacity. Heart size and pulmonary vascularity are normal. No adenopathy. No bone lesions. IMPRESSION: Underlying emphysema. Widespread airspace opacity on the left with areas of cavitary pneumonia throughout the left upper and mid lung regions, similar to recent prior studies. Underlying fibrosis elsewhere, most severe left lower lung region. Scattered areas of scarring on the right without edema or  airspace opacity. Stable cardiac silhouette. Electronically Signed   By: Lowella Grip III M.D.   On: 06/23/2020 08:37   DG Chest Portable 1 View  Result Date: 06/18/2020 CLINICAL DATA:  Shortness of breath all summer lung, worsening last week, productive cough, congestion EXAM: PORTABLE CHEST 1 VIEW COMPARISON:  CT 01/17/2019, radiograph 01/17/2019 FINDINGS: There is a background chronic reticular and fibrotic interstitial changes throughout both lungs albeit with new area of superimposed consolidative opacity spanning the left mid lung to apex including an area of potential cavitation seen more peripherally. No discernible pneumothorax or visible effusion. Cardiomediastinal contours are partially obscured by overlying opacity with the  visible borders similar in appearance to the comparison studies from prior. No acute osseous or soft tissue abnormality. Degenerative changes are present in the imaged spine and shoulders. Telemetry leads overlie the chest. IMPRESSION: Consolidative opacity in the left mid lung to apex with some questionable cavitation peripherally. Recommend further characterization with CT of the chest with contrast if patient is able to tolerate. Findings superimposed on chronic reticular and fibrotic interstitial changes. These results were called by telephone at the time of interpretation on 06/18/2020 at 8:45 pm to provider [facility] , who verbally acknowledged these results. Electronically Signed   By: Lovena Le M.D.   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)  - chronic hypoxemia with increased O2 requirement  -his emphysema is beyond repair and patient has very poor prognosis -typical COPD carepath with duoneb, bronchopulmonary hygiene, steroids and antibiotics is appropriate for inpatient therapy   Altered mental status with confusion   - possible septic encephalitis, patient admits to drinking alcohol  - VBG does not support hypercarbic encephalopathy  -patient does not lack capacity and has had psychiatric evaluation    Lower extermity foot wounds  - wound care  And podiatry evaluation - possible  Cellulitis/intertrigo     GERD with aspiration  lidocaine malox GI cocktail q6hprn -carafate and protonix    Thank you for allowing me to participate in the care of this patient.   Patient/Family are satisfied with care plan and all questions have been answered.  This document was prepared using Dragon voice recognition software and may include unintentional dictation errors.     Ottie Glazier, M.D.  Division of [place]

## 2020-06-25 NOTE — Progress Notes (Signed)
Physical Therapy Treatment Patient Details Name: Mark [name] MRN: [id number] DOB: Mar 02, 1951 Today's Date: 06/25/2020    History of Present Illness Jearld Lassen is a 69 yo male with onset of fever, PN back pain and increased work of breathing was diagnosed with COPD exacerbation, leukocytosis, and emphysema was noted to have necrotizing PNA.  PMHx:  asthma, COPD, acute respiratory failure, osteoporosis, 4L O2 continually avoiding falling at home    PT Comments    Pt in bed.  NP in room discussing with pt.  Pt voicing concerns regarding discharge home to a "toxic environment" and stating he does not feel safe or ready to manage on his own.  Initially he is resistant to session as he was at testing previously and generally fatigued but agrees with encouragement.  To EOB with min a x 1 despite cues to try on his own using rails.  Steady once sitting.  He is focused on his pulse O2 from home taking it frequently in multiple positions during transition.  Once sitting EOB sats 87% Increased to 4 lpm per his request.  "That's it for therapy today." in regards to O2 below 88%.  He then stated he needs to use the bathroom and wants to walk in.  Education regarding O2 sats and a bedside commode was brought and he was able to transfer with min a x 1.  Ind self care. Transfers back to bed with min guard and able to get LE's back onto bed on his own.  He stated O2 should be on 4 lpm as at home.  Stated he self regulates it at home and requests it to be left there.  Discussed with RN after session and OK to leave at 4 lpm.  Given discussion with pt and discharge plan and general performance today, will adjust discharge recommendations to SNF given limited mobility and assist.   Follow Up Recommendations  SNF     Equipment Recommendations       Recommendations for Other Services       Precautions / Restrictions Precautions Precautions: Fall Precaution Comments: monitor use of  O2 Restrictions Weight Bearing Restrictions: No    Mobility  Bed Mobility Overal bed mobility: Needs Assistance Bed Mobility: Supine to Sit;Sit to Supine     Supine to sit: Min assist Sit to supine: Min guard      Transfers Overall transfer level: Needs assistance Equipment used: None Transfers: Stand Pivot Transfers   Stand pivot transfers: Min assist          Ambulation/Gait                 Stairs             Wheelchair Mobility    Modified Rankin (Stroke Patients Only)       Balance Overall balance assessment: Needs assistance Sitting-balance support: Feet supported Sitting balance-Leahy Scale: Good     Standing balance support: Bilateral upper extremity supported;During functional activity Standing balance-Leahy Scale: Fair                              Cognition Arousal/Alertness: Awake/alert Behavior During Therapy: WFL for tasks assessed/performed Overall Cognitive Status: No family/caregiver present to determine baseline cognitive functioning                                 General Comments: self directs therapy session  Exercises      General Comments        Pertinent Vitals/Pain Pain Assessment: No/denies pain    Home Living                      Prior Function            PT Goals (current goals can now be found in the care plan section) Progress towards PT goals: Progressing toward goals    Frequency    Min 2X/week      PT Plan Discharge plan needs to be updated    Co-evaluation              AM-PAC PT "6 Clicks" Mobility   Outcome Measure  Help needed turning from your back to your side while in a flat bed without using bedrails?: A Little Help needed moving from lying on your back to sitting on the side of a flat bed without using bedrails?: A Little Help needed moving to and from a bed to a chair (including a wheelchair)?: A Little Help needed standing up  from a chair using your arms (e.g., wheelchair or bedside chair)?: A Little Help needed to walk in hospital room?: A Lot Help needed climbing 3-5 steps with a railing? : Total 6 Click Score: 15    End of Session Equipment Utilized During Treatment: Gait belt;Oxygen Activity Tolerance: Patient limited by fatigue Patient left: in bed;with bed alarm set;with chair alarm set Nurse Communication: Mobility status;Other (comment)       Time: 2671-2458 PT Time Calculation (min) (ACUTE ONLY): 38 min  Charges:  $Therapeutic Activity: 38-52 mins                    Chesley Noon, PTA 06/25/20, 2:10 PM

## 2020-06-25 NOTE — Care Management Important Message (Signed)
Important Message  Patient Details  Name: Mark [name] MRN: [id number] Date of Birth: 12/26/1950   Medicare Important Message Given:  Yes     Dannette Barbara 06/25/2020, 1:06 PM

## 2020-06-25 NOTE — Progress Notes (Signed)
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 Likely chronic as per pt's hx. Continue on IV unasyn as per ID. AFB is neg. Strept is neg. Patient has capacity to consent as per psych &  will need close follow-up with psychiatry as outpatient. Poor prognosis.   COPD: severe & progressive. Continue on IV abxs, bronchodilators. Encourage incentive spirometry. Pulmon following and recs apprec. Poor prognosis  Chronic hypoxic respiratory failure: continue on supplemental oxygen, currently on 4L [place] which is pt's baseline. Encourage incentive spirometry   Thrombocytosis: likely reactive. Labile Will continue to monitor   Leukocytosis: likely secondary to infection. Will continue on IV abxs  Constipation: continue on colace, senna, & miralax  Anxiety: severity unknown. Continue on home dose of klonopin   Hypokalemia: potassium ordered. Will continue to monitor   Onychomycosis & PVD: of b/l LEs. S/p bedside nail debridement as per podiatry. Clean feet w/ normal sterile saline as per podiatry. .  Likely GERD: continue on PPI    DVT prophylaxis: lovenox  Code Status: DNR Family Communication:  Disposition Plan: therapy will need reevaluate the pt again   Status is: Inpatient  Remains inpatient appropriate because:IV treatments appropriate due to intensity of illness or inability to take PO   Dispo: The patient is from: Home              Anticipated d/c is to: Home vs hospice               Anticipated d/c date is: 3 days              Patient currently is not medically stable to  d/c.     Consultants:   Pulmon  ID   Procedures:   Antimicrobials: unasyn   Subjective: Pt c/o cough and shortness of breath   Objective: Vitals:   06/24/20 1608 06/24/20 2345 06/25/20 0152 06/25/20 0732  BP: 131/67 128/61  (!) 109/55  Pulse: (!) 104 (!) 103  100  Resp: 17 16  18   Temp: 98.3 F (36.8 C) 97.7 F (36.5 C)  98.4 F (36.9 C)  TempSrc: Oral Oral  Oral  SpO2: 94% 96% 96% 93%  Weight:      Height:        Intake/Output Summary (Last 24 hours) at 06/25/2020 0818 Last data filed at 06/25/2020 0600 Gross per 24 hour  Intake 665.71 ml  Output 1200 ml  Net -534.29 ml   Filed Weights   06/18/20 2006  Weight: 81.6 kg    Examination:  General exam: Appears calm & comfortable. Appears disheveled  Respiratory system: course breath sounds b/l. No rales Cardiovascular system: S1 & S2 +. No rubs, gallops or clicks.  Gastrointestinal system: Abd is soft, non-distended, non-tender and hypoactive bowel sounds . Central nervous system: Alert and oriented. Moves all 4 extremities  Psychiatry: Judgement and insight appear normal. Flat mood and affect.     Data Reviewed: I have personally reviewed following labs and imaging studies  CBC: Recent Labs  Lab 06/19/20 0506 06/20/20 0445 06/22/20 0604 06/23/20 0957 06/25/20 0508  WBC 20.3* 21.6* 22.2* 21.7* 15.0*  NEUTROABS  --   --   --  18.9*  --   HGB 13.5 13.0 13.5 13.4 12.1*  HCT 40.9 39.1 41.2 42.1 37.4*  MCV 87.2 89.7 90.0 91.1 89.9  PLT 488* 511* 430* 483* 884*   Basic Metabolic Panel: Recent Labs  Lab 06/20/20 0445 06/22/20 0604 06/23/20 0441 06/23/20 0957 06/25/20 0508  NA 136 138 137 135 138  K 3.9 3.2* 3.4* 3.6 3.4*  CL 92* 95* 93* 93* 96*  CO2 33* 33* 34* 34* 33*  GLUCOSE 110* 121* 133* 142* 102*  BUN 32* 12 11 13 11   CREATININE 0.83 0.60* 0.71 0.67 0.66  CALCIUM 8.7* 8.1* 8.1* 8.0* 7.8*  MG  --   --   --  2.1  --   PHOS  --   --   --  2.6  --    GFR: Estimated Creatinine  Clearance: 95.7 mL/min (by C-G formula based on SCr of 0.66 mg/dL). Liver Function Tests: Recent Labs  Lab 06/23/20 0957  AST 13*  ALT 12  ALKPHOS 97  BILITOT 0.7  PROT 5.7*  ALBUMIN 2.1*   No results for input(s): LIPASE, AMYLASE in the last 168 hours. No results for input(s): AMMONIA in the last 168 hours. Coagulation Profile: No results for input(s): INR, PROTIME in the last 168 hours. Cardiac Enzymes: No results for input(s): CKTOTAL, CKMB, CKMBINDEX, TROPONINI in the last 168 hours. BNP (last 3 results) No results for input(s): PROBNP in the last 8760 hours. HbA1C: No results for input(s): HGBA1C in the last 72 hours. CBG: No results for input(s): GLUCAP in the last 168 hours. Lipid Profile: No results for input(s): CHOL, HDL, LDLCALC, TRIG, CHOLHDL, LDLDIRECT in the last 72 hours. Thyroid Function Tests: No results for input(s): TSH, T4TOTAL, FREET4, T3FREE, THYROIDAB in the last 72 hours. Anemia Panel: No results for input(s): VITAMINB12, FOLATE, FERRITIN, TIBC, IRON, RETICCTPCT in the last 72 hours. Sepsis Labs: Recent Labs  Lab 06/18/20 2054 06/19/20 0506 06/21/20 0400 06/23/20 0957 06/24/20 0437 06/25/20 0508  PROCALCITON  --    < > 0.43 0.30 0.26 0.17  LATICACIDVEN 1.1  --   --   --   --   --    < > = values in this interval not displayed.    Recent Results (from the past 240 hour(s))  SARS Coronavirus 2 by RT PCR (hospital order, performed in [facility] hospital lab) Nasopharyngeal Nasopharyngeal Swab     Status: None   Collection Time: 06/18/20  8:53 PM   Specimen: Nasopharyngeal Swab  Result Value Ref Range Status   SARS Coronavirus 2 NEGATIVE NEGATIVE Final    Comment: (NOTE) SARS-CoV-2 target nucleic acids are NOT DETECTED.  The SARS-CoV-2 RNA is generally detectable in upper and lower respiratory specimens during the acute phase of infection. The lowest concentration of SARS-CoV-2 viral copies this assay can detect is 250 copies / mL. A  negative result does not preclude SARS-CoV-2 infection and should not be used as the sole basis for treatment or other patient management decisions.  A negative result may occur with improper specimen collection / handling, submission of specimen other than nasopharyngeal swab, presence of viral mutation(s) within the areas targeted by this assay, and inadequate number of viral copies (<250 copies / mL). A negative result must be combined with clinical observations, patient history, and epidemiological information.  Fact Sheet for Patients:   StrictlyIdeas.no  Fact Sheet for Healthcare Providers: BankingDealers.co.za  This test is not yet approved or  cleared by the [place] and has been authorized for detection and/or diagnosis of SARS-CoV-2 by FDA under an Emergency Use Authorization (EUA).  This EUA will remain in effect (meaning this test can be used) for the duration of the COVID-19 declaration under Section 564(b)(1) of the Act, 21 U.S.C. section 360bbb-3(b)(1), unless the authorization is terminated or revoked sooner.  Performed at [facility], [address] [place], [place] [address]   Culture, blood (routine x 2)     Status: None   Collection Time: 06/18/20  8:54 PM   Specimen: BLOOD  Result Value Ref Range Status   Specimen Description BLOOD RIGHT FA  Final   Special Requests   Final    BOTTLES DRAWN AEROBIC AND ANAEROBIC Blood Culture adequate volume   Culture   Final    NO GROWTH 5 DAYS Performed at [facility], [address], [place], [place] [address]    Report Status 06/23/2020 FINAL  Final  Culture, blood (routine x 2)     Status: None   Collection Time: 06/18/20  8:54 PM   Specimen: BLOOD  Result Value Ref Range Status   Specimen Description BLOOD LEFT FA  Final   Special Requests   Final    BOTTLES DRAWN AEROBIC AND ANAEROBIC Blood Culture adequate volume   Culture   Final     NO GROWTH 5 DAYS Performed at [facility], [address] [place], [place] [address]    Report Status 06/23/2020 FINAL  Final  Culture, sputum-assessment     Status: None   Collection Time: 06/19/20  7:28 AM   Specimen: Sputum  Result Value Ref Range Status   Specimen Description SPUTUM  Final   Special Requests NONE  Final   Sputum evaluation   Final    THIS SPECIMEN IS ACCEPTABLE FOR SPUTUM CULTURE Performed at [facility], [address] [place], [place] [address]    Report Status 06/19/2020 FINAL  Final  Culture, respiratory     Status: None   Collection Time: 06/19/20  7:28 AM   Specimen: SPU  Result Value Ref Range Status   Specimen Description   Final    SPUTUM Performed at [facility], [address], [place], [place] [address]    Special Requests   Final    NONE Reflexed from [id number] Performed at [facility], [place], [place] [address]    Gram Stain   Final    MODERATE WBC PRESENT,BOTH PMN AND MONONUCLEAR MODERATE GRAM POSITIVE COCCI FEW GRAM VARIABLE ROD RARE YEAST    Culture   Final    FEW Normal respiratory flora-no Staph aureus or Pseudomonas seen Performed at [facility], [place] [address] [place], [place] [address]    Report Status 06/21/2020 FINAL  Final  Surgical pcr screen     Status: None   Collection Time: 06/22/20  3:22 PM   Specimen: Nasal Mucosa; Nasal Swab  Result Value Ref Range Status   MRSA, PCR NEGATIVE NEGATIVE Final   Staphylococcus aureus NEGATIVE NEGATIVE Final    Comment: (NOTE) The Xpert SA Assay (FDA approved for NASAL specimens in patients 93 years of age and older), is one component of a comprehensive surveillance program. It is not intended to diagnose infection nor to guide or monitor treatment. Performed at [facility], [address], [place],  [address]          Radiology Studies: [facility] Chest [place] 1  View  Result Date:  06/23/2020 CLINICAL DATA:  Pneumonia EXAM: PORTABLE CHEST 1 VIEW COMPARISON:  June 18, 2020 chest radiograph and chest CT FINDINGS: Extensive airspace opacity is noted throughout the left upper and left mid lung regions with areas of apparent cavitation. Less discrete infiltrate is noted throughout the remainder of the left lung, stable. There is underlying emphysematous change. Scattered areas of scarring noted on the right without edema or airspace opacity. Heart size and pulmonary vascularity are normal. No adenopathy. No bone lesions. IMPRESSION: Underlying emphysema. Widespread airspace opacity on the left with areas of cavitary pneumonia throughout the left upper and mid lung regions, similar to recent prior studies. Underlying fibrosis elsewhere, most severe left lower lung region. Scattered areas of scarring on the right without edema or airspace opacity. Stable cardiac silhouette. Electronically Signed   By: Lowella Grip III M.D.   On: 06/23/2020 08:37        Scheduled Meds:  aspirin EC  81 mg Oral Daily   clonazePAM  1 mg Oral BID   enoxaparin (LOVENOX) injection  40 mg Subcutaneous Q24H   fluticasone furoate-vilanterol  1 puff Inhalation Daily   guaiFENesin  1,200 mg Oral BID   levothyroxine  25 mcg Oral QAC breakfast   lidocaine  15 mL Oral Once   pantoprazole  40 mg Oral Daily   senna-docusate  1 tablet Oral BID   sucralfate  1 g Oral TID WC & HS   tiotropium  18 mcg Inhalation Daily   umeclidinium bromide  1 puff Inhalation Daily   Continuous Infusions:  ampicillin-sulbactam (UNASYN) IV 3 g (06/25/20 0555)     LOS: 7 days    Time spent: 38 mins    Wyvonnia Dusky, MD [facility] Pager [phone]  If 7PM-7AM, please contact night-coverage [email or web address] 06/25/2020, 8:18 AM

## 2020-06-25 NOTE — Consult Note (Addendum)
Pharmacy Antibiotic Note  Mark [name] is a 69 y.o. male admitted on 06/18/2020 with lung abscess - left lung necrotizing pneumonia with cavitation chronic- mid lung - very likely aspiration. Patient presented with shortness of breath and purulent sputum. PMH includes COPD and chronic respiratory failure on 4L O2, osteoporosis, and asthma. Pharmacy has been consulted for Unasyn dosing.  Day #8 antibiotics (day 4 Unasyn)   Plan: Unasyn 3g Q6H   Height: 6' (182.9 cm) Weight: 81.6 kg (180 lb) IBW/kg (Calculated) : 77.6  Temp (24hrs), Avg:98.1 F (36.7 C), Min:97.7 F (36.5 C), Max:98.4 F (36.9 C)  Recent Labs  Lab 06/18/20 2014 06/18/20 2054 06/19/20 0506 06/19/20 0506 06/20/20 0445 06/21/20 2325 06/22/20 0604 06/23/20 0441 06/23/20 0957 06/25/20 0508  WBC   < >  --  20.3*  --  21.6*  --  22.2*  --  21.7* 15.0*  CREATININE   < >  --  0.72   < > 0.83  --  0.60* 0.71 0.67 0.66  LATICACIDVEN  --  1.1  --   --   --   --   --   --   --   --   VANCOTROUGH  --   --   --   --   --  14*  --   --   --   --    < > = values in this interval not displayed.    Estimated Creatinine Clearance: 95.7 mL/min (by C-G formula based on SCr of 0.66 mg/dL).    No Known Allergies  Antimicrobials this admission: 9/16 vancomycin >> 9/20 9/17 metfronidazole >> 9/20 9/17 ceftriaxone >> 9/19 9/16 cefepime x 1  9/17 Unasyn x 1 9/20 Unasyn >>   Microbiology results: 9/16 BCx: NGTD 9/17 Sputum: pending 9/17 MRSA PCR: negative  Thank you for allowing pharmacy to be a part of this patient's care.  Kristeen Miss, PharmD Clinical Pharmacist 06/25/2020 10:55 AM

## 2020-06-25 NOTE — NC FL2 (Signed)
[place] LEVEL OF CARE SCREENING TOOL     IDENTIFICATION  Patient Name: Mark [name] Birthdate: [birth year] Sex: male Admission Date (Current Location): 06/18/2020  [place] and [place] Number:  Engineering geologist and Address:  [facility], [address] [place], [place] [address]      Provider Number: [id number]  Attending Physician Name and Address:  Wyvonnia Dusky, MD  Relative Name and Phone Number:  Lanice Shirts [phone]    Current Level of Care: Hospital Recommended Level of Care: [place] Prior Approval Number:    Date Approved/Denied:   PASRR Number:    Discharge Plan: Home    Current Diagnoses: Patient Active Problem List   Diagnosis Date Noted  . Abscess of left lung with pneumonia ([place]) 06/19/2020  . Necrotizing pneumonia ([place]) 06/18/2020  . Chest pain 01/17/2019  . Hypothyroidism 01/17/2019  . Hypokalemia 01/10/2017  . Elevated C-reactive protein (CRP) 01/10/2017  . Elevated sed rate 01/10/2017  . Chronic hip pain (Location of Tertiary source of pain) (Bilateral) (L>R) 01/10/2017  . Chronic pain syndrome 10/24/2016  . Chronic back pain (Location of Primary Source of Pain) (Bilateral) (R>L) 10/24/2016  . Disturbance of skin sensation 05/05/2016  . Peripheral neuropathy 05/05/2016  . Chronic knee pain (Bilateral) (L>R) 05/05/2016  . Chronic shoulder pain (Bilateral) (R>L) 05/05/2016  . Opioid-induced constipation (OIC) 05/05/2016  . Restless leg syndrome 05/05/2016  . Mediastinal adenopathy 05/05/2016  . Pulmonary nodule (7 mm right lower lobe) 05/05/2016  . Inguinal hernia without obstruction (Right) 05/05/2016  . 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 Spasm of back muscles 05/05/2016  . Chronic musculoskeletal pain 05/05/2016  . Neurogenic pain 05/05/2016  . Neuropathic pain 05/05/2016  . Sacral back pain (Location of  Secondary source of pain) (Bilateral) (L>R) 05/05/2016  . Logorrhea 05/05/2016  . Chronic lower extremity pain (Bilateral) (L>R) 05/05/2016  . Chronic neck pain (posterior) (L>R) 05/05/2016  . 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 Cervical facet hypertrophy (C2-3) (Bilateral) (L>R) 05/05/2016  . Cervical facet syndrome (Bilateral) (L>R) 05/05/2016  . 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) 05/05/2016  . Compression fracture of L2 ([place]) (seen on 11/25/2014 x-ray) 05/05/2016  . Anemia, chronic disease 05/05/2016  . Lumbar facet syndrome (Bilateral) (R>L) 05/05/2016  . Opiate use (75 MME/Day) 03/14/2016  . Long term prescription opiate use 03/14/2016  . Long term current use of opiate analgesic 03/14/2016  . Long term prescription benzodiazepine use 03/14/2016  . History of marijuana use 03/14/2016  . Compression fractures (L4, L3, L1, T12 and T11) 06/26/2015  . Acquired atrophy of thyroid 06/26/2015  . OP (osteoporosis) 06/26/2015  . Other long term (current) drug therapy 07/12/2013  . Pain medication agreement broken 07/12/2013  . Clinical depression 01/20/2005  . Esophagitis, reflux 06/01/2004  . Current tobacco use 06/01/2004  . Anxiety 05/27/2004  . 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 ([place]) 05/27/2004    Orientation RESPIRATION BLADDER Height & Weight     Self, Time, Situation, Place  O2 (3L) Continent Weight: 81.6 kg Height:  6' (182.9 cm)  BEHAVIORAL SYMPTOMS/MOOD NEUROLOGICAL BOWEL NUTRITION STATUS      Continent Diet (Regular)  AMBULATORY STATUS COMMUNICATION OF NEEDS Skin   Extensive Assist Verbally Normal                       Personal Care Assistance Level of Assistance  Bathing, Feeding, Dressing Bathing Assistance: Limited assistance Feeding assistance: Independent Dressing Assistance: Limited assistance     Functional Limitations Info  Sight, Hearing, Speech Sight  Info: Adequate Hearing Info: Adequate Speech Info: Adequate    SPECIAL CARE FACTORS FREQUENCY  PT (By licensed PT), OT (By licensed OT)                    Contractures Contractures Info: Not present    Additional Factors Info  Code Status, Allergies Code Status Info: DNR Allergies Info: No known allergies           Current Medications (06/25/2020):  This is the current hospital active medication list Current Facility-Administered Medications  Medication Dose Route Frequency Provider Last Rate Last Admin  . albuterol (PROVENTIL) (2.5 MG/3ML) 0.083% nebulizer solution 3 mL  3 mL Inhalation Q4H PRN Lenore Cordia, MD   3 mL at 06/25/20 0152  . alum & mag hydroxide-simeth (MAALOX/MYLANTA) 200-200-20 MG/5ML suspension 30 mL  30 mL Oral Q6H PRN Ottie Glazier, MD       And  . lidocaine (XYLOCAINE) 2 % viscous mouth solution 15 mL  15 mL Oral Once Ottie Glazier, MD      . Ampicillin-Sulbactam (UNASYN) 3 g in sodium chloride 0.9 % 100 mL IVPB  3 g Intravenous Q6H Duncan, Asajah R, RPH 200 mL/hr at 06/25/20 1259 3 g at 06/25/20 1259  . aspirin EC tablet 81 mg  81 mg Oral Daily Nolberto Hanlon, MD   81 mg at 06/25/20 1027  . chlorpheniramine-HYDROcodone (TUSSIONEX) 10-8 MG/5ML suspension 5 mL  5 mL Oral Q4H PRN Max Sane, MD   5 mL at 06/19/20 1630  . clonazePAM (KLONOPIN) tablet 1 mg  1 mg Oral BID Lenore Cordia, MD   1 mg at 06/24/20 2028  . enoxaparin (LOVENOX) injection 40 mg  40 mg Subcutaneous Q24H Zada Finders R, MD   40 mg at 06/22/20 0856  . fluticasone (FLONASE) 50 MCG/ACT nasal spray 2 spray  2 spray Each Nare Daily PRN Nolberto Hanlon, MD      . fluticasone furoate-vilanterol (BREO ELLIPTA) 200-25 MCG/INH 1 puff  1 puff Inhalation Daily Nolberto Hanlon, MD   1 puff at 06/25/20 1028  . guaiFENesin (MUCINEX) 12 hr tablet 1,200 mg  1,200 mg Oral BID Zada Finders R, MD   1,200 mg at 06/25/20 1027  . ipratropium (ATROVENT HFA) inhaler 2 puff  2 puff Inhalation Q4H Wyvonnia Dusky, MD   2 puff at 06/25/20 1029  . levothyroxine (SYNTHROID) tablet 25 mcg  25 mcg Oral QAC breakfast Lenore Cordia, MD   25 mcg at 06/25/20 0531  . pantoprazole (PROTONIX) EC tablet 40 mg  40 mg Oral Daily Wyvonnia Dusky, MD   40 mg at 06/25/20 1027  . polyethylene glycol (MIRALAX / GLYCOLAX) packet 17 g  17 g Oral Daily PRN Nolberto Hanlon, MD   17 g at 06/22/20 1353  . senna-docusate (Senokot-S) tablet 1 tablet  1 tablet Oral BID Nolberto Hanlon, MD   1 tablet at 06/25/20 1027  . sucralfate (CARAFATE) tablet 1 g  1 g Oral TID WC & HS Aleskerov, Fuad, MD   1 g at 06/25/20 1254  . umeclidinium bromide (INCRUSE ELLIPTA) 62.5 MCG/INH 1 puff  1 puff Inhalation Daily Lu Duffel, RPH   1 puff at 06/25/20 1443     Discharge Medications: Please see discharge summary for a list of discharge medications.  Relevant Imaging Results:  Relevant Lab Results:   Additional Information SS# [id number]  Shelbie Ammons, RN

## 2020-06-25 NOTE — Progress Notes (Signed)
   06/25/20 1628  Assess: MEWS Score  Temp (!) 100.4 F (38 C)  BP 119/65  Pulse Rate (!) 119  Resp 16  Level of Consciousness Alert  SpO2 97 %  O2 Device Nasal Cannula  O2 Flow Rate (L/min) 4 L/min  Assess: MEWS Score  MEWS Temp 0  MEWS Systolic 0  MEWS Pulse 2  MEWS RR 0  MEWS LOC 0  MEWS Score 2  MEWS Score Color Yellow  Assess: if the MEWS score is Yellow or Red  Were vital signs taken at a resting state? Yes  Focused Assessment No change from prior assessment  Early Detection of Sepsis Score *See Row Information* Medium  MEWS guidelines implemented *See Row Information* Yes  Treat  MEWS Interventions Administered scheduled meds/treatments  Pain Scale 0-10  Pain Score 0  Pain Type Acute pain  Pain Location Chest  Pain Orientation Mid;Left  Pain Descriptors / Indicators Aching  Pain Frequency Intermittent  Pain Onset Gradual  Pain Intervention(s) Refused (wants something to help sleep)  Take Vital Signs  Increase Vital Sign Frequency  Yellow: Q 2hr X 2 then Q 4hr X 2, if remains yellow, continue Q 4hrs  Escalate  MEWS: Escalate Yellow: discuss with charge nurse/RN and consider discussing with provider and RRT  Notify: Charge Nurse/RN  Name of Charge Nurse/RN Notified Helene Kelp RN  Date Charge Nurse/RN Notified 06/25/20  Time Charge Nurse/RN Notified 1633

## 2020-06-25 NOTE — Progress Notes (Signed)
Objective Swallowing Evaluation: Type of Study: MBS-Modified Barium Swallow Study   Patient Details  Name: Vaibhav Fogleman MRN: [id number] Date of Birth: 1950-11-17  Today's Date: 06/25/2020 Time: SLP Start Time (ACUTE ONLY): 3716 -SLP Stop Time (ACUTE ONLY): 0905  SLP Time Calculation (min) (ACUTE ONLY): 30 min   Past Medical History:      Past Medical History:  Diagnosis Date  . Acute respiratory failure with hypoxia and hypercapnia ([place]) 05/01/2016  . Asthma   . COPD (chronic obstructive pulmonary disease) ([place])   . Osteoporosis    Past Surgical History:       Past Surgical History:  Procedure Laterality Date  . BACK SURGERY     HPI: Mr Champine is a pleasant 69 year old male with a history of chronic pain syndrome with opioid-induced constipation, GERD, advanced COPD chronic hypoxemia on 4 L/min supplemental oxygen home O2.  Also has a background history of hypothyroidism, peripheral neuropathy chronic lumbago, reports worsening respiratory status x2 to 3 months with signs and symptoms of acute COPD exacerbation with increased volume and darkening of phlegm on expectoration.  CT chest shows necrotizing pneumonia with consolidative infiltrate of the left upper and lower lung zones as well as fluid level cystic lesion suggestive of abscess with sorrounding emphysema.     Subjective: pt pleasant, conversant    Assessment / Plan / Recommendation  [facility] IP CLINICAL IMPRESSIONS 06/25/2020  Clinical Impression Pt demonstrated grossly functional oropharyngeal abilities when consuming puree, solids, whole barium tablet, thin liquids via cup and nectar thick liquids via cup. On imaging pt presents with moderate oral phase deficits that are c/b decrased bolus management. However after talking with pt, it appears this is an incidental finding as pt reports this to be "the way I have always chewed." When consuming the above PO intake, penetration and aspiraiton were not observed.  Pt reports that he doesn't have issues with swallow but after he swallows "it feels like Kerosene is going down my esophagus." He reports raw sensation. At this time, pt appears appropriate to continue on current diet. Extensive education provided on oral care (regardless of only having 2 teeth). Skilled ST intervention is not indicated at this time.   SLP Visit Diagnosis Dysphagia, unspecified (R13.10)  Attention and concentration deficit following --  Frontal lobe and executive function deficit following --  Impact on safety and function Mild aspiration risk      [facility] IP TREATMENT RECOMMENDATION 06/25/2020  Treatment Recommendations No treatment recommended at this time     No flowsheet data found.  [facility] IP DIET RECOMMENDATION 06/25/2020  SLP Diet Recommendations Regular solids;Thin liquid  Liquid Administration via Cup  Medication Administration Whole meds with liquid  Compensations Minimize environmental distractions;Slow rate;Small sips/bites  Postural Changes Seated upright at 90 degrees      CH[facility] IP OTHER RECOMMENDATIONS 06/25/2020  Recommended Consults --  Oral Care Recommendations Oral care BID  Other Recommendations --      [facility] IP FOLLOW UP RECOMMENDATIONS 06/25/2020  Follow up Recommendations None      No flowsheet data found.         CHL IP ORAL PHASE 06/25/2020  Oral Phase WFL  Oral - Pudding Teaspoon --  Oral - Pudding Cup --  Oral - Honey Teaspoon --  Oral - Honey Cup --  Oral - Nectar Teaspoon --  Oral - Nectar Cup --  Oral - Nectar Straw --  Oral - Thin Teaspoon --  Oral - Thin Cup --  Oral - Thin  Straw --  Oral - Puree --  Oral - Mech Soft --  Oral - Regular --  Oral - Multi-Consistency --  Oral - Pill --  Oral Phase - Comment --    CH[facility] IP PHARYNGEAL PHASE 06/25/2020  Pharyngeal Phase WFL  Pharyngeal- Pudding Teaspoon --  Pharyngeal --  Pharyngeal- Pudding Cup --  Pharyngeal --  Pharyngeal- Honey Teaspoon --  Pharyngeal --   Pharyngeal- Honey Cup --  Pharyngeal --  Pharyngeal- Nectar Teaspoon --  Pharyngeal --  Pharyngeal- Nectar Cup --  Pharyngeal --  Pharyngeal- Nectar Straw --  Pharyngeal --  Pharyngeal- Thin Teaspoon --  Pharyngeal --  Pharyngeal- Thin Cup --  Pharyngeal --  Pharyngeal- Thin Straw --  Pharyngeal --  Pharyngeal- Puree --  Pharyngeal --  Pharyngeal- Mechanical Soft --  Pharyngeal --  Pharyngeal- Regular --  Pharyngeal --  Pharyngeal- Multi-consistency --  Pharyngeal --  Pharyngeal- Pill --  Pharyngeal --  Pharyngeal Comment --     [facility] IP CERVICAL ESOPHAGEAL PHASE 06/25/2020  Cervical Esophageal Phase WFL  Pudding Teaspoon --  Pudding Cup --  Honey Teaspoon --  Honey Cup --  Nectar Teaspoon --  Nectar Cup --  Nectar Straw --  Thin Teaspoon --  Thin Cup --  Thin Straw --  Puree --  Mechanical Soft --  Regular --  Multi-consistency --  Pill --  Cervical Esophageal Comment --     Timmothy Baranowski 06/25/2020, 1:00 PM

## 2020-06-26 ENCOUNTER — Inpatient Hospital Stay: Payer: Medicare Other

## 2020-06-26 DIAGNOSIS — J189 Pneumonia, unspecified organism: Secondary | ICD-10-CM | POA: Diagnosis not present

## 2020-06-26 DIAGNOSIS — J851 Abscess of lung with pneumonia: Secondary | ICD-10-CM | POA: Diagnosis not present

## 2020-06-26 DIAGNOSIS — J449 Chronic obstructive pulmonary disease, unspecified: Secondary | ICD-10-CM | POA: Diagnosis not present

## 2020-06-26 LAB — BASIC METABOLIC PANEL
Anion gap: 7 (ref 5–15)
BUN: 9 mg/dL (ref 8–23)
CO2: 32 mmol/L (ref 22–32)
Calcium: 7.6 mg/dL — ABNORMAL LOW (ref 8.9–10.3)
Chloride: 97 mmol/L — ABNORMAL LOW (ref 98–111)
Creatinine, Ser: 0.6 mg/dL — ABNORMAL LOW (ref 0.61–1.24)
GFR calc Af Amer: >60 mL/min (ref >60)
GFR calc non Af Amer: >60 mL/min (ref >60)
Glucose, Bld: 107 mg/dL — ABNORMAL HIGH (ref 70–99)
Potassium: 3.7 mmol/L (ref 3.5–5.1)
Sodium: 136 mmol/L (ref 135–145)

## 2020-06-26 LAB — CBC
HCT: 36.5 % — ABNORMAL LOW (ref 39.0–52.0)
Hemoglobin: 11.4 g/dL — ABNORMAL LOW (ref 13.0–17.0)
MCH: 29.2 pg (ref 26.0–34.0)
MCHC: 31.2 g/dL (ref 30.0–36.0)
MCV: 93.6 fL (ref 80.0–100.0)
Platelets: 530 10*3/uL — ABNORMAL HIGH (ref 150–400)
RBC: 3.9 MIL/uL — ABNORMAL LOW (ref 4.22–5.81)
RDW: 14.4 % (ref 11.5–15.5)
WBC: 15.1 10*3/uL — ABNORMAL HIGH (ref 4.0–10.5)
nRBC: 0 % (ref 0.0–0.2)

## 2020-06-26 NOTE — Progress Notes (Signed)
Pulmonary Medicine          Date: 06/26/2020,   MRN# [id number] Mark [name] 05/24/1951     AdmissionWeight: 81.6 kg                 CurrentWeight: 81.6 kg   Referring physician: Dr. Manuella Ghazi   CHIEF COMPLAINT:   Necrotizing pneumonia   HISTORY OF PRESENT ILLNESS   Is a pleasant 69 year old male with a history of chronic pain syndrome with opioid-induced constipation, GERD, advanced COPD chronic hypoxemia on 4 L/min supplemental oxygen home O2.  Also has a background history of hypothyroidism, peripheral neuropathy chronic lumbago, reports worsening respiratory status x2 to 3 months with signs and symptoms of acute COPD exacerbation with increased volume and darkening of phlegm on expectoration.  Most recently in the last 2 weeks prior to admission he developed chills diaphoresis and subjective fevers.  In the ER he was found to have leukocytosis, negative COVID-19 test x2, increased O2 requirement from 4 to 5 L/min nasal cannula however venous blood gas was essentially normal BNP and troponin also within reference range.  CT chest shows necrotizing pneumonia with consolidative infiltrate of the left upper and lower lung zones as well as fluid level cystic lesion suggestive of abscess with sorrounding emphysema.  Pulmonary consultation placed for further evaluation and management of necrotizing pneumonia.  He has crusted swollen erythematous feet with intertrigo, he has distended abdomen, poor dentition, and reports broken back. He shares that he has no family and when asked regarding medical proxy he states "im on my own".  Overall he looks chronically ill.    06/20/20- patient had a good night sleep and now is more lucid, hes drinking coffee.  He is speaking rapidly with cicumferential and tangential language. WBC count is with mild trend up.   06/22/20- patient is DNR/DNI after speaking to palliative today - appreciate collaboration. Sputum with GPCs. Patient requests MDI  inhaler we discussed use of Spiriva and Symbicort.  Mild hypokalemia this am, pharmacy consult for repletion. Repeat CXR in am.    06/23/20- patient appears clincally improved this morning.  CXR this am reviewed by me - left lung cavitary pneumonia and superimposed combined pulmonary fibrosis and emphysema. Patient is on 4L/min [place]  06/24/20-patient is similar as yesterday he complains of post prandial substernal chest discomfort, we discussed potential GERD related pain. He has extensive emphysema and will unlikely be a good candidate for bronchoscopy. We will order spirometry to evaluate candidacy for anesthesia.   06/25/20- patient is slightly imrproved this am.  Leukocytosis finally trending down, and he is coughing up more phlegm thickened brown material possibly aspirated food stuffs. He has GERD will order lidocaine GI coctail and carafate.   06/26/20- patient is on 3L/min, he complains of thrush but throat, lingual surface and upper airway are clear. He had barium study yesterday due to complaints of severe dysphagia but that was also unremarkable. He is speaking in full sentences without desaturation and breathing on auscultation is inmproved  Bilateraly.    PAST MEDICAL HISTORY   Past Medical History:  Diagnosis Date  . Acute respiratory failure with hypoxia and hypercapnia ([place]) 05/01/2016  . Asthma   . COPD (chronic obstructive pulmonary disease) ([place])   . Osteoporosis      SURGICAL HISTORY   Past Surgical History:  Procedure Laterality Date  . BACK SURGERY       FAMILY HISTORY   Family History  Problem Relation Age of Onset  .  Pulmonary fibrosis Mother      SOCIAL HISTORY   Social History   Tobacco Use  . Smoking status: Former Research scientist (life sciences)  . Smokeless tobacco: Never Used  Substance Use Topics  . Alcohol use: No  . Drug use: No     MEDICATIONS    Home Medication:    Current Medication:  Current Facility-Administered Medications:  .  albuterol (PROVENTIL)  (2.5 MG/3ML) 0.083% nebulizer solution 3 mL, 3 mL, Inhalation, Q4H PRN, Lenore Cordia, MD, 3 mL at 06/25/20 0152 .  alum & mag hydroxide-simeth (MAALOX/MYLANTA) 200-200-20 MG/5ML suspension 30 mL, 30 mL, Oral, Q6H PRN **AND** lidocaine (XYLOCAINE) 2 % viscous mouth solution 15 mL, 15 mL, Oral, Once, Joaovictor Krone, MD .  Ampicillin-Sulbactam (UNASYN) 3 g in sodium chloride 0.9 % 100 mL IVPB, 3 g, Intravenous, Q6H, Duncan, Asajah R, RPH, Last Rate: 200 mL/hr at 06/26/20 0013, 3 g at 06/26/20 0013 .  aspirin EC tablet 81 mg, 81 mg, Oral, Daily, Kurtis Bushman, Sahar, MD, 81 mg at 06/25/20 1027 .  chlorpheniramine-HYDROcodone (TUSSIONEX) 10-8 MG/5ML suspension 5 mL, 5 mL, Oral, Q4H PRN, Max Sane, MD, 5 mL at 06/26/20 0512 .  clonazePAM (KLONOPIN) tablet 1 mg, 1 mg, Oral, BID, Zada Finders R, MD, 1 mg at 06/25/20 2328 .  enoxaparin (LOVENOX) injection 40 mg, 40 mg, Subcutaneous, Q24H, Zada Finders R, MD, 40 mg at 06/22/20 0856 .  fluticasone (FLONASE) 50 MCG/ACT nasal spray 2 spray, 2 spray, Each Nare, Daily PRN, Kurtis Bushman, Sahar, MD .  fluticasone furoate-vilanterol (BREO ELLIPTA) 200-25 MCG/INH 1 puff, 1 puff, Inhalation, Daily, Nolberto Hanlon, MD, 1 puff at 06/25/20 1028 .  guaiFENesin (MUCINEX) 12 hr tablet 1,200 mg, 1,200 mg, Oral, BID, Zada Finders R, MD, 1,200 mg at 06/25/20 2327 .  ipratropium (ATROVENT HFA) inhaler 2 puff, 2 puff, Inhalation, Q4H, Wyvonnia Dusky, MD, 2 puff at 06/26/20 [phone] .  levothyroxine (SYNTHROID) tablet 25 mcg, 25 mcg, Oral, QAC breakfast, Lenore Cordia, MD, 25 mcg at 06/26/20 0512 .  pantoprazole (PROTONIX) EC tablet 40 mg, 40 mg, Oral, Daily, Wyvonnia Dusky, MD, 40 mg at 06/25/20 1027 .  polyethylene glycol (MIRALAX / GLYCOLAX) packet 17 g, 17 g, Oral, Daily PRN, Nolberto Hanlon, MD, 17 g at 06/22/20 1353 .  senna-docusate (Senokot-S) tablet 1 tablet, 1 tablet, Oral, BID, Nolberto Hanlon, MD, 1 tablet at 06/25/20 1027 .  sucralfate (CARAFATE) tablet 1 g, 1 g, Oral, TID WC  & HS, Canio Winokur, MD, 1 g at 06/25/20 2327 .  [DISCONTINUED] fluticasone furoate-vilanterol (BREO ELLIPTA) 100-25 MCG/INH 1 puff, 1 puff, Inhalation, Daily, 1 puff at 06/22/20 0856 **AND** umeclidinium bromide (INCRUSE ELLIPTA) 62.5 MCG/INH 1 puff, 1 puff, Inhalation, Daily, Lu Duffel, RPH, 1 puff at 06/25/20 1700    ALLERGIES   Patient has no known allergies.     REVIEW OF SYSTEMS    Review of Systems:  Gen:  Denies  fever, sweats, chills weigh loss  HEENT: Denies blurred vision, double vision, ear pain, eye pain, hearing loss, nose bleeds, sore throat Cardiac:  No dizziness, chest pain or heaviness, chest tightness,edema Resp:   Reports SOB Gi: Denies swallowing difficulty, stomach pain, nausea or vomiting, diarrhea, constipation, bowel incontinence Gu:  Denies bladder incontinence, burning urine Ext:   Denies Joint pain, stiffness or swelling Skin: Denies  skin rash, easy bruising or bleeding or hives Endoc:  Denies polyuria, polydipsia , polyphagia or weight change Psych:   Denies depression, insomnia or hallucinations   Other:  All other systems negative   VS: BP 120/70 (BP Location: Right Arm)   Pulse 99   Temp 98.6 F (37 C)   Resp 20   Ht 6' (1.829 m)   Wt 81.6 kg   SpO2 94%   BMI 24.41 kg/m      PHYSICAL EXAM    GENERAL:NAD, no fevers, chills, no weakness no fatigue HEAD: Normocephalic, atraumatic.  EYES: Pupils equal, round, reactive to light. Extraocular muscles intact. No scleral icterus.  MOUTH: Moist mucosal membrane. Dentition intact. No abscess noted.  EAR, NOSE, THROAT: Clear without exudates. No external lesions.  NECK: Supple. No thyromegaly. No nodules. No JVD.  PULMONARY:rhonchi bilaterally  CARDIOVASCULAR: S1 and S2. Regular rate and rhythm. No murmurs, rubs, or gallops. No edema. Pedal pulses 2+ bilaterally.  GASTROINTESTINAL: Soft, nontender, nondistended. No masses. Positive bowel sounds. No hepatosplenomegaly.    MUSCULOSKELETAL: No swelling, clubbing, or edema. Range of motion full in all extremities.  NEUROLOGIC: Cranial nerves II through XII are intact. No gross focal neurological deficits. Sensation intact. Reflexes intact.  SKIN: crusted feet with erythemaouts intertrigo. PSYCHIATRIC: Mood, affect within normal limits. The patient is awake, alert and oriented x 3. Insight, judgment intact.       IMAGING    CT Chest W Contrast  Result Date: 06/18/2020 CLINICAL DATA:  69 year old male with shortness of breath, productive cough, pneumonia. On home oxygen. EXAM: CT CHEST WITH CONTRAST TECHNIQUE: Multidetector CT imaging of the chest was performed during intravenous contrast administration. CONTRAST:  51mL OMNIPAQUE IOHEXOL 300 MG/ML  SOLN COMPARISON:  Portable chest earlier today.  Chest CT 01/17/2020. FINDINGS: Cardiovascular: No cardiomegaly or pericardial effusion. Calcified coronary artery and aortic atherosclerosis. The central vascular structures of the mediastinum are enhancing and appear to be patent. Mediastinum/Nodes: Mildly increased in reactive appearing mediastinal lymph nodes compared to 2020, up to 14 mm short axis. Some nodes (right paratracheal on series 2, image 47) have not significantly changed. Additionally, there is evidence of a larger 15 mm short axis left AP window node. Lungs/Pleura: Chronic pulmonary hyperinflation. Extensive chronic emphysema. Previous bullous emphysema in the left upper lung where now there is extensive consolidation with numerous small rounded areas of opacity suspicious for cavitation. There is a prominent fluid level lateral to the left hilum on series 3, image 65 suspicious for lung abscess. Abnormal interstitial thickening and peribronchial nodularity throughout the left lung. No superimposed pneumothorax. Only trace left pleural fluid. The major airways remain patent. Right lung emphysema and markings appear stable since last year. Upper Abdomen: Negative  visible liver, gallbladder, spleen, pancreas, adrenal glands, kidneys, and bowel in the upper abdomen. Musculoskeletal: Chronic compression fractures and ankylosis in the spine, with several levels of previous vertebral body augmentation, appears stable since last year. Occasional chronic rib fractures. No acute osseous abnormality identified. No chest wall abnormality. IMPRESSION: 1. 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 Consider necrotizing pneumonia as multiple areas are suspicious for cavitation, and especially a fluid-level lateral to the left hilum (series 2, image 68) strongly suggesting Lung Abscess. 2. Right lung unaffected.  Only trace left pleural fluid. 3. Reactive appearing mediastinal lymph nodes. 4. Calcified coronary artery and Aortic Atherosclerosis (ICD10-I70.0). Emphysema (ICD10-J43.9). Electronically Signed   By: Genevie Ann M.D.   On: 06/18/2020 22:14   DG Chest Port 1 View  Result Date: 06/26/2020 CLINICAL DATA:  Shortness of breath. EXAM: PORTABLE CHEST 1 VIEW COMPARISON:  June 23, 2020. FINDINGS: The heart size and mediastinal contours are within  normal limits. No pneumothorax or pleural effusion is noted. Stable diffuse left lung opacities are noted consistent with pneumonia. Stable right basilar opacity is noted concerning for worsening atelectasis or pneumonia. The visualized skeletal structures are unremarkable. IMPRESSION: Stable bilateral lung opacities are noted consistent with multifocal pneumonia. Electronically Signed   By: Marijo Conception M.D.   On: 06/26/2020 08:57   DG Chest Port 1 View  Result Date: 06/23/2020 CLINICAL DATA:  Pneumonia EXAM: PORTABLE CHEST 1 VIEW COMPARISON:  June 18, 2020 chest radiograph and chest CT FINDINGS: Extensive airspace opacity is noted throughout the left upper and left mid lung regions with areas of apparent cavitation. Less discrete infiltrate is noted throughout the remainder of the left lung, stable.  There is underlying emphysematous change. Scattered areas of scarring noted on the right without edema or airspace opacity. Heart size and pulmonary vascularity are normal. No adenopathy. No bone lesions. IMPRESSION: Underlying emphysema. Widespread airspace opacity on the left with areas of cavitary pneumonia throughout the left upper and mid lung regions, similar to recent prior studies. Underlying fibrosis elsewhere, most severe left lower lung region. Scattered areas of scarring on the right without edema or airspace opacity. Stable cardiac silhouette. Electronically Signed   By: Lowella Grip III M.D.   On: 06/23/2020 08:37   DG Chest Portable 1 View  Result Date: 06/18/2020 CLINICAL DATA:  Shortness of breath all summer lung, worsening last week, productive cough, congestion EXAM: PORTABLE CHEST 1 VIEW COMPARISON:  CT 01/17/2019, radiograph 01/17/2019 FINDINGS: There is a background chronic reticular and fibrotic interstitial changes throughout both lungs albeit with new area of superimposed consolidative opacity spanning the left mid lung to apex including an area of potential cavitation seen more peripherally. No discernible pneumothorax or visible effusion. Cardiomediastinal contours are partially obscured by overlying opacity with the visible borders similar in appearance to the comparison studies from prior. No acute osseous or soft tissue abnormality. Degenerative changes are present in the imaged spine and shoulders. Telemetry leads overlie the chest. IMPRESSION: Consolidative opacity in the left mid lung to apex with some questionable cavitation peripherally. Recommend further characterization with CT of the chest with contrast if patient is able to tolerate. Findings superimposed on chronic reticular and fibrotic interstitial changes. These results were called by telephone at the time of interpretation on 06/18/2020 at 8:45 pm to provider [facility] , who verbally acknowledged these  results. Electronically Signed   By: Lovena Le M.D.   On: 06/18/2020 20:45   DG Swallowing Func-Speech Pathology  Result Date: 06/25/2020 Objective Swallowing Evaluation: Type of Study: MBS-Modified Barium Swallow Study  Patient Details Name: Mark [name] MRN: [id number] Date of Birth: 03-05-1951 Today's Date: 06/25/2020 Time: SLP Start Time (ACUTE ONLY): 8280 -SLP Stop Time (ACUTE ONLY): 0905 SLP Time Calculation (min) (ACUTE ONLY): 30 min Past Medical History: Past Medical History: Diagnosis Date . Acute respiratory failure with hypoxia and hypercapnia ([place]) 05/01/2016 . Asthma  . COPD (chronic obstructive pulmonary disease) ([place])  . Osteoporosis  Past Surgical History: Past Surgical History: Procedure Laterality Date . BACK SURGERY   HPI: Mr Kilgallon is a pleasant 69 year old male with a history of chronic pain syndrome with opioid-induced constipation, GERD, advanced COPD chronic hypoxemia on 4 L/min supplemental oxygen home O2.  Also has a background history of hypothyroidism, peripheral neuropathy chronic lumbago, reports worsening respiratory status x2 to 3 months with signs and symptoms of acute COPD exacerbation with increased volume and darkening of phlegm on expectoration.  CT chest  shows necrotizing pneumonia with consolidative infiltrate of the left upper and lower lung zones as well as fluid level cystic lesion suggestive of abscess with sorrounding emphysema.   Subjective: pt pleasant, conversant Assessment / Plan / Recommendation CHL IP CLINICAL IMPRESSIONS 06/25/2020 Clinical Impression Pt demonstrated grossly functional oropharyngeal abilities when consuming puree, solids, whole barium tablet, thin liquids via cup and nectar thick liquids via cup. On imaging pt presents with moderate oral phase deficits that are c/b decrased bolus management. However after talking with pt, it appears this is an incidental finding as pt reports this to be "the way I have always chewed." When consuming the above  PO intake, penetration and aspiraiton were not observed. Pt reports that he doesn't have issues with swallow but after he swallows "it feels like Kerosene is going down my esophagus." He reports raw sensation. At this time, pt appears appropriate to continue on current diet. Extensive education provided on oral care (regardless of only having 2 teeth). Skilled ST intervention is not indicated at this time.  SLP Visit Diagnosis Dysphagia, unspecified (R13.10) Attention and concentration deficit following -- Frontal lobe and executive function deficit following -- Impact on safety and function Mild aspiration risk   CHL IP TREATMENT RECOMMENDATION 06/25/2020 Treatment Recommendations No treatment recommended at this time   No flowsheet data found. CHL IP DIET RECOMMENDATION 06/25/2020 SLP Diet Recommendations Regular solids;Thin liquid Liquid Administration via Cup Medication Administration Whole meds with liquid Compensations Minimize environmental distractions;Slow rate;Small sips/bites Postural Changes Seated upright at 90 degrees   CHL IP OTHER RECOMMENDATIONS 06/25/2020 Recommended Consults -- Oral Care Recommendations Oral care BID Other Recommendations --   CHL IP FOLLOW UP RECOMMENDATIONS 06/25/2020 Follow up Recommendations None   No flowsheet data found.     CHL IP ORAL PHASE 06/25/2020 Oral Phase WFL Oral - Pudding Teaspoon -- Oral - Pudding Cup -- Oral - Honey Teaspoon -- Oral - Honey Cup -- Oral - Nectar Teaspoon -- Oral - Nectar Cup -- Oral - Nectar Straw -- Oral - Thin Teaspoon -- Oral - Thin Cup -- Oral - Thin Straw -- Oral - Puree -- Oral - Mech Soft -- Oral - Regular -- Oral - Multi-Consistency -- Oral - Pill -- Oral Phase - Comment --  CHL IP PHARYNGEAL PHASE 06/25/2020 Pharyngeal Phase WFL Pharyngeal- Pudding Teaspoon -- Pharyngeal -- Pharyngeal- Pudding Cup -- Pharyngeal -- Pharyngeal- Honey Teaspoon -- Pharyngeal -- Pharyngeal- Honey Cup -- Pharyngeal -- Pharyngeal- Nectar Teaspoon -- Pharyngeal --  Pharyngeal- Nectar Cup -- Pharyngeal -- Pharyngeal- Nectar Straw -- Pharyngeal -- Pharyngeal- Thin Teaspoon -- Pharyngeal -- Pharyngeal- Thin Cup -- Pharyngeal -- Pharyngeal- Thin Straw -- Pharyngeal -- Pharyngeal- Puree -- Pharyngeal -- Pharyngeal- Mechanical Soft -- Pharyngeal -- Pharyngeal- Regular -- Pharyngeal -- Pharyngeal- Multi-consistency -- Pharyngeal -- Pharyngeal- Pill -- Pharyngeal -- Pharyngeal Comment --  CHL IP CERVICAL ESOPHAGEAL PHASE 06/25/2020 Cervical Esophageal Phase WFL Pudding Teaspoon -- Pudding Cup -- Honey Teaspoon -- Honey Cup -- Nectar Teaspoon -- Nectar Cup -- Nectar Straw -- Thin Teaspoon -- Thin Cup -- Thin Straw -- Puree -- Mechanical Soft -- Regular -- Multi-consistency -- Pill -- Cervical Esophageal Comment -- Happi Overton 06/25/2020, 1:00 PM                 ASSESSMENT/PLAN   Left lung necrortizing pneumonia with abscess -patient with poor dentition possible anaerobic etiology -will send off sputum cultures patient states he can cough phlegm -he is altered with encephalopathy -sputum culture x 3->>>> +GPCs  -  AFB sputum-negatve -histo/strep pneumo/legionella urine ag -blood cultures-neg to date -MRSA nasal PCR -procalcitonin trend -defer antimicrobial regimen to ID - currently on Unasyn>>rocephin +flagyl +vancomycin>>>Unasyn  -poor prognosis - palliative evaluation     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)  - chronic hypoxemia with increased O2 requirement  -his emphysema is beyond repair and patient has very poor prognosis -typical COPD carepath with duoneb, bronchopulmonary hygiene, steroids and antibiotics is appropriate for inpatient therapy   Altered mental status with confusion   - possible septic encephalitis, patient admits to drinking alcohol  - VBG does not support hypercarbic encephalopathy  -patient does not lack capacity and has had psychiatric evaluation    Lower extermity foot wounds  - wound care  And  podiatry evaluation - possible  Cellulitis/intertrigo    GERD with aspiration  lidocaine malox GI cocktail q6hprn -carafate and protonix -sp Barium study with speech and swallow -  No worrisome findings   Thank you for allowing me to participate in the care of this patient.   Patient/Family are satisfied with care plan and all questions have been answered.  This document was prepared using Dragon voice recognition software and may include unintentional dictation errors.     Ottie Glazier, M.D.  Division of [place]

## 2020-06-26 NOTE — Progress Notes (Signed)
PT Cancellation Note  Patient Details Name: Mark [name] MRN: [id number] DOB: 10-16-1950   Cancelled Treatment:      PT attempt. Pt refused. States, " I have a million reason why I can't do it right now." Will continue efforts to treat per pt willingness.    [name] 06/26/2020, 10:56 AM

## 2020-06-26 NOTE — Progress Notes (Signed)
Physical Therapy Treatment Patient Details Name: Mark [name] MRN: [id number] DOB: [birth year] Today's Date: 06/26/2020    History of Present Illness Mark [name] is a 69 yo male with onset of fever, PN back pain and increased work of breathing was diagnosed with COPD exacerbation, leukocytosis, and emphysema was noted to have necrotizing PNA.  PMHx:  asthma, COPD, acute respiratory failure, osteoporosis, 4L O2 continually avoiding falling at home    PT Comments    Pt was long sitting in bed on 4 L o2 upon arriving. He agrees to session after max encouragement. Pt restine sao2 94% that dropped to 84% and required increased O2 demand to 6 L. RN aware. He was able to exit L side of bed with min assist. Stand to RW with min assist prior to ambulating to recliner with CGA. Pt overall tolerated session well. Liked to dictate session progressing but overall more willing to perform desired task. He was sitting in recliner at conclusion of session with call bell in reach, chair alarm in place, and RN aware of pt's increased O2 demand. PT will continue to follow per POC. Pt will benefit from skilled PT at SNF when medically ready to DC from hospital.      Follow Up Recommendations  SNF     Equipment Recommendations  Rolling walker with 5" wheels    Recommendations for Other Services       Precautions / Restrictions Precautions Precautions: Fall Precaution Comments: monitor use of O2    Mobility  Bed Mobility Overal bed mobility: Needs Assistance Bed Mobility: Supine to Sit     Supine to sit: Min assist     General bed mobility comments: min assist to progress from long sit in bed to short sit EOB  Transfers Overall transfer level: Needs assistance Equipment used: Rolling walker (2 wheeled) Transfers: Sit to/from Stand Sit to Stand: Min assist;From elevated surface         General transfer comment: pt was able to stand from slightly elevated bed height 2 x prior to  ambulating to recliner.  Ambulation/Gait Ambulation/Gait assistance: Min guard   Assistive device: Rolling walker (2 wheeled) Gait Pattern/deviations: WFL(Within Functional Limits) Gait velocity: decreased   General Gait Details: pt on 4 L o2 throughout. limited distance 2/2 to pt's willingness         Cognition Arousal/Alertness: Awake/alert Behavior During Therapy: WFL for tasks assessed/performed Overall Cognitive Status: No family/caregiver present to determine baseline cognitive functioning          General Comments: self directs therapy session               PT Goals (current goals can now be found in the care plan section) Acute Rehab PT Goals Patient Stated Goal: to return to PLOF    Frequency    Min 2X/week      PT Plan Current plan remains appropriate       AM-PAC PT "6 Clicks" Mobility   Outcome Measure  Help needed turning from your back to your side while in a flat bed without using bedrails?: A Little Help needed moving from lying on your back to sitting on the side of a flat bed without using bedrails?: A Little Help needed moving to and from a bed to a chair (including a wheelchair)?: A Lot Help needed standing up from a chair using your arms (e.g., wheelchair or bedside chair)?: A Lot Help needed to walk in hospital room?: A Lot Help needed climbing 3-5 steps with  a railing? : A Lot 6 Click Score: 14    End of Session         PT Visit Diagnosis: Unsteadiness on feet (R26.81);Repeated falls (R29.6);Muscle weakness (generalized) (M62.81);History of falling (Z91.81)     Time: 1430-1455 PT Time Calculation (min) (ACUTE ONLY): 25 min  Charges:  $Therapeutic Activity: 23-37 mins                     Julaine Fusi PTA 06/26/20, 4:40 PM

## 2020-06-26 NOTE — Progress Notes (Signed)
Pt sitting in chair. Pt's IV leaking, RN removed IV and got a new IV access. Scheduled meds given. Pt denies any pain and denies any further needs at this time. Call bell within reach

## 2020-06-26 NOTE — Progress Notes (Signed)
° °  Date of Admission:  06/18/2020     ID: Mark [name] is a 69 y.o. 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)   Hypokalemia   Hypothyroidism   Abscess of left lung with pneumonia (HCC)    Subjective: Feeling okay But says no energy  Is not participating in PT due to low oxygen  Medications:   aspirin EC  81 mg Oral Daily   clonazePAM  1 mg Oral BID   enoxaparin (LOVENOX) injection  40 mg Subcutaneous Q24H   fluticasone furoate-vilanterol  1 puff Inhalation Daily   guaiFENesin  1,200 mg Oral BID   ipratropium  2 puff Inhalation Q4H   levothyroxine  25 mcg Oral QAC breakfast   lidocaine  15 mL Oral Once   pantoprazole  40 mg Oral Daily   senna-docusate  1 tablet Oral BID   sucralfate  1 g Oral TID WC & HS   umeclidinium bromide  1 puff Inhalation Daily    Objective: Vital signs in last 24 hours: Temp:  [98.3 F (36.8 C)-100.4 F (38 C)] 98.6 F (37 C) (09/24 0750) Pulse Rate:  [99-119] 99 (09/24 0750) Resp:  [16-20] 20 (09/24 0750) BP: (107-130)/(57-70) 120/70 (09/24 0750) SpO2:  [94 %-100 %] 94 % (09/24 0750)  PHYSICAL EXAM:  General: Alert, cooperative, no distress, appears stated age.  Head: Normocephalic, without obvious abnormality, atraumatic. Eyes: Conjunctivae clear, anicteric sclerae. Pupils are equal ENT Nares normal. No drainage or sinus tenderness. Lips, mucosa, and tongue normal. No Thrush Neck: Supple, symmetrical, no adenopathy, thyroid: non tender no carotid bruit and no JVD. Back: No CVA tenderness. Lungs: b/l air entry- rhonchi and crepts Heart: Regular rate and rhythm, no murmur, rub or gallop. Abdomen: Soft, non-tender,not distended. Bowel sounds normal. No masses Extremities feet erythema resolved- scaling skin Skin: No rashes or lesions. Or bruising Lymph: Cervical, supraclavicular normal. Neurologic: Grossly  non-focal  Lab Results Recent Labs    06/25/20 0508 06/26/20 0445  WBC 15.0* 15.1*  HGB 12.1* 11.4*  HCT 37.4* 36.5*  NA 138 136  K 3.4* 3.7  CL 96* 97*  CO2 33* 32  BUN 11 9  CREATININE 0.66 0.60*   Liver Panel Recent Labs    06/23/20 0957  PROT 5.7*  ALBUMIN 2.1*  AST 13*  ALT 12  ALKPHOS 97  BILITOT 0.7   Sedimentation Rate No results for input(s): ESRSEDRATE in the last 72 hours. C-Reactive Protein No results for input(s): CRP in the last 72 hours.  Microbiology:  Studies/Results:   Assessment/Plan:  Left lung necrotizing pneumonia with lung abscess- chronic- unasyn-  Aspiration/poor dentition Sputum culture neg- on unasyn -leucocytosis responding Bronchoscopy/BALmay be needed if he does not respond to unasyn  to  to r.o atypical organisms   COPD  Discussed the management with the patient and pulmonologist ID will follow him peripherally this weekend-call if needed

## 2020-06-26 NOTE — Plan of Care (Signed)
  Problem: Education: Goal: Knowledge of General Education information will improve Description: Including pain rating scale, medication(s)/side effects and non-pharmacologic comfort measures Outcome: Progressing   Problem: Health Behavior/Discharge Planning: Goal: Ability to manage health-related needs will improve Outcome: Progressing   Problem: Clinical Measurements: Goal: Cardiovascular complication will be avoided Outcome: Progressing   Problem: Activity: Goal: Risk for activity intolerance will decrease Outcome: Progressing   Problem: Nutrition: Goal: Adequate nutrition will be maintained Outcome: Progressing   Problem: Elimination: Goal: Will not experience complications related to bowel motility Outcome: Progressing Goal: Will not experience complications related to urinary retention Outcome: Progressing   Problem: Pain Managment: Goal: General experience of comfort will improve Outcome: Progressing   Problem: Safety: Goal: Ability to remain free from injury will improve Outcome: Progressing   Problem: Skin Integrity: Goal: Risk for impaired skin integrity will decrease Outcome: Progressing

## 2020-06-26 NOTE — Consult Note (Signed)
Pharmacy Antibiotic Note  Mark [name] is a 69 y.o. male admitted on 06/18/2020 with lung abscess - left lung necrotizing pneumonia with cavitation chronic- mid lung - very likely aspiration. Patient presented with shortness of breath and purulent sputum. PMH includes COPD and chronic respiratory failure on 4L O2, osteoporosis, and asthma. Pharmacy has been consulted for Unasyn dosing.  Day #9 antibiotics (day 5 Unasyn)   Plan: Continue Unasyn 3g IV every 6 hours.    Height: 6' (182.9 cm) Weight: 81.6 kg (180 lb) IBW/kg (Calculated) : 77.6  Temp (24hrs), Avg:99 F (37.2 C), Min:98.3 F (36.8 C), Max:100.4 F (38 C)  Recent Labs  Lab 06/20/20 0445 06/20/20 0445 06/21/20 2325 06/22/20 0604 06/23/20 0441 06/23/20 0957 06/25/20 0508 06/26/20 0445  WBC 21.6*  --   --  22.2*  --  21.7* 15.0* 15.1*  CREATININE 0.83   < >  --  0.60* 0.71 0.67 0.66 0.60*  VANCOTROUGH  --   --  14*  --   --   --   --   --    < > = values in this interval not displayed.    Estimated Creatinine Clearance: 95.7 mL/min (A) (by C-G formula based on SCr of 0.6 mg/dL (L)).    No Known Allergies  Antimicrobials this admission: 9/16 vancomycin >> 9/20 9/17 metfronidazole >> 9/20 9/17 ceftriaxone >> 9/19 9/16 cefepime x 1  9/17 Unasyn x 1 9/20 Unasyn >>   Microbiology results: 9/16 BCx: NGTD 9/17 Sputum: pending 9/17 MRSA PCR: negative  Thank you for allowing pharmacy to be a part of this patient's care.  Pernell Dupre, PharmD, BCPS Clinical Pharmacist 06/26/2020 9:57 AM

## 2020-06-26 NOTE — Progress Notes (Signed)
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 Present on admission, seen on CT chest. Likely chronic as per pt's hx. Continue on IV unasyn as per ID. AFB is neg. Strept is neg. Patient has capacity to consent as per psych &  will need close follow-up with psychiatry as outpatient. Poor prognosis.   COPD: severe & progressive. Continue on IV abxs, bronchodilators. Encourage incentive spirometry. Pulmon following and recs apprec. Poor prognosis  Chronic hypoxic respiratory failure: continue on supplemental oxygen, currently on 4L [place] which is pt's baseline. Encourage incentive spirometry   Thrombocytosis: likely reactive. Will continue to monitor   Leukocytosis: likely secondary to pneumonia. Continue on IV abxs  Constipation: continue on colace, senna, & miralax  Anxiety: severity unknown. Continue on home dose of klonopin   Hypokalemia: WNL today. Will continue to monitor   Onychomycosis & PVD: of b/l LEs. S/p bedside nail debridement as per podiatry. Clean feet w/ normal sterile saline as per podiatry. .  Likely GERD: continue on pantoprazole     DVT prophylaxis: lovenox  Code Status: DNR Family Communication:  Disposition Plan: PT is rec SNF  Status is: Inpatient  Remains inpatient appropriate because:IV treatments appropriate due to intensity of illness or inability to take PO, CM working on SNF placement    Dispo: The patient is from: Home              Anticipated d/c is to: SNF               Anticipated d/c date is: 3 days              Patient currently is  not medically stable to d/c.     Consultants:   Pulmon  ID   Procedures:   Antimicrobials: unasyn   Subjective: Pt c/o productive cough.    Objective: Vitals:   06/25/20 2047 06/26/20 0045 06/26/20 0449 06/26/20 0750  BP: 122/62 (!) 107/57 129/66 120/70  Pulse: (!) 110 (!) 105 (!) 104 99  Resp: 17 17 16 20   Temp: 98.6 F (37 C) 98.3 F (36.8 C) 98.8 F (37.1 C) 98.6 F (37 C)  TempSrc: Oral Oral Oral   SpO2: 94% 97% 98% 94%  Weight:      Height:        Intake/Output Summary (Last 24 hours) at 06/26/2020 0804 Last data filed at 06/26/2020 0450 Gross per 24 hour  Intake 183.74 ml  Output 1200 ml  Net -1016.26 ml   Filed Weights   06/18/20 2006  Weight: 81.6 kg    Examination:  General exam: Appears frustrated. Disheveled  Respiratory system: course breath sounds b/l  Cardiovascular system: S1 & S2 +. No rubs, gallops or clicks.  Gastrointestinal system: Abd is soft, non-tender, non-distended & hypoactive bowel sounds  Central nervous system: Alert and oriented. Moves all 4 extremities  Psychiatry: Judgement and insight appear normal. Appears frustrated     Data Reviewed: I have personally reviewed following labs and imaging studies  CBC: Recent Labs  Lab 06/20/20 0445 06/22/20 0604 06/23/20 0957 06/25/20 0508 06/26/20 0445  WBC 21.6* 22.2* 21.7*  15.0* 15.1*  NEUTROABS  --   --  18.9*  --   --   HGB 13.0 13.5 13.4 12.1* 11.4*  HCT 39.1 41.2 42.1 37.4* 36.5*  MCV 89.7 90.0 91.1 89.9 93.6  PLT 511* 430* 483* 501* 948*   Basic Metabolic Panel: Recent Labs  Lab 06/22/20 0604 06/23/20 0441 06/23/20 0957 06/25/20 0508 06/26/20 0445  NA 138 137 135 138 136  K 3.2* 3.4* 3.6 3.4* 3.7  CL 95* 93* 93* 96* 97*  CO2 33* 34* 34* 33* 32  GLUCOSE 121* 133* 142* 102* 107*  BUN 12 11 13 11 9   CREATININE 0.60* 0.71 0.67 0.66 0.60*  CALCIUM 8.1* 8.1* 8.0* 7.8* 7.6*  MG  --   --  2.1  --   --   PHOS  --   --  2.6  --   --    GFR: Estimated  Creatinine Clearance: 95.7 mL/min (A) (by C-G formula based on SCr of 0.6 mg/dL (L)). Liver Function Tests: Recent Labs  Lab 06/23/20 0957  AST 13*  ALT 12  ALKPHOS 97  BILITOT 0.7  PROT 5.7*  ALBUMIN 2.1*   No results for input(s): LIPASE, AMYLASE in the last 168 hours. No results for input(s): AMMONIA in the last 168 hours. Coagulation Profile: No results for input(s): INR, PROTIME in the last 168 hours. Cardiac Enzymes: No results for input(s): CKTOTAL, CKMB, CKMBINDEX, TROPONINI in the last 168 hours. BNP (last 3 results) No results for input(s): PROBNP in the last 8760 hours. HbA1C: No results for input(s): HGBA1C in the last 72 hours. CBG: No results for input(s): GLUCAP in the last 168 hours. Lipid Profile: No results for input(s): CHOL, HDL, LDLCALC, TRIG, CHOLHDL, LDLDIRECT in the last 72 hours. Thyroid Function Tests: No results for input(s): TSH, T4TOTAL, FREET4, T3FREE, THYROIDAB in the last 72 hours. Anemia Panel: No results for input(s): VITAMINB12, FOLATE, FERRITIN, TIBC, IRON, RETICCTPCT in the last 72 hours. Sepsis Labs: Recent Labs  Lab 06/21/20 0400 06/23/20 0957 06/24/20 0437 06/25/20 0508  PROCALCITON 0.43 0.30 0.26 0.17    Recent Results (from the past 240 hour(s))  SARS Coronavirus 2 by RT PCR (hospital order, performed in [facility] hospital lab) Nasopharyngeal Nasopharyngeal Swab     Status: None   Collection Time: 06/18/20  8:53 PM   Specimen: Nasopharyngeal Swab  Result Value Ref Range Status   SARS Coronavirus 2 NEGATIVE NEGATIVE Final    Comment: (NOTE) SARS-CoV-2 target nucleic acids are NOT DETECTED.  The SARS-CoV-2 RNA is generally detectable in upper and lower respiratory specimens during the acute phase of infection. The lowest concentration of SARS-CoV-2 viral copies this assay can detect is 250 copies / mL. A negative result does not preclude SARS-CoV-2 infection and should not be used as the sole basis for treatment or  other patient management decisions.  A negative result may occur with improper specimen collection / handling, submission of specimen other than nasopharyngeal swab, presence of viral mutation(s) within the areas targeted by this assay, and inadequate number of viral copies (<250 copies / mL). A negative result must be combined with clinical observations, patient history, and epidemiological information.  Fact Sheet for Patients:   StrictlyIdeas.no  Fact Sheet for Healthcare Providers: BankingDealers.co.za  This test is not yet approved or  cleared by the [place] FDA and has been authorized for detection and/or diagnosis of SARS-CoV-2 by FDA under an Emergency Use Authorization (EUA).  This EUA will remain in effect (meaning this test  can be used) for the duration of the COVID-19 declaration under Section 564(b)(1) of the Act, 21 U.S.C. section 360bbb-3(b)(1), unless the authorization is terminated or revoked sooner.  Performed at [facility], [address] [place], [place] [address]   Culture, blood (routine x 2)     Status: None   Collection Time: 06/18/20  8:54 PM   Specimen: BLOOD  Result Value Ref Range Status   Specimen Description BLOOD RIGHT FA  Final   Special Requests   Final    BOTTLES DRAWN AEROBIC AND ANAEROBIC Blood Culture adequate volume   Culture   Final    NO GROWTH 5 DAYS Performed at [facility], [address], [place], [place] [address]    Report Status 06/23/2020 FINAL  Final  Culture, blood (routine x 2)     Status: None   Collection Time: 06/18/20  8:54 PM   Specimen: BLOOD  Result Value Ref Range Status   Specimen Description BLOOD LEFT FA  Final   Special Requests   Final    BOTTLES DRAWN AEROBIC AND ANAEROBIC Blood Culture adequate volume   Culture   Final    NO GROWTH 5 DAYS Performed at [facility], [address] [place], [place] [address]    Report  Status 06/23/2020 FINAL  Final  Culture, sputum-assessment     Status: None   Collection Time: 06/19/20  7:28 AM   Specimen: Sputum  Result Value Ref Range Status   Specimen Description SPUTUM  Final   Special Requests NONE  Final   Sputum evaluation   Final    THIS SPECIMEN IS ACCEPTABLE FOR SPUTUM CULTURE Performed at [facility], [address], [place], [place] [address]    Report Status 06/19/2020 FINAL  Final  Culture, respiratory     Status: None   Collection Time: 06/19/20  7:28 AM   Specimen: SPU  Result Value Ref Range Status   Specimen Description   Final    SPUTUM Performed at [facility], [address] [place], [place] [address]    Special Requests   Final    NONE Reflexed from [id number] Performed at [facility], [place], [place] [address]    Gram Stain   Final    MODERATE WBC PRESENT,BOTH PMN AND MONONUCLEAR MODERATE GRAM POSITIVE COCCI FEW GRAM VARIABLE ROD RARE YEAST    Culture   Final    FEW Normal respiratory flora-no Staph aureus or Pseudomonas seen Performed at [facility], [place] [address] [place], [place] [address]    Report Status 06/21/2020 FINAL  Final  Surgical pcr screen     Status: None   Collection Time: 06/22/20  3:22 PM   Specimen: Nasal Mucosa; Nasal Swab  Result Value Ref Range Status   MRSA, PCR NEGATIVE NEGATIVE Final   Staphylococcus aureus NEGATIVE NEGATIVE Final    Comment: (NOTE) The Xpert SA Assay (FDA approved for NASAL specimens in patients 5 years of age and older), is one component of a comprehensive surveillance program. It is not intended to diagnose infection nor to guide or monitor treatment. Performed at [facility], [address] [place],  [address]          Radiology Studies: DG Swallowing [facility] Pathology  Result Date: 06/25/2020 Objective Swallowing Evaluation: Type of Study: MBS-Modified Barium Swallow Study  Patient Details  Name: Amadeo Coke MRN: [id number] Date of Birth: Jan 01, 1951 Today's Date: 06/25/2020 Time: SLP Start Time (  ACUTE ONLY): [id number] -SLP Stop Time (ACUTE ONLY): 0905 SLP Time Calculation (min) (ACUTE ONLY): 30 min Past Medical History: Past Medical History: Diagnosis Date . Acute respiratory failure with hypoxia and hypercapnia ([place]) 05/01/2016 . Asthma  . COPD (chronic obstructive pulmonary disease) ([place])  . Osteoporosis  Past Surgical History: Past Surgical History: Procedure Laterality Date . BACK SURGERY   HPI: Mr Atkison is a pleasant 69 year old male with a history of chronic pain syndrome with opioid-induced constipation, GERD, advanced COPD chronic hypoxemia on 4 L/min supplemental oxygen home O2.  Also has a background history of hypothyroidism, peripheral neuropathy chronic lumbago, reports worsening respiratory status x2 to 3 months with signs and symptoms of acute COPD exacerbation with increased volume and darkening of phlegm on expectoration.  CT chest shows necrotizing pneumonia with consolidative infiltrate of the left upper and lower lung zones as well as fluid level cystic lesion suggestive of abscess with sorrounding emphysema.   Subjective: pt pleasant, conversant Assessment / Plan / Recommendation CHL IP CLINICAL IMPRESSIONS 06/25/2020 Clinical Impression Pt demonstrated grossly functional oropharyngeal abilities when consuming puree, solids, whole barium tablet, thin liquids via cup and nectar thick liquids via cup. On imaging pt presents with moderate oral phase deficits that are c/b decrased bolus management. However after talking with pt, it appears this is an incidental finding as pt reports this to be "the way I have always chewed." When consuming the above PO intake, penetration and aspiraiton were not observed. Pt reports that he doesn't have issues with swallow but after he swallows "it feels like Kerosene is going down my esophagus." He reports raw sensation. At this time, pt appears  appropriate to continue on current diet. Extensive education provided on oral care (regardless of only having 2 teeth). Skilled ST intervention is not indicated at this time.  SLP Visit Diagnosis Dysphagia, unspecified (R13.10) Attention and concentration deficit following -- Frontal lobe and executive function deficit following -- Impact on safety and function Mild aspiration risk   CHL IP TREATMENT RECOMMENDATION 06/25/2020 Treatment Recommendations No treatment recommended at this time   No flowsheet data found. CHL IP DIET RECOMMENDATION 06/25/2020 SLP Diet Recommendations Regular solids;Thin liquid Liquid Administration via Cup Medication Administration Whole meds with liquid Compensations Minimize environmental distractions;Slow rate;Small sips/bites Postural Changes Seated upright at 90 degrees   CHL IP OTHER RECOMMENDATIONS 06/25/2020 Recommended Consults -- Oral Care Recommendations Oral care BID Other Recommendations --   CHL IP FOLLOW UP RECOMMENDATIONS 06/25/2020 Follow up Recommendations None   No flowsheet data found.     CHL IP ORAL PHASE 06/25/2020 Oral Phase WFL Oral - Pudding Teaspoon -- Oral - Pudding Cup -- Oral - Honey Teaspoon -- Oral - Honey Cup -- Oral - Nectar Teaspoon -- Oral - Nectar Cup -- Oral - Nectar Straw -- Oral - Thin Teaspoon -- Oral - Thin Cup -- Oral - Thin Straw -- Oral - Puree -- Oral - Mech Soft -- Oral - Regular -- Oral - Multi-Consistency -- Oral - Pill -- Oral Phase - Comment --  CHL IP PHARYNGEAL PHASE 06/25/2020 Pharyngeal Phase WFL Pharyngeal- Pudding Teaspoon -- Pharyngeal -- Pharyngeal- Pudding Cup -- Pharyngeal -- Pharyngeal- Honey Teaspoon -- Pharyngeal -- Pharyngeal- Honey Cup -- Pharyngeal -- Pharyngeal- Nectar Teaspoon -- Pharyngeal -- Pharyngeal- Nectar Cup -- Pharyngeal -- Pharyngeal- Nectar Straw -- Pharyngeal -- Pharyngeal- Thin Teaspoon -- Pharyngeal -- Pharyngeal- Thin Cup -- Pharyngeal -- Pharyngeal- Thin Straw -- Pharyngeal -- Pharyngeal- Puree -- Pharyngeal --  Pharyngeal- Mechanical Soft -- Pharyngeal --  Pharyngeal- Regular -- Pharyngeal -- Pharyngeal- Multi-consistency -- Pharyngeal -- Pharyngeal- Pill -- Pharyngeal -- Pharyngeal Comment --  CHL IP CERVICAL ESOPHAGEAL PHASE 06/25/2020 Cervical Esophageal Phase WFL Pudding Teaspoon -- Pudding Cup -- Honey Teaspoon -- Honey Cup -- Nectar Teaspoon -- Nectar Cup -- Nectar Straw -- Thin Teaspoon -- Thin Cup -- Thin Straw -- Puree -- Mechanical Soft -- Regular -- Multi-consistency -- Pill -- Cervical Esophageal Comment -- Happi Overton 06/25/2020, 1:00 PM                   Scheduled Meds: . aspirin EC  81 mg Oral Daily  . clonazePAM  1 mg Oral BID  . enoxaparin (LOVENOX) injection  40 mg Subcutaneous Q24H  . fluticasone furoate-vilanterol  1 puff Inhalation Daily  . guaiFENesin  1,200 mg Oral BID  . ipratropium  2 puff Inhalation Q4H  . levothyroxine  25 mcg Oral QAC breakfast  . lidocaine  15 mL Oral Once  . pantoprazole  40 mg Oral Daily  . senna-docusate  1 tablet Oral BID  . sucralfate  1 g Oral TID WC & HS  . umeclidinium bromide  1 puff Inhalation Daily   Continuous Infusions: . ampicillin-sulbactam (UNASYN) IV 3 g (06/26/20 0013)     LOS: 8 days    Time spent: 35 mins    Wyvonnia Dusky, MD [facility] Pager [phone]  If 7PM-7AM, please contact night-coverage [email or web address] 06/26/2020, 8:04 AM

## 2020-06-26 NOTE — Progress Notes (Signed)
Ch visited with Pt as per RN's request. Ch introduced self, and Pt announced putting his palm up, "Have a nice day, I am not about to start any conversations now." Ch asked if she can do anything else, Pt said no. Ch left.

## 2020-06-26 NOTE — Progress Notes (Signed)
Pt sitting in bed. RN gave scheduled meds. Pt c/o about not wanting to take meds long term since he "wasn't going to get better". RN encouraged him to be optimistic since pt wants to get better, RN also suggested a visit from Ch. Pt denied and said this RN didn't know anything that RN wasn't a Dr. Abbott Pao denied assistance with oral care, and denied Ch visit. Pt denies any further needs at this time, call bell within reach and Rn number on pt's board

## 2020-06-27 LAB — BASIC METABOLIC PANEL
Anion gap: 7 (ref 5–15)
BUN: 9 mg/dL (ref 8–23)
CO2: 32 mmol/L (ref 22–32)
Calcium: 7.9 mg/dL — ABNORMAL LOW (ref 8.9–10.3)
Chloride: 94 mmol/L — ABNORMAL LOW (ref 98–111)
Creatinine, Ser: 0.7 mg/dL (ref 0.61–1.24)
GFR calc Af Amer: >60 mL/min (ref >60)
GFR calc non Af Amer: >60 mL/min (ref >60)
Glucose, Bld: 128 mg/dL — ABNORMAL HIGH (ref 70–99)
Potassium: 3.6 mmol/L (ref 3.5–5.1)
Sodium: 133 mmol/L — ABNORMAL LOW (ref 135–145)

## 2020-06-27 LAB — CBC
HCT: 35.3 % — ABNORMAL LOW (ref 39.0–52.0)
Hemoglobin: 11.4 g/dL — ABNORMAL LOW (ref 13.0–17.0)
MCH: 29.2 pg (ref 26.0–34.0)
MCHC: 32.3 g/dL (ref 30.0–36.0)
MCV: 90.5 fL (ref 80.0–100.0)
Platelets: 559 10*3/uL — ABNORMAL HIGH (ref 150–400)
RBC: 3.9 MIL/uL — ABNORMAL LOW (ref 4.22–5.81)
RDW: 14 % (ref 11.5–15.5)
WBC: 14.5 10*3/uL — ABNORMAL HIGH (ref 4.0–10.5)
nRBC: 0 % (ref 0.0–0.2)

## 2020-06-27 NOTE — Plan of Care (Signed)

## 2020-06-27 NOTE — TOC Progression Note (Signed)
Transition of Care [facility]) - Progression Note    Patient Details  Name: Mark [name] MRN: [id number] Date of Birth: 01-21-1951  Transition of [facility]) CM/SW Contact  Boris Sharper, LCSW Phone Number: 06/27/2020, 9:39 AM  Clinical Narrative:    CSW contacted Tanya with [facility] about pt admitting today. Lavella Lemons stated that there are currently no beds available in the facility.    Expected Discharge Plan: [place] Barriers to Discharge: Continued Medical Work up  Expected Discharge Plan and Services Expected Discharge Plan: [place]   Discharge Planning Services: CM Consult Post Acute Care Choice: [place] arrangements for the past 2 months: Single Family Home                           HH Arranged: RN, PT, OT, Nurse's Aide, Social Work [employer] Agency: [place] ([place]) Date [place]: 06/24/20 Time [place]: [id number] Representative spoke with at [place]: [place] ([place]) Interventions    Readmission Risk Interventions No flowsheet data found.

## 2020-06-27 NOTE — Progress Notes (Signed)
Pulmonary Medicine          Date: 06/27/2020,   MRN# [id number] Mark [name] 07-09-1951     AdmissionWeight: 81.6 kg                 CurrentWeight: 81.6 kg   Referring physician: Dr. Manuella Ghazi   CHIEF COMPLAINT:   Necrotizing pneumonia   HISTORY OF PRESENT ILLNESS   Is a pleasant 69 year old male with a history of chronic pain syndrome with opioid-induced constipation, GERD, advanced COPD chronic hypoxemia on 4 L/min supplemental oxygen home O2.  Also has a background history of hypothyroidism, peripheral neuropathy chronic lumbago, reports worsening respiratory status x2 to 3 months with signs and symptoms of acute COPD exacerbation with increased volume and darkening of phlegm on expectoration.  Most recently in the last 2 weeks prior to admission he developed chills diaphoresis and subjective fevers.  In the ER he was found to have leukocytosis, negative COVID-19 test x2, increased O2 requirement from 4 to 5 L/min nasal cannula however venous blood gas was essentially normal BNP and troponin also within reference range.  CT chest shows necrotizing pneumonia with consolidative infiltrate of the left upper and lower lung zones as well as fluid level cystic lesion suggestive of abscess with sorrounding emphysema.  Pulmonary consultation placed for further evaluation and management of necrotizing pneumonia.  He has crusted swollen erythematous feet with intertrigo, he has distended abdomen, poor dentition, and reports broken back. He shares that he has no family and when asked regarding medical proxy he states "im on my own".  Overall he looks chronically ill.    06/20/20- patient had a good night sleep and now is more lucid, hes drinking coffee.  He is speaking rapidly with cicumferential and tangential language. WBC count is with mild trend up.   06/22/20- patient is DNR/DNI after speaking to palliative today - appreciate collaboration. Sputum with GPCs. Patient requests MDI  inhaler we discussed use of Spiriva and Symbicort.  Mild hypokalemia this am, pharmacy consult for repletion. Repeat CXR in am.    06/23/20- patient appears clincally improved this morning.  CXR this am reviewed by me - left lung cavitary pneumonia and superimposed combined pulmonary fibrosis and emphysema. Patient is on 4L/min [place]  06/24/20-patient is similar as yesterday he complains of post prandial substernal chest discomfort, we discussed potential GERD related pain. He has extensive emphysema and will unlikely be a good candidate for bronchoscopy. We will order spirometry to evaluate candidacy for anesthesia.   06/25/20- patient is slightly imrproved this am.  Leukocytosis finally trending down, and he is coughing up more phlegm thickened brown material possibly aspirated food stuffs. He has GERD will order lidocaine GI coctail and carafate.   06/26/20- patient is on 3L/min, he complains of thrush but throat, lingual surface and upper airway are clear. He had barium study yesterday due to complaints of severe dysphagia but that was also unremarkable. He is speaking in full sentences without desaturation and breathing on auscultation is inmproved  Bilateraly.   06/27/20- patient is more conversive, hes saturating >95% on 4L can wean down as able. He is coughing up more brown debris, I will recollect resp culture.  Left and right sided auscultation is imrpoved.    PAST MEDICAL HISTORY   Past Medical History:  Diagnosis Date   Acute respiratory failure with hypoxia and hypercapnia (HCC) 05/01/2016   Asthma    COPD (chronic obstructive pulmonary disease) (HCC)    Osteoporosis  SURGICAL HISTORY   Past Surgical History:  Procedure Laterality Date   BACK SURGERY       FAMILY HISTORY   Family History  Problem Relation Age of Onset   Pulmonary fibrosis Mother      SOCIAL HISTORY   Social History   Tobacco Use   Smoking status: Former Smoker   Smokeless tobacco: Never  Used  Substance Use Topics   Alcohol use: No   Drug use: No     MEDICATIONS    Home Medication:    Current Medication:  Current Facility-Administered Medications:    albuterol (PROVENTIL) (2.5 MG/3ML) 0.083% nebulizer solution 3 mL, 3 mL, Inhalation, Q4H PRN, Zada Finders R, MD, 3 mL at 06/25/20 0152   alum & mag hydroxide-simeth (MAALOX/MYLANTA) 200-200-20 MG/5ML suspension 30 mL, 30 mL, Oral, Q6H PRN **AND** lidocaine (XYLOCAINE) 2 % viscous mouth solution 15 mL, 15 mL, Oral, Once, Adrieana Fennelly, MD   Ampicillin-Sulbactam (UNASYN) 3 g in sodium chloride 0.9 % 100 mL IVPB, 3 g, Intravenous, Q6H, Duncan, Asajah R, RPH, Last Rate: 200 mL/hr at 06/27/20 0838, 3 g at 06/27/20 5053   aspirin EC tablet 81 mg, 81 mg, Oral, Daily, Amery, Sahar, MD, 81 mg at 06/26/20 1033   chlorpheniramine-HYDROcodone (TUSSIONEX) 10-8 MG/5ML suspension 5 mL, 5 mL, Oral, Q4H PRN, Max Sane, MD, 5 mL at 06/27/20 0508   clonazePAM (KLONOPIN) tablet 1 mg, 1 mg, Oral, BID, Zada Finders R, MD, 1 mg at 06/26/20 2228   enoxaparin (LOVENOX) injection 40 mg, 40 mg, Subcutaneous, Q24H, Patel, Vishal R, MD, 40 mg at 06/22/20 0856   fluticasone (FLONASE) 50 MCG/ACT nasal spray 2 spray, 2 spray, Each Nare, Daily PRN, Kurtis Bushman, Sahar, MD   fluticasone furoate-vilanterol (BREO ELLIPTA) 200-25 MCG/INH 1 puff, 1 puff, Inhalation, Daily, Amery, Sahar, MD, 1 puff at 06/26/20 1041   guaiFENesin (MUCINEX) 12 hr tablet 1,200 mg, 1,200 mg, Oral, BID, Posey Pronto, Vishal R, MD, 1,200 mg at 06/26/20 2228   ipratropium (ATROVENT HFA) inhaler 2 puff, 2 puff, Inhalation, Q4H, Wyvonnia Dusky, MD, 2 puff at 06/27/20 0510   levothyroxine (SYNTHROID) tablet 25 mcg, 25 mcg, Oral, QAC breakfast, Zada Finders R, MD, 25 mcg at 06/27/20 0508   pantoprazole (PROTONIX) EC tablet 40 mg, 40 mg, Oral, Daily, Jimmye Norman, Jamiese M, MD, 40 mg at 06/26/20 1034   polyethylene glycol (MIRALAX / GLYCOLAX) packet 17 g, 17 g, Oral, Daily PRN,  Kurtis Bushman, Gwynneth Albright, MD, 17 g at 06/22/20 1353   senna-docusate (Senokot-S) tablet 1 tablet, 1 tablet, Oral, BID, Kurtis Bushman, Sahar, MD, 1 tablet at 06/26/20 1033   sucralfate (CARAFATE) tablet 1 g, 1 g, Oral, TID WC & HS, Farrel Guimond, MD, 1 g at 06/26/20 1800   [DISCONTINUED] fluticasone furoate-vilanterol (BREO ELLIPTA) 100-25 MCG/INH 1 puff, 1 puff, Inhalation, Daily, 1 puff at 06/22/20 0856 **AND** umeclidinium bromide (INCRUSE ELLIPTA) 62.5 MCG/INH 1 puff, 1 puff, Inhalation, Daily, Lu Duffel, RPH, 1 puff at 06/26/20 1043    ALLERGIES   Patient has no known allergies.     REVIEW OF SYSTEMS    Review of Systems:  Gen:  Denies  fever, sweats, chills weigh loss  HEENT: Denies blurred vision, double vision, ear pain, eye pain, hearing loss, nose bleeds, sore throat Cardiac:  No dizziness, chest pain or heaviness, chest tightness,edema Resp:   Reports SOB Gi: Denies swallowing difficulty, stomach pain, nausea or vomiting, diarrhea, constipation, bowel incontinence Gu:  Denies bladder incontinence, burning urine Ext:   Denies Joint pain, stiffness  or swelling Skin: Denies  skin rash, easy bruising or bleeding or hives Endoc:  Denies polyuria, polydipsia , polyphagia or weight change Psych:   Denies depression, insomnia or hallucinations   Other:  All other systems negative   VS: BP (!) 122/52 (BP Location: Right Arm)    Pulse 96    Temp 97.7 F (36.5 C) (Oral)    Resp 17    Ht 6' (1.829 m)    Wt 81.6 kg    SpO2 94%    BMI 24.41 kg/m      PHYSICAL EXAM    GENERAL:NAD, no fevers, chills, no weakness no fatigue HEAD: Normocephalic, atraumatic.  EYES: Pupils equal, round, reactive to light. Extraocular muscles intact. No scleral icterus.  MOUTH: Moist mucosal membrane. Dentition intact. No abscess noted.  EAR, NOSE, THROAT: Clear without exudates. No external lesions.  NECK: Supple. No thyromegaly. No nodules. No JVD.  PULMONARY:rhonchi bilaterally  CARDIOVASCULAR:  S1 and S2. Regular rate and rhythm. No murmurs, rubs, or gallops. No edema. Pedal pulses 2+ bilaterally.  GASTROINTESTINAL: Soft, nontender, nondistended. No masses. Positive bowel sounds. No hepatosplenomegaly.  MUSCULOSKELETAL: No swelling, clubbing, or edema. Range of motion full in all extremities.  NEUROLOGIC: Cranial nerves II through XII are intact. No gross focal neurological deficits. Sensation intact. Reflexes intact.  SKIN: crusted feet with erythemaouts intertrigo. PSYCHIATRIC: Mood, affect within normal limits. The patient is awake, alert and oriented x 3. Insight, judgment intact.       IMAGING    CT Chest W Contrast  Result Date: 06/18/2020 CLINICAL DATA:  69 year old male with shortness of breath, productive cough, pneumonia. On home oxygen. EXAM: CT CHEST WITH CONTRAST TECHNIQUE: Multidetector CT imaging of the chest was performed during intravenous contrast administration. CONTRAST:  68mL OMNIPAQUE IOHEXOL 300 MG/ML  SOLN COMPARISON:  Portable chest earlier today.  Chest CT 01/17/2020. FINDINGS: Cardiovascular: No cardiomegaly or pericardial effusion. Calcified coronary artery and aortic atherosclerosis. The central vascular structures of the mediastinum are enhancing and appear to be patent. Mediastinum/Nodes: Mildly increased in reactive appearing mediastinal lymph nodes compared to 2020, up to 14 mm short axis. Some nodes (right paratracheal on series 2, image 47) have not significantly changed. Additionally, there is evidence of a larger 15 mm short axis left AP window node. Lungs/Pleura: Chronic pulmonary hyperinflation. Extensive chronic emphysema. Previous bullous emphysema in the left upper lung where now there is extensive consolidation with numerous small rounded areas of opacity suspicious for cavitation. There is a prominent fluid level lateral to the left hilum on series 3, image 65 suspicious for lung abscess. Abnormal interstitial thickening and peribronchial  nodularity throughout the left lung. No superimposed pneumothorax. Only trace left pleural fluid. The major airways remain patent. Right lung emphysema and markings appear stable since last year. Upper Abdomen: Negative visible liver, gallbladder, spleen, pancreas, adrenal glands, kidneys, and bowel in the upper abdomen. Musculoskeletal: Chronic compression fractures and ankylosis in the spine, with several levels of previous vertebral body augmentation, appears stable since last year. Occasional chronic rib fractures. No acute osseous abnormality identified. No chest wall abnormality. IMPRESSION: 1. 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 Consider necrotizing pneumonia as multiple areas are suspicious for cavitation, and especially a fluid-level lateral to the left hilum (series 2, image 68) strongly suggesting Lung Abscess. 2. Right lung unaffected.  Only trace left pleural fluid. 3. Reactive appearing mediastinal lymph nodes. 4. Calcified coronary artery and Aortic Atherosclerosis (ICD10-I70.0). Emphysema (ICD10-J43.9). Electronically Signed   By: Lemmie Evens  [place] Crane M.D.   On: 06/18/2020 22:14   DG Chest Port 1 View  Result Date: 06/26/2020 CLINICAL DATA:  Shortness of breath. EXAM: PORTABLE CHEST 1 VIEW COMPARISON:  June 23, 2020. FINDINGS: The heart size and mediastinal contours are within normal limits. No pneumothorax or pleural effusion is noted. Stable diffuse left lung opacities are noted consistent with pneumonia. Stable right basilar opacity is noted concerning for worsening atelectasis or pneumonia. The visualized skeletal structures are unremarkable. IMPRESSION: Stable bilateral lung opacities are noted consistent with multifocal pneumonia. Electronically Signed   By: Marijo Conception M.D.   On: 06/26/2020 08:57   DG Chest Port 1 View  Result Date: 06/23/2020 CLINICAL DATA:  Pneumonia EXAM: PORTABLE CHEST 1 VIEW COMPARISON:  June 18, 2020 chest radiograph and chest  CT FINDINGS: Extensive airspace opacity is noted throughout the left upper and left mid lung regions with areas of apparent cavitation. Less discrete infiltrate is noted throughout the remainder of the left lung, stable. There is underlying emphysematous change. Scattered areas of scarring noted on the right without edema or airspace opacity. Heart size and pulmonary vascularity are normal. No adenopathy. No bone lesions. IMPRESSION: Underlying emphysema. Widespread airspace opacity on the left with areas of cavitary pneumonia throughout the left upper and mid lung regions, similar to recent prior studies. Underlying fibrosis elsewhere, most severe left lower lung region. Scattered areas of scarring on the right without edema or airspace opacity. Stable cardiac silhouette. Electronically Signed   By: Lowella Grip III M.D.   On: 06/23/2020 08:37   DG Chest Portable 1 View  Result Date: 06/18/2020 CLINICAL DATA:  Shortness of breath all summer lung, worsening last week, productive cough, congestion EXAM: PORTABLE CHEST 1 VIEW COMPARISON:  CT 01/17/2019, radiograph 01/17/2019 FINDINGS: There is a background chronic reticular and fibrotic interstitial changes throughout both lungs albeit with new area of superimposed consolidative opacity spanning the left mid lung to apex including an area of potential cavitation seen more peripherally. No discernible pneumothorax or visible effusion. Cardiomediastinal contours are partially obscured by overlying opacity with the visible borders similar in appearance to the comparison studies from prior. No acute osseous or soft tissue abnormality. Degenerative changes are present in the imaged spine and shoulders. Telemetry leads overlie the chest. IMPRESSION: Consolidative opacity in the left mid lung to apex with some questionable cavitation peripherally. Recommend further characterization with CT of the chest with contrast if patient is able to tolerate. Findings  superimposed on chronic reticular and fibrotic interstitial changes. These results were called by telephone at the time of interpretation on 06/18/2020 at 8:45 pm to provider [facility] , who verbally acknowledged these results. Electronically Signed   By: Lovena Le M.D.   On: 06/18/2020 20:45   DG Swallowing Func-Speech Pathology  Result Date: 06/25/2020 Objective Swallowing Evaluation: Type of Study: MBS-Modified Barium Swallow Study  Patient Details Name: Jeovanny Cuadros MRN: [id number] Date of Birth: [birth year] Today's Date: 06/25/2020 Time: SLP Start Time (ACUTE ONLY): 9150 -SLP Stop Time (ACUTE ONLY): 0905 SLP Time Calculation (min) (ACUTE ONLY): 30 min Past Medical History: Past Medical History: Diagnosis Date  Acute respiratory failure with hypoxia and hypercapnia (HCC) 05/01/2016  Asthma   COPD (chronic obstructive pulmonary disease) ([place])   Osteoporosis  Past Surgical History: Past Surgical History: Procedure Laterality Date  BACK SURGERY   HPI: Mr Riano is a pleasant 69 year old male with a history of chronic pain syndrome with opioid-induced constipation, GERD, advanced COPD chronic hypoxemia on 4 L/min supplemental  oxygen home O2.  Also has a background history of hypothyroidism, peripheral neuropathy chronic lumbago, reports worsening respiratory status x2 to 3 months with signs and symptoms of acute COPD exacerbation with increased volume and darkening of phlegm on expectoration.  CT chest shows necrotizing pneumonia with consolidative infiltrate of the left upper and lower lung zones as well as fluid level cystic lesion suggestive of abscess with sorrounding emphysema.   Subjective: pt pleasant, conversant Assessment / Plan / Recommendation CHL IP CLINICAL IMPRESSIONS 06/25/2020 Clinical Impression Pt demonstrated grossly functional oropharyngeal abilities when consuming puree, solids, whole barium tablet, thin liquids via cup and nectar thick liquids via cup. On imaging pt presents with  moderate oral phase deficits that are c/b decrased bolus management. However after talking with pt, it appears this is an incidental finding as pt reports this to be "the way I have always chewed." When consuming the above PO intake, penetration and aspiraiton were not observed. Pt reports that he doesn't have issues with swallow but after he swallows "it feels like Kerosene is going down my esophagus." He reports raw sensation. At this time, pt appears appropriate to continue on current diet. Extensive education provided on oral care (regardless of only having 2 teeth). Skilled ST intervention is not indicated at this time.  SLP Visit Diagnosis Dysphagia, unspecified (R13.10) Attention and concentration deficit following -- Frontal lobe and executive function deficit following -- Impact on safety and function Mild aspiration risk   CHL IP TREATMENT RECOMMENDATION 06/25/2020 Treatment Recommendations No treatment recommended at this time   No flowsheet data found. CHL IP DIET RECOMMENDATION 06/25/2020 SLP Diet Recommendations Regular solids;Thin liquid Liquid Administration via Cup Medication Administration Whole meds with liquid Compensations Minimize environmental distractions;Slow rate;Small sips/bites Postural Changes Seated upright at 90 degrees   CHL IP OTHER RECOMMENDATIONS 06/25/2020 Recommended Consults -- Oral Care Recommendations Oral care BID Other Recommendations --   CHL IP FOLLOW UP RECOMMENDATIONS 06/25/2020 Follow up Recommendations None   No flowsheet data found.     CHL IP ORAL PHASE 06/25/2020 Oral Phase WFL Oral - Pudding Teaspoon -- Oral - Pudding Cup -- Oral - Honey Teaspoon -- Oral - Honey Cup -- Oral - Nectar Teaspoon -- Oral - Nectar Cup -- Oral - Nectar Straw -- Oral - Thin Teaspoon -- Oral - Thin Cup -- Oral - Thin Straw -- Oral - Puree -- Oral - Mech Soft -- Oral - Regular -- Oral - Multi-Consistency -- Oral - Pill -- Oral Phase - Comment --  CHL IP PHARYNGEAL PHASE 06/25/2020 Pharyngeal Phase  WFL Pharyngeal- Pudding Teaspoon -- Pharyngeal -- Pharyngeal- Pudding Cup -- Pharyngeal -- Pharyngeal- Honey Teaspoon -- Pharyngeal -- Pharyngeal- Honey Cup -- Pharyngeal -- Pharyngeal- Nectar Teaspoon -- Pharyngeal -- Pharyngeal- Nectar Cup -- Pharyngeal -- Pharyngeal- Nectar Straw -- Pharyngeal -- Pharyngeal- Thin Teaspoon -- Pharyngeal -- Pharyngeal- Thin Cup -- Pharyngeal -- Pharyngeal- Thin Straw -- Pharyngeal -- Pharyngeal- Puree -- Pharyngeal -- Pharyngeal- Mechanical Soft -- Pharyngeal -- Pharyngeal- Regular -- Pharyngeal -- Pharyngeal- Multi-consistency -- Pharyngeal -- Pharyngeal- Pill -- Pharyngeal -- Pharyngeal Comment --  CHL IP CERVICAL ESOPHAGEAL PHASE 06/25/2020 Cervical Esophageal Phase WFL Pudding Teaspoon -- Pudding Cup -- Honey Teaspoon -- Honey Cup -- Nectar Teaspoon -- Nectar Cup -- Nectar Straw -- Thin Teaspoon -- Thin Cup -- Thin Straw -- Puree -- Mechanical Soft -- Regular -- Multi-consistency -- Pill -- Cervical Esophageal Comment -- Happi Overton 06/25/2020, 1:00 PM  ASSESSMENT/PLAN   Left lung necrortizing pneumonia with abscess -patient with poor dentition possible anaerobic etiology -will send off sputum cultures patient states he can cough phlegm -he is altered with encephalopathy -sputum culture x 3->>>> +GPCs  -AFB sputum-negatve -histo/strep pneumo/legionella urine ag -blood cultures-neg to date -MRSA nasal PCR -procalcitonin trend -defer antimicrobial regimen to ID - currently on Unasyn>>rocephin +flagyl +vancomycin>>>Unasyn  -poor prognosis - palliative evaluation     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)  - chronic hypoxemia with increased O2 requirement  -his emphysema is beyond repair and patient has very poor prognosis -typical COPD carepath with duoneb, bronchopulmonary hygiene, steroids and antibiotics is appropriate for inpatient therapy   Altered mental status with confusion   - possible septic  encephalitis, patient admits to drinking alcohol  - VBG does not support hypercarbic encephalopathy  -patient does not lack capacity and has had psychiatric evaluation    Lower extermity foot wounds  - wound care  And podiatry evaluation - possible  Cellulitis/intertrigo    GERD with aspiration  lidocaine malox GI cocktail q6hprn -carafate and protonix -sp Barium study with speech and swallow -  No worrisome findings   Thank you for allowing me to participate in the care of this patient.   Patient/Family are satisfied with care plan and all questions have been answered.  This document was prepared using Dragon voice recognition software and may include unintentional dictation errors.     Ottie Glazier, M.D.  Division of [place]

## 2020-06-27 NOTE — Progress Notes (Signed)
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 Repeat CXR shows stable b/l infiltrates. Present on admission. Likely chronic as per pt's hx. Continue on IV unasyn as per ID. AFB is neg. Strept is neg. Patient has capacity to consent as per psych & will need close follow-up with psychiatry as outpatient. Poor prognosis.   COPD: severe & progressive. Continue on IV abxs, bronchodilators. Encourage incentive spirometry. Pulmon following and recs apprec. Poor prognosis  Chronic hypoxic respiratory failure: continue on supplemental oxygen, currently on 4L [place] which is pt's baseline. Encourage incentive spirometry   Thrombocytosis: likely reactive. Will continue to monitor   Leukocytosis: likely secondary to pneumonia. Continue on IV abxs  Constipation: continue on stool softner, laxatives   Anxiety: severity unknown. Continue on home dose of klonopin   Hypokalemia: within normal limits today.   Onychomycosis & PVD: of b/l LEs. S/p bedside nail debridement as per podiatry. Clean feet w/ normal sterile saline as per podiatry. .  Likely GERD: continue on PPI    DVT prophylaxis: lovenox  Code Status: DNR Family Communication:  Disposition Plan: PT recs SNF  Status is: Inpatient  Remains inpatient appropriate because:IV treatments appropriate due to intensity of illness or inability to take PO, CM working on SNF placement but pt asked about hospice today. Hospice is unavailable over the weekend    Dispo: The patient is from: Home              Anticipated d/c is to: SNF                Anticipated d/c date is: 3 days              Patient currently is not medically stable to d/c.     Consultants:   Pulmon  ID   Procedures:   Antimicrobials: unasyn   Subjective: Pt c/o malaise     Objective: Vitals:   06/26/20 0750 06/26/20 1533 06/27/20 0008 06/27/20 0856  BP: 120/70 126/83 120/62 (!) 122/52  Pulse: 99 (!) 105 (!) 105 96  Resp: 20 18 18 17   Temp: 98.6 F (37 C) 98.4 F (36.9 C) 98.6 F (37 C) 97.7 F (36.5 C)  TempSrc:   Oral Oral  SpO2: 94% 95% 93% 94%  Weight:      Height:        Intake/Output Summary (Last 24 hours) at 06/27/2020 1252 Last data filed at 06/27/2020 0900 Gross per 24 hour  Intake --  Output 725 ml  Net -725 ml   Filed Weights   06/18/20 2006  Weight: 81.6 kg    Examination:  General exam: Appears calm & comfortable. Disheveled Respiratory system: course breath sounds b/l. Rhonchi b/l  Cardiovascular system: S1 & S2 +. No rubs, gallops or clicks.  Gastrointestinal system: abd is soft, non-tender, non-distended & normal bowel sounds  Central nervous system: Alert and oriented. Moves all 4 extremities  Psychiatry: Judgement and insight appear normal. Tangential thinking     Data Reviewed: I have personally reviewed following labs and imaging studies  CBC: Recent Labs  Lab 06/22/20 0604 06/23/20 0957 06/25/20 4193 06/26/20 0445  06/27/20 0657  WBC 22.2* 21.7* 15.0* 15.1* 14.5*  NEUTROABS  --  18.9*  --   --   --   HGB 13.5 13.4 12.1* 11.4* 11.4*  HCT 41.2 42.1 37.4* 36.5* 35.3*  MCV 90.0 91.1 89.9 93.6 90.5  PLT 430* 483* 501* 530* 973*   Basic Metabolic Panel: Recent Labs  Lab 06/23/20 0441 06/23/20 0957 06/25/20 0508 06/26/20 0445 06/27/20 0657  NA 137 135 138 136 133*  K 3.4* 3.6 3.4* 3.7 3.6  CL 93* 93* 96* 97* 94*  CO2 34* 34* 33* 32 32  GLUCOSE 133* 142* 102* 107* 128*  BUN 11 13 11 9 9   CREATININE 0.71 0.67 0.66 0.60* 0.70  CALCIUM 8.1* 8.0* 7.8* 7.6* 7.9*  MG  --  2.1  --   --   --    PHOS  --  2.6  --   --   --    GFR: Estimated Creatinine Clearance: 95.7 mL/min (by C-G formula based on SCr of 0.7 mg/dL). Liver Function Tests: Recent Labs  Lab 06/23/20 0957  AST 13*  ALT 12  ALKPHOS 97  BILITOT 0.7  PROT 5.7*  ALBUMIN 2.1*   No results for input(s): LIPASE, AMYLASE in the last 168 hours. No results for input(s): AMMONIA in the last 168 hours. Coagulation Profile: No results for input(s): INR, PROTIME in the last 168 hours. Cardiac Enzymes: No results for input(s): CKTOTAL, CKMB, CKMBINDEX, TROPONINI in the last 168 hours. BNP (last 3 results) No results for input(s): PROBNP in the last 8760 hours. HbA1C: No results for input(s): HGBA1C in the last 72 hours. CBG: No results for input(s): GLUCAP in the last 168 hours. Lipid Profile: No results for input(s): CHOL, HDL, LDLCALC, TRIG, CHOLHDL, LDLDIRECT in the last 72 hours. Thyroid Function Tests: No results for input(s): TSH, T4TOTAL, FREET4, T3FREE, THYROIDAB in the last 72 hours. Anemia Panel: No results for input(s): VITAMINB12, FOLATE, FERRITIN, TIBC, IRON, RETICCTPCT in the last 72 hours. Sepsis Labs: Recent Labs  Lab 06/21/20 0400 06/23/20 0957 06/24/20 0437 06/25/20 0508  PROCALCITON 0.43 0.30 0.26 0.17    Recent Results (from the past 240 hour(s))  SARS Coronavirus 2 by RT PCR (hospital order, performed in [facility] hospital lab) Nasopharyngeal Nasopharyngeal Swab     Status: None   Collection Time: 06/18/20  8:53 PM   Specimen: Nasopharyngeal Swab  Result Value Ref Range Status   SARS Coronavirus 2 NEGATIVE NEGATIVE Final    Comment: (NOTE) SARS-CoV-2 target nucleic acids are NOT DETECTED.  The SARS-CoV-2 RNA is generally detectable in upper and lower respiratory specimens during the acute phase of infection. The lowest concentration of SARS-CoV-2 viral copies this assay can detect is 250 copies / mL. A negative result does not preclude SARS-CoV-2 infection and should not be used  as the sole basis for treatment or other patient management decisions.  A negative result may occur with improper specimen collection / handling, submission of specimen other than nasopharyngeal swab, presence of viral mutation(s) within the areas targeted by this assay, and inadequate number of viral copies (<250 copies / mL). A negative result must be combined with clinical observations, patient history, and epidemiological information.  Fact Sheet for Patients:   StrictlyIdeas.no  Fact Sheet for Healthcare Providers: BankingDealers.co.za  This test is not yet approved or  cleared by the [place] FDA and has been authorized for detection and/or diagnosis of SARS-CoV-2 by FDA under an Emergency Use Authorization (EUA).  This EUA will remain  in effect (meaning this test can be used) for the duration of the COVID-19 declaration under Section 564(b)(1) of the Act, 21 U.S.C. section 360bbb-3(b)(1), unless the authorization is terminated or revoked sooner.  Performed at [facility], [address] [place], [place] [address]   Culture, blood (routine x 2)     Status: None   Collection Time: 06/18/20  8:54 PM   Specimen: BLOOD  Result Value Ref Range Status   Specimen Description BLOOD RIGHT FA  Final   Special Requests   Final    BOTTLES DRAWN AEROBIC AND ANAEROBIC Blood Culture adequate volume   Culture   Final    NO GROWTH 5 DAYS Performed at [facility], [address], [place], [place] [address]    Report Status 06/23/2020 FINAL  Final  Culture, blood (routine x 2)     Status: None   Collection Time: 06/18/20  8:54 PM   Specimen: BLOOD  Result Value Ref Range Status   Specimen Description BLOOD LEFT FA  Final   Special Requests   Final    BOTTLES DRAWN AEROBIC AND ANAEROBIC Blood Culture adequate volume   Culture   Final    NO GROWTH 5 DAYS Performed at [facility], [address]  [place], [place] [address]    Report Status 06/23/2020 FINAL  Final  Culture, sputum-assessment     Status: None   Collection Time: 06/19/20  7:28 AM   Specimen: Sputum  Result Value Ref Range Status   Specimen Description SPUTUM  Final   Special Requests NONE  Final   Sputum evaluation   Final    THIS SPECIMEN IS ACCEPTABLE FOR SPUTUM CULTURE Performed at [facility], [address] [place], [place] [address]    Report Status 06/19/2020 FINAL  Final  Culture, respiratory     Status: None   Collection Time: 06/19/20  7:28 AM   Specimen: SPU  Result Value Ref Range Status   Specimen Description   Final    SPUTUM Performed at [facility], [address] [place], [place] [address]    Special Requests   Final    NONE Reflexed from [id number] Performed at [facility], [place], [place] [address]    Gram Stain   Final    MODERATE WBC PRESENT,BOTH PMN AND MONONUCLEAR MODERATE GRAM POSITIVE COCCI FEW GRAM VARIABLE ROD RARE YEAST    Culture   Final    FEW Normal respiratory flora-no Staph aureus or Pseudomonas seen Performed at [facility], [place] [address], [place], [place] [address]    Report Status 06/21/2020 FINAL  Final  Surgical pcr screen     Status: None   Collection Time: 06/22/20  3:22 PM   Specimen: Nasal Mucosa; Nasal Swab  Result Value Ref Range Status   MRSA, PCR NEGATIVE NEGATIVE Final   Staphylococcus aureus NEGATIVE NEGATIVE Final    Comment: (NOTE) The Xpert SA Assay (FDA approved for NASAL specimens in patients 29 years of age and older), is one component of a comprehensive surveillance program. It is not intended to diagnose infection nor to guide or monitor treatment. Performed at [facility], [address] [place],  [address]          Radiology Studies: [facility] Chest [place] 1 View  Result Date: 06/26/2020 CLINICAL DATA:  Shortness of breath. EXAM: PORTABLE CHEST 1 VIEW COMPARISON:   June 23, 2020. FINDINGS: The heart size and mediastinal contours  are within normal limits. No pneumothorax or pleural effusion is noted. Stable diffuse left lung opacities are noted consistent with pneumonia. Stable right basilar opacity is noted concerning for worsening atelectasis or pneumonia. The visualized skeletal structures are unremarkable. IMPRESSION: Stable bilateral lung opacities are noted consistent with multifocal pneumonia. Electronically Signed   By: Marijo Conception M.D.   On: 06/26/2020 08:57        Scheduled Meds:  aspirin EC  81 mg Oral Daily   clonazePAM  1 mg Oral BID   enoxaparin (LOVENOX) injection  40 mg Subcutaneous Q24H   fluticasone furoate-vilanterol  1 puff Inhalation Daily   guaiFENesin  1,200 mg Oral BID   ipratropium  2 puff Inhalation Q4H   levothyroxine  25 mcg Oral QAC breakfast   lidocaine  15 mL Oral Once   pantoprazole  40 mg Oral Daily   senna-docusate  1 tablet Oral BID   sucralfate  1 g Oral TID WC & HS   umeclidinium bromide  1 puff Inhalation Daily   Continuous Infusions:  ampicillin-sulbactam (UNASYN) IV 3 g (06/27/20 0838)     LOS: 9 days    Time spent: 32 mins    Wyvonnia Dusky, MD [facility] Pager [phone]  If 7PM-7AM, please contact night-coverage [email or web address] 06/27/2020, 12:52 PM

## 2020-06-28 LAB — EXPECTORATED SPUTUM ASSESSMENT W GRAM STAIN, RFLX TO RESP C

## 2020-06-28 LAB — CBC
HCT: 36.3 % — ABNORMAL LOW (ref 39.0–52.0)
Hemoglobin: 11.1 g/dL — ABNORMAL LOW (ref 13.0–17.0)
MCH: 29.1 pg (ref 26.0–34.0)
MCHC: 30.6 g/dL (ref 30.0–36.0)
MCV: 95 fL (ref 80.0–100.0)
Platelets: 608 10*3/uL — ABNORMAL HIGH (ref 150–400)
RBC: 3.82 MIL/uL — ABNORMAL LOW (ref 4.22–5.81)
RDW: 13.9 % (ref 11.5–15.5)
WBC: 14.3 10*3/uL — ABNORMAL HIGH (ref 4.0–10.5)
nRBC: 0 % (ref 0.0–0.2)

## 2020-06-28 LAB — BASIC METABOLIC PANEL
Anion gap: 8 (ref 5–15)
BUN: 9 mg/dL (ref 8–23)
CO2: 34 mmol/L — ABNORMAL HIGH (ref 22–32)
Calcium: 7.9 mg/dL — ABNORMAL LOW (ref 8.9–10.3)
Chloride: 95 mmol/L — ABNORMAL LOW (ref 98–111)
Creatinine, Ser: 0.66 mg/dL (ref 0.61–1.24)
GFR calc Af Amer: >60 mL/min (ref >60)
GFR calc non Af Amer: >60 mL/min (ref >60)
Glucose, Bld: 132 mg/dL — ABNORMAL HIGH (ref 70–99)
Potassium: 4 mmol/L (ref 3.5–5.1)
Sodium: 137 mmol/L (ref 135–145)

## 2020-06-28 MED ORDER — SODIUM CHLORIDE 0.9 % IV SOLN: INTRAVENOUS | Status: DC

## 2020-06-28 NOTE — Progress Notes (Signed)
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 Repeat CXR shows stable b/l infiltrates. Present on admission. Likely chronic as per pt's hx. Continue on IV unasyn as per ID. AFB is neg. Strept is neg. Patient has capacity to consent as per psych & will need close follow-up with psychiatry as outpatient. Poor prognosis. Pt is requesting hospice but hospice is not available over the weekend, will consult tomorrow    COPD: severe & progressive. Continue on IV abxs, bronchodilators. Encourage incentive spirometry. Pulmon following and recs apprec. Poor prognosis  Chronic hypoxic respiratory failure: continue on supplemental oxygen, currently on 4L [place] which is pt's baseline. Encourage incentive spirometry   Thrombocytosis: likely reactive. Will continue to monitor   Leukocytosis: likely secondary to pneumonia. Continue on IV abxs  Constipation: continue on colace  Anxiety: severity unknown. Continue on home dose of klonopin   Hypokalemia: WNL. Will continue to monitor   Onychomycosis & PVD: of b/l LEs. S/p bedside nail debridement as per podiatry. Clean feet w/ normal sterile saline as per podiatry. .  Likely GERD: continue on pantoprazole   DVT prophylaxis: lovenox  Code Status: DNR Family Communication:  Disposition Plan: PT recs SNF  Status is: Inpatient  Remains inpatient appropriate because:IV treatments appropriate due to intensity of illness or inability to take PO, CM working on SNF placement but pt still asking about hospice. Will consult hospice tomorrow as it is not available  over the weekend   Dispo: The patient is from: Home              Anticipated d/c is to: SNF               Anticipated d/c date is: 3 days              Patient currently is not medically stable to d/c.   Consultants:   Pulmon  ID   Procedures:   Antimicrobials: unasyn   Subjective: Pt c/o fatigue   Objective: Vitals:   06/27/20 1549 06/28/20 0007 06/28/20 0135 06/28/20 0352  BP: (!) 106/48 (!) 118/55 (!) 129/58 123/63  Pulse: (!) 103 (!) 116 (!) 125 (!) 116  Resp: 16 18 17 20   Temp: 97.9 F (36.6 C) 98 F (36.7 C) 100.3 F (37.9 C) 98.7 F (37.1 C)  TempSrc: Oral Oral Oral Oral  SpO2: 99% 99% 90% 97%  Weight:      Height:        Intake/Output Summary (Last 24 hours) at 06/28/2020 0738 Last data filed at 06/28/2020 0704 Gross per 24 hour  Intake 1673.36 ml  Output 2075 ml  Net -401.64 ml   Filed Weights   06/18/20 2006  Weight: 81.6 kg    Examination:  General exam: Appears calm & comfortable. Disheveled  Respiratory system: course breath sounds b/l Cardiovascular system: S1/S2+. No rubs or gallops Gastrointestinal system: abd is soft, non-tender, non-distended & normal bowel sounds  Central nervous system: Alert and oriented. Moves all 4 extremities  Psychiatry: Judgement and insight appear normal. Tangential thinking     Data Reviewed: I have personally reviewed following labs and imaging studies  CBC: Recent Labs  Lab 06/23/20 0957 06/25/20 0508 06/26/20 0445 06/27/20 0657 06/28/20 0327  WBC 21.7* 15.0* 15.1* 14.5* 14.3*  NEUTROABS 18.9*  --   --   --   --   HGB 13.4 12.1* 11.4* 11.4* 11.1*  HCT 42.1 37.4* 36.5* 35.3* 36.3*  MCV 91.1 89.9 93.6 90.5 95.0  PLT 483* 501* 530* 559* 696*   Basic Metabolic Panel: Recent Labs  Lab 06/23/20 0957 06/25/20 0508 06/26/20 0445 06/27/20 0657 06/28/20 0327  NA 135 138 136 133* 137  K 3.6 3.4* 3.7 3.6 4.0  CL 93* 96* 97* 94* 95*  CO2 34* 33* 32 32 34*  GLUCOSE 142* 102* 107* 128* 132*    BUN 13 11 9 9 9   CREATININE 0.67 0.66 0.60* 0.70 0.66  CALCIUM 8.0* 7.8* 7.6* 7.9* 7.9*  MG 2.1  --   --   --   --   PHOS 2.6  --   --   --   --    GFR: Estimated Creatinine Clearance: 95.7 mL/min (by C-G formula based on SCr of 0.66 mg/dL). Liver Function Tests: Recent Labs  Lab 06/23/20 0957  AST 13*  ALT 12  ALKPHOS 97  BILITOT 0.7  PROT 5.7*  ALBUMIN 2.1*   No results for input(s): LIPASE, AMYLASE in the last 168 hours. No results for input(s): AMMONIA in the last 168 hours. Coagulation Profile: No results for input(s): INR, PROTIME in the last 168 hours. Cardiac Enzymes: No results for input(s): CKTOTAL, CKMB, CKMBINDEX, TROPONINI in the last 168 hours. BNP (last 3 results) No results for input(s): PROBNP in the last 8760 hours. HbA1C: No results for input(s): HGBA1C in the last 72 hours. CBG: No results for input(s): GLUCAP in the last 168 hours. Lipid Profile: No results for input(s): CHOL, HDL, LDLCALC, TRIG, CHOLHDL, LDLDIRECT in the last 72 hours. Thyroid Function Tests: No results for input(s): TSH, T4TOTAL, FREET4, T3FREE, THYROIDAB in the last 72 hours. Anemia Panel: No results for input(s): VITAMINB12, FOLATE, FERRITIN, TIBC, IRON, RETICCTPCT in the last 72 hours. Sepsis Labs: Recent Labs  Lab 06/23/20 0957 06/24/20 0437 06/25/20 0508  PROCALCITON 0.30 0.26 0.17    Recent Results (from the past 240 hour(s))  SARS Coronavirus 2 by RT PCR (hospital order, performed in [facility] hospital lab) Nasopharyngeal Nasopharyngeal Swab     Status: None   Collection Time: 06/18/20  8:53 PM   Specimen: Nasopharyngeal Swab  Result Value Ref Range Status   SARS Coronavirus 2 NEGATIVE NEGATIVE Final    Comment: (NOTE) SARS-CoV-2 target nucleic acids are NOT DETECTED.  The SARS-CoV-2 RNA is generally detectable in upper and lower respiratory specimens during the acute phase of infection. The lowest concentration of SARS-CoV-2 viral copies this assay can detect  is 250 copies / mL. A negative result does not preclude SARS-CoV-2 infection and should not be used as the sole basis for treatment or other patient management decisions.  A negative result may occur with improper specimen collection / handling, submission of specimen other than nasopharyngeal swab, presence of viral mutation(s) within the areas targeted by this assay, and inadequate number of viral copies (<250 copies / mL). A negative result must be combined with clinical observations, patient history, and epidemiological information.  Fact Sheet for Patients:   StrictlyIdeas.no  Fact Sheet for Healthcare Providers: BankingDealers.co.za  This test is not yet approved or  cleared by the [place] FDA and has been authorized for detection and/or diagnosis of SARS-CoV-2 by FDA  under an Emergency Use Authorization (EUA).  This EUA will remain in effect (meaning this test can be used) for the duration of the COVID-19 declaration under Section 564(b)(1) of the Act, 21 U.S.C. section 360bbb-3(b)(1), unless the authorization is terminated or revoked sooner.  Performed at [facility], [address] [place], [place] [address]   Culture, blood (routine x 2)     Status: None   Collection Time: 06/18/20  8:54 PM   Specimen: BLOOD  Result Value Ref Range Status   Specimen Description BLOOD RIGHT FA  Final   Special Requests   Final    BOTTLES DRAWN AEROBIC AND ANAEROBIC Blood Culture adequate volume   Culture   Final    NO GROWTH 5 DAYS Performed at [facility], [address] [place], [place] [address]    Report Status 06/23/2020 FINAL  Final  Culture, blood (routine x 2)     Status: None   Collection Time: 06/18/20  8:54 PM   Specimen: BLOOD  Result Value Ref Range Status   Specimen Description BLOOD LEFT FA  Final   Special Requests   Final    BOTTLES DRAWN AEROBIC AND ANAEROBIC Blood Culture adequate  volume   Culture   Final    NO GROWTH 5 DAYS Performed at [facility], [address] [place], [place] [address]    Report Status 06/23/2020 FINAL  Final  Culture, sputum-assessment     Status: None   Collection Time: 06/19/20  7:28 AM   Specimen: Sputum  Result Value Ref Range Status   Specimen Description SPUTUM  Final   Special Requests NONE  Final   Sputum evaluation   Final    THIS SPECIMEN IS ACCEPTABLE FOR SPUTUM CULTURE Performed at [facility], [address], [place], [place] [address]    Report Status 06/19/2020 FINAL  Final  Culture, respiratory     Status: None   Collection Time: 06/19/20  7:28 AM   Specimen: SPU  Result Value Ref Range Status   Specimen Description   Final    SPUTUM Performed at [facility], [address], [place], [place] [address]    Special Requests   Final    NONE Reflexed from [id number] Performed at [facility], [place], [place] [address]    Gram Stain   Final    MODERATE WBC PRESENT,BOTH PMN AND MONONUCLEAR MODERATE GRAM POSITIVE COCCI FEW GRAM VARIABLE ROD RARE YEAST    Culture   Final    FEW Normal respiratory flora-no Staph aureus or Pseudomonas seen Performed at [facility], [place] [address], [place], [place] [address]    Report Status 06/21/2020 FINAL  Final  Surgical pcr screen     Status: None   Collection Time: 06/22/20  3:22 PM   Specimen: Nasal Mucosa; Nasal Swab  Result Value Ref Range Status   MRSA, PCR NEGATIVE NEGATIVE Final   Staphylococcus aureus NEGATIVE NEGATIVE Final    Comment: (NOTE) The Xpert SA Assay (FDA approved for NASAL specimens in patients 21 years of age and older), is one component of a comprehensive surveillance program. It is not intended to diagnose infection nor to guide or monitor treatment. Performed at [facility], [address] [place], [place] [address]   Expectorated sputum assessment w rflx to resp cult      Status: None   Collection Time: 06/28/20  1:10 AM   Specimen: Expectorated Sputum  Result Value  Ref Range Status   Specimen Description EXPECTORATED SPUTUM  Final   Special Requests NONE  Final   Sputum evaluation   Final    THIS SPECIMEN IS ACCEPTABLE FOR SPUTUM CULTURE Performed at [facility], [address] [place], [place] [address]    Report Status 06/28/2020 FINAL  Final         Radiology Studies: [facility] Chest [place] 1 View  Result Date: 06/26/2020 CLINICAL DATA:  Shortness of breath. EXAM: PORTABLE CHEST 1 VIEW COMPARISON:  June 23, 2020. FINDINGS: The heart size and mediastinal contours are within normal limits. No pneumothorax or pleural effusion is noted. Stable diffuse left lung opacities are noted consistent with pneumonia. Stable right basilar opacity is noted concerning for worsening atelectasis or pneumonia. The visualized skeletal structures are unremarkable. IMPRESSION: Stable bilateral lung opacities are noted consistent with multifocal pneumonia. Electronically Signed   By: Marijo Conception M.D.   On: 06/26/2020 08:57        Scheduled Meds: . aspirin EC  81 mg Oral Daily  . clonazePAM  1 mg Oral BID  . enoxaparin (LOVENOX) injection  40 mg Subcutaneous Q24H  . fluticasone furoate-vilanterol  1 puff Inhalation Daily  . guaiFENesin  1,200 mg Oral BID  . ipratropium  2 puff Inhalation Q4H  . levothyroxine  25 mcg Oral QAC breakfast  . lidocaine  15 mL Oral Once  . pantoprazole  40 mg Oral Daily  . senna-docusate  1 tablet Oral BID  . sucralfate  1 g Oral TID WC & HS  . umeclidinium bromide  1 puff Inhalation Daily   Continuous Infusions: . sodium chloride 100 mL/hr at 06/28/20 0038  . ampicillin-sulbactam (UNASYN) IV 3 g (06/28/20 0603)     LOS: 10 days    Time spent: 30 mins    Wyvonnia Dusky, MD [facility] Pager [phone]  If 7PM-7AM, please contact night-coverage [email or web address] 06/28/2020, 7:38 AM

## 2020-06-28 NOTE — Plan of Care (Signed)

## 2020-06-28 NOTE — Progress Notes (Signed)
   06/28/20 0007  Assess: MEWS Score  Temp 98 F (36.7 C)  BP (!) 118/55  Pulse Rate (!) 116  Resp 18  SpO2 99 %  O2 Device Nasal Cannula  O2 Flow Rate (L/min) 4 L/min  Assess: MEWS Score  MEWS Temp 0  MEWS Systolic 0  MEWS Pulse 2  MEWS RR 0  MEWS LOC 0  MEWS Score 2  MEWS Score Color Yellow  Assess: if the MEWS score is Yellow or Red  Were vital signs taken at a resting state? Yes  Focused Assessment Change from prior assessment (see assessment flowsheet)  Early Detection of Sepsis Score *See Row Information* Low  MEWS guidelines implemented *See Row Information* Yes  Treat  MEWS Interventions Escalated (See documentation below)  Pain Scale 0-10  Pain Score 0  Take Vital Signs  Increase Vital Sign Frequency  Yellow: Q 2hr X 2 then Q 4hr X 2, if remains yellow, continue Q 4hrs  Escalate  MEWS: Escalate Yellow: discuss with charge nurse/RN and consider discussing with provider and RRT  Notify: Charge Nurse/RN  Name of Charge Nurse/RN Notified Stanton Kidney, RN  Date Charge Nurse/RN Notified 06/28/20  Time Charge Nurse/RN Notified 0025  Notify: Provider  Provider Name/Title Hassan Rowan Morrison/NP  Date Provider Notified 06/28/20  Time Provider Notified 0020  Notification Type  (secure message)  Notification Reason Change in status  Response See new orders  Date of Provider Response 06/28/20  Time of Provider Response 0030

## 2020-06-29 DIAGNOSIS — J189 Pneumonia, unspecified organism: Secondary | ICD-10-CM | POA: Diagnosis not present

## 2020-06-29 DIAGNOSIS — J851 Abscess of lung with pneumonia: Secondary | ICD-10-CM | POA: Diagnosis not present

## 2020-06-29 DIAGNOSIS — J449 Chronic obstructive pulmonary disease, unspecified: Secondary | ICD-10-CM | POA: Diagnosis not present

## 2020-06-29 LAB — BASIC METABOLIC PANEL
Anion gap: 6 (ref 5–15)
BUN: 7 mg/dL — ABNORMAL LOW (ref 8–23)
CO2: 34 mmol/L — ABNORMAL HIGH (ref 22–32)
Calcium: 7.9 mg/dL — ABNORMAL LOW (ref 8.9–10.3)
Chloride: 93 mmol/L — ABNORMAL LOW (ref 98–111)
Creatinine, Ser: 0.73 mg/dL (ref 0.61–1.24)
GFR calc Af Amer: >60 mL/min (ref >60)
GFR calc non Af Amer: >60 mL/min (ref >60)
Glucose, Bld: 171 mg/dL — ABNORMAL HIGH (ref 70–99)
Potassium: 3.5 mmol/L (ref 3.5–5.1)
Sodium: 133 mmol/L — ABNORMAL LOW (ref 135–145)

## 2020-06-29 LAB — MISC LABCORP TEST (SEND OUT)
LabCorp test name: 164284
Labcorp test code: 164284
Source (LabCorp): 164284

## 2020-06-29 LAB — CBC
HCT: 37.1 % — ABNORMAL LOW (ref 39.0–52.0)
Hemoglobin: 11.4 g/dL — ABNORMAL LOW (ref 13.0–17.0)
MCH: 28.6 pg (ref 26.0–34.0)
MCHC: 30.7 g/dL (ref 30.0–36.0)
MCV: 93 fL (ref 80.0–100.0)
Platelets: 627 10*3/uL — ABNORMAL HIGH (ref 150–400)
RBC: 3.99 MIL/uL — ABNORMAL LOW (ref 4.22–5.81)
RDW: 13.7 % (ref 11.5–15.5)
WBC: 16 10*3/uL — ABNORMAL HIGH (ref 4.0–10.5)
nRBC: 0 % (ref 0.0–0.2)

## 2020-06-29 MED ORDER — AMOXICILLIN-POT CLAVULANATE 875-125 MG PO TABS
1.0000 | ORAL_TABLET | Freq: Two times a day (BID) | ORAL | Status: DC
  Administered 2020-06-30 (×2): 1 via ORAL
  Filled 2020-06-29 (×2): qty 1

## 2020-06-29 NOTE — Progress Notes (Signed)
Pulmonary Medicine          Date: 06/29/2020,   MRN# [id number] Mark [name] 12-22-1950     AdmissionWeight: 81.6 kg                 CurrentWeight: 81.6 kg   Referring physician: Dr. Manuella Ghazi   CHIEF COMPLAINT:   Necrotizing pneumonia   HISTORY OF PRESENT ILLNESS   Is a pleasant 69 year old male with a history of chronic pain syndrome with opioid-induced constipation, GERD, advanced COPD chronic hypoxemia on 4 L/min supplemental oxygen home O2.  Also has a background history of hypothyroidism, peripheral neuropathy chronic lumbago, reports worsening respiratory status x2 to 3 months with signs and symptoms of acute COPD exacerbation with increased volume and darkening of phlegm on expectoration.  Most recently in the last 2 weeks prior to admission he developed chills diaphoresis and subjective fevers.  In the ER he was found to have leukocytosis, negative COVID-19 test x2, increased O2 requirement from 4 to 5 L/min nasal cannula however venous blood gas was essentially normal BNP and troponin also within reference range.  CT chest shows necrotizing pneumonia with consolidative infiltrate of the left upper and lower lung zones as well as fluid level cystic lesion suggestive of abscess with sorrounding emphysema.  Pulmonary consultation placed for further evaluation and management of necrotizing pneumonia.  He has crusted swollen erythematous feet with intertrigo, he has distended abdomen, poor dentition, and reports broken back. He shares that he has no family and when asked regarding medical proxy he states "im on my own".  Overall he looks chronically ill.    06/20/20- patient had a good night sleep and now is more lucid, hes drinking coffee.  He is speaking rapidly with cicumferential and tangential language. WBC count is with mild trend up.   06/22/20- patient is DNR/DNI after speaking to palliative today - appreciate collaboration. Sputum with GPCs. Patient requests MDI  inhaler we discussed use of Spiriva and Symbicort.  Mild hypokalemia this am, pharmacy consult for repletion. Repeat CXR in am.    06/23/20- patient appears clincally improved this morning.  CXR this am reviewed by me - left lung cavitary pneumonia and superimposed combined pulmonary fibrosis and emphysema. Patient is on 4L/min [place]  06/24/20-patient is similar as yesterday he complains of post prandial substernal chest discomfort, we discussed potential GERD related pain. He has extensive emphysema and will unlikely be a good candidate for bronchoscopy. We will order spirometry to evaluate candidacy for anesthesia.   06/25/20- patient is slightly imrproved this am.  Leukocytosis finally trending down, and he is coughing up more phlegm thickened brown material possibly aspirated food stuffs. He has GERD will order lidocaine GI coctail and carafate.   06/26/20- patient is on 3L/min, he complains of thrush but throat, lingual surface and upper airway are clear. He had barium study yesterday due to complaints of severe dysphagia but that was also unremarkable. He is speaking in full sentences without desaturation and breathing on auscultation is inmproved  Bilateraly.   06/27/20- patient is more conversive, hes saturating >95% on 4L can wean down as able. He is coughing up more brown debris, I will recollect resp culture.  Left and right sided auscultation is imrpoved.   06/29/20- patient is improved, he is on home setting of 3-4L/min.  He is smiling.  He has 4 bananas for lunch, have asked him to pace himself so he does not get constipated or with elelctrolyte derrangement.  PAST MEDICAL HISTORY   Past Medical History:  Diagnosis Date  . Acute respiratory failure with hypoxia and hypercapnia ([place]) 05/01/2016  . Asthma   . COPD (chronic obstructive pulmonary disease) ([place])   . Osteoporosis      SURGICAL HISTORY   Past Surgical History:  Procedure Laterality Date  . BACK SURGERY       FAMILY  HISTORY   Family History  Problem Relation Age of Onset  . Pulmonary fibrosis Mother      SOCIAL HISTORY   Social History   Tobacco Use  . Smoking status: Former Research scientist (life sciences)  . Smokeless tobacco: Never Used  Substance Use Topics  . Alcohol use: No  . Drug use: No     MEDICATIONS    Home Medication:    Current Medication:  Current Facility-Administered Medications:  .  0.9 %  sodium chloride infusion, , Intravenous, Continuous, Sharion Settler, NP, Last Rate: 100 mL/hr at 06/28/20 0038, New Bag at 06/28/20 0038 .  albuterol (PROVENTIL) (2.5 MG/3ML) 0.083% nebulizer solution 3 mL, 3 mL, Inhalation, Q4H PRN, Lenore Cordia, MD, 3 mL at 06/25/20 0152 .  alum & mag hydroxide-simeth (MAALOX/MYLANTA) 200-200-20 MG/5ML suspension 30 mL, 30 mL, Oral, Q6H PRN **AND** lidocaine (XYLOCAINE) 2 % viscous mouth solution 15 mL, 15 mL, Oral, Once, Ottie Glazier, MD .  Derrill Memo ON 06/30/2020] amoxicillin-clavulanate (AUGMENTIN) 875-125 MG per tablet 1 tablet, 1 tablet, Oral, Q12H, Ravishankar, Jayashree, MD .  Ampicillin-Sulbactam (UNASYN) 3 g in sodium chloride 0.9 % 100 mL IVPB, 3 g, Intravenous, Q6H, Ravishankar, Jayashree, MD, Last Rate: 200 mL/hr at 06/29/20 1343, 3 g at 06/29/20 1343 .  aspirin EC tablet 81 mg, 81 mg, Oral, Daily, Nolberto Hanlon, MD, 81 mg at 06/29/20 0957 .  chlorpheniramine-HYDROcodone (TUSSIONEX) 10-8 MG/5ML suspension 5 mL, 5 mL, Oral, Q4H PRN, Max Sane, MD, 5 mL at 06/29/20 1027 .  clonazePAM (KLONOPIN) tablet 1 mg, 1 mg, Oral, BID, Zada Finders R, MD, 1 mg at 06/29/20 0958 .  enoxaparin (LOVENOX) injection 40 mg, 40 mg, Subcutaneous, Q24H, Zada Finders R, MD, 40 mg at 06/29/20 0958 .  fluticasone (FLONASE) 50 MCG/ACT nasal spray 2 spray, 2 spray, Each Nare, Daily PRN, Kurtis Bushman, Sahar, MD .  fluticasone furoate-vilanterol (BREO ELLIPTA) 200-25 MCG/INH 1 puff, 1 puff, Inhalation, Daily, Nolberto Hanlon, MD, 1 puff at 06/29/20 0959 .  guaiFENesin (MUCINEX) 12 hr tablet 1,200  mg, 1,200 mg, Oral, BID, Zada Finders R, MD, 1,200 mg at 06/29/20 0958 .  ipratropium (ATROVENT HFA) inhaler 2 puff, 2 puff, Inhalation, Q4H, Wyvonnia Dusky, MD, 2 puff at 06/29/20 1344 .  levothyroxine (SYNTHROID) tablet 25 mcg, 25 mcg, Oral, QAC breakfast, Lenore Cordia, MD, 25 mcg at 06/29/20 0441 .  pantoprazole (PROTONIX) EC tablet 40 mg, 40 mg, Oral, Daily, Wyvonnia Dusky, MD, 40 mg at 06/29/20 0958 .  polyethylene glycol (MIRALAX / GLYCOLAX) packet 17 g, 17 g, Oral, Daily PRN, Nolberto Hanlon, MD, 17 g at 06/22/20 1353 .  senna-docusate (Senokot-S) tablet 1 tablet, 1 tablet, Oral, BID, Nolberto Hanlon, MD, 1 tablet at 06/29/20 0958 .  sucralfate (CARAFATE) tablet 1 g, 1 g, Oral, TID WC & HS, Daley Gosse, MD, 1 g at 06/29/20 1344 .  [DISCONTINUED] fluticasone furoate-vilanterol (BREO ELLIPTA) 100-25 MCG/INH 1 puff, 1 puff, Inhalation, Daily, 1 puff at 06/22/20 0856 **AND** umeclidinium bromide (INCRUSE ELLIPTA) 62.5 MCG/INH 1 puff, 1 puff, Inhalation, Daily, Lu Duffel, RPH, 1 puff at 06/29/20 1000    ALLERGIES  Patient has no known allergies.     REVIEW OF SYSTEMS    Review of Systems:  Gen:  Denies  fever, sweats, chills weigh loss  HEENT: Denies blurred vision, double vision, ear pain, eye pain, hearing loss, nose bleeds, sore throat Cardiac:  No dizziness, chest pain or heaviness, chest tightness,edema Resp:   Reports SOB Gi: Denies swallowing difficulty, stomach pain, nausea or vomiting, diarrhea, constipation, bowel incontinence Gu:  Denies bladder incontinence, burning urine Ext:   Denies Joint pain, stiffness or swelling Skin: Denies  skin rash, easy bruising or bleeding or hives Endoc:  Denies polyuria, polydipsia , polyphagia or weight change Psych:   Denies depression, insomnia or hallucinations   Other:  All other systems negative   VS: BP 122/72 (BP Location: Right Arm)   Pulse (!) 110   Temp 97.8 F (36.6 C) (Oral)   Resp 18   Ht 6'  (1.829 m)   Wt 81.6 kg   SpO2 91%   BMI 24.41 kg/m      PHYSICAL EXAM    GENERAL:NAD, no fevers, chills, no weakness no fatigue HEAD: Normocephalic, atraumatic.  EYES: Pupils equal, round, reactive to light. Extraocular muscles intact. No scleral icterus.  MOUTH: Moist mucosal membrane. Dentition intact. No abscess noted.  EAR, NOSE, THROAT: Clear without exudates. No external lesions.  NECK: Supple. No thyromegaly. No nodules. No JVD.  PULMONARY:rhonchi bilaterally  CARDIOVASCULAR: S1 and S2. Regular rate and rhythm. No murmurs, rubs, or gallops. No edema. Pedal pulses 2+ bilaterally.  GASTROINTESTINAL: Soft, nontender, nondistended. No masses. Positive bowel sounds. No hepatosplenomegaly.  MUSCULOSKELETAL: No swelling, clubbing, or edema. Range of motion full in all extremities.  NEUROLOGIC: Cranial nerves II through XII are intact. No gross focal neurological deficits. Sensation intact. Reflexes intact.  SKIN: crusted feet with erythemaouts intertrigo. PSYCHIATRIC: Mood, affect within normal limits. The patient is awake, alert and oriented x 3. Insight, judgment intact.       IMAGING    CT Chest W Contrast  Result Date: 06/18/2020 CLINICAL DATA:  69 year old male with shortness of breath, productive cough, pneumonia. On home oxygen. EXAM: CT CHEST WITH CONTRAST TECHNIQUE: Multidetector CT imaging of the chest was performed during intravenous contrast administration. CONTRAST:  72mL OMNIPAQUE IOHEXOL 300 MG/ML  SOLN COMPARISON:  Portable chest earlier today.  Chest CT 01/17/2020. FINDINGS: Cardiovascular: No cardiomegaly or pericardial effusion. Calcified coronary artery and aortic atherosclerosis. The central vascular structures of the mediastinum are enhancing and appear to be patent. Mediastinum/Nodes: Mildly increased in reactive appearing mediastinal lymph nodes compared to 2020, up to 14 mm short axis. Some nodes (right paratracheal on series 2, image 47) have not  significantly changed. Additionally, there is evidence of a larger 15 mm short axis left AP window node. Lungs/Pleura: Chronic pulmonary hyperinflation. Extensive chronic emphysema. Previous bullous emphysema in the left upper lung where now there is extensive consolidation with numerous small rounded areas of opacity suspicious for cavitation. There is a prominent fluid level lateral to the left hilum on series 3, image 65 suspicious for lung abscess. Abnormal interstitial thickening and peribronchial nodularity throughout the left lung. No superimposed pneumothorax. Only trace left pleural fluid. The major airways remain patent. Right lung emphysema and markings appear stable since last year. Upper Abdomen: Negative visible liver, gallbladder, spleen, pancreas, adrenal glands, kidneys, and bowel in the upper abdomen. Musculoskeletal: Chronic compression fractures and ankylosis in the spine, with several levels of previous vertebral body augmentation, appears stable since last year. Occasional chronic rib fractures.  No acute osseous abnormality identified. No chest wall abnormality. IMPRESSION: 1. 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 Consider necrotizing pneumonia as multiple areas are suspicious for cavitation, and especially a fluid-level lateral to the left hilum (series 2, image 68) strongly suggesting Lung Abscess. 2. Right lung unaffected.  Only trace left pleural fluid. 3. Reactive appearing mediastinal lymph nodes. 4. Calcified coronary artery and Aortic Atherosclerosis (ICD10-I70.0). Emphysema (ICD10-J43.9). Electronically Signed   By: Genevie Ann M.D.   On: 06/18/2020 22:14   DG Chest Port 1 View  Result Date: 06/26/2020 CLINICAL DATA:  Shortness of breath. EXAM: PORTABLE CHEST 1 VIEW COMPARISON:  June 23, 2020. FINDINGS: The heart size and mediastinal contours are within normal limits. No pneumothorax or pleural effusion is noted. Stable diffuse left lung opacities  are noted consistent with pneumonia. Stable right basilar opacity is noted concerning for worsening atelectasis or pneumonia. The visualized skeletal structures are unremarkable. IMPRESSION: Stable bilateral lung opacities are noted consistent with multifocal pneumonia. Electronically Signed   By: Marijo Conception M.D.   On: 06/26/2020 08:57   DG Chest Port 1 View  Result Date: 06/23/2020 CLINICAL DATA:  Pneumonia EXAM: PORTABLE CHEST 1 VIEW COMPARISON:  June 18, 2020 chest radiograph and chest CT FINDINGS: Extensive airspace opacity is noted throughout the left upper and left mid lung regions with areas of apparent cavitation. Less discrete infiltrate is noted throughout the remainder of the left lung, stable. There is underlying emphysematous change. Scattered areas of scarring noted on the right without edema or airspace opacity. Heart size and pulmonary vascularity are normal. No adenopathy. No bone lesions. IMPRESSION: Underlying emphysema. Widespread airspace opacity on the left with areas of cavitary pneumonia throughout the left upper and mid lung regions, similar to recent prior studies. Underlying fibrosis elsewhere, most severe left lower lung region. Scattered areas of scarring on the right without edema or airspace opacity. Stable cardiac silhouette. Electronically Signed   By: Lowella Grip III M.D.   On: 06/23/2020 08:37   DG Chest Portable 1 View  Result Date: 06/18/2020 CLINICAL DATA:  Shortness of breath all summer lung, worsening last week, productive cough, congestion EXAM: PORTABLE CHEST 1 VIEW COMPARISON:  CT 01/17/2019, radiograph 01/17/2019 FINDINGS: There is a background chronic reticular and fibrotic interstitial changes throughout both lungs albeit with new area of superimposed consolidative opacity spanning the left mid lung to apex including an area of potential cavitation seen more peripherally. No discernible pneumothorax or visible effusion. Cardiomediastinal contours  are partially obscured by overlying opacity with the visible borders similar in appearance to the comparison studies from prior. No acute osseous or soft tissue abnormality. Degenerative changes are present in the imaged spine and shoulders. Telemetry leads overlie the chest. IMPRESSION: Consolidative opacity in the left mid lung to apex with some questionable cavitation peripherally. Recommend further characterization with CT of the chest with contrast if patient is able to tolerate. Findings superimposed on chronic reticular and fibrotic interstitial changes. These results were called by telephone at the time of interpretation on 06/18/2020 at 8:45 pm to provider [facility] , who verbally acknowledged these results. Electronically Signed   By: Lovena Le M.D.   On: 06/18/2020 20:45   DG Swallowing Func-Speech Pathology  Result Date: 06/25/2020 Objective Swallowing Evaluation: Type of Study: MBS-Modified Barium Swallow Study  Patient Details Name: Mark [name] MRN: [id number] Date of Birth: 1950/11/01 Today's Date: 06/25/2020 Time: SLP Start Time (ACUTE ONLY): 4403 -SLP Stop Time (ACUTE ONLY): 4742 SLP  Time Calculation (min) (ACUTE ONLY): 30 min Past Medical History: Past Medical History: Diagnosis Date . Acute respiratory failure with hypoxia and hypercapnia ([place]) 05/01/2016 . Asthma  . COPD (chronic obstructive pulmonary disease) ([place])  . Osteoporosis  Past Surgical History: Past Surgical History: Procedure Laterality Date . BACK SURGERY   HPI: Mark [name] is a pleasant 69 year old male with a history of chronic pain syndrome with opioid-induced constipation, GERD, advanced COPD chronic hypoxemia on 4 L/min supplemental oxygen home O2.  Also has a background history of hypothyroidism, peripheral neuropathy chronic lumbago, reports worsening respiratory status x2 to 3 months with signs and symptoms of acute COPD exacerbation with increased volume and darkening of phlegm on expectoration.  CT chest shows  necrotizing pneumonia with consolidative infiltrate of the left upper and lower lung zones as well as fluid level cystic lesion suggestive of abscess with sorrounding emphysema.   Subjective: pt pleasant, conversant Assessment / Plan / Recommendation CHL IP CLINICAL IMPRESSIONS 06/25/2020 Clinical Impression Pt demonstrated grossly functional oropharyngeal abilities when consuming puree, solids, whole barium tablet, thin liquids via cup and nectar thick liquids via cup. On imaging pt presents with moderate oral phase deficits that are c/b decrased bolus management. However after talking with pt, it appears this is an incidental finding as pt reports this to be "the way I have always chewed." When consuming the above PO intake, penetration and aspiraiton were not observed. Pt reports that he doesn't have issues with swallow but after he swallows "it feels like Kerosene is going down my esophagus." He reports raw sensation. At this time, pt appears appropriate to continue on current diet. Extensive education provided on oral care (regardless of only having 2 teeth). Skilled ST intervention is not indicated at this time.  SLP Visit Diagnosis Dysphagia, unspecified (R13.10) Attention and concentration deficit following -- Frontal lobe and executive function deficit following -- Impact on safety and function Mild aspiration risk   CHL IP TREATMENT RECOMMENDATION 06/25/2020 Treatment Recommendations No treatment recommended at this time   No flowsheet data found. CHL IP DIET RECOMMENDATION 06/25/2020 SLP Diet Recommendations Regular solids;Thin liquid Liquid Administration via Cup Medication Administration Whole meds with liquid Compensations Minimize environmental distractions;Slow rate;Small sips/bites Postural Changes Seated upright at 90 degrees   CHL IP OTHER RECOMMENDATIONS 06/25/2020 Recommended Consults -- Oral Care Recommendations Oral care BID Other Recommendations --   CHL IP FOLLOW UP RECOMMENDATIONS 06/25/2020  Follow up Recommendations None   No flowsheet data found.     CHL IP ORAL PHASE 06/25/2020 Oral Phase WFL Oral - Pudding Teaspoon -- Oral - Pudding Cup -- Oral - Honey Teaspoon -- Oral - Honey Cup -- Oral - Nectar Teaspoon -- Oral - Nectar Cup -- Oral - Nectar Straw -- Oral - Thin Teaspoon -- Oral - Thin Cup -- Oral - Thin Straw -- Oral - Puree -- Oral - Mech Soft -- Oral - Regular -- Oral - Multi-Consistency -- Oral - Pill -- Oral Phase - Comment --  CHL IP PHARYNGEAL PHASE 06/25/2020 Pharyngeal Phase WFL Pharyngeal- Pudding Teaspoon -- Pharyngeal -- Pharyngeal- Pudding Cup -- Pharyngeal -- Pharyngeal- Honey Teaspoon -- Pharyngeal -- Pharyngeal- Honey Cup -- Pharyngeal -- Pharyngeal- Nectar Teaspoon -- Pharyngeal -- Pharyngeal- Nectar Cup -- Pharyngeal -- Pharyngeal- Nectar Straw -- Pharyngeal -- Pharyngeal- Thin Teaspoon -- Pharyngeal -- Pharyngeal- Thin Cup -- Pharyngeal -- Pharyngeal- Thin Straw -- Pharyngeal -- Pharyngeal- Puree -- Pharyngeal -- Pharyngeal- Mechanical Soft -- Pharyngeal -- Pharyngeal- Regular -- Pharyngeal -- Pharyngeal- Multi-consistency -- Pharyngeal --  Pharyngeal- Pill -- Pharyngeal -- Pharyngeal Comment --  CHL IP CERVICAL ESOPHAGEAL PHASE 06/25/2020 Cervical Esophageal Phase WFL Pudding Teaspoon -- Pudding Cup -- Honey Teaspoon -- Honey Cup -- Nectar Teaspoon -- Nectar Cup -- Nectar Straw -- Thin Teaspoon -- Thin Cup -- Thin Straw -- Puree -- Mechanical Soft -- Regular -- Multi-consistency -- Pill -- Cervical Esophageal Comment -- Happi Overton 06/25/2020, 1:00 PM                 ASSESSMENT/PLAN   Left lung necrortizing pneumonia with abscess -patient with poor dentition possible anaerobic etiology -will send off sputum cultures patient states he can cough phlegm -he is altered with encephalopathy -sputum culture x 3->>>> +GPCs  -AFB sputum-negatve -histo/strep pneumo/legionella urine ag -blood cultures-neg to date -MRSA nasal PCR -procalcitonin trend -defer antimicrobial  regimen to ID - currently on Unasyn>>rocephin +flagyl +vancomycin>>>Unasyn  -poor prognosis - palliative evaluation     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)  - chronic hypoxemia with increased O2 requirement  -his emphysema is beyond repair and patient has very poor prognosis -typical COPD carepath with duoneb, bronchopulmonary hygiene, steroids and antibiotics is appropriate for inpatient therapy   Altered mental status with confusion   - possible septic encephalitis, patient admits to drinking alcohol  - VBG does not support hypercarbic encephalopathy  -patient does not lack capacity and has had psychiatric evaluation    Lower extermity foot wounds  - wound care  And podiatry evaluation - possible  Cellulitis/intertrigo    GERD with aspiration  lidocaine malox GI cocktail q6hprn -carafate and protonix -sp Barium study with speech and swallow -  No worrisome findings   Thank you for allowing me to participate in the care of this patient.   Patient/Family are satisfied with care plan and all questions have been answered.  This document was prepared using Dragon voice recognition software and may include unintentional dictation errors.     Ottie Glazier, M.D.  Division of [place]

## 2020-06-29 NOTE — Progress Notes (Signed)
   Date of Admission:  06/18/2020    ID: Mark [name] is a 69 y.o. male Principal Problem:   Necrotizing pneumonia ([place]) Active Problems:   Anxiety   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 (HCC)   Hypokalemia   Hypothyroidism   Abscess of left lung with pneumonia ([facility])    Subjective: Pt was sleeping and when I woke him up he said he was okay  Medications:  . aspirin EC  81 mg Oral Daily  . clonazePAM  1 mg Oral BID  . enoxaparin (LOVENOX) injection  40 mg Subcutaneous Q24H  . fluticasone furoate-vilanterol  1 puff Inhalation Daily  . guaiFENesin  1,200 mg Oral BID  . ipratropium  2 puff Inhalation Q4H  . levothyroxine  25 mcg Oral QAC breakfast  . lidocaine  15 mL Oral Once  . pantoprazole  40 mg Oral Daily  . senna-docusate  1 tablet Oral BID  . sucralfate  1 g Oral TID WC & HS  . umeclidinium bromide  1 puff Inhalation Daily    Objective: Vital signs in last 24 hours: Temp:  [97.7 F (36.5 C)-98.6 F (37 C)] 97.9 F (36.6 C) (09/27 0741) Pulse Rate:  [102-121] 113 (09/27 0741) Resp:  [18-20] 18 (09/27 0741) BP: (131-136)/(63-76) 132/67 (09/27 0741) SpO2:  [91 %-96 %] 91 % (09/27 0741)  PHYSICAL EXAM:  General: sleeping, on waking - responding  appropriately Lungs: b/l air entry- crepts left side Heart: Regular rate and rhythm, no murmur, rub or gallop. Abdomen: Soft, non-tender,not distended. Bowel sounds normal. No masses Extremities: atraumatic, no cyanosis. No edema. No clubbing Skin: No rashes or lesions. Or bruising Lymph: Cervical, supraclavicular normal. Neurologic: Grossly non-focal  Lab Results Recent Labs    06/28/20 0327 06/29/20 0508  WBC 14.3* 16.0*  HGB 11.1* 11.4*  HCT 36.3* 37.1*  NA 137 133*  K 4.0 3.5  CL 95* 93*  CO2 34* 34*  BUN 9 7*  CREATININE 0.66 0.73      Assessment/Plan: Left lung necrotizing pneumonia with lung abscess- chronic- unasyn-  Aspiration/poor dentition Sputum culture neg- on  unasyn -leucocytosis responding Bronchoscopy/BALmay be needed if he does not respond to unasyn  to  to r.o atypical organisms .Discussed with pulmonologist Dr.A.  Unasyn can be switched to Po augmentin for 2-3 weeks  COPD Pt is asking for COVID vaccine but will only get Moderna Also wants flu vaccine which can be given with Covid vaccine as per CDC guidelines  Discussed the management with care team

## 2020-06-29 NOTE — Progress Notes (Addendum)
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 Repeat CXR shows stable b/l infiltrates. Present on admission. Likely chronic as per pt's hx. Continue on IV unasyn as per ID. AFB is neg. Strept is neg. Patient has capacity to consent as per psych & will need close follow-up with psychiatry as outpatient. Poor prognosis. Hospice saw the pt but pt agreed with SNF currently    COPD: severe & progressive. Continue on IV unasyn, bronchodilators. Respiratory status remains unchanged. Encourage incentive spirometry. Pulmon following and recs apprec. Poor prognosis  Chronic hypoxic respiratory failure: continue on 4L [place] which pt's baseline oxygen level   Thrombocytosis: likely reactive. Will continue to monitor   Leukocytosis: likely secondary to infection. Continue on abxs. Labile   Constipation: continue on colace  Anxiety: severity unknown. Continue on home dose of klonopin   Hypokalemia: within normal limits today.    Onychomycosis & PVD: of b/l LEs. S/p bedside nail debridement as per podiatry. Clean feet w/ normal sterile saline as per podiatry. .  Likely GERD: continue on PPI.    DVT prophylaxis: lovenox  Code Status: DNR Family Communication:  Disposition Plan: PT recs SNF, waiting on a bed at SNF  Status is: Inpatient  Remains inpatient appropriate because:IV treatments appropriate due to intensity of illness or inability to take PO, Hospice saw the pt today but pt agree to SNF. Waiting on a bed at SNF. CM is working on this    Dispo: The patient is from: Home               Anticipated d/c is to: SNF               Anticipated d/c date is: 1 or 2 days               Patient currently is medically stable for d/c    Consultants:   Pulmon  ID   Procedures:   Antimicrobials: unasyn   Subjective: Pt c/o productive cough.   Objective: Vitals:   06/28/20 1722 06/29/20 0122 06/29/20 0407 06/29/20 0741  BP: 133/63 136/76 131/71 132/67  Pulse: (!) 102 (!) 121 (!) 110 (!) 113  Resp:  20 20 18   Temp: 97.7 F (36.5 C) 98.6 F (37 C) 98.4 F (36.9 C) 97.9 F (36.6 C)  TempSrc: Oral  Oral Oral  SpO2: 96% 91% 95% 91%  Weight:      Height:        Intake/Output Summary (Last 24 hours) at 06/29/2020 0757 Last data filed at 06/29/2020 0600 Gross per 24 hour  Intake 900 ml  Output 3100 ml  Net -2200 ml   Filed Weights   06/18/20 2006  Weight: 81.6 kg    Examination:  General exam: Appears calm & comfortable. Disheveled  Respiratory system: course breath sounds b/l. Rhonchi b/l  Cardiovascular system: S1/S2+. No rubs or gallops Gastrointestinal system:  Abd soft, non-tender, non-distended & hypoactive bowel sounds  Central nervous system: Alert and oriented. Moves all 4 extremities  Psychiatry: Judgement and insight appear normal. Tangential thinking     Data Reviewed: I have personally reviewed following labs and  imaging studies  CBC: Recent Labs  Lab 06/23/20 0957 06/23/20 0957 06/25/20 0508 06/26/20 0445 06/27/20 0657 06/28/20 0327 06/29/20 0508  WBC 21.7*   < > 15.0* 15.1* 14.5* 14.3* 16.0*  NEUTROABS 18.9*  --   --   --   --   --   --   HGB 13.4   < > 12.1* 11.4* 11.4* 11.1* 11.4*  HCT 42.1   < > 37.4* 36.5* 35.3* 36.3* 37.1*  MCV 91.1   < > 89.9 93.6 90.5 95.0 93.0  PLT 483*   < > 501* 530* 559* 608* 627*   < > = values in this interval not displayed.   Basic Metabolic Panel: Recent Labs  Lab 06/23/20 0957 06/23/20 0957 06/25/20 2505 06/26/20 0445 06/27/20 0657 06/28/20 0327 06/29/20 0508  NA 135   < > 138 136 133*  137 133*  K 3.6   < > 3.4* 3.7 3.6 4.0 3.5  CL 93*   < > 96* 97* 94* 95* 93*  CO2 34*   < > 33* 32 32 34* 34*  GLUCOSE 142*   < > 102* 107* 128* 132* 171*  BUN 13   < > 11 9 9 9  7*  CREATININE 0.67   < > 0.66 0.60* 0.70 0.66 0.73  CALCIUM 8.0*   < > 7.8* 7.6* 7.9* 7.9* 7.9*  MG 2.1  --   --   --   --   --   --   PHOS 2.6  --   --   --   --   --   --    < > = values in this interval not displayed.   GFR: Estimated Creatinine Clearance: 95.7 mL/min (by C-G formula based on SCr of 0.73 mg/dL). Liver Function Tests: Recent Labs  Lab 06/23/20 0957  AST 13*  ALT 12  ALKPHOS 97  BILITOT 0.7  PROT 5.7*  ALBUMIN 2.1*   No results for input(s): LIPASE, AMYLASE in the last 168 hours. No results for input(s): AMMONIA in the last 168 hours. Coagulation Profile: No results for input(s): INR, PROTIME in the last 168 hours. Cardiac Enzymes: No results for input(s): CKTOTAL, CKMB, CKMBINDEX, TROPONINI in the last 168 hours. BNP (last 3 results) No results for input(s): PROBNP in the last 8760 hours. HbA1C: No results for input(s): HGBA1C in the last 72 hours. CBG: No results for input(s): GLUCAP in the last 168 hours. Lipid Profile: No results for input(s): CHOL, HDL, LDLCALC, TRIG, CHOLHDL, LDLDIRECT in the last 72 hours. Thyroid Function Tests: No results for input(s): TSH, T4TOTAL, FREET4, T3FREE, THYROIDAB in the last 72 hours. Anemia Panel: No results for input(s): VITAMINB12, FOLATE, FERRITIN, TIBC, IRON, RETICCTPCT in the last 72 hours. Sepsis Labs: Recent Labs  Lab 06/23/20 0957 06/24/20 0437 06/25/20 0508  PROCALCITON 0.30 0.26 0.17    Recent Results (from the past 240 hour(s))  Surgical pcr screen     Status: None   Collection Time: 06/22/20  3:22 PM   Specimen: Nasal Mucosa; Nasal Swab  Result Value Ref Range Status   MRSA, PCR NEGATIVE NEGATIVE Final   Staphylococcus aureus NEGATIVE NEGATIVE Final    Comment: (NOTE) The Xpert SA Assay (FDA approved for NASAL  specimens in patients 34 years of age and older), is one component of a comprehensive surveillance program. It is not intended to diagnose infection nor to guide or monitor treatment. Performed at [facility], [address] [place], [place] [address]   Expectorated  sputum assessment w rflx to resp cult     Status: None   Collection Time: 06/28/20  1:10 AM   Specimen: Expectorated Sputum  Result Value Ref Range Status   Specimen Description EXPECTORATED SPUTUM  Final   Special Requests NONE  Final   Sputum evaluation   Final    THIS SPECIMEN IS ACCEPTABLE FOR SPUTUM CULTURE Performed at [facility], [address], [place], [place] [address]    Report Status 06/28/2020 FINAL  Final  Culture, respiratory     Status: None (Preliminary result)   Collection Time: 06/28/20  1:10 AM  Result Value Ref Range Status   Specimen Description   Final    EXPECTORATED SPUTUM Performed at [facility], [address] [place], [place] [address]    Special Requests   Final    NONE Reflexed from [id number] Performed at [facility], [address] [place], [place] [address]    Gram Stain   Final    RARE WBC PRESENT, PREDOMINANTLY PMN NO ORGANISMS SEEN Performed at [facility], [place] [address], [place], [place] [address]    Culture PENDING  Incomplete   Report Status PENDING  Incomplete         Radiology Studies: No results found.      Scheduled Meds: . aspirin EC  81 mg Oral Daily  . clonazePAM  1 mg Oral BID  . enoxaparin (LOVENOX) injection  40 mg Subcutaneous Q24H  . fluticasone furoate-vilanterol  1 puff Inhalation Daily  . guaiFENesin  1,200 mg Oral BID  . ipratropium  2 puff Inhalation Q4H  . levothyroxine  25 mcg Oral QAC breakfast  . lidocaine  15 mL Oral Once  . pantoprazole  40 mg Oral Daily  . senna-docusate  1 tablet Oral BID  . sucralfate  1 g Oral TID WC & HS  . umeclidinium bromide  1 puff Inhalation Daily    Continuous Infusions: . sodium chloride 100 mL/hr at 06/28/20 0038  . ampicillin-sulbactam (UNASYN) IV 3 g (06/29/20 0442)     LOS: 11 days    Time spent: 31 mins    Wyvonnia Dusky, MD [facility] Pager [phone]  If 7PM-7AM, please contact night-coverage [email or web address] 06/29/2020, 7:57 AM

## 2020-06-29 NOTE — Progress Notes (Signed)
NP notified of pt's right arm inflammation and redness from a prior IV site. No new orders at this time. Will continue to monitor. Pt resting in bed.

## 2020-06-29 NOTE — Progress Notes (Signed)
   06/29/20 0122  Assess: MEWS Score  Temp 98.6 F (37 C)  BP 136/76  Pulse Rate (!) 121  Resp 20  SpO2 91 %  O2 Device Nasal Cannula  Assess: MEWS Score  MEWS Temp 0  MEWS Systolic 0  MEWS Pulse 2  MEWS RR 0  MEWS LOC 0  MEWS Score 2  MEWS Score Color Yellow  Assess: if the MEWS score is Yellow or Red  Were vital signs taken at a resting state? Yes  Focused Assessment Change from prior assessment (see assessment flowsheet)  Early Detection of Sepsis Score *See Row Information* Low  MEWS guidelines implemented *See Row Information* Yes  Treat  MEWS Interventions Escalated (See documentation below)  Pain Scale 0-10  Take Vital Signs  Increase Vital Sign Frequency  Yellow: Q 2hr X 2 then Q 4hr X 2, if remains yellow, continue Q 4hrs  Escalate  MEWS: Escalate Yellow: discuss with charge nurse/RN and consider discussing with provider and RRT  Notify: Charge Nurse/RN  Name of Charge Nurse/RN Notified Stanton Kidney, RN  Date Charge Nurse/RN Notified 06/29/20  Time Charge Nurse/RN Notified 0233  Notify: Provider  Provider Name/Title Stark Klein, NP  Date Provider Notified 06/29/20  Time Provider Notified [phone]  Notification Type  (secure message)  Notification Reason Change in status (Yellow MEWS)  Response No new orders  Date of Provider Response 06/29/20  Time of Provider Response [phone]

## 2020-06-29 NOTE — Consult Note (Signed)
Pharmacy Antibiotic Note  Mark [name] is a 69 y.o. male admitted on 06/18/2020 with lung abscess - left lung necrotizing pneumonia with cavitation chronic- mid lung - very likely aspiration. Patient presented with shortness of breath and purulent sputum. PMH includes COPD and chronic respiratory failure on 4L [place], osteoporosis, and asthma. Pharmacy has been consulted for Unasyn dosing.  Day #12 antibiotics (day 8 Unasyn)   Plan: Continue Unasyn 3g IV every 6 hours.    Height: 6' (182.9 cm) Weight: 81.6 kg (180 lb) IBW/kg (Calculated) : 77.6  Temp (24hrs), Avg:98.2 F (36.8 C), Min:97.7 F (36.5 C), Max:98.6 F (37 C)  Recent Labs  Lab 06/25/20 0508 06/26/20 0445 06/27/20 0657 06/28/20 0327 06/29/20 0508  WBC 15.0* 15.1* 14.5* 14.3* 16.0*  CREATININE 0.66 0.60* 0.70 0.66 0.73    Estimated Creatinine Clearance: 95.7 mL/min (by C-G formula based on SCr of 0.73 mg/dL).    No Known Allergies  Antimicrobials this admission: 9/16 vancomycin >> 9/20 9/17 metfronidazole >> 9/20 9/17 ceftriaxone >> 9/19 9/16 cefepime x 1  9/17 Unasyn x 1 9/20 Unasyn >>   Microbiology results: 9/16 BCx: NGTD 9/17 Sputum: pending 9/17 MRSA PCR: negative  Thank you for allowing pharmacy to be a part of this patient's care.  Rowland Lathe, PharmD Clinical Pharmacist 06/29/2020 10:12 AM

## 2020-06-29 NOTE — Progress Notes (Signed)
PT Cancellation Note  Patient Details Name: Mark [name] MRN: [id number] DOB: 08-16-1951   Cancelled Treatment:    Reason Eval/Treat Not Completed: Other (comment)   Attempted x 3 today.  Pt declined for various reasons.  Stated he has had to make several tough decisions today regarding discharge planning.  Encouragement provided.     Chesley Noon 06/29/2020, 1:54 PM

## 2020-06-29 NOTE — Progress Notes (Signed)
[facility] hospital liaison note:  Probation officer was asked to speak with patient regarding hospice services at home. Patient was difficult to keep focused on the information, but did hid voice understanding about what hopsice can do for him at home. He realizes that at this time, he maybe unable to return home. TOC Misty Green present during this visit. Patient had previously agreed to skilled nursing for rehab if a bed at [facility] was available. This option was discussed again and patient was agreeable following discussion with Misty. Plan at the moment is for patient to go to short term rehab with outpatient Palliative. Will continue to follow in the event that plans change. Flo Shanks BSN, RN, [place] [phone]

## 2020-06-30 ENCOUNTER — Ambulatory Visit: Payer: Medicare Other | Attending: Internal Medicine

## 2020-06-30 DIAGNOSIS — Z23 Encounter for immunization: Secondary | ICD-10-CM

## 2020-06-30 DIAGNOSIS — J189 Pneumonia, unspecified organism: Secondary | ICD-10-CM | POA: Diagnosis not present

## 2020-06-30 DIAGNOSIS — J449 Chronic obstructive pulmonary disease, unspecified: Secondary | ICD-10-CM | POA: Diagnosis not present

## 2020-06-30 DIAGNOSIS — J851 Abscess of lung with pneumonia: Secondary | ICD-10-CM | POA: Diagnosis not present

## 2020-06-30 LAB — BASIC METABOLIC PANEL
Anion gap: 9 (ref 5–15)
BUN: 5 mg/dL — ABNORMAL LOW (ref 8–23)
CO2: 36 mmol/L — ABNORMAL HIGH (ref 22–32)
Calcium: 8.3 mg/dL — ABNORMAL LOW (ref 8.9–10.3)
Chloride: 90 mmol/L — ABNORMAL LOW (ref 98–111)
Creatinine, Ser: 0.66 mg/dL (ref 0.61–1.24)
GFR calc Af Amer: >60 mL/min (ref >60)
GFR calc non Af Amer: >60 mL/min (ref >60)
Glucose, Bld: 98 mg/dL (ref 70–99)
Potassium: 3.4 mmol/L — ABNORMAL LOW (ref 3.5–5.1)
Sodium: 135 mmol/L (ref 135–145)

## 2020-06-30 LAB — CBC
HCT: 38.2 % — ABNORMAL LOW (ref 39.0–52.0)
Hemoglobin: 11.7 g/dL — ABNORMAL LOW (ref 13.0–17.0)
MCH: 28.5 pg (ref 26.0–34.0)
MCHC: 30.6 g/dL (ref 30.0–36.0)
MCV: 92.9 fL (ref 80.0–100.0)
Platelets: 701 10*3/uL — ABNORMAL HIGH (ref 150–400)
RBC: 4.11 MIL/uL — ABNORMAL LOW (ref 4.22–5.81)
RDW: 13.6 % (ref 11.5–15.5)
WBC: 12.2 10*3/uL — ABNORMAL HIGH (ref 4.0–10.5)
nRBC: 0 % (ref 0.0–0.2)

## 2020-06-30 LAB — CULTURE, RESPIRATORY W GRAM STAIN

## 2020-06-30 MED ORDER — PANTOPRAZOLE SODIUM 40 MG PO TBEC
40.0000 mg | DELAYED_RELEASE_TABLET | Freq: Every day | ORAL | 0 refills | Status: DC
Start: 2020-07-01 — End: 2020-07-06

## 2020-06-30 MED ORDER — AMOXICILLIN-POT CLAVULANATE 875-125 MG PO TABS: 1.0000 | ORAL_TABLET | Freq: Two times a day (BID) | ORAL | 0 refills | Status: DC

## 2020-06-30 MED ORDER — GUAIFENESIN ER 600 MG PO TB12: 1200.0000 mg | ORAL_TABLET | Freq: Two times a day (BID) | ORAL | 0 refills | Status: DC

## 2020-06-30 MED ORDER — INFLUENZA VAC A&B SA ADJ QUAD 0.5 ML IM PRSY
0.5000 mL | PREFILLED_SYRINGE | INTRAMUSCULAR | Status: DC
  Filled 2020-06-30: qty 0.5

## 2020-06-30 NOTE — Discharge Summary (Deleted)
D/c this note, accidentally opened

## 2020-06-30 NOTE — TOC Progression Note (Signed)
Transition of Care [facility]) - Progression Note    Patient Details  Name: Mark [name] MRN: [id number] Date of Birth: [birth year]  Transition of Care [facility]) CM/SW [place], RN Phone Number: 06/30/2020, 3:36 PM  Clinical Narrative:   RNCM met with patient at bedside to discuss discharge, patient reports that it is his plan to appeal the discharge and he will be making the call soon.     Expected Discharge Plan: [place] Barriers to Discharge: Continued Medical Work up  Expected Discharge Plan and Services Expected Discharge Plan: [place]   Discharge Planning Services: CM Consult Post Acute Care Choice: [place] arrangements for the past 2 months: Single Family Home Expected Discharge Date: 06/30/20                         HH Arranged: RN, PT, OT, Nurse's Aide, Social Work [employer] Agency: [place] ([place]) Date [place]: 06/24/20 Time [place]: 2023 Representative spoke with at [place]: [place] ([place]) Interventions    Readmission Risk Interventions No flowsheet data found.

## 2020-06-30 NOTE — Progress Notes (Signed)
PT Cancellation Note  Patient Details Name: Mark [name] MRN: [id number] DOB: [birth year]   Cancelled Treatment:     PT attempt. 3rd attempt today. Currently refusing PT due to " I received my vaccination today and it is contraindicated to have PT on same day." PT will continue efforts and return tomorrow. Still recommend DC to SNF when medically cleared.   [name] 06/30/2020, 2:48 PM

## 2020-06-30 NOTE — Progress Notes (Signed)
[facility] hospital Liaison note:  New referral for [employer] community Palliative program to follow at [facility] [employer] received from [employer]. Patient information given to referral. Thank you. Flo Shanks BSN, RN, [place] [phone]

## 2020-06-30 NOTE — Care Management Important Message (Signed)
Important Message  Patient Details  Name: Mark [name] MRN: [id number] Date of Birth: Aug 04, 1951   Medicare Important Message Given:  Yes     Dannette Barbara 06/30/2020, 11:01 AM

## 2020-06-30 NOTE — Progress Notes (Signed)
   Covid-19 Vaccination Clinic  Name:  Mark [name]    MRN: [id number] DOB: June 18, 1951  06/30/2020  [name] was observed post Covid-19 immunization for 15 minutes without incident. He was provided with Vaccine Information Sheet and instruction to access the V-Safe system.   [name] was instructed to call 911 with any severe reactions post vaccine: Marland Kitchen Difficulty breathing  . Swelling of face and throat  . A fast heartbeat  . A bad rash all over body  . Dizziness and weakness   Immunizations Administered    Name Date Dose VIS Date Route   Moderna COVID-19 Vaccine 06/30/2020 12:00 PM 0.5 mL 09/2019 Intramuscular   Manufacturer: Moderna   Lot: [id number]   [place]: [phone]

## 2020-06-30 NOTE — Progress Notes (Signed)
Pulmonary Medicine          Date: 06/30/2020,   MRN# [id number] Mark [name] 1951-09-19     AdmissionWeight: 81.6 kg                 CurrentWeight: 81.6 kg   Referring physician: Dr. Manuella Ghazi   CHIEF COMPLAINT:   Necrotizing pneumonia   HISTORY OF PRESENT ILLNESS   Is a pleasant 69 year old male with a history of chronic pain syndrome with opioid-induced constipation, GERD, advanced COPD chronic hypoxemia on 4 L/min supplemental oxygen home O2.  Also has a background history of hypothyroidism, peripheral neuropathy chronic lumbago, reports worsening respiratory status x2 to 3 months with signs and symptoms of acute COPD exacerbation with increased volume and darkening of phlegm on expectoration.  Most recently in the last 2 weeks prior to admission he developed chills diaphoresis and subjective fevers.  In the ER he was found to have leukocytosis, negative COVID-19 test x2, increased O2 requirement from 4 to 5 L/min nasal cannula however venous blood gas was essentially normal BNP and troponin also within reference range.  CT chest shows necrotizing pneumonia with consolidative infiltrate of the left upper and lower lung zones as well as fluid level cystic lesion suggestive of abscess with sorrounding emphysema.  Pulmonary consultation placed for further evaluation and management of necrotizing pneumonia.  He has crusted swollen erythematous feet with intertrigo, he has distended abdomen, poor dentition, and reports broken back. He shares that he has no family and when asked regarding medical proxy he states "im on my own".  Overall he looks chronically ill.    06/20/20- patient had a good night sleep and now is more lucid, hes drinking coffee.  He is speaking rapidly with cicumferential and tangential language. WBC count is with mild trend up.   06/22/20- patient is DNR/DNI after speaking to palliative today - appreciate collaboration. Sputum with GPCs. Patient requests MDI  inhaler we discussed use of Spiriva and Symbicort.  Mild hypokalemia this am, pharmacy consult for repletion. Repeat CXR in am.    06/23/20- patient appears clincally improved this morning.  CXR this am reviewed by me - left lung cavitary pneumonia and superimposed combined pulmonary fibrosis and emphysema. Patient is on 4L/min [place]  06/24/20-patient is similar as yesterday he complains of post prandial substernal chest discomfort, we discussed potential GERD related pain. He has extensive emphysema and will unlikely be a good candidate for bronchoscopy. We will order spirometry to evaluate candidacy for anesthesia.   06/25/20- patient is slightly imrproved this am.  Leukocytosis finally trending down, and he is coughing up more phlegm thickened brown material possibly aspirated food stuffs. He has GERD will order lidocaine GI coctail and carafate.   06/26/20- patient is on 3L/min, he complains of thrush but throat, lingual surface and upper airway are clear. He had barium study yesterday due to complaints of severe dysphagia but that was also unremarkable. He is speaking in full sentences without desaturation and breathing on auscultation is inmproved  Bilateraly.   06/27/20- patient is more conversive, hes saturating >95% on 4L can wean down as able. He is coughing up more brown debris, I will recollect resp culture.  Left and right sided auscultation is imrpoved.   06/29/20- patient is improved, he is on home setting of 3-4L/min.  He is smiling.  He has 4 bananas for lunch, have asked him to pace himself so he does not get constipated or with elelctrolyte derrangement.   06/29/20-  patient is improved he is being discharged today. We discussed post hospitalization follow up. He had COVID19 vaccine in left shoulder today, he denies pain but states "strong onset of fatigue"   PAST MEDICAL HISTORY   Past Medical History:  Diagnosis Date  . Acute respiratory failure with hypoxia and hypercapnia ([place])  05/01/2016  . Asthma   . COPD (chronic obstructive pulmonary disease) ([place])   . Osteoporosis      SURGICAL HISTORY   Past Surgical History:  Procedure Laterality Date  . BACK SURGERY       FAMILY HISTORY   Family History  Problem Relation Age of Onset  . Pulmonary fibrosis Mother      SOCIAL HISTORY   Social History   Tobacco Use  . Smoking status: Former Research scientist (life sciences)  . Smokeless tobacco: Never Used  Substance Use Topics  . Alcohol use: No  . Drug use: No     MEDICATIONS    Home Medication:    Current Medication:  Current Facility-Administered Medications:  .  0.9 %  sodium chloride infusion, , Intravenous, Continuous, Sharion Settler, NP, Last Rate: 100 mL/hr at 06/30/20 1350, New Bag at 06/30/20 1350 .  albuterol (PROVENTIL) (2.5 MG/3ML) 0.083% nebulizer solution 3 mL, 3 mL, Inhalation, Q4H PRN, Lenore Cordia, MD, 3 mL at 06/25/20 0152 .  alum & mag hydroxide-simeth (MAALOX/MYLANTA) 200-200-20 MG/5ML suspension 30 mL, 30 mL, Oral, Q6H PRN **AND** lidocaine (XYLOCAINE) 2 % viscous mouth solution 15 mL, 15 mL, Oral, Once, Tavone Caesar, MD .  amoxicillin-clavulanate (AUGMENTIN) 875-125 MG per tablet 1 tablet, 1 tablet, Oral, Q12H, Tsosie Billing, MD, 1 tablet at 06/30/20 1103 .  aspirin EC tablet 81 mg, 81 mg, Oral, Daily, Kurtis Bushman, Sahar, MD, 81 mg at 06/30/20 1039 .  chlorpheniramine-HYDROcodone (TUSSIONEX) 10-8 MG/5ML suspension 5 mL, 5 mL, Oral, Q4H PRN, Max Sane, MD, 5 mL at 06/30/20 1815 .  clonazePAM (KLONOPIN) tablet 1 mg, 1 mg, Oral, BID, Zada Finders R, MD, 1 mg at 06/30/20 1039 .  enoxaparin (LOVENOX) injection 40 mg, 40 mg, Subcutaneous, Q24H, Zada Finders R, MD, 40 mg at 06/29/20 0958 .  fluticasone (FLONASE) 50 MCG/ACT nasal spray 2 spray, 2 spray, Each Nare, Daily PRN, Kurtis Bushman, Sahar, MD .  fluticasone furoate-vilanterol (BREO ELLIPTA) 200-25 MCG/INH 1 puff, 1 puff, Inhalation, Daily, Nolberto Hanlon, MD, 1 puff at 06/30/20 1106 .  guaiFENesin  (MUCINEX) 12 hr tablet 1,200 mg, 1,200 mg, Oral, BID, Zada Finders R, MD, 1,200 mg at 06/30/20 1109 .  ipratropium (ATROVENT HFA) inhaler 2 puff, 2 puff, Inhalation, Q4H, Wyvonnia Dusky, MD, 2 puff at 06/30/20 1740 .  levothyroxine (SYNTHROID) tablet 25 mcg, 25 mcg, Oral, QAC breakfast, Lenore Cordia, MD, 25 mcg at 06/30/20 0553 .  pantoprazole (PROTONIX) EC tablet 40 mg, 40 mg, Oral, Daily, Wyvonnia Dusky, MD, 40 mg at 06/30/20 1039 .  polyethylene glycol (MIRALAX / GLYCOLAX) packet 17 g, 17 g, Oral, Daily PRN, Nolberto Hanlon, MD, 17 g at 06/22/20 1353 .  senna-docusate (Senokot-S) tablet 1 tablet, 1 tablet, Oral, BID, Nolberto Hanlon, MD, 1 tablet at 06/30/20 1039 .  sucralfate (CARAFATE) tablet 1 g, 1 g, Oral, TID WC & HS, Shirline Kendle, MD, 1 g at 06/30/20 1740 .  [DISCONTINUED] fluticasone furoate-vilanterol (BREO ELLIPTA) 100-25 MCG/INH 1 puff, 1 puff, Inhalation, Daily, 1 puff at 06/22/20 0856 **AND** umeclidinium bromide (INCRUSE ELLIPTA) 62.5 MCG/INH 1 puff, 1 puff, Inhalation, Daily, Lu Duffel, RPH, 1 puff at 06/29/20 1000  ALLERGIES   Patient has no known allergies.     REVIEW OF SYSTEMS    Review of Systems:  Gen:  Denies  fever, sweats, chills weigh loss  HEENT: Denies blurred vision, double vision, ear pain, eye pain, hearing loss, nose bleeds, sore throat Cardiac:  No dizziness, chest pain or heaviness, chest tightness,edema Resp:   Reports SOB Gi: Denies swallowing difficulty, stomach pain, nausea or vomiting, diarrhea, constipation, bowel incontinence Gu:  Denies bladder incontinence, burning urine Ext:   Denies Joint pain, stiffness or swelling Skin: Denies  skin rash, easy bruising or bleeding or hives Endoc:  Denies polyuria, polydipsia , polyphagia or weight change Psych:   Denies depression, insomnia or hallucinations   Other:  All other systems negative   VS: BP 113/71 (BP Location: Right Arm)   Pulse (!) 104   Temp 98.4 F (36.9  C)   Resp 20   Ht 6' (1.829 m)   Wt 81.6 kg   SpO2 94%   BMI 24.41 kg/m      PHYSICAL EXAM    GENERAL:NAD, no fevers, chills, no weakness no fatigue HEAD: Normocephalic, atraumatic.  EYES: Pupils equal, round, reactive to light. Extraocular muscles intact. No scleral icterus.  MOUTH: Moist mucosal membrane. Dentition intact. No abscess noted.  EAR, NOSE, THROAT: Clear without exudates. No external lesions.  NECK: Supple. No thyromegaly. No nodules. No JVD.  PULMONARY:rhonchi bilaterally  CARDIOVASCULAR: S1 and S2. Regular rate and rhythm. No murmurs, rubs, or gallops. No edema. Pedal pulses 2+ bilaterally.  GASTROINTESTINAL: Soft, nontender, nondistended. No masses. Positive bowel sounds. No hepatosplenomegaly.  MUSCULOSKELETAL: No swelling, clubbing, or edema. Range of motion full in all extremities.  NEUROLOGIC: Cranial nerves II through XII are intact. No gross focal neurological deficits. Sensation intact. Reflexes intact.  SKIN: crusted feet with erythemaouts intertrigo. PSYCHIATRIC: Mood, affect within normal limits. The patient is awake, alert and oriented x 3. Insight, judgment intact.       IMAGING    CT Chest W Contrast  Result Date: 06/18/2020 CLINICAL DATA:  69 year old male with shortness of breath, productive cough, pneumonia. On home oxygen. EXAM: CT CHEST WITH CONTRAST TECHNIQUE: Multidetector CT imaging of the chest was performed during intravenous contrast administration. CONTRAST:  36mL OMNIPAQUE IOHEXOL 300 MG/ML  SOLN COMPARISON:  Portable chest earlier today.  Chest CT 01/17/2020. FINDINGS: Cardiovascular: No cardiomegaly or pericardial effusion. Calcified coronary artery and aortic atherosclerosis. The central vascular structures of the mediastinum are enhancing and appear to be patent. Mediastinum/Nodes: Mildly increased in reactive appearing mediastinal lymph nodes compared to 2020, up to 14 mm short axis. Some nodes (right paratracheal on series 2, image  47) have not significantly changed. Additionally, there is evidence of a larger 15 mm short axis left AP window node. Lungs/Pleura: Chronic pulmonary hyperinflation. Extensive chronic emphysema. Previous bullous emphysema in the left upper lung where now there is extensive consolidation with numerous small rounded areas of opacity suspicious for cavitation. There is a prominent fluid level lateral to the left hilum on series 3, image 65 suspicious for lung abscess. Abnormal interstitial thickening and peribronchial nodularity throughout the left lung. No superimposed pneumothorax. Only trace left pleural fluid. The major airways remain patent. Right lung emphysema and markings appear stable since last year. Upper Abdomen: Negative visible liver, gallbladder, spleen, pancreas, adrenal glands, kidneys, and bowel in the upper abdomen. Musculoskeletal: Chronic compression fractures and ankylosis in the spine, with several levels of previous vertebral body augmentation, appears stable since last year. Occasional chronic  rib fractures. No acute osseous abnormality identified. No chest wall abnormality. IMPRESSION: 1. 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 Consider necrotizing pneumonia as multiple areas are suspicious for cavitation, and especially a fluid-level lateral to the left hilum (series 2, image 68) strongly suggesting Lung Abscess. 2. Right lung unaffected.  Only trace left pleural fluid. 3. Reactive appearing mediastinal lymph nodes. 4. Calcified coronary artery and Aortic Atherosclerosis (ICD10-I70.0). Emphysema (ICD10-J43.9). Electronically Signed   By: Genevie Ann M.D.   On: 06/18/2020 22:14   DG Chest Port 1 View  Result Date: 06/26/2020 CLINICAL DATA:  Shortness of breath. EXAM: PORTABLE CHEST 1 VIEW COMPARISON:  June 23, 2020. FINDINGS: The heart size and mediastinal contours are within normal limits. No pneumothorax or pleural effusion is noted. Stable diffuse left  lung opacities are noted consistent with pneumonia. Stable right basilar opacity is noted concerning for worsening atelectasis or pneumonia. The visualized skeletal structures are unremarkable. IMPRESSION: Stable bilateral lung opacities are noted consistent with multifocal pneumonia. Electronically Signed   By: Marijo Conception M.D.   On: 06/26/2020 08:57   DG Chest Port 1 View  Result Date: 06/23/2020 CLINICAL DATA:  Pneumonia EXAM: PORTABLE CHEST 1 VIEW COMPARISON:  June 18, 2020 chest radiograph and chest CT FINDINGS: Extensive airspace opacity is noted throughout the left upper and left mid lung regions with areas of apparent cavitation. Less discrete infiltrate is noted throughout the remainder of the left lung, stable. There is underlying emphysematous change. Scattered areas of scarring noted on the right without edema or airspace opacity. Heart size and pulmonary vascularity are normal. No adenopathy. No bone lesions. IMPRESSION: Underlying emphysema. Widespread airspace opacity on the left with areas of cavitary pneumonia throughout the left upper and mid lung regions, similar to recent prior studies. Underlying fibrosis elsewhere, most severe left lower lung region. Scattered areas of scarring on the right without edema or airspace opacity. Stable cardiac silhouette. Electronically Signed   By: Lowella Grip III M.D.   On: 06/23/2020 08:37   DG Chest Portable 1 View  Result Date: 06/18/2020 CLINICAL DATA:  Shortness of breath all summer lung, worsening last week, productive cough, congestion EXAM: PORTABLE CHEST 1 VIEW COMPARISON:  CT 01/17/2019, radiograph 01/17/2019 FINDINGS: There is a background chronic reticular and fibrotic interstitial changes throughout both lungs albeit with new area of superimposed consolidative opacity spanning the left mid lung to apex including an area of potential cavitation seen more peripherally. No discernible pneumothorax or visible effusion.  Cardiomediastinal contours are partially obscured by overlying opacity with the visible borders similar in appearance to the comparison studies from prior. No acute osseous or soft tissue abnormality. Degenerative changes are present in the imaged spine and shoulders. Telemetry leads overlie the chest. IMPRESSION: Consolidative opacity in the left mid lung to apex with some questionable cavitation peripherally. Recommend further characterization with CT of the chest with contrast if patient is able to tolerate. Findings superimposed on chronic reticular and fibrotic interstitial changes. These results were called by telephone at the time of interpretation on 06/18/2020 at 8:45 pm to provider [facility] , who verbally acknowledged these results. Electronically Signed   By: Lovena Le M.D.   On: 06/18/2020 20:45   DG Swallowing Func-Speech Pathology  Result Date: 06/25/2020 Objective Swallowing Evaluation: Type of Study: MBS-Modified Barium Swallow Study  Patient Details Name: Badr Piedra MRN: [id number] Date of Birth: [birth year] Today's Date: 06/25/2020 Time: SLP Start Time (ACUTE ONLY): 0539 -SLP Stop Time (ACUTE ONLY):  0905 SLP Time Calculation (min) (ACUTE ONLY): 30 min Past Medical History: Past Medical History: Diagnosis Date . Acute respiratory failure with hypoxia and hypercapnia ([place]) 05/01/2016 . Asthma  . COPD (chronic obstructive pulmonary disease) ([place])  . Osteoporosis  Past Surgical History: Past Surgical History: Procedure Laterality Date . BACK SURGERY   HPI: Mr Avitabile is a pleasant 69 year old male with a history of chronic pain syndrome with opioid-induced constipation, GERD, advanced COPD chronic hypoxemia on 4 L/min supplemental oxygen home O2.  Also has a background history of hypothyroidism, peripheral neuropathy chronic lumbago, reports worsening respiratory status x2 to 3 months with signs and symptoms of acute COPD exacerbation with increased volume and darkening of phlegm on  expectoration.  CT chest shows necrotizing pneumonia with consolidative infiltrate of the left upper and lower lung zones as well as fluid level cystic lesion suggestive of abscess with sorrounding emphysema.   Subjective: pt pleasant, conversant Assessment / Plan / Recommendation CHL IP CLINICAL IMPRESSIONS 06/25/2020 Clinical Impression Pt demonstrated grossly functional oropharyngeal abilities when consuming puree, solids, whole barium tablet, thin liquids via cup and nectar thick liquids via cup. On imaging pt presents with moderate oral phase deficits that are c/b decrased bolus management. However after talking with pt, it appears this is an incidental finding as pt reports this to be "the way I have always chewed." When consuming the above PO intake, penetration and aspiraiton were not observed. Pt reports that he doesn't have issues with swallow but after he swallows "it feels like Kerosene is going down my esophagus." He reports raw sensation. At this time, pt appears appropriate to continue on current diet. Extensive education provided on oral care (regardless of only having 2 teeth). Skilled ST intervention is not indicated at this time.  SLP Visit Diagnosis Dysphagia, unspecified (R13.10) Attention and concentration deficit following -- Frontal lobe and executive function deficit following -- Impact on safety and function Mild aspiration risk   CHL IP TREATMENT RECOMMENDATION 06/25/2020 Treatment Recommendations No treatment recommended at this time   No flowsheet data found. CHL IP DIET RECOMMENDATION 06/25/2020 SLP Diet Recommendations Regular solids;Thin liquid Liquid Administration via Cup Medication Administration Whole meds with liquid Compensations Minimize environmental distractions;Slow rate;Small sips/bites Postural Changes Seated upright at 90 degrees   CHL IP OTHER RECOMMENDATIONS 06/25/2020 Recommended Consults -- Oral Care Recommendations Oral care BID Other Recommendations --   CHL IP FOLLOW  UP RECOMMENDATIONS 06/25/2020 Follow up Recommendations None   No flowsheet data found.     CHL IP ORAL PHASE 06/25/2020 Oral Phase WFL Oral - Pudding Teaspoon -- Oral - Pudding Cup -- Oral - Honey Teaspoon -- Oral - Honey Cup -- Oral - Nectar Teaspoon -- Oral - Nectar Cup -- Oral - Nectar Straw -- Oral - Thin Teaspoon -- Oral - Thin Cup -- Oral - Thin Straw -- Oral - Puree -- Oral - Mech Soft -- Oral - Regular -- Oral - Multi-Consistency -- Oral - Pill -- Oral Phase - Comment --  CHL IP PHARYNGEAL PHASE 06/25/2020 Pharyngeal Phase WFL Pharyngeal- Pudding Teaspoon -- Pharyngeal -- Pharyngeal- Pudding Cup -- Pharyngeal -- Pharyngeal- Honey Teaspoon -- Pharyngeal -- Pharyngeal- Honey Cup -- Pharyngeal -- Pharyngeal- Nectar Teaspoon -- Pharyngeal -- Pharyngeal- Nectar Cup -- Pharyngeal -- Pharyngeal- Nectar Straw -- Pharyngeal -- Pharyngeal- Thin Teaspoon -- Pharyngeal -- Pharyngeal- Thin Cup -- Pharyngeal -- Pharyngeal- Thin Straw -- Pharyngeal -- Pharyngeal- Puree -- Pharyngeal -- Pharyngeal- Mechanical Soft -- Pharyngeal -- Pharyngeal- Regular -- Pharyngeal -- Pharyngeal- Multi-consistency --  Pharyngeal -- Pharyngeal- Pill -- Pharyngeal -- Pharyngeal Comment --  CHL IP CERVICAL ESOPHAGEAL PHASE 06/25/2020 Cervical Esophageal Phase WFL Pudding Teaspoon -- Pudding Cup -- Honey Teaspoon -- Honey Cup -- Nectar Teaspoon -- Nectar Cup -- Nectar Straw -- Thin Teaspoon -- Thin Cup -- Thin Straw -- Puree -- Mechanical Soft -- Regular -- Multi-consistency -- Pill -- Cervical Esophageal Comment -- Happi Overton 06/25/2020, 1:00 PM                 ASSESSMENT/PLAN   Left lung necrortizing pneumonia with abscess -patient with poor dentition possible anaerobic etiology -will send off sputum cultures patient states he can cough phlegm -he is altered with encephalopathy -sputum culture x 3->>>> +GPCs  -AFB sputum-negatve -histo/strep pneumo/legionella urine ag -blood cultures-neg to date -MRSA nasal PCR -procalcitonin  trend -defer antimicrobial regimen to ID - currently on Unasyn>>rocephin +flagyl +vancomycin>>>Unasyn  -poor prognosis - palliative evaluation  -follow up in clinic 2wks - [facility]   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)  - chronic hypoxemia with increased O2 requirement  -his emphysema is beyond repair and patient has very poor prognosis -typical COPD carepath with duoneb, bronchopulmonary hygiene, steroids and antibiotics is appropriate for inpatient therapy   Altered mental status with confusion   - possible septic encephalitis, patient admits to drinking alcohol  - VBG does not support hypercarbic encephalopathy  -patient does not lack capacity and has had psychiatric evaluation    Lower extermity foot wounds  - wound care  And podiatry evaluation - possible  Cellulitis/intertrigo    GERD with aspiration  lidocaine malox GI cocktail q6hprn -carafate and protonix -sp Barium study with speech and swallow -  No worrisome findings   Thank you for allowing me to participate in the care of this patient.   Patient/Family are satisfied with care plan and all questions have been answered.  This document was prepared using Dragon voice recognition software and may include unintentional dictation errors.     Ottie Glazier, M.D.  Division of [place]

## 2020-06-30 NOTE — Progress Notes (Signed)
ID  Pt feeling okay Got his first dose of Moderna vaccine No pain Some fatigue  Patient Vitals for the past 24 hrs:  BP Temp Temp src Pulse Resp SpO2  06/30/20 1618 113/71 98.4 F (36.9 C) -- (!) 104 20 94 %  06/30/20 0726 108/64 98.7 F (37.1 C) Oral (!) 109 17 90 %  06/29/20 2356 (!) 100/57 98.6 F (37 C) Oral 99 17 98 %  awake and alert Chest b/l air entry crepts both sides Abd soft CNS non focal  Labs CBC Latest Ref Rng & Units 06/30/2020 06/29/2020 06/28/2020  WBC 4.0 - 10.5 K/uL 12.2(H) 16.0(H) 14.3(H)  Hemoglobin 13.0 - 17.0 g/dL 11.7(L) 11.4(L) 11.1(L)  Hematocrit 39 - 52 % 38.2(L) 37.1(L) 36.3(L)  Platelets 150 - 400 K/uL 701(H) 627(H) 608(H)   CMP Latest Ref Rng & Units 06/30/2020 06/29/2020 06/28/2020  Glucose 70 - 99 mg/dL 98 171(H) 132(H)  BUN 8 - 23 mg/dL 5(L) 7(L) 9  Creatinine 0.61 - 1.24 mg/dL 0.66 0.73 0.66  Sodium 135 - 145 mmol/L 135 133(L) 137  Potassium 3.5 - 5.1 mmol/L 3.4(L) 3.5 4.0  Chloride 98 - 111 mmol/L 90(L) 93(L) 95(L)  CO2 22 - 32 mmol/L 36(H) 34(H) 34(H)  Calcium 8.9 - 10.3 mg/dL 8.3(L) 7.9(L) 7.9(L)  Total Protein 6.5 - 8.1 g/dL - - -  Total Bilirubin 0.3 - 1.2 mg/dL - - -  Alkaline Phos 38 - 126 U/L - - -  AST 15 - 41 U/L - - -  ALT 0 - 44 U/L - - -    Impression/recommendation Left lung necrotizing pneumonia with lung abscess- chronic-unasyn- Aspiration/poor dentition Sputum culture neg-on unasyn -leucocytosis responding Bronchoscopy/BALmay be needed if he does not respond to unasyn to to r.o atypical organisms .Discussed with pulmonologist Dr.A.  Unasyn  switched to Po augmentin for 3 weeks.  COPD Covid vaccine - got  Moderna today Flu vaccine ordered for tomorrow   Discussed the management with patient and Dr.Aleskerov

## 2020-06-30 NOTE — Discharge Summary (Addendum)
Physician Discharge Summary  Mark [name] EYC:[id number] DOB: 03/17/1951 DOA: 06/18/2020  PCP: Mark Cockayne, MD  Admit date: 06/18/2020 Discharge date: 06/30/2020  Admitted From: Home  Disposition:  SNF  Recommendations for Outpatient Follow-up:  1. Follow up with PCP in 1-2 weeks 2. F/u pulmon, Mark [name] in 2 weeks 3. F/u ID, Mark [name] in 2 weeks  4. F/u palliative care within 1 week   Home Health: no  Equipment/Devices: chronically on 4L [place]   Discharge Condition: stable  CODE STATUS: DNR  Diet recommendation: Heart Healthy  Brief/Interim Summary: HPI was taken from Mark [name]: Mark [name] is a 69 y.o. male with medical history significant for severe COPD and chronic respiratory failure with hypoxia on 4 L supplemental O2 via [place] chronically, hypothyroidism, and anxiety on chronic benzodiazepines who presents to the ED for evaluation of progressive shortness of breath.  Patient states he has been feeling unwell all summer long.  He says he has been intolerant to the heat and pre much confined to his apartment.  He has been getting progressively worsening shortness of breath and dyspnea on exertion.  At baseline he has a chronic cough usually productive of clear sputum however the last 2 weeks he has had purulent yellow malodorous sputum production.  He reports subjective fevers, chills, diaphoresis.  He is a former smoker and states he quit at the beginning of the COVID-19 pandemic.  He reports a history of right-sided lung abscess in 2009.  He says that he has become progressively weak and has difficulty getting out of bed recently to even go to the bathroom.  ED Course:  Initial vitals showed BP 153/90, pulse 120, RR 22, temp 98.5 Fahrenheit, SPO2 99% initially on nonrebreather.  He was weaned down to his home 4 L supplemental O2 via [place] maintaining O2 saturations >88%.  Labs showed WBC 22.9, hemoglobin 14.0, platelets 474,000, sodium 134, potassium 3.4,  bicarb 28, BUN 20, creatinine 0.69, serum glucose 121, BNP 238.2, high-sensitivity troponin I 26.  VBG showed pH 7.4, PCO2 57, PO2 119.  Lactic acid 1.1.  SARS-CoV-2 PCR is negative.  Blood cultures were obtained and pending.  Portable chest x-ray showed consolidative opacity in the left midlung with cavitary changes.  CT chest with contrast was obtained and showed chronic severe emphysema with superimposed left multilobar pneumonia and findings suggestive of necrotizing pneumonia with multiple areas suspicious for cavitation and suspected lung abscess lateral to the left hilum.  Patient was given IV vancomycin and cefepime, IV Solu-Medrol 125 mg, 1 L LR, and DuoNeb treatment.  The hospitalist service was consulted to admit for further evaluation and management  Hospital Course from Dr. Lenna Sciara. Mark [name] 9/22-9/28/21: Pt was found to have necrotizing pneumonia w/ lung abscess thought to be chronic as per pt's hx. Pt has received IV abxs while inpatient and currently on IV unasyn as per ID. Pt will transition to po augmentin for 2 weeks and may need longer as per ID. Pt has end stage COPD and hospice care saw the pt. Palliative care will f/u outpatient with pt. Also, PT/OT saw the pt and recommended SNF. Pt did receive 1st dose Moderna COVID19 vaccine while inpatient on 06/30/20. For more information, please see previous progress notes.   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 Repeat CXR shows stable b/l infiltrates. Present on admission. Likely chronic as per pt's hx. Continue on IV unasyn as per ID. AFB is neg. Strept is neg. Patient has capacity to consent as per psych & will need close follow-up with psychiatry  as outpatient. Poor prognosis. Hospice saw the pt but pt agreed with SNF currently    COPD: severe & progressive. Continue on IV unasyn, bronchodilators. Respiratory status remains unchanged. Encourage incentive spirometry. Pulmon following and recs apprec. Poor prognosis  Chronic hypoxic respiratory failure: continue on 4L [place] which pt's baseline oxygen level   Thrombocytosis: likely reactive. Will continue to monitor   Leukocytosis: likely secondary to infection. Continue on abxs. Labile   Constipation: continue on colace  Anxiety: severity unknown. Continue on home dose of klonopin   Hypokalemia: within normal limits today.    Onychomycosis & PVD: of b/l LEs. S/p bedside nail debridement as per podiatry. Clean feet w/ normal sterile saline as per podiatry. .  Likely GERD: continue on PPI.   Discharge Instructions  Discharge Instructions    Diet - low sodium heart healthy   Complete by: As directed    Discharge instructions   Complete by: As directed    F/u PCP in 1-2 weeks. F/u pulmon, Mark [name], in 2 weeks. F/u ID, Mark [name], in 1-2 weeks   Increase activity slowly   Complete by: As directed      Allergies as of 06/30/2020   No Known Allergies     Medication List    TAKE these medications   albuterol 108 (90 Base) MCG/ACT inhaler Commonly known as: VENTOLIN HFA Inhale 2 puffs into the lungs every 4 (four) hours as needed for wheezing or shortness of breath.   amoxicillin-clavulanate 875-125 MG tablet Commonly known as: AUGMENTIN Take 1 tablet by mouth every 12 (twelve) hours for 14 days.   aspirin 81 MG tablet Take 81 mg by mouth daily.   budesonide-formoterol 160-4.5 MCG/ACT inhaler Commonly known as: SYMBICORT Inhale 2 puffs into the lungs 2 (two) times daily. Notes to patient: Not given this hospitalization   clonazePAM 1 MG tablet Commonly known as: KLONOPIN Take 1 mg by mouth 2 (two) times daily.   cyclobenzaprine 10 MG tablet Commonly  known as: FLEXERIL Take 10 mg by mouth 3 (three) times daily as needed for muscle spasms. Notes to patient: Not given this hospitalization   diphenhydrAMINE 25 mg capsule Commonly known as: BENADRYL Take 25-50 mg by mouth every 6 (six) hours as needed for allergies.   fluticasone 50 MCG/ACT nasal spray Commonly known as: FLONASE Place 2 sprays into both nostrils daily as needed for allergies.   guaiFENesin 600 MG 12 hr tablet Commonly known as: MUCINEX Take 2 tablets (1,200 mg total) by mouth 2 (two) times daily for 14 days.   ipratropium 17 MCG/ACT inhaler Commonly known as: ATROVENT HFA Inhale 2 puffs into the lungs every 6 (six) hours.   levothyroxine 25 MCG tablet Commonly known as: SYNTHROID Take 25 mcg by mouth daily before breakfast.   pantoprazole 40 MG tablet Commonly known as: PROTONIX Take 1 tablet (40 mg total) by mouth daily. Start taking on: July 01, 2020   Primatene Mist 0.125 MG/ACT Aero Generic drug: EPINEPHrine Inhale 1 Dose into the lungs as needed (allergies).   tiotropium 18 MCG inhalation capsule Commonly known as: SPIRIVA Place 18 mcg into inhaler and inhale daily.   Trelegy Ellipta 100-62.5-25 MCG/INH Aepb Generic drug:  Fluticasone-Umeclidin-Vilant Inhale 1 puff into the lungs daily. Notes to patient: Not given this hospitalization       No Known Allergies  Consultations:  Pulmon  ID  Hospice/palliative care    Procedures/Studies: CT Chest W Contrast  Result Date: 06/18/2020 CLINICAL DATA:  69 year old male with shortness of breath, productive cough, pneumonia. On home oxygen. EXAM: CT CHEST WITH CONTRAST TECHNIQUE: Multidetector CT imaging of the chest was performed during intravenous contrast administration. CONTRAST:  38mL OMNIPAQUE IOHEXOL 300 MG/ML  SOLN COMPARISON:  Portable chest earlier today.  Chest CT 01/17/2020. FINDINGS: Cardiovascular: No cardiomegaly or pericardial effusion. Calcified coronary artery and aortic  atherosclerosis. The central vascular structures of the mediastinum are enhancing and appear to be patent. Mediastinum/Nodes: Mildly increased in reactive appearing mediastinal lymph nodes compared to 2020, up to 14 mm short axis. Some nodes (right paratracheal on series 2, image 47) have not significantly changed. Additionally, there is evidence of a larger 15 mm short axis left AP window node. Lungs/Pleura: Chronic pulmonary hyperinflation. Extensive chronic emphysema. Previous bullous emphysema in the left upper lung where now there is extensive consolidation with numerous small rounded areas of opacity suspicious for cavitation. There is a prominent fluid level lateral to the left hilum on series 3, image 65 suspicious for lung abscess. Abnormal interstitial thickening and peribronchial nodularity throughout the left lung. No superimposed pneumothorax. Only trace left pleural fluid. The major airways remain patent. Right lung emphysema and markings appear stable since last year. Upper Abdomen: Negative visible liver, gallbladder, spleen, pancreas, adrenal glands, kidneys, and bowel in the upper abdomen. Musculoskeletal: Chronic compression fractures and ankylosis in the spine, with several levels of previous vertebral body augmentation, appears stable since last year. Occasional chronic rib fractures. No acute osseous abnormality identified. No chest wall abnormality. IMPRESSION: 1. 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 Consider necrotizing pneumonia as multiple areas are suspicious for cavitation, and especially a fluid-level lateral to the left hilum (series 2, image 68) strongly suggesting Lung Abscess. 2. Right lung unaffected.  Only trace left pleural fluid. 3. Reactive appearing mediastinal lymph nodes. 4. Calcified coronary artery and Aortic Atherosclerosis (ICD10-I70.0). Emphysema (ICD10-J43.9). Electronically Signed   By: Genevie Ann M.D.   On: 06/18/2020 22:14   DG Chest  Port 1 View  Result Date: 06/26/2020 CLINICAL DATA:  Shortness of breath. EXAM: PORTABLE CHEST 1 VIEW COMPARISON:  June 23, 2020. FINDINGS: The heart size and mediastinal contours are within normal limits. No pneumothorax or pleural effusion is noted. Stable diffuse left lung opacities are noted consistent with pneumonia. Stable right basilar opacity is noted concerning for worsening atelectasis or pneumonia. The visualized skeletal structures are unremarkable. IMPRESSION: Stable bilateral lung opacities are noted consistent with multifocal pneumonia. Electronically Signed   By: Marijo Conception M.D.   On: 06/26/2020 08:57   DG Chest Port 1 View  Result Date: 06/23/2020 CLINICAL DATA:  Pneumonia EXAM: PORTABLE CHEST 1 VIEW COMPARISON:  June 18, 2020 chest radiograph and chest CT FINDINGS: Extensive airspace opacity is noted throughout the left upper and left mid lung regions with areas of apparent cavitation. Less discrete infiltrate is noted throughout the remainder of the left lung, stable. There is underlying emphysematous change. Scattered areas of scarring noted on the right without edema or airspace opacity. Heart size and pulmonary vascularity are normal. No adenopathy. No bone lesions. IMPRESSION: Underlying emphysema. Widespread airspace opacity on the left with areas of cavitary pneumonia throughout the left upper and mid lung regions,  similar to recent prior studies. Underlying fibrosis elsewhere, most severe left lower lung region. Scattered areas of scarring on the right without edema or airspace opacity. Stable cardiac silhouette. Electronically Signed   By: Lowella Grip III M.D.   On: 06/23/2020 08:37   DG Chest Portable 1 View  Result Date: 06/18/2020 CLINICAL DATA:  Shortness of breath all summer lung, worsening last week, productive cough, congestion EXAM: PORTABLE CHEST 1 VIEW COMPARISON:  CT 01/17/2019, radiograph 01/17/2019 FINDINGS: There is a background chronic  reticular and fibrotic interstitial changes throughout both lungs albeit with new area of superimposed consolidative opacity spanning the left mid lung to apex including an area of potential cavitation seen more peripherally. No discernible pneumothorax or visible effusion. Cardiomediastinal contours are partially obscured by overlying opacity with the visible borders similar in appearance to the comparison studies from prior. No acute osseous or soft tissue abnormality. Degenerative changes are present in the imaged spine and shoulders. Telemetry leads overlie the chest. IMPRESSION: Consolidative opacity in the left mid lung to apex with some questionable cavitation peripherally. Recommend further characterization with CT of the chest with contrast if patient is able to tolerate. Findings superimposed on chronic reticular and fibrotic interstitial changes. These results were called by telephone at the time of interpretation on 06/18/2020 at 8:45 pm to provider [facility] , who verbally acknowledged these results. Electronically Signed   By: Lovena Le M.D.   On: 06/18/2020 20:45   DG Swallowing Func-Speech Pathology  Result Date: 06/25/2020 Objective Swallowing Evaluation: Type of Study: MBS-Modified Barium Swallow Study  Patient Details Name: Mark [name] MRN: [id number] Date of Birth: September 28, 1951 Today's Date: 06/25/2020 Time: SLP Start Time (ACUTE ONLY): 4098 -SLP Stop Time (ACUTE ONLY): 0905 SLP Time Calculation (min) (ACUTE ONLY): 30 min Past Medical History: Past Medical History: Diagnosis Date . Acute respiratory failure with hypoxia and hypercapnia ([place]) 05/01/2016 . Asthma  . COPD (chronic obstructive pulmonary disease) ([place])  . Osteoporosis  Past Surgical History: Past Surgical History: Procedure Laterality Date . BACK SURGERY   HPI: Mr Hart is a pleasant 69 year old male with a history of chronic pain syndrome with opioid-induced constipation, GERD, advanced COPD chronic hypoxemia on 4 L/min  supplemental oxygen home O2.  Also has a background history of hypothyroidism, peripheral neuropathy chronic lumbago, reports worsening respiratory status x2 to 3 months with signs and symptoms of acute COPD exacerbation with increased volume and darkening of phlegm on expectoration.  CT chest shows necrotizing pneumonia with consolidative infiltrate of the left upper and lower lung zones as well as fluid level cystic lesion suggestive of abscess with sorrounding emphysema.   Subjective: pt pleasant, conversant Assessment / Plan / Recommendation CHL IP CLINICAL IMPRESSIONS 06/25/2020 Clinical Impression Pt demonstrated grossly functional oropharyngeal abilities when consuming puree, solids, whole barium tablet, thin liquids via cup and nectar thick liquids via cup. On imaging pt presents with moderate oral phase deficits that are c/b decrased bolus management. However after talking with pt, it appears this is an incidental finding as pt reports this to be "the way I have always chewed." When consuming the above PO intake, penetration and aspiraiton were not observed. Pt reports that he doesn't have issues with swallow but after he swallows "it feels like Kerosene is going down my esophagus." He reports raw sensation. At this time, pt appears appropriate to continue on current diet. Extensive education provided on oral care (regardless of only having 2 teeth). Skilled ST intervention is not indicated at this time.  SLP Visit Diagnosis Dysphagia, unspecified (R13.10) Attention and concentration deficit following -- Frontal lobe and executive function deficit following -- Impact on safety and function Mild aspiration risk   CHL IP TREATMENT RECOMMENDATION 06/25/2020 Treatment Recommendations No treatment recommended at this time   No flowsheet data found. CHL IP DIET RECOMMENDATION 06/25/2020 SLP Diet Recommendations Regular solids;Thin liquid Liquid Administration via Cup Medication Administration Whole meds with liquid  Compensations Minimize environmental distractions;Slow rate;Small sips/bites Postural Changes Seated upright at 90 degrees   CHL IP OTHER RECOMMENDATIONS 06/25/2020 Recommended Consults -- Oral Care Recommendations Oral care BID Other Recommendations --   CHL IP FOLLOW UP RECOMMENDATIONS 06/25/2020 Follow up Recommendations None   No flowsheet data found.     CHL IP ORAL PHASE 06/25/2020 Oral Phase WFL Oral - Pudding Teaspoon -- Oral - Pudding Cup -- Oral - Honey Teaspoon -- Oral - Honey Cup -- Oral - Nectar Teaspoon -- Oral - Nectar Cup -- Oral - Nectar Straw -- Oral - Thin Teaspoon -- Oral - Thin Cup -- Oral - Thin Straw -- Oral - Puree -- Oral - Mech Soft -- Oral - Regular -- Oral - Multi-Consistency -- Oral - Pill -- Oral Phase - Comment --  CHL IP PHARYNGEAL PHASE 06/25/2020 Pharyngeal Phase WFL Pharyngeal- Pudding Teaspoon -- Pharyngeal -- Pharyngeal- Pudding Cup -- Pharyngeal -- Pharyngeal- Honey Teaspoon -- Pharyngeal -- Pharyngeal- Honey Cup -- Pharyngeal -- Pharyngeal- Nectar Teaspoon -- Pharyngeal -- Pharyngeal- Nectar Cup -- Pharyngeal -- Pharyngeal- Nectar Straw -- Pharyngeal -- Pharyngeal- Thin Teaspoon -- Pharyngeal -- Pharyngeal- Thin Cup -- Pharyngeal -- Pharyngeal- Thin Straw -- Pharyngeal -- Pharyngeal- Puree -- Pharyngeal -- Pharyngeal- Mechanical Soft -- Pharyngeal -- Pharyngeal- Regular -- Pharyngeal -- Pharyngeal- Multi-consistency -- Pharyngeal -- Pharyngeal- Pill -- Pharyngeal -- Pharyngeal Comment --  CHL IP CERVICAL ESOPHAGEAL PHASE 06/25/2020 Cervical Esophageal Phase WFL Pudding Teaspoon -- Pudding Cup -- Honey Teaspoon -- Honey Cup -- Nectar Teaspoon -- Nectar Cup -- Nectar Straw -- Thin Teaspoon -- Thin Cup -- Thin Straw -- Puree -- Mechanical Soft -- Regular -- Multi-consistency -- Pill -- Cervical Esophageal Comment -- Happi [place]ton 06/25/2020, 1:00 PM                  Subjective: Pt c/o malaise    Discharge Exam: Vitals:   06/29/20 2356 06/30/20 0726  BP: (!) 100/57 108/64   Pulse: 99 (!) 109  Resp: 17 17  Temp: 98.6 F (37 C) 98.7 F (37.1 C)  SpO2: 98% 90%   Vitals:   06/29/20 0741 06/29/20 1528 06/29/20 2356 06/30/20 0726  BP: 132/67 122/72 (!) 100/57 108/64  Pulse: (!) 113 (!) 110 99 (!) 109  Resp: 18 18 17 17   Temp: 97.9 F (36.6 C) 97.8 F (36.6 C) 98.6 F (37 C) 98.7 F (37.1 C)  TempSrc: Oral Oral Oral Oral  SpO2: 91% 91% 98% 90%  Weight:      Height:        General: Pt is alert, awake, not in acute distress. Appears disheveled. Tangential thinking  Cardiovascular:  S1/S2 +, no rubs, no gallops Respiratory: course breath sounds b/l  Abdominal: Soft, NT, ND, bowel sounds + Extremities:  no cyanosis    The results of significant diagnostics from this hospitalization (including imaging, microbiology, ancillary and laboratory) are listed below for reference.     Microbiology: Recent Results (from the past 240 hour(s))  Surgical pcr screen     Status: None   Collection Time: 06/22/20  3:22  PM   Specimen: Nasal Mucosa; Nasal Swab  Result Value Ref Range Status   MRSA, PCR NEGATIVE NEGATIVE Final   Staphylococcus aureus NEGATIVE NEGATIVE Final    Comment: (NOTE) The Xpert SA Assay (FDA approved for NASAL specimens in patients 3 years of age and older), is one component of a comprehensive surveillance program. It is not intended to diagnose infection nor to guide or monitor treatment. Performed at [facility], [address] [place], [place] [address]   Expectorated sputum assessment w rflx to resp cult     Status: None   Collection Time: 06/28/20  1:10 AM   Specimen: Expectorated Sputum  Result Value Ref Range Status   Specimen Description EXPECTORATED SPUTUM  Final   Special Requests NONE  Final   Sputum evaluation   Final    THIS SPECIMEN IS ACCEPTABLE FOR SPUTUM CULTURE Performed at [facility], [address], [place], [place] [address]    Report Status 06/28/2020 FINAL  Final  Culture,  respiratory     Status: None   Collection Time: 06/28/20  1:10 AM  Result Value Ref Range Status   Specimen Description   Final    EXPECTORATED SPUTUM Performed at [facility], [address] [place], [place] [address]    Special Requests   Final    NONE Reflexed from [phone] Performed at [facility], [address] [place], [place] [address]    Gram Stain   Final    RARE WBC PRESENT, PREDOMINANTLY PMN NO ORGANISMS SEEN Performed at [facility], [place] [address] [place], [place] [address]    Culture RARE CANDIDA ALBICANS  Final   Report Status 06/30/2020 FINAL  Final     Labs: BNP (last 3 results) Recent Labs    06/18/20 2014  BNP 163.8*   Basic Metabolic Panel: Recent Labs  Lab 06/26/20 0445 06/27/20 0657 06/28/20 0327 06/29/20 0508 06/30/20 0421  NA 136 133* 137 133* 135  K 3.7 3.6 4.0 3.5 3.4*  CL 97* 94* 95* 93* 90*  CO2 32 32 34* 34* 36*  GLUCOSE 107* 128* 132* 171* 98  BUN 9 9 9  7* 5*  CREATININE 0.60* 0.70 0.66 0.73 0.66  CALCIUM 7.6* 7.9* 7.9* 7.9* 8.3*   Liver Function Tests: No results for input(s): AST, ALT, ALKPHOS, BILITOT, PROT, ALBUMIN in the last 168 hours. No results for input(s): LIPASE, AMYLASE in the last 168 hours. No results for input(s): AMMONIA in the last 168 hours. CBC: Recent Labs  Lab 06/26/20 0445 06/27/20 0657 06/28/20 0327 06/29/20 0508 06/30/20 0421  WBC 15.1* 14.5* 14.3* 16.0* 12.2*  HGB 11.4* 11.4* 11.1* 11.4* 11.7*  HCT 36.5* 35.3* 36.3* 37.1* 38.2*  MCV 93.6 90.5 95.0 93.0 92.9  PLT 530* 559* 608* 627* 701*   Cardiac Enzymes: No results for input(s): CKTOTAL, CKMB, CKMBINDEX, TROPONINI in the last 168 hours. BNP: Invalid input(s): POCBNP CBG: No results for input(s): GLUCAP in the last 168 hours. D-Dimer No results for input(s): DDIMER in the last 72 hours. Hgb A1c No results for input(s): HGBA1C in the last 72 hours. Lipid Profile No results for input(s): CHOL, HDL, LDLCALC,  TRIG, CHOLHDL, LDLDIRECT in the last 72 hours. Thyroid function studies No results for input(s): TSH, T4TOTAL, T3FREE, THYROIDAB in the last 72 hours.  Invalid input(s): FREET3 Anemia work up No results for input(s): VITAMINB12, FOLATE, FERRITIN, TIBC, IRON, RETICCTPCT in the last 72 hours. Urinalysis    Component Value Date/Time  COLORURINE YELLOW (A) 05/01/2016 0325   APPEARANCEUR CLEAR (A) 05/01/2016 0325   LABSPEC 1.018 05/01/2016 0325   PHURINE 5.0 05/01/2016 0325   GLUCOSEU NEGATIVE 05/01/2016 0325   HGBUR NEGATIVE 05/01/2016 0325   BILIRUBINUR NEGATIVE 05/01/2016 0325   KETONESUR NEGATIVE 05/01/2016 0325   PROTEINUR 30 (A) 05/01/2016 0325   NITRITE NEGATIVE 05/01/2016 0325   LEUKOCYTESUR NEGATIVE 05/01/2016 0325   Sepsis Labs Invalid input(s): PROCALCITONIN,  WBC,  LACTICIDVEN Microbiology Recent Results (from the past 240 hour(s))  Surgical pcr screen     Status: None   Collection Time: 06/22/20  3:22 PM   Specimen: Nasal Mucosa; Nasal Swab  Result Value Ref Range Status   MRSA, PCR NEGATIVE NEGATIVE Final   Staphylococcus aureus NEGATIVE NEGATIVE Final    Comment: (NOTE) The Xpert SA Assay (FDA approved for NASAL specimens in patients 48 years of age and older), is one component of a comprehensive surveillance program. It is not intended to diagnose infection nor to guide or monitor treatment. Performed at [facility], [address] [place], [place] [address]   Expectorated sputum assessment w rflx to resp cult     Status: None   Collection Time: 06/28/20  1:10 AM   Specimen: Expectorated Sputum  Result Value Ref Range Status   Specimen Description EXPECTORATED SPUTUM  Final   Special Requests NONE  Final   Sputum evaluation   Final    THIS SPECIMEN IS ACCEPTABLE FOR SPUTUM CULTURE Performed at [facility], [address] [place], [place] [address]    Report Status 06/28/2020 FINAL  Final  Culture, respiratory     Status: None    Collection Time: 06/28/20  1:10 AM  Result Value Ref Range Status   Specimen Description   Final    EXPECTORATED SPUTUM Performed at [facility], [address] [place], [place] [address]    Special Requests   Final    NONE Reflexed from [phone] Performed at [facility], [address] [place], [place] [address]    Gram Stain   Final    RARE WBC PRESENT, PREDOMINANTLY PMN NO ORGANISMS SEEN Performed at [facility], [place] [address] [place], [place] [address]    Culture RARE CANDIDA ALBICANS  Final   Report Status 06/30/2020 FINAL  Final     Time coordinating discharge: Over 30 minutes  SIGNED:   Wyvonnia Dusky, MD  [facility] 06/30/2020, 2:53 PM Pager   If 7PM-7AM, please contact night-coverage [email or web address]

## 2020-07-01 DIAGNOSIS — J9611 Chronic respiratory failure with hypoxia: Secondary | ICD-10-CM

## 2020-07-01 DIAGNOSIS — J189 Pneumonia, unspecified organism: Secondary | ICD-10-CM | POA: Diagnosis not present

## 2020-07-01 DIAGNOSIS — L03113 Cellulitis of right upper limb: Secondary | ICD-10-CM

## 2020-07-01 DIAGNOSIS — H35389 Toxic maculopathy, unspecified eye: Secondary | ICD-10-CM

## 2020-07-01 DIAGNOSIS — R21 Rash and other nonspecific skin eruption: Secondary | ICD-10-CM

## 2020-07-01 DIAGNOSIS — J449 Chronic obstructive pulmonary disease, unspecified: Secondary | ICD-10-CM | POA: Diagnosis not present

## 2020-07-01 DIAGNOSIS — J441 Chronic obstructive pulmonary disease with (acute) exacerbation: Secondary | ICD-10-CM

## 2020-07-01 LAB — BASIC METABOLIC PANEL
Anion gap: 8 (ref 5–15)
BUN: 5 mg/dL — ABNORMAL LOW (ref 8–23)
CO2: 32 mmol/L (ref 22–32)
Calcium: 8 mg/dL — ABNORMAL LOW (ref 8.9–10.3)
Chloride: 95 mmol/L — ABNORMAL LOW (ref 98–111)
Creatinine, Ser: 0.64 mg/dL (ref 0.61–1.24)
GFR calc Af Amer: >60 mL/min (ref >60)
GFR calc non Af Amer: >60 mL/min (ref >60)
Glucose, Bld: 146 mg/dL — ABNORMAL HIGH (ref 70–99)
Potassium: 3.5 mmol/L (ref 3.5–5.1)
Sodium: 135 mmol/L (ref 135–145)

## 2020-07-01 LAB — CBC
HCT: 32.6 % — ABNORMAL LOW (ref 39.0–52.0)
Hemoglobin: 10.4 g/dL — ABNORMAL LOW (ref 13.0–17.0)
MCH: 29.3 pg (ref 26.0–34.0)
MCHC: 31.9 g/dL (ref 30.0–36.0)
MCV: 91.8 fL (ref 80.0–100.0)
Platelets: 587 10*3/uL — ABNORMAL HIGH (ref 150–400)
RBC: 3.55 MIL/uL — ABNORMAL LOW (ref 4.22–5.81)
RDW: 13.7 % (ref 11.5–15.5)
WBC: 14 10*3/uL — ABNORMAL HIGH (ref 4.0–10.5)
nRBC: 0 % (ref 0.0–0.2)

## 2020-07-01 LAB — FUNGITELL, SERUM: Fungitell Result: <31 pg/mL (ref <80)

## 2020-07-01 MED ORDER — METRONIDAZOLE IN NACL 5-0.79 MG/ML-% IV SOLN
500.0000 mg | Freq: Three times a day (TID) | INTRAVENOUS | Status: DC
  Administered 2020-07-01 – 2020-07-02 (×3): 500 mg via INTRAVENOUS
  Filled 2020-07-01 (×7): qty 100

## 2020-07-01 MED ORDER — TRIAMCINOLONE ACETONIDE 0.5 % EX CREA
TOPICAL_CREAM | Freq: Two times a day (BID) | CUTANEOUS | Status: DC
  Filled 2020-07-01 (×2): qty 15

## 2020-07-01 MED ORDER — NYSTATIN 100000 UNIT/GM EX POWD: Freq: Three times a day (TID) | CUTANEOUS | 0 refills | Status: DC

## 2020-07-01 MED ORDER — SODIUM CHLORIDE 0.9 % IV SOLN
2.0000 g | INTRAVENOUS | Status: DC
  Administered 2020-07-01 – 2020-07-02 (×2): 2 g via INTRAVENOUS
  Filled 2020-07-01 (×2): qty 2

## 2020-07-01 MED ORDER — NYSTATIN 100000 UNIT/GM EX POWD
Freq: Three times a day (TID) | CUTANEOUS | Status: DC
  Filled 2020-07-01 (×2): qty 15

## 2020-07-01 NOTE — Progress Notes (Signed)
Patient ID: Mark [name], male   DOB: September 01, 1951, 69 y.o.   MRN: [id number] [facility] PROGRESS NOTE  Mark [name] AYT:[id number] DOB: [birth year] DOA: 06/18/2020 PCP: Sandra Cockayne, MD  HPI/Subjective: Patient not feeling well at all.  He is coughing and wheezing.  He developed a rash all over his body.  Also on his right arm where an IV was placed does have some redness and warmth.  Objective: Vitals:   07/01/20 0004 07/01/20 0732  BP: 128/66 (!) 102/59  Pulse: (!) 104 97  Resp: 18 17  Temp: 98 F (36.7 C) 97.8 F (36.6 C)  SpO2: 95% 95%    Intake/Output Summary (Last 24 hours) at 07/01/2020 1240 Last data filed at 07/01/2020 1033 Gross per 24 hour  Intake --  Output 1950 ml  Net -1950 ml   Filed Weights   06/18/20 2006  Weight: 81.6 kg    ROS: Review of Systems  Respiratory: Positive for cough and shortness of breath.   Cardiovascular: Negative for chest pain.  Gastrointestinal: Negative for abdominal pain, nausea and vomiting.   Exam: Physical Exam HENT:     Head: Normocephalic.     Mouth/Throat:     Pharynx: No oropharyngeal exudate.  Eyes:     General: Lids are normal.     Conjunctiva/sclera: Conjunctivae normal.     Pupils: Pupils are equal, round, and reactive to light.  Cardiovascular:     Rate and Rhythm: Normal rate and regular rhythm.     Heart sounds: Normal heart sounds, S1 normal and S2 normal.  Pulmonary:     Breath sounds: Examination of the right-middle field reveals wheezing. Examination of the left-middle field reveals wheezing. Examination of the right-lower field reveals decreased breath sounds and rhonchi. Examination of the left-lower field reveals decreased breath sounds and rhonchi. Decreased breath sounds, wheezing and rhonchi present. No rales.  Abdominal:     Palpations: Abdomen is soft.     Tenderness: There is no abdominal tenderness.  Musculoskeletal:     Right ankle: No swelling.     Left ankle: No swelling.   Skin:    General: Skin is warm.     Comments: Please see pictures.  Neurological:     Mental Status: He is alert and oriented to person, place, and time.             Data Reviewed: Basic Metabolic Panel: Recent Labs  Lab 06/27/20 0657 06/28/20 0327 06/29/20 0508 06/30/20 0421 07/01/20 0617  NA 133* 137 133* 135 135  K 3.6 4.0 3.5 3.4* 3.5  CL 94* 95* 93* 90* 95*  CO2 32 34* 34* 36* 32  GLUCOSE 128* 132* 171* 98 146*  BUN 9 9 7* 5* 5*  CREATININE 0.70 0.66 0.73 0.66 0.64  CALCIUM 7.9* 7.9* 7.9* 8.3* 8.0*   CBC: Recent Labs  Lab 06/27/20 0657 06/28/20 0327 06/29/20 0508 06/30/20 0421 07/01/20 0617  WBC 14.5* 14.3* 16.0* 12.2* 14.0*  HGB 11.4* 11.1* 11.4* 11.7* 10.4*  HCT 35.3* 36.3* 37.1* 38.2* 32.6*  MCV 90.5 95.0 93.0 92.9 91.8  PLT 559* 608* 627* 701* 587*   BNP (last 3 results) Recent Labs    06/18/20 2014  BNP 238.2*      Recent Results (from the past 240 hour(s))  Surgical pcr screen     Status: None   Collection Time: 06/22/20  3:22 PM   Specimen: Nasal Mucosa; Nasal Swab  Result Value Ref Range Status   MRSA, PCR NEGATIVE NEGATIVE Final  Staphylococcus aureus NEGATIVE NEGATIVE Final    Comment: (NOTE) The Xpert SA Assay (FDA approved for NASAL specimens in patients 41 years of age and older), is one component of a comprehensive surveillance program. It is not intended to diagnose infection nor to guide or monitor treatment. Performed at [facility], [address] [place], [place] [address]   Expectorated sputum assessment w rflx to resp cult     Status: None   Collection Time: 06/28/20  1:10 AM   Specimen: Expectorated Sputum  Result Value Ref Range Status   Specimen Description EXPECTORATED SPUTUM  Final   Special Requests NONE  Final   Sputum evaluation   Final    THIS SPECIMEN IS ACCEPTABLE FOR SPUTUM CULTURE Performed at [facility], [address], [place], [place] [address]    Report Status  06/28/2020 FINAL  Final  Culture, respiratory     Status: None   Collection Time: 06/28/20  1:10 AM  Result Value Ref Range Status   Specimen Description   Final    EXPECTORATED SPUTUM Performed at [facility], [address] [employer] [employer] [place], [place] [address]    Special Requests   Final    NONE Reflexed from [id number] Performed at [facility], [address] [place], [place] [address]    Gram Stain   Final    RARE WBC PRESENT, PREDOMINANTLY PMN NO ORGANISMS SEEN Performed at [facility], [place] [address] [place],  [address]    Culture RARE CANDIDA ALBICANS  Final   Report Status 06/30/2020 FINAL  Final     Studies: No results found.  Scheduled Meds: . aspirin EC  81 mg Oral Daily  . clonazePAM  1 mg Oral BID  . enoxaparin (LOVENOX) injection  40 mg Subcutaneous Q24H  . fluticasone furoate-vilanterol  1 puff Inhalation Daily  . guaiFENesin  1,200 mg Oral BID  . influenza vaccine adjuvanted  0.5 mL Intramuscular Tomorrow-1000  . ipratropium  2 puff Inhalation Q4H  . levothyroxine  25 mcg Oral QAC breakfast  . lidocaine  15 mL Oral Once  . nystatin   Topical TID  . pantoprazole  40 mg Oral Daily  . senna-docusate  1 tablet Oral BID  . sucralfate  1 g Oral TID WC & HS  . umeclidinium bromide  1 puff Inhalation Daily   Continuous Infusions: . cefTRIAXone (ROCEPHIN)  IV 2 g (07/01/20 1214)  . metronidazole      Assessment/Plan:  1. New maculopapular rash.  Case discussed with infectious disease doctor and switch antibiotics back to ceftriaxone and Flagyl.  Will give triamcinolone cream. 2. New erythema and warmth with prior IV site.  Infectious disease to evaluate today to see if get more aggressive with antibiotics or not. 3. Necrotizing pneumonia.  Augmentin switched over to Rocephin and Flagyl today. 4. 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  Continue inhalers and nebulizers.  With wheeze today. 5. Thrombocytosis  reactive in nature 6. Constipation on Colace 7. Anxiety continue Klonopin. 8. Onychomycosis.  Had bedside nail debridement 9. GERD on PPI     Code Status:     Code Status Orders  (From admission, onward)         Start     Ordered   06/22/20 1409  Do not attempt resuscitation (DNR)  Continuous       Question Answer Comment  In the event of cardiac or respiratory ARREST Do not call a "code  blue"   In the event of cardiac or respiratory ARREST Do not perform Intubation, CPR, defibrillation or ACLS   In the event of cardiac or respiratory ARREST Use medication by any route, position, wound care, and other measures to relive pain and suffering. May use oxygen, suction and manual treatment of airway obstruction as needed for comfort.   Comments MOST form on chart.      06/22/20 1409        Code Status History    Date Active Date Inactive Code Status Order ID Comments User Context   06/18/2020 2325 06/22/2020 1409 Full Code [id number]  Lenore Cordia, MD ED   01/18/2019 0010 01/18/2019 2118 Full Code [id number]  Lance Coon, MD Inpatient   05/01/2016 0934 05/03/2016 1455 Full Code [id number]  Harrie Foreman, MD Inpatient   Advance Care Planning Activity     Disposition Plan: Status is: Inpatient  Dispo: The patient is from: Home              Anticipated d/c is to: Rehab              Anticipated d/c date is: Potentially 07/02/2020.              Patient currently with new rash.  Since am seeing the patient for the first time today we will get the infectious disease doctor to look at things.  Consultants:  Infectious disease  Pulmonary  Antibiotics:  Antibiotics switched back to IV Rocephin and IV Flagyl  Time spent: 30 minutes  [place]

## 2020-07-01 NOTE — Progress Notes (Signed)
Pulmonary Medicine          Date: 07/01/2020,   MRN# [id number] Mark [name] 1951-03-03     AdmissionWeight: 81.6 kg                 CurrentWeight: 81.6 kg   Referring physician: Dr. Manuella Ghazi   CHIEF COMPLAINT:   Necrotizing pneumonia   HISTORY OF PRESENT ILLNESS   Is a pleasant 69 year old male with a history of chronic pain syndrome with opioid-induced constipation, GERD, advanced COPD chronic hypoxemia on 4 L/min supplemental oxygen home O2.  Also has a background history of hypothyroidism, peripheral neuropathy chronic lumbago, reports worsening respiratory status x2 to 3 months with signs and symptoms of acute COPD exacerbation with increased volume and darkening of phlegm on expectoration.  Most recently in the last 2 weeks prior to admission he developed chills diaphoresis and subjective fevers.  In the ER he was found to have leukocytosis, negative COVID-19 test x2, increased O2 requirement from 4 to 5 L/min nasal cannula however venous blood gas was essentially normal BNP and troponin also within reference range.  CT chest shows necrotizing pneumonia with consolidative infiltrate of the left upper and lower lung zones as well as fluid level cystic lesion suggestive of abscess with sorrounding emphysema.  Pulmonary consultation placed for further evaluation and management of necrotizing pneumonia.  He has crusted swollen erythematous feet with intertrigo, he has distended abdomen, poor dentition, and reports broken back. He shares that he has no family and when asked regarding medical proxy he states "im on my own".  Overall he looks chronically ill.    06/20/20- patient had a good night sleep and now is more lucid, hes drinking coffee.  He is speaking rapidly with cicumferential and tangential language. WBC count is with mild trend up.   06/22/20- patient is DNR/DNI after speaking to palliative today - appreciate collaboration. Sputum with GPCs. Patient requests MDI  inhaler we discussed use of Spiriva and Symbicort.  Mild hypokalemia this am, pharmacy consult for repletion. Repeat CXR in am.    06/23/20- patient appears clincally improved this morning.  CXR this am reviewed by me - left lung cavitary pneumonia and superimposed combined pulmonary fibrosis and emphysema. Patient is on 4L/min [place]  06/24/20-patient is similar as yesterday he complains of post prandial substernal chest discomfort, we discussed potential GERD related pain. He has extensive emphysema and will unlikely be a good candidate for bronchoscopy. We will order spirometry to evaluate candidacy for anesthesia.   06/25/20- patient is slightly imrproved this am.  Leukocytosis finally trending down, and he is coughing up more phlegm thickened brown material possibly aspirated food stuffs. He has GERD will order lidocaine GI coctail and carafate.   06/26/20- patient is on 3L/min, he complains of thrush but throat, lingual surface and upper airway are clear. He had barium study yesterday due to complaints of severe dysphagia but that was also unremarkable. He is speaking in full sentences without desaturation and breathing on auscultation is inmproved  Bilateraly.   06/27/20- patient is more conversive, hes saturating >95% on 4L can wean down as able. He is coughing up more brown debris, I will recollect resp culture.  Left and right sided auscultation is imrpoved.   06/29/20- patient is improved, he is on home setting of 3-4L/min.  He is smiling.  He has 4 bananas for lunch, have asked him to pace himself so he does not get constipated or with elelctrolyte derrangement.   06/29/20-  patient is improved he is being discharged today. We discussed post hospitalization follow up. He had COVID19 vaccine in left shoulder today, he denies pain but states "strong onset of fatigue"  06/30/20- patient has developed rash. He is breathing well, there is ronchorous breaths sounds but no wheezing patient states he had a  breathing treatment prior to my arrival. He complains of having a dirty sponge bath with feces all over his body.     PAST MEDICAL HISTORY   Past Medical History:  Diagnosis Date   Acute respiratory failure with hypoxia and hypercapnia (HCC) 05/01/2016   Asthma    COPD (chronic obstructive pulmonary disease) (HCC)    Osteoporosis      SURGICAL HISTORY   Past Surgical History:  Procedure Laterality Date   BACK SURGERY       FAMILY HISTORY   Family History  Problem Relation Age of Onset   Pulmonary fibrosis Mother      SOCIAL HISTORY   Social History   Tobacco Use   Smoking status: Former Smoker   Smokeless tobacco: Never Used  Substance Use Topics   Alcohol use: No   Drug use: No     MEDICATIONS    Home Medication:    Current Medication:  Current Facility-Administered Medications:    albuterol (PROVENTIL) (2.5 MG/3ML) 0.083% nebulizer solution 3 mL, 3 mL, Inhalation, Q4H PRN, Zada Finders R, MD, 3 mL at 06/25/20 0152   alum & mag hydroxide-simeth (MAALOX/MYLANTA) 200-200-20 MG/5ML suspension 30 mL, 30 mL, Oral, Q6H PRN **AND** lidocaine (XYLOCAINE) 2 % viscous mouth solution 15 mL, 15 mL, Oral, Once, Neely Kammerer, MD   aspirin EC tablet 81 mg, 81 mg, Oral, Daily, Amery, Sahar, MD, 81 mg at 07/01/20 1025   cefTRIAXone (ROCEPHIN) 2 g in sodium chloride 0.9 % 100 mL IVPB, 2 g, Intravenous, Q24H, Wieting, Richard, MD, Last Rate: 200 mL/hr at 07/01/20 1214, 2 g at 07/01/20 1214   chlorpheniramine-HYDROcodone (TUSSIONEX) 10-8 MG/5ML suspension 5 mL, 5 mL, Oral, Q4H PRN, Max Sane, MD, 5 mL at 07/01/20 1833   clonazePAM (KLONOPIN) tablet 1 mg, 1 mg, Oral, BID, Zada Finders R, MD, 1 mg at 06/30/20 2045   enoxaparin (LOVENOX) injection 40 mg, 40 mg, Subcutaneous, Q24H, Patel, Vishal R, MD, 40 mg at 06/29/20 0958   fluticasone (FLONASE) 50 MCG/ACT nasal spray 2 spray, 2 spray, Each Nare, Daily PRN, Kurtis Bushman, Sahar, MD   fluticasone  furoate-vilanterol (BREO ELLIPTA) 200-25 MCG/INH 1 puff, 1 puff, Inhalation, Daily, Amery, Sahar, MD, 1 puff at 07/01/20 1027   guaiFENesin (MUCINEX) 12 hr tablet 1,200 mg, 1,200 mg, Oral, BID, Posey Pronto, Vishal R, MD, 1,200 mg at 07/01/20 1026   influenza vaccine adjuvanted (FLUAD) injection 0.5 mL, 0.5 mL, Intramuscular, Tomorrow-1000, Ravishankar, Jayashree, MD   ipratropium (ATROVENT HFA) inhaler 2 puff, 2 puff, Inhalation, Q4H, Wyvonnia Dusky, MD, 2 puff at 07/01/20 1707   levothyroxine (SYNTHROID) tablet 25 mcg, 25 mcg, Oral, QAC breakfast, Zada Finders R, MD, 25 mcg at 07/01/20 0505   metroNIDAZOLE (FLAGYL) IVPB 500 mg, 500 mg, Intravenous, Q8H, Wieting, Richard, MD, Last Rate: 100 mL/hr at 07/01/20 1302, 500 mg at 07/01/20 1302   nystatin (MYCOSTATIN/NYSTOP) topical powder, , Topical, TID, Loletha Grayer, MD, Given at 07/01/20 1707   pantoprazole (PROTONIX) EC tablet 40 mg, 40 mg, Oral, Daily, Eppie Gibson M, MD, 40 mg at 07/01/20 1025   polyethylene glycol (MIRALAX / GLYCOLAX) packet 17 g, 17 g, Oral, Daily PRN, Nolberto Hanlon, MD, 17 g at  06/22/20 1353   senna-docusate (Senokot-S) tablet 1 tablet, 1 tablet, Oral, BID, Nolberto Hanlon, MD, 1 tablet at 06/30/20 2046   sucralfate (CARAFATE) tablet 1 g, 1 g, Oral, TID WC & HS, Shaneque Merkle, MD, 1 g at 07/01/20 1707   triamcinolone cream (KENALOG) 0.5 %, , Topical, BID, Loletha Grayer, MD, Given at 07/01/20 1442   [DISCONTINUED] fluticasone furoate-vilanterol (BREO ELLIPTA) 100-25 MCG/INH 1 puff, 1 puff, Inhalation, Daily, 1 puff at 06/22/20 0856 **AND** umeclidinium bromide (INCRUSE ELLIPTA) 62.5 MCG/INH 1 puff, 1 puff, Inhalation, Daily, Lu Duffel, RPH, 1 puff at 07/01/20 1027    ALLERGIES   Patient has no known allergies.     REVIEW OF SYSTEMS    Review of Systems:  Gen:  Denies  fever, sweats, chills weigh loss  HEENT: Denies blurred vision, double vision, ear pain, eye pain, hearing loss, nose  bleeds, sore throat Cardiac:  No dizziness, chest pain or heaviness, chest tightness,edema Resp:   Reports SOB Gi: Denies swallowing difficulty, stomach pain, nausea or vomiting, diarrhea, constipation, bowel incontinence Gu:  Denies bladder incontinence, burning urine Ext:   Denies Joint pain, stiffness or swelling Skin: Denies  skin rash, easy bruising or bleeding or hives Endoc:  Denies polyuria, polydipsia , polyphagia or weight change Psych:   Denies depression, insomnia or hallucinations   Other:  All other systems negative   VS: BP 133/66 (BP Location: Right Arm)    Pulse (!) 101    Temp 98.2 F (36.8 C) (Oral)    Resp 16    Ht 6' (1.829 m)    Wt 81.6 kg    SpO2 95%    BMI 24.41 kg/m      PHYSICAL EXAM    GENERAL:NAD, no fevers, chills, no weakness no fatigue HEAD: Normocephalic, atraumatic.  EYES: Pupils equal, round, reactive to light. Extraocular muscles intact. No scleral icterus.  MOUTH: Moist mucosal membrane. Dentition intact. No abscess noted.  EAR, NOSE, THROAT: Clear without exudates. No external lesions.  NECK: Supple. No thyromegaly. No nodules. No JVD.  PULMONARY:rhonchi bilaterally  CARDIOVASCULAR: S1 and S2. Regular rate and rhythm. No murmurs, rubs, or gallops. No edema. Pedal pulses 2+ bilaterally.  GASTROINTESTINAL: Soft, nontender, nondistended. No masses. Positive bowel sounds. No hepatosplenomegaly.  MUSCULOSKELETAL: No swelling, clubbing, or edema. Range of motion full in all extremities.  NEUROLOGIC: Cranial nerves II through XII are intact. No gross focal neurological deficits. Sensation intact. Reflexes intact.  SKIN: crusted feet with erythemaouts intertrigo. PSYCHIATRIC: Mood, affect within normal limits. The patient is awake, alert and oriented x 3. Insight, judgment intact.       IMAGING    CT Chest W Contrast  Result Date: 06/18/2020 CLINICAL DATA:  69 year old male with shortness of breath, productive cough, pneumonia. On home oxygen.  EXAM: CT CHEST WITH CONTRAST TECHNIQUE: Multidetector CT imaging of the chest was performed during intravenous contrast administration. CONTRAST:  40mL OMNIPAQUE IOHEXOL 300 MG/ML  SOLN COMPARISON:  Portable chest earlier today.  Chest CT 01/17/2020. FINDINGS: Cardiovascular: No cardiomegaly or pericardial effusion. Calcified coronary artery and aortic atherosclerosis. The central vascular structures of the mediastinum are enhancing and appear to be patent. Mediastinum/Nodes: Mildly increased in reactive appearing mediastinal lymph nodes compared to 2020, up to 14 mm short axis. Some nodes (right paratracheal on series 2, image 47) have not significantly changed. Additionally, there is evidence of a larger 15 mm short axis left AP window node. Lungs/Pleura: Chronic pulmonary hyperinflation. Extensive chronic emphysema. Previous bullous emphysema in the  left upper lung where now there is extensive consolidation with numerous small rounded areas of opacity suspicious for cavitation. There is a prominent fluid level lateral to the left hilum on series 3, image 65 suspicious for lung abscess. Abnormal interstitial thickening and peribronchial nodularity throughout the left lung. No superimposed pneumothorax. Only trace left pleural fluid. The major airways remain patent. Right lung emphysema and markings appear stable since last year. Upper Abdomen: Negative visible liver, gallbladder, spleen, pancreas, adrenal glands, kidneys, and bowel in the upper abdomen. Musculoskeletal: Chronic compression fractures and ankylosis in the spine, with several levels of previous vertebral body augmentation, appears stable since last year. Occasional chronic rib fractures. No acute osseous abnormality identified. No chest wall abnormality. IMPRESSION: 1. 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 Consider necrotizing pneumonia as multiple areas are suspicious for cavitation, and especially a fluid-level  lateral to the left hilum (series 2, image 68) strongly suggesting Lung Abscess. 2. Right lung unaffected.  Only trace left pleural fluid. 3. Reactive appearing mediastinal lymph nodes. 4. Calcified coronary artery and Aortic Atherosclerosis (ICD10-I70.0). Emphysema (ICD10-J43.9). Electronically Signed   By: Genevie Ann M.D.   On: 06/18/2020 22:14   DG Chest Port 1 View  Result Date: 06/26/2020 CLINICAL DATA:  Shortness of breath. EXAM: PORTABLE CHEST 1 VIEW COMPARISON:  June 23, 2020. FINDINGS: The heart size and mediastinal contours are within normal limits. No pneumothorax or pleural effusion is noted. Stable diffuse left lung opacities are noted consistent with pneumonia. Stable right basilar opacity is noted concerning for worsening atelectasis or pneumonia. The visualized skeletal structures are unremarkable. IMPRESSION: Stable bilateral lung opacities are noted consistent with multifocal pneumonia. Electronically Signed   By: Marijo Conception M.D.   On: 06/26/2020 08:57   DG Chest Port 1 View  Result Date: 06/23/2020 CLINICAL DATA:  Pneumonia EXAM: PORTABLE CHEST 1 VIEW COMPARISON:  June 18, 2020 chest radiograph and chest CT FINDINGS: Extensive airspace opacity is noted throughout the left upper and left mid lung regions with areas of apparent cavitation. Less discrete infiltrate is noted throughout the remainder of the left lung, stable. There is underlying emphysematous change. Scattered areas of scarring noted on the right without edema or airspace opacity. Heart size and pulmonary vascularity are normal. No adenopathy. No bone lesions. IMPRESSION: Underlying emphysema. Widespread airspace opacity on the left with areas of cavitary pneumonia throughout the left upper and mid lung regions, similar to recent prior studies. Underlying fibrosis elsewhere, most severe left lower lung region. Scattered areas of scarring on the right without edema or airspace opacity. Stable cardiac silhouette.  Electronically Signed   By: Lowella Grip III M.D.   On: 06/23/2020 08:37   DG Chest Portable 1 View  Result Date: 06/18/2020 CLINICAL DATA:  Shortness of breath all summer lung, worsening last week, productive cough, congestion EXAM: PORTABLE CHEST 1 VIEW COMPARISON:  CT 01/17/2019, radiograph 01/17/2019 FINDINGS: There is a background chronic reticular and fibrotic interstitial changes throughout both lungs albeit with new area of superimposed consolidative opacity spanning the left mid lung to apex including an area of potential cavitation seen more peripherally. No discernible pneumothorax or visible effusion. Cardiomediastinal contours are partially obscured by overlying opacity with the visible borders similar in appearance to the comparison studies from prior. No acute osseous or soft tissue abnormality. Degenerative changes are present in the imaged spine and shoulders. Telemetry leads overlie the chest. IMPRESSION: Consolidative opacity in the left mid lung to apex with some questionable cavitation  peripherally. Recommend further characterization with CT of the chest with contrast if patient is able to tolerate. Findings superimposed on chronic reticular and fibrotic interstitial changes. These results were called by telephone at the time of interpretation on 06/18/2020 at 8:45 pm to provider [facility] , who verbally acknowledged these results. Electronically Signed   By: Lovena Le M.D.   On: 06/18/2020 20:45   DG Swallowing Func-Speech Pathology  Result Date: 06/25/2020 Objective Swallowing Evaluation: Type of Study: MBS-Modified Barium Swallow Study  Patient Details Name: Nathanyel Defenbaugh MRN: [id number] Date of Birth: August 20, 1951 Today's Date: 06/25/2020 Time: SLP Start Time (ACUTE ONLY): 4098 -SLP Stop Time (ACUTE ONLY): 0905 SLP Time Calculation (min) (ACUTE ONLY): 30 min Past Medical History: Past Medical History: Diagnosis Date  Acute respiratory failure with hypoxia and hypercapnia  (HCC) 05/01/2016  Asthma   COPD (chronic obstructive pulmonary disease) ([place])   Osteoporosis  Past Surgical History: Past Surgical History: Procedure Laterality Date  BACK SURGERY   HPI: Mr Dibert is a pleasant 69 year old male with a history of chronic pain syndrome with opioid-induced constipation, GERD, advanced COPD chronic hypoxemia on 4 L/min supplemental oxygen home O2.  Also has a background history of hypothyroidism, peripheral neuropathy chronic lumbago, reports worsening respiratory status x2 to 3 months with signs and symptoms of acute COPD exacerbation with increased volume and darkening of phlegm on expectoration.  CT chest shows necrotizing pneumonia with consolidative infiltrate of the left upper and lower lung zones as well as fluid level cystic lesion suggestive of abscess with sorrounding emphysema.   Subjective: pt pleasant, conversant Assessment / Plan / Recommendation CHL IP CLINICAL IMPRESSIONS 06/25/2020 Clinical Impression Pt demonstrated grossly functional oropharyngeal abilities when consuming puree, solids, whole barium tablet, thin liquids via cup and nectar thick liquids via cup. On imaging pt presents with moderate oral phase deficits that are c/b decrased bolus management. However after talking with pt, it appears this is an incidental finding as pt reports this to be "the way I have always chewed." When consuming the above PO intake, penetration and aspiraiton were not observed. Pt reports that he doesn't have issues with swallow but after he swallows "it feels like Kerosene is going down my esophagus." He reports raw sensation. At this time, pt appears appropriate to continue on current diet. Extensive education provided on oral care (regardless of only having 2 teeth). Skilled ST intervention is not indicated at this time.  SLP Visit Diagnosis Dysphagia, unspecified (R13.10) Attention and concentration deficit following -- Frontal lobe and executive function deficit following  -- Impact on safety and function Mild aspiration risk   CHL IP TREATMENT RECOMMENDATION 06/25/2020 Treatment Recommendations No treatment recommended at this time   No flowsheet data found. CHL IP DIET RECOMMENDATION 06/25/2020 SLP Diet Recommendations Regular solids;Thin liquid Liquid Administration via Cup Medication Administration Whole meds with liquid Compensations Minimize environmental distractions;Slow rate;Small sips/bites Postural Changes Seated upright at 90 degrees   CHL IP OTHER RECOMMENDATIONS 06/25/2020 Recommended Consults -- Oral Care Recommendations Oral care BID Other Recommendations --   CHL IP FOLLOW UP RECOMMENDATIONS 06/25/2020 Follow up Recommendations None   No flowsheet data found.     CHL IP ORAL PHASE 06/25/2020 Oral Phase WFL Oral - Pudding Teaspoon -- Oral - Pudding Cup -- Oral - Honey Teaspoon -- Oral - Honey Cup -- Oral - Nectar Teaspoon -- Oral - Nectar Cup -- Oral - Nectar Straw -- Oral - Thin Teaspoon -- Oral - Thin Cup -- Oral - Thin Straw --  Oral - Puree -- Oral - Mech Soft -- Oral - Regular -- Oral - Multi-Consistency -- Oral - Pill -- Oral Phase - Comment --  CHL IP PHARYNGEAL PHASE 06/25/2020 Pharyngeal Phase WFL Pharyngeal- Pudding Teaspoon -- Pharyngeal -- Pharyngeal- Pudding Cup -- Pharyngeal -- Pharyngeal- Honey Teaspoon -- Pharyngeal -- Pharyngeal- Honey Cup -- Pharyngeal -- Pharyngeal- Nectar Teaspoon -- Pharyngeal -- Pharyngeal- Nectar Cup -- Pharyngeal -- Pharyngeal- Nectar Straw -- Pharyngeal -- Pharyngeal- Thin Teaspoon -- Pharyngeal -- Pharyngeal- Thin Cup -- Pharyngeal -- Pharyngeal- Thin Straw -- Pharyngeal -- Pharyngeal- Puree -- Pharyngeal -- Pharyngeal- Mechanical Soft -- Pharyngeal -- Pharyngeal- Regular -- Pharyngeal -- Pharyngeal- Multi-consistency -- Pharyngeal -- Pharyngeal- Pill -- Pharyngeal -- Pharyngeal Comment --  CHL IP CERVICAL ESOPHAGEAL PHASE 06/25/2020 Cervical Esophageal Phase WFL Pudding Teaspoon -- Pudding Cup -- Honey Teaspoon -- Honey Cup -- Nectar  Teaspoon -- Nectar Cup -- Nectar Straw -- Thin Teaspoon -- Thin Cup -- Thin Straw -- Puree -- Mechanical Soft -- Regular -- Multi-consistency -- Pill -- Cervical Esophageal Comment -- Happi Overton 06/25/2020, 1:00 PM                 ASSESSMENT/PLAN   Left lung necrortizing pneumonia with abscess -patient with poor dentition possible anaerobic etiology -will send off sputum cultures patient states he can cough phlegm -he is altered with encephalopathy -sputum culture x 3->>>> +GPCs  -AFB sputum-negatve -histo/strep pneumo/legionella urine ag -blood cultures-neg to date -MRSA nasal PCR -procalcitonin trend -defer antimicrobial regimen to ID - currently on Unasyn>>rocephin +flagyl +vancomycin>>>Unasyn  -poor prognosis - palliative evaluation  -follow up in clinic 2wks - [facility]   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)  - chronic hypoxemia with increased O2 requirement  -his emphysema is beyond repair and patient has very poor prognosis -typical COPD carepath with duoneb, bronchopulmonary hygiene, steroids and antibiotics is appropriate for inpatient therapy   Altered mental status with confusion -improved  - possible septic encephalitis, patient admits to drinking alcohol  - VBG does not support hypercarbic encephalopathy  -patient does not lack capacity and has had psychiatric evaluation    Lower extermity foot wounds  - wound care  And podiatry evaluation - possible  Cellulitis/intertrigo    GERD with aspiration  lidocaine malox GI cocktail q6hprn -carafate and protonix -sp Barium study with speech and swallow -  No worrisome findings   Thank you for allowing me to participate in the care of this patient.   Patient/Family are satisfied with care plan and all questions have been answered.  This document was prepared using Dragon voice recognition software and may include unintentional dictation errors.     Ottie Glazier, M.D.    Division of [place]

## 2020-07-02 DIAGNOSIS — J189 Pneumonia, unspecified organism: Secondary | ICD-10-CM | POA: Diagnosis not present

## 2020-07-02 DIAGNOSIS — H35389 Toxic maculopathy, unspecified eye: Secondary | ICD-10-CM | POA: Diagnosis not present

## 2020-07-02 DIAGNOSIS — J449 Chronic obstructive pulmonary disease, unspecified: Secondary | ICD-10-CM | POA: Diagnosis not present

## 2020-07-02 DIAGNOSIS — E039 Hypothyroidism, unspecified: Secondary | ICD-10-CM

## 2020-07-02 MED ORDER — METRONIDAZOLE 500 MG PO TABS
500.0000 mg | ORAL_TABLET | Freq: Three times a day (TID) | ORAL | Status: DC
  Administered 2020-07-02 – 2020-07-06 (×11): 500 mg via ORAL
  Filled 2020-07-02 (×12): qty 1

## 2020-07-02 MED ORDER — SODIUM CHLORIDE 0.9 % IV SOLN
INTRAVENOUS | Status: DC | PRN
  Administered 2020-07-02: 1000 mL via INTRAVENOUS

## 2020-07-02 MED ORDER — DEXAMETHASONE SODIUM PHOSPHATE 4 MG/ML IJ SOLN
4.0000 mg | INTRAMUSCULAR | Status: DC
  Administered 2020-07-02: 4 mg via INTRAVENOUS
  Filled 2020-07-02 (×3): qty 1

## 2020-07-02 MED ORDER — DOXYCYCLINE HYCLATE 100 MG PO TABS
100.0000 mg | ORAL_TABLET | Freq: Two times a day (BID) | ORAL | Status: DC
  Administered 2020-07-03 – 2020-07-06 (×7): 100 mg via ORAL
  Filled 2020-07-02 (×7): qty 1

## 2020-07-02 MED ORDER — HYDROCERIN EX CREA
TOPICAL_CREAM | Freq: Two times a day (BID) | CUTANEOUS | Status: DC
  Filled 2020-07-02: qty 113

## 2020-07-02 NOTE — Progress Notes (Signed)
PT Cancellation Note  Patient Details Name: Mark [name] MRN: [id number] DOB: [birth year]   Cancelled Treatment:     PT attempt. 3rd attempt today. Pt unwilling to participate at this time and frustrated that therapist documented refusals from earlier in day. Therapist explained to pt that he has been unwilling to cooperate since admission. Multiple attempts daily to get pt to participate however he is resistive/unwilling. Currently unwilling to participate 2/2 to needing to eat lunch. Explained that its 2 pm and lunch usually is done by this time. He states he will participate in session at 3pm today. Will return at 3pm to encourage OOB activity.     [name] 07/02/2020, 2:30 PM

## 2020-07-02 NOTE — Progress Notes (Signed)
Patient ID: Mark [name], male   DOB: 02-17-1951, 69 y.o.   MRN: [id number] [facility] PROGRESS NOTE  Mark [name] DGU:[id number] DOB: [birth year] DOA: 06/18/2020 PCP: Sandra Cockayne, MD  HPI/Subjective: Patient with diffuse rash chest and abdomen.  Also had an area on his right forearm where an IV site was.  Patient has some itching.  Has some shortness of breath.  Not feeling great.  Objective: Vitals:   07/02/20 0004 07/02/20 0757  BP: (!) 114/56 101/65  Pulse: 98 87  Resp: 17 17  Temp: 98 F (36.7 C) 97.7 F (36.5 C)  SpO2: 96% 99%    Intake/Output Summary (Last 24 hours) at 07/02/2020 1310 Last data filed at 07/01/2020 1834 Gross per 24 hour  Intake --  Output 700 ml  Net -700 ml   Filed Weights   06/18/20 2006  Weight: 81.6 kg    ROS: Review of Systems  Constitutional: Positive for malaise/fatigue.  Respiratory: Positive for cough and shortness of breath.   Cardiovascular: Negative for chest pain.  Gastrointestinal: Negative for abdominal pain, nausea and vomiting.   Exam: Physical Exam HENT:     Head: Normocephalic.  Eyes:     General: Lids are normal.     Conjunctiva/sclera: Conjunctivae normal.  Cardiovascular:     Rate and Rhythm: Normal rate and regular rhythm.     Heart sounds: Normal heart sounds, S1 normal and S2 normal.  Pulmonary:     Breath sounds: Examination of the right-lower field reveals decreased breath sounds. Examination of the left-lower field reveals decreased breath sounds. Decreased breath sounds present. No wheezing, rhonchi or rales.  Abdominal:     Palpations: Abdomen is soft.     Tenderness: There is no abdominal tenderness.  Musculoskeletal:     Right lower leg: No swelling.     Left lower leg: No swelling.  Skin:    General: Skin is warm.     Comments: Right forearm redness seems a little bit less than yesterday. Diffuse rash on abdomen, chest and back still present.  Neurological:     Mental Status: He is  alert and oriented to person, place, and time.       Data Reviewed: Basic Metabolic Panel: Recent Labs  Lab 06/27/20 0657 06/28/20 0327 06/29/20 0508 06/30/20 0421 07/01/20 0617  NA 133* 137 133* 135 135  K 3.6 4.0 3.5 3.4* 3.5  CL 94* 95* 93* 90* 95*  CO2 32 34* 34* 36* 32  GLUCOSE 128* 132* 171* 98 146*  BUN 9 9 7* 5* 5*  CREATININE 0.70 0.66 0.73 0.66 0.64  CALCIUM 7.9* 7.9* 7.9* 8.3* 8.0*   CBC: Recent Labs  Lab 06/27/20 0657 06/28/20 0327 06/29/20 0508 06/30/20 0421 07/01/20 0617  WBC 14.5* 14.3* 16.0* 12.2* 14.0*  HGB 11.4* 11.1* 11.4* 11.7* 10.4*  HCT 35.3* 36.3* 37.1* 38.2* 32.6*  MCV 90.5 95.0 93.0 92.9 91.8  PLT 559* 608* 627* 701* 587*   BNP (last 3 results) Recent Labs    06/18/20 2014  BNP 238.2*    Pr  Recent Results (from the past 240 hour(s))  Surgical pcr screen     Status: None   Collection Time: 06/22/20  3:22 PM   Specimen: Nasal Mucosa; Nasal Swab  Result Value Ref Range Status   MRSA, PCR NEGATIVE NEGATIVE Final   Staphylococcus aureus NEGATIVE NEGATIVE Final    Comment: (NOTE) The Xpert SA Assay (FDA approved for NASAL specimens in patients 71 years of age and older), is  one component of a comprehensive surveillance program. It is not intended to diagnose infection nor to guide or monitor treatment. Performed at [facility], [address] [place], [place] [address]   Expectorated sputum assessment w rflx to resp cult     Status: None   Collection Time: 06/28/20  1:10 AM   Specimen: Expectorated Sputum  Result Value Ref Range Status   Specimen Description EXPECTORATED SPUTUM  Final   Special Requests NONE  Final   Sputum evaluation   Final    THIS SPECIMEN IS ACCEPTABLE FOR SPUTUM CULTURE Performed at [facility], [address] [place], [place] [address]    Report Status 06/28/2020 FINAL  Final  Culture, respiratory     Status: None   Collection Time: 06/28/20  1:10 AM  Result Value Ref Range  Status   Specimen Description   Final    EXPECTORATED SPUTUM Performed at [facility], [address], [place], [place] [address]    Special Requests   Final    NONE Reflexed from [id number] Performed at [facility], [address] [place], [place] [address]    Gram Stain   Final    RARE WBC PRESENT, PREDOMINANTLY PMN NO ORGANISMS SEEN Performed at [facility], [place] [address], [place],  [address]    Culture RARE CANDIDA ALBICANS  Final   Report Status 06/30/2020 FINAL  Final     Studies: No results found.  Scheduled Meds:  aspirin EC  81 mg Oral Daily   clonazePAM  1 mg Oral BID   enoxaparin (LOVENOX) injection  40 mg Subcutaneous Q24H   fluticasone furoate-vilanterol  1 puff Inhalation Daily   guaiFENesin  1,200 mg Oral BID   hydrocerin   Topical BID   influenza vaccine adjuvanted  0.5 mL Intramuscular Tomorrow-1000   ipratropium  2 puff Inhalation Q4H   levothyroxine  25 mcg Oral QAC breakfast   lidocaine  15 mL Oral Once   nystatin   Topical TID   pantoprazole  40 mg Oral Daily   senna-docusate  1 tablet Oral BID   sucralfate  1 g Oral TID WC & HS   triamcinolone cream   Topical BID   umeclidinium bromide  1 puff Inhalation Daily   Continuous Infusions:  sodium chloride 1,000 mL (07/02/20 1216)   cefTRIAXone (ROCEPHIN)  IV 2 g (07/02/20 1219)   metronidazole 500 mg (07/02/20 0341)    Assessment/Plan:  1. Maculopapular rash likely secondary to beta lactams.  Patient currently on IV ceftriaxone and Flagyl.  Triamcinolone cream.  Patient also wanted Eucerin cream for his lower extremities. 2. Erythema warmth up prior IV site on right forearm.  This seems a little bit better than yesterday. 3. Necrotizing pneumonia.  Now on Rocephin and Flagyl as per infectious disease. 4. 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  Continue inhalers and nebulizer treatments.  I did not hear a wheeze  today. 5. Thrombocytosis reactive in nature secondary to infection. 6. Anxiety on Klonopin 7. Fungal infection groin on nystatin powder 8. Hypothyroidism unspecified on levothyroxine     Code Status:     Code Status Orders  (From admission, onward)         Start     Ordered   06/22/20 1409  Do not attempt resuscitation (DNR)  Continuous       Question Answer Comment  In the event of cardiac or respiratory ARREST Do not call a code  blue   In the event of cardiac or respiratory ARREST Do not perform Intubation, CPR, defibrillation or ACLS   In the event of cardiac or respiratory ARREST Use medication by any route, position, wound care, and other measures to relive pain and suffering. May use oxygen, suction and manual treatment of airway obstruction as needed for comfort.   Comments MOST form on chart.      06/22/20 1409        Code Status History    Date Active Date Inactive Code Status Order ID Comments User Context   06/18/2020 2325 06/22/2020 1409 Full Code [id number]  Lenore Cordia, MD ED   01/18/2019 0010 01/18/2019 2118 Full Code [id number]  Lance Coon, MD Inpatient   05/01/2016 0934 05/03/2016 1455 Full Code [id number]  Harrie Foreman, MD Inpatient   Advance Care Planning Activity     Family Communication: Deferred me calling family Disposition Plan: Status is: Inpatient  Dispo: The patient is from: Home              Anticipated d/c is to: Rehab              Anticipated d/c date is: Patient now on IV antibiotics.  We will have to figure out a plan on antibiotics prior to disposition              Patient currently on IV antibiotics secondary to rash with beta-lactam's.  Consultants:  Infectious disease  Pulmonary  Antibiotics:  IV Rocephin  Flagyl  Time spent: 28 minutes  [place]

## 2020-07-02 NOTE — Progress Notes (Signed)
Pulmonary Medicine          Date: 07/02/2020,   MRN# [id number] Mark [name] 09/24/1951     AdmissionWeight: 81.6 kg                 CurrentWeight: 81.6 kg   Referring physician: Dr. Manuella Ghazi   CHIEF COMPLAINT:   Necrotizing pneumonia   HISTORY OF PRESENT ILLNESS   Is a pleasant 69 year old male with a history of chronic pain syndrome with opioid-induced constipation, GERD, advanced COPD chronic hypoxemia on 4 L/min supplemental oxygen home O2.  Also has a background history of hypothyroidism, peripheral neuropathy chronic lumbago, reports worsening respiratory status x2 to 3 months with signs and symptoms of acute COPD exacerbation with increased volume and darkening of phlegm on expectoration.  Most recently in the last 2 weeks prior to admission he developed chills diaphoresis and subjective fevers.  In the ER he was found to have leukocytosis, negative COVID-19 test x2, increased O2 requirement from 4 to 5 L/min nasal cannula however venous blood gas was essentially normal BNP and troponin also within reference range.  CT chest shows necrotizing pneumonia with consolidative infiltrate of the left upper and lower lung zones as well as fluid level cystic lesion suggestive of abscess with sorrounding emphysema.  Pulmonary consultation placed for further evaluation and management of necrotizing pneumonia.  He has crusted swollen erythematous feet with intertrigo, he has distended abdomen, poor dentition, and reports broken back. He shares that he has no family and when asked regarding medical proxy he states "im on my own".  Overall he looks chronically ill.    06/20/20- patient had a good night sleep and now is more lucid, hes drinking coffee.  He is speaking rapidly with cicumferential and tangential language. WBC count is with mild trend up.   06/22/20- patient is DNR/DNI after speaking to palliative today - appreciate collaboration. Sputum with GPCs. Patient requests MDI  inhaler we discussed use of Spiriva and Symbicort.  Mild hypokalemia this am, pharmacy consult for repletion. Repeat CXR in am.    06/23/20- patient appears clincally improved this morning.  CXR this am reviewed by me - left lung cavitary pneumonia and superimposed combined pulmonary fibrosis and emphysema. Patient is on 4L/min [place]  06/24/20-patient is similar as yesterday he complains of post prandial substernal chest discomfort, we discussed potential GERD related pain. He has extensive emphysema and will unlikely be a good candidate for bronchoscopy. We will order spirometry to evaluate candidacy for anesthesia.   06/25/20- patient is slightly imrproved this am.  Leukocytosis finally trending down, and he is coughing up more phlegm thickened brown material possibly aspirated food stuffs. He has GERD will order lidocaine GI coctail and carafate.   06/26/20- patient is on 3L/min, he complains of thrush but throat, lingual surface and upper airway are clear. He had barium study yesterday due to complaints of severe dysphagia but that was also unremarkable. He is speaking in full sentences without desaturation and breathing on auscultation is inmproved  Bilateraly.   06/27/20- patient is more conversive, hes saturating >95% on 4L can wean down as able. He is coughing up more brown debris, I will recollect resp culture.  Left and right sided auscultation is imrpoved.   06/29/20- patient is improved, he is on home setting of 3-4L/min.  He is smiling.  He has 4 bananas for lunch, have asked him to pace himself so he does not get constipated or with elelctrolyte derrangement.   06/29/20-  patient is improved he is being discharged today. We discussed post hospitalization follow up. He had COVID19 vaccine in left shoulder today, he denies pain but states "strong onset of fatigue"  06/30/20- patient has developed rash. He is breathing well, there is ronchorous breaths sounds but no wheezing patient states he had a  breathing treatment prior to my arrival. He complains of having a dirty sponge bath with feces all over his body.    07/02/20- rash has improved slightly. Patient seems more alert and he is breathing better. He still has rhonchi bialterally and I suspect his baseline isclose to this.  He should work with PT, they documented that he declined to participate in physical therapy. I have advised patient to please try hard to work with him. Will start patient on decadron due to COPD and rash.    PAST MEDICAL HISTORY   Past Medical History:  Diagnosis Date  . Acute respiratory failure with hypoxia and hypercapnia ([place]) 05/01/2016  . Asthma   . COPD (chronic obstructive pulmonary disease) ([place])   . Osteoporosis      SURGICAL HISTORY   Past Surgical History:  Procedure Laterality Date  . BACK SURGERY       FAMILY HISTORY   Family History  Problem Relation Age of Onset  . Pulmonary fibrosis Mother      SOCIAL HISTORY   Social History   Tobacco Use  . Smoking status: Former Research scientist (life sciences)  . Smokeless tobacco: Never Used  Substance Use Topics  . Alcohol use: No  . Drug use: No     MEDICATIONS    Home Medication:    Current Medication:  Current Facility-Administered Medications:  .  0.9 %  sodium chloride infusion, , Intravenous, PRN, Loletha Grayer, MD, Last Rate: 10 mL/hr at 07/02/20 1216, 1,000 mL at 07/02/20 1216 .  albuterol (PROVENTIL) (2.5 MG/3ML) 0.083% nebulizer solution 3 mL, 3 mL, Inhalation, Q4H PRN, Lenore Cordia, MD, 3 mL at 06/25/20 0152 .  alum & mag hydroxide-simeth (MAALOX/MYLANTA) 200-200-20 MG/5ML suspension 30 mL, 30 mL, Oral, Q6H PRN **AND** lidocaine (XYLOCAINE) 2 % viscous mouth solution 15 mL, 15 mL, Oral, Once, Nasya Vincent, MD .  aspirin EC tablet 81 mg, 81 mg, Oral, Daily, Kurtis Bushman, Sahar, MD, 81 mg at 07/02/20 0926 .  chlorpheniramine-HYDROcodone (TUSSIONEX) 10-8 MG/5ML suspension 5 mL, 5 mL, Oral, Q4H PRN, Max Sane, MD, 5 mL at 07/02/20 0944 .   clonazePAM (KLONOPIN) tablet 1 mg, 1 mg, Oral, BID, Zada Finders R, MD, 1 mg at 07/01/20 2105 .  [START ON 07/03/2020] doxycycline (VIBRA-TABS) tablet 100 mg, 100 mg, Oral, Q12H, Ravishankar, Jayashree, MD .  enoxaparin (LOVENOX) injection 40 mg, 40 mg, Subcutaneous, Q24H, Zada Finders R, MD, 40 mg at 06/29/20 0958 .  fluticasone (FLONASE) 50 MCG/ACT nasal spray 2 spray, 2 spray, Each Nare, Daily PRN, Kurtis Bushman, Sahar, MD .  fluticasone furoate-vilanterol (BREO ELLIPTA) 200-25 MCG/INH 1 puff, 1 puff, Inhalation, Daily, Nolberto Hanlon, MD, 1 puff at 07/02/20 0928 .  guaiFENesin (MUCINEX) 12 hr tablet 1,200 mg, 1,200 mg, Oral, BID, Zada Finders R, MD, 1,200 mg at 07/02/20 5885 .  hydrocerin (EUCERIN) cream, , Topical, BID, Wieting, Richard, MD .  influenza vaccine adjuvanted (FLUAD) injection 0.5 mL, 0.5 mL, Intramuscular, Tomorrow-1000, Ravishankar, Jayashree, MD .  ipratropium (ATROVENT HFA) inhaler 2 puff, 2 puff, Inhalation, Q4H, Wyvonnia Dusky, MD, 2 puff at 07/02/20 1211 .  levothyroxine (SYNTHROID) tablet 25 mcg, 25 mcg, Oral, QAC breakfast, Lenore Cordia, MD, 25  mcg at 07/02/20 0524 .  metroNIDAZOLE (FLAGYL) tablet 500 mg, 500 mg, Oral, Q8H, Ravishankar, Jayashree, MD .  nystatin (MYCOSTATIN/NYSTOP) topical powder, , Topical, TID, Loletha Grayer, MD, Given at 07/02/20 [phone] .  pantoprazole (PROTONIX) EC tablet 40 mg, 40 mg, Oral, Daily, Wyvonnia Dusky, MD, 40 mg at 07/02/20 1696 .  polyethylene glycol (MIRALAX / GLYCOLAX) packet 17 g, 17 g, Oral, Daily PRN, Nolberto Hanlon, MD, 17 g at 06/22/20 1353 .  senna-docusate (Senokot-S) tablet 1 tablet, 1 tablet, Oral, BID, Nolberto Hanlon, MD, 1 tablet at 06/30/20 2046 .  sucralfate (CARAFATE) tablet 1 g, 1 g, Oral, TID WC & HS, Berdell Nevitt, MD, 1 g at 07/02/20 1211 .  triamcinolone cream (KENALOG) 0.5 %, , Topical, BID, Loletha Grayer, MD, Given at 07/02/20 [phone] .  [DISCONTINUED] fluticasone furoate-vilanterol (BREO ELLIPTA) 100-25 MCG/INH  1 puff, 1 puff, Inhalation, Daily, 1 puff at 06/22/20 0856 **AND** umeclidinium bromide (INCRUSE ELLIPTA) 62.5 MCG/INH 1 puff, 1 puff, Inhalation, Daily, Lu Duffel, RPH, 1 puff at 07/02/20 8101    ALLERGIES   Patient has no known allergies.     REVIEW OF SYSTEMS    Review of Systems:  Gen:  Denies  fever, sweats, chills weigh loss  HEENT: Denies blurred vision, double vision, ear pain, eye pain, hearing loss, nose bleeds, sore throat Cardiac:  No dizziness, chest pain or heaviness, chest tightness,edema Resp:   Reports SOB Gi: Denies swallowing difficulty, stomach pain, nausea or vomiting, diarrhea, constipation, bowel incontinence Gu:  Denies bladder incontinence, burning urine Ext:   Denies Joint pain, stiffness or swelling Skin: Denies  skin rash, easy bruising or bleeding or hives Endoc:  Denies polyuria, polydipsia , polyphagia or weight change Psych:   Denies depression, insomnia or hallucinations   Other:  All other systems negative   VS: BP 101/65 (BP Location: Right Arm)   Pulse 87   Temp 97.7 F (36.5 C) (Oral)   Resp 17   Ht 6' (1.829 m)   Wt 81.6 kg   SpO2 99%   BMI 24.41 kg/m      PHYSICAL EXAM    GENERAL:NAD, no fevers, chills, no weakness no fatigue HEAD: Normocephalic, atraumatic.  EYES: Pupils equal, round, reactive to light. Extraocular muscles intact. No scleral icterus.  MOUTH: Moist mucosal membrane. Dentition intact. No abscess noted.  EAR, NOSE, THROAT: Clear without exudates. No external lesions.  NECK: Supple. No thyromegaly. No nodules. No JVD.  PULMONARY:rhonchi bilaterally  CARDIOVASCULAR: S1 and S2. Regular rate and rhythm. No murmurs, rubs, or gallops. No edema. Pedal pulses 2+ bilaterally.  GASTROINTESTINAL: Soft, nontender, nondistended. No masses. Positive bowel sounds. No hepatosplenomegaly.  MUSCULOSKELETAL: No swelling, clubbing, or edema. Range of motion full in all extremities.  NEUROLOGIC: Cranial nerves II  through XII are intact. No gross focal neurological deficits. Sensation intact. Reflexes intact.  SKIN: crusted feet with erythemaouts intertrigo. PSYCHIATRIC: Mood, affect within normal limits. The patient is awake, alert and oriented x 3. Insight, judgment intact.       IMAGING    CT Chest W Contrast  Result Date: 06/18/2020 CLINICAL DATA:  69 year old male with shortness of breath, productive cough, pneumonia. On home oxygen. EXAM: CT CHEST WITH CONTRAST TECHNIQUE: Multidetector CT imaging of the chest was performed during intravenous contrast administration. CONTRAST:  70mL OMNIPAQUE IOHEXOL 300 MG/ML  SOLN COMPARISON:  Portable chest earlier today.  Chest CT 01/17/2020. FINDINGS: Cardiovascular: No cardiomegaly or pericardial effusion. Calcified coronary artery and aortic atherosclerosis. The central vascular structures  of the mediastinum are enhancing and appear to be patent. Mediastinum/Nodes: Mildly increased in reactive appearing mediastinal lymph nodes compared to 2020, up to 14 mm short axis. Some nodes (right paratracheal on series 2, image 47) have not significantly changed. Additionally, there is evidence of a larger 15 mm short axis left AP window node. Lungs/Pleura: Chronic pulmonary hyperinflation. Extensive chronic emphysema. Previous bullous emphysema in the left upper lung where now there is extensive consolidation with numerous small rounded areas of opacity suspicious for cavitation. There is a prominent fluid level lateral to the left hilum on series 3, image 65 suspicious for lung abscess. Abnormal interstitial thickening and peribronchial nodularity throughout the left lung. No superimposed pneumothorax. Only trace left pleural fluid. The major airways remain patent. Right lung emphysema and markings appear stable since last year. Upper Abdomen: Negative visible liver, gallbladder, spleen, pancreas, adrenal glands, kidneys, and bowel in the upper abdomen. Musculoskeletal: Chronic  compression fractures and ankylosis in the spine, with several levels of previous vertebral body augmentation, appears stable since last year. Occasional chronic rib fractures. No acute osseous abnormality identified. No chest wall abnormality. IMPRESSION: 1. 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 Consider necrotizing pneumonia as multiple areas are suspicious for cavitation, and especially a fluid-level lateral to the left hilum (series 2, image 68) strongly suggesting Lung Abscess. 2. Right lung unaffected.  Only trace left pleural fluid. 3. Reactive appearing mediastinal lymph nodes. 4. Calcified coronary artery and Aortic Atherosclerosis (ICD10-I70.0). Emphysema (ICD10-J43.9). Electronically Signed   By: Genevie Ann M.D.   On: 06/18/2020 22:14   DG Chest Port 1 View  Result Date: 06/26/2020 CLINICAL DATA:  Shortness of breath. EXAM: PORTABLE CHEST 1 VIEW COMPARISON:  June 23, 2020. FINDINGS: The heart size and mediastinal contours are within normal limits. No pneumothorax or pleural effusion is noted. Stable diffuse left lung opacities are noted consistent with pneumonia. Stable right basilar opacity is noted concerning for worsening atelectasis or pneumonia. The visualized skeletal structures are unremarkable. IMPRESSION: Stable bilateral lung opacities are noted consistent with multifocal pneumonia. Electronically Signed   By: Marijo Conception M.D.   On: 06/26/2020 08:57   DG Chest Port 1 View  Result Date: 06/23/2020 CLINICAL DATA:  Pneumonia EXAM: PORTABLE CHEST 1 VIEW COMPARISON:  June 18, 2020 chest radiograph and chest CT FINDINGS: Extensive airspace opacity is noted throughout the left upper and left mid lung regions with areas of apparent cavitation. Less discrete infiltrate is noted throughout the remainder of the left lung, stable. There is underlying emphysematous change. Scattered areas of scarring noted on the right without edema or airspace opacity.  Heart size and pulmonary vascularity are normal. No adenopathy. No bone lesions. IMPRESSION: Underlying emphysema. Widespread airspace opacity on the left with areas of cavitary pneumonia throughout the left upper and mid lung regions, similar to recent prior studies. Underlying fibrosis elsewhere, most severe left lower lung region. Scattered areas of scarring on the right without edema or airspace opacity. Stable cardiac silhouette. Electronically Signed   By: Lowella Grip III M.D.   On: 06/23/2020 08:37   DG Chest Portable 1 View  Result Date: 06/18/2020 CLINICAL DATA:  Shortness of breath all summer lung, worsening last week, productive cough, congestion EXAM: PORTABLE CHEST 1 VIEW COMPARISON:  CT 01/17/2019, radiograph 01/17/2019 FINDINGS: There is a background chronic reticular and fibrotic interstitial changes throughout both lungs albeit with new area of superimposed consolidative opacity spanning the left mid lung to apex including an area of  potential cavitation seen more peripherally. No discernible pneumothorax or visible effusion. Cardiomediastinal contours are partially obscured by overlying opacity with the visible borders similar in appearance to the comparison studies from prior. No acute osseous or soft tissue abnormality. Degenerative changes are present in the imaged spine and shoulders. Telemetry leads overlie the chest. IMPRESSION: Consolidative opacity in the left mid lung to apex with some questionable cavitation peripherally. Recommend further characterization with CT of the chest with contrast if patient is able to tolerate. Findings superimposed on chronic reticular and fibrotic interstitial changes. These results were called by telephone at the time of interpretation on 06/18/2020 at 8:45 pm to provider [facility] , who verbally acknowledged these results. Electronically Signed   By: Lovena Le M.D.   On: 06/18/2020 20:45   DG Swallowing Func-Speech Pathology  Result  Date: 06/25/2020 Objective Swallowing Evaluation: Type of Study: MBS-Modified Barium Swallow Study  Patient Details Name: Emrys Mckamie MRN: [id number] Date of Birth: 12/14/1950 Today's Date: 06/25/2020 Time: SLP Start Time (ACUTE ONLY): 7341 -SLP Stop Time (ACUTE ONLY): 0905 SLP Time Calculation (min) (ACUTE ONLY): 30 min Past Medical History: Past Medical History: Diagnosis Date . Acute respiratory failure with hypoxia and hypercapnia ([place]) 05/01/2016 . Asthma  . COPD (chronic obstructive pulmonary disease) ([place])  . Osteoporosis  Past Surgical History: Past Surgical History: Procedure Laterality Date . BACK SURGERY   HPI: Mr Maroney is a pleasant 69 year old male with a history of chronic pain syndrome with opioid-induced constipation, GERD, advanced COPD chronic hypoxemia on 4 L/min supplemental oxygen home O2.  Also has a background history of hypothyroidism, peripheral neuropathy chronic lumbago, reports worsening respiratory status x2 to 3 months with signs and symptoms of acute COPD exacerbation with increased volume and darkening of phlegm on expectoration.  CT chest shows necrotizing pneumonia with consolidative infiltrate of the left upper and lower lung zones as well as fluid level cystic lesion suggestive of abscess with sorrounding emphysema.   Subjective: pt pleasant, conversant Assessment / Plan / Recommendation CHL IP CLINICAL IMPRESSIONS 06/25/2020 Clinical Impression Pt demonstrated grossly functional oropharyngeal abilities when consuming puree, solids, whole barium tablet, thin liquids via cup and nectar thick liquids via cup. On imaging pt presents with moderate oral phase deficits that are c/b decrased bolus management. However after talking with pt, it appears this is an incidental finding as pt reports this to be "the way I have always chewed." When consuming the above PO intake, penetration and aspiraiton were not observed. Pt reports that he doesn't have issues with swallow but after he  swallows "it feels like Kerosene is going down my esophagus." He reports raw sensation. At this time, pt appears appropriate to continue on current diet. Extensive education provided on oral care (regardless of only having 2 teeth). Skilled ST intervention is not indicated at this time.  SLP Visit Diagnosis Dysphagia, unspecified (R13.10) Attention and concentration deficit following -- Frontal lobe and executive function deficit following -- Impact on safety and function Mild aspiration risk   CHL IP TREATMENT RECOMMENDATION 06/25/2020 Treatment Recommendations No treatment recommended at this time   No flowsheet data found. CHL IP DIET RECOMMENDATION 06/25/2020 SLP Diet Recommendations Regular solids;Thin liquid Liquid Administration via Cup Medication Administration Whole meds with liquid Compensations Minimize environmental distractions;Slow rate;Small sips/bites Postural Changes Seated upright at 90 degrees   CHL IP OTHER RECOMMENDATIONS 06/25/2020 Recommended Consults -- Oral Care Recommendations Oral care BID Other Recommendations --   CHL IP FOLLOW UP RECOMMENDATIONS 06/25/2020 Follow up Recommendations None  No flowsheet data found.     CHL IP ORAL PHASE 06/25/2020 Oral Phase WFL Oral - Pudding Teaspoon -- Oral - Pudding Cup -- Oral - Honey Teaspoon -- Oral - Honey Cup -- Oral - Nectar Teaspoon -- Oral - Nectar Cup -- Oral - Nectar Straw -- Oral - Thin Teaspoon -- Oral - Thin Cup -- Oral - Thin Straw -- Oral - Puree -- Oral - Mech Soft -- Oral - Regular -- Oral - Multi-Consistency -- Oral - Pill -- Oral Phase - Comment --  CHL IP PHARYNGEAL PHASE 06/25/2020 Pharyngeal Phase WFL Pharyngeal- Pudding Teaspoon -- Pharyngeal -- Pharyngeal- Pudding Cup -- Pharyngeal -- Pharyngeal- Honey Teaspoon -- Pharyngeal -- Pharyngeal- Honey Cup -- Pharyngeal -- Pharyngeal- Nectar Teaspoon -- Pharyngeal -- Pharyngeal- Nectar Cup -- Pharyngeal -- Pharyngeal- Nectar Straw -- Pharyngeal -- Pharyngeal- Thin Teaspoon -- Pharyngeal --  Pharyngeal- Thin Cup -- Pharyngeal -- Pharyngeal- Thin Straw -- Pharyngeal -- Pharyngeal- Puree -- Pharyngeal -- Pharyngeal- Mechanical Soft -- Pharyngeal -- Pharyngeal- Regular -- Pharyngeal -- Pharyngeal- Multi-consistency -- Pharyngeal -- Pharyngeal- Pill -- Pharyngeal -- Pharyngeal Comment --  CHL IP CERVICAL ESOPHAGEAL PHASE 06/25/2020 Cervical Esophageal Phase WFL Pudding Teaspoon -- Pudding Cup -- Honey Teaspoon -- Honey Cup -- Nectar Teaspoon -- Nectar Cup -- Nectar Straw -- Thin Teaspoon -- Thin Cup -- Thin Straw -- Puree -- Mechanical Soft -- Regular -- Multi-consistency -- Pill -- Cervical Esophageal Comment -- Happi Overton 06/25/2020, 1:00 PM                 ASSESSMENT/PLAN   Left lung necrortizing pneumonia with abscess -patient with poor dentition possible anaerobic etiology -will send off sputum cultures patient states he can cough phlegm -he is altered with encephalopathy -sputum culture x 3->>>> +GPCs  -AFB sputum-negatve -histo/strep pneumo/legionella urine ag -blood cultures-neg to date -MRSA nasal PCR -procalcitonin trend -defer antimicrobial regimen to ID - currently on Unasyn>>rocephin +flagyl +vancomycin>>>Unasyn  -poor prognosis - palliative evaluation  -follow up in clinic 2wks - [facility]   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)  - chronic hypoxemia with increased O2 requirement  -his emphysema is beyond repair and patient has very poor prognosis -typical COPD carepath with duoneb, bronchopulmonary hygiene, steroids and antibiotics is appropriate for inpatient therapy   Altered mental status with confusion -improved  - possible septic encephalitis, patient admits to drinking alcohol  - VBG does not support hypercarbic encephalopathy  -patient does not lack capacity and has had psychiatric evaluation    Lower extermity foot wounds  - wound care  And podiatry evaluation - possible  Cellulitis/intertrigo    GERD with  aspiration  lidocaine malox GI cocktail q6hprn -carafate and protonix -sp Barium study with speech and swallow -  No worrisome findings    Maculopapular rash   - likely from antimicrobials  - this has been changed.    -will start decadron today   Thank you for allowing me to participate in the care of this patient.   Patient/Family are satisfied with care plan and all questions have been answered.  This document was prepared using Dragon voice recognition software and may include unintentional dictation errors.     Ottie Glazier, M.D.  Division of [place]

## 2020-07-02 NOTE — Progress Notes (Signed)
ID Late entry Pt seen on 07/01/20  Patient has a rash for a few days He says this was due to the wipes that was used on his whole body Not itching Mostly on the back No fever He also has psoriasis  O/e in no distress 133/66, HR 101, temp 98.2 Talkative chest b/l air entry- few rhonchi maculopapular rash over chest , back and back of thighs        Rt forearm at the site of previous line these is macular erythema- no tender cord  Labs CBC Latest Ref Rng & Units 07/01/2020 06/30/2020 06/29/2020  WBC 4.0 - 10.5 K/uL 14.0(H) 12.2(H) 16.0(H)  Hemoglobin 13.0 - 17.0 g/dL 10.4(L) 11.7(L) 11.4(L)  Hematocrit 39 - 52 % 32.6(L) 38.2(L) 37.1(L)  Platelets 150 - 400 K/uL 587(H) 701(H) 627(H)    CMP Latest Ref Rng & Units 07/01/2020 06/30/2020 06/29/2020  Glucose 70 - 99 mg/dL 146(H) 98 171(H)  BUN 8 - 23 mg/dL 5(L) 5(L) 7(L)  Creatinine 0.61 - 1.24 mg/dL 0.64 0.66 0.73  Sodium 135 - 145 mmol/L 135 135 133(L)  Potassium 3.5 - 5.1 mmol/L 3.5 3.4(L) 3.5  Chloride 98 - 111 mmol/L 95(L) 90(L) 93(L)  CO2 22 - 32 mmol/L 32 36(H) 34(H)  Calcium 8.9 - 10.3 mg/dL 8.0(L) 8.3(L) 7.9(L)  Total Protein 6.5 - 8.1 g/dL - - -  Total Bilirubin 0.3 - 1.2 mg/dL - - -  Alkaline Phos 38 - 126 U/L - - -  AST 15 - 41 U/L - - -  ALT 0 - 44 U/L - - -    Impression/recommendation  Maculopapular rash- likely drug allergy- betalactam- likely culprit- was on unasyn and now amox/clav He thinks it is due to the wipes which is less likely 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

## 2020-07-02 NOTE — Progress Notes (Signed)
Physical Therapy Treatment Patient Details Name: Mark [name] MRN: [id number] DOB: [birth year] Today's Date: 07/02/2020    History of Present Illness Mark [name] is a 69 yo male with onset of fever, PN back pain and increased work of breathing was diagnosed with COPD exacerbation, leukocytosis, and emphysema was noted to have necrotizing PNA.  PMHx:  asthma, COPD, acute respiratory failure, osteoporosis, 4L O2 continually avoiding falling at home    PT Comments    Pt finally agreeable to PT session. Was on 4 L upon arriving but demands to be increased to 5 L to participate in session. Does have elevated resting HR at 118 with sao2 on 4 L 94%. He required no physical assistance to transition to EOB sitting from long sitting in bed. Stood with CGA only and ambulate to doorway and return 2 x with seated rest between trials. HR did elevate to 132 with sao2 desaturating to 84%. Therapist questions reliability of reading. Quickly returns to 90% with seated rest. Pt is easily distracted and requires constant vcs to stay focused on task desired. He is more self limiting an limited by physical deficits. Recommend DC to SNF to address endurance/strength, and safe functional mobility deficits. Acute PT will continue to follow per POC and progress as able per pt tolerance.  At conclusion of session, pt was in bed with bed alarm in place, call bell in reach and pt still on 5 L [place].     Follow Up Recommendations  SNF     Equipment Recommendations  Rolling walker with 5" wheels    Recommendations for Other Services       Precautions / Restrictions Precautions Precautions: Fall Precaution Comments: monitor use of O2 Restrictions Weight Bearing Restrictions: No    Mobility  Bed Mobility Overal bed mobility: Needs Assistance Bed Mobility: Supine to Sit     Supine to sit: Supervision;HOB elevated Sit to supine: Supervision;HOB elevated   General bed mobility comments: Pt required  supervision to exit and re-enetr bed. constant vcs to focus on task and stop talking  Transfers Overall transfer level: Needs assistance Equipment used: Rolling walker (2 wheeled) Transfers: Sit to/from Stand Sit to Stand: Min guard;From elevated surface         General transfer comment: CGA for safety. Pt was on 4 L but requested  to be increased to 5 L. pt did desat to mid 80s but pt was assymptomatic. HR elevated throughout session. peaked at 132 during ambulation.  Ambulation/Gait Ambulation/Gait assistance: Min guard Gait Distance (Feet): 15 Feet Assistive device: Rolling walker (2 wheeled) Gait Pattern/deviations: WFL(Within Functional Limits);Trunk flexed Gait velocity: decreased   General Gait Details: Pt was able to ambulate from EOB to door 2 x with constant vcs for improve posture. limited by fatigue. sao2 on 5 L does desaturate to mid 80s but therapist questions pleth reliability. No symptoms. HR was elevated throughout   Stairs             Wheelchair Mobility    Modified Rankin (Stroke Patients Only)       Balance Overall balance assessment: Needs assistance Sitting-balance support: Feet supported Sitting balance-Leahy Scale: Good Sitting balance - Comments: pt sat EOB x ~ 30 minutes throughout session with feet support only   Standing balance support: Bilateral upper extremity supported;During functional activity Standing balance-Leahy Scale: Fair Standing balance comment: reliant on UE support       Cognition Arousal/Alertness: Awake/alert Behavior During Therapy: WFL for tasks assessed/performed Overall Cognitive Status: Difficult to assess  General Comments: self directs therapy session             Pertinent Vitals/Pain Pain Assessment: No/denies pain           PT Goals (current goals can now be found in the care plan section) Acute Rehab PT Goals Patient Stated Goal: to return to PLOF Progress towards PT goals:  Progressing toward goals    Frequency    Min 2X/week      PT Plan Current plan remains appropriate       AM-PAC PT "6 Clicks" Mobility   Outcome Measure  Help needed turning from your back to your side while in a flat bed without using bedrails?: A Little Help needed moving from lying on your back to sitting on the side of a flat bed without using bedrails?: A Little Help needed moving to and from a bed to a chair (including a wheelchair)?: A Little Help needed standing up from a chair using your arms (e.g., wheelchair or bedside chair)?: A Little Help needed to walk in hospital room?: A Little Help needed climbing 3-5 steps with a railing? : A Little 6 Click Score: 18    End of Session Equipment Utilized During Treatment: Gait belt;Oxygen (4 L increased to 5 L) Activity Tolerance: Patient limited by fatigue Patient left: in bed;with bed alarm set;with chair alarm set Nurse Communication: Mobility status;Other (comment) PT Visit Diagnosis: Unsteadiness on feet (R26.81);Repeated falls (R29.6);Muscle weakness (generalized) (M62.81);History of falling (Z91.81)     Time: 1530-1610 PT Time Calculation (min) (ACUTE ONLY): 40 min  Charges:  $Gait Training: 8-22 mins $Therapeutic Activity: 23-37 mins                     Julaine Fusi PTA 07/02/20, 4:28 PM

## 2020-07-02 NOTE — Progress Notes (Signed)
PT Cancellation Note  Patient Details Name: Mark [name] MRN: [id number] DOB: October 03, 1951   Cancelled Treatment:     PT attempt 2 x this AM. Pt refused at this time for multiple reasons however for no apparent medical reason. Will return after lunch per pt request.     [name] 07/02/2020, 10:49 AM

## 2020-07-02 NOTE — Progress Notes (Signed)
ID  Pt says skin itchy Wants to take a shower Still sob on activity  Patient Vitals for the past 24 hrs:  BP Temp Temp src Pulse Resp SpO2  07/02/20 1535 109/66 97.7 F (36.5 C) Oral 98 16 91 %  07/02/20 0757 101/65 97.7 F (36.5 C) Oral 87 17 99 %  07/02/20 0004 (!) 114/56 98 F (36.7 C) Oral 98 17 96 %   Awake and alert Chest b/l air entry Rhonchi  Abd soft Skin- erythematous ( less than yesterday) maculopapular rash over back and  legs and chest. Labs CBC Latest Ref Rng & Units 07/01/2020 06/30/2020 06/29/2020  WBC 4.0 - 10.5 K/uL 14.0(H) 12.2(H) 16.0(H)  Hemoglobin 13.0 - 17.0 g/dL 10.4(L) 11.7(L) 11.4(L)  Hematocrit 39 - 52 % 32.6(L) 38.2(L) 37.1(L)  Platelets 150 - 400 K/uL 587(H) 701(H) 627(H)   CMP Latest Ref Rng & Units 07/01/2020 06/30/2020 06/29/2020  Glucose 70 - 99 mg/dL 146(H) 98 171(H)  BUN 8 - 23 mg/dL 5(L) 5(L) 7(L)  Creatinine 0.61 - 1.24 mg/dL 0.64 0.66 0.73  Sodium 135 - 145 mmol/L 135 135 133(L)  Potassium 3.5 - 5.1 mmol/L 3.5 3.4(L) 3.5  Chloride 98 - 111 mmol/L 95(L) 90(L) 93(L)  CO2 22 - 32 mmol/L 32 36(H) 34(H)  Calcium 8.9 - 10.3 mg/dL 8.0(L) 8.3(L) 7.9(L)  Total Protein 6.5 - 8.1 g/dL - - -  Total Bilirubin 0.3 - 1.2 mg/dL - - -  Alkaline Phos 38 - 126 U/L - - -  AST 15 - 41 U/L - - -  ALT 0 - 44 U/L - - -     Impression/recommendation  Maculopapular rash- likely drug allergy- betalactam- likely culprit- was on unasyn and then  amox/clav He thinks it is due to the wipes which is less likely augmentin- changed to ceftriaxone + flagyl on 07/01/20-Today will change ceftriaxone to Doxy until the rash settles  Necrotizing pneumonia left lung- being treated like aspiration pneumonia If no improvement with current antibiotics will need Bronch  COPD  Discussed with patient and asked him to get OOB to chair an work with PT

## 2020-07-03 DIAGNOSIS — J449 Chronic obstructive pulmonary disease, unspecified: Secondary | ICD-10-CM | POA: Diagnosis not present

## 2020-07-03 DIAGNOSIS — H35389 Toxic maculopathy, unspecified eye: Secondary | ICD-10-CM | POA: Diagnosis not present

## 2020-07-03 DIAGNOSIS — J189 Pneumonia, unspecified organism: Secondary | ICD-10-CM | POA: Diagnosis not present

## 2020-07-03 LAB — RESPIRATORY PANEL BY RT PCR (FLU A&B, COVID)
Influenza A by PCR: NEGATIVE
Influenza B by PCR: NEGATIVE
SARS Coronavirus 2 by RT PCR: NEGATIVE

## 2020-07-03 MED ORDER — INFLUENZA VAC SPLIT QUAD 0.5 ML IM SUSY
0.5000 mL | PREFILLED_SYRINGE | Freq: Once | INTRAMUSCULAR | Status: AC
  Administered 2020-07-03: 0.5 mL via INTRAMUSCULAR
  Filled 2020-07-03: qty 0.5

## 2020-07-03 MED ORDER — OXYCODONE HCL 10 MG PO TABS
10.0000 mg | ORAL_TABLET | Freq: Four times a day (QID) | ORAL | 0 refills | Status: DC | PRN
Start: 2020-07-03 — End: 2021-02-01

## 2020-07-03 MED ORDER — TRIAMCINOLONE ACETONIDE 0.5 % EX CREA: TOPICAL_CREAM | CUTANEOUS | 0 refills | Status: DC

## 2020-07-03 MED ORDER — OXYCODONE HCL 5 MG PO TABS
10.0000 mg | ORAL_TABLET | Freq: Four times a day (QID) | ORAL | Status: DC | PRN
  Administered 2020-07-03 – 2020-07-09 (×20): 10 mg via ORAL
  Filled 2020-07-03 (×20): qty 2

## 2020-07-03 MED ORDER — HYDROCERIN EX CREA: 1.0000 "application " | TOPICAL_CREAM | Freq: Two times a day (BID) | CUTANEOUS | 0 refills | Status: DC

## 2020-07-03 MED ORDER — DOXYCYCLINE HYCLATE 100 MG PO TABS: 100.0000 mg | ORAL_TABLET | Freq: Two times a day (BID) | ORAL | 0 refills | Status: DC

## 2020-07-03 MED ORDER — OXYCODONE HCL 10 MG PO TABS
10.0000 mg | ORAL_TABLET | Freq: Four times a day (QID) | ORAL | 0 refills | Status: DC | PRN
Start: 2020-07-03 — End: 2020-07-03

## 2020-07-03 MED ORDER — CLONAZEPAM 1 MG PO TABS
1.0000 mg | ORAL_TABLET | Freq: Two times a day (BID) | ORAL | 0 refills | Status: DC
Start: 2020-07-03 — End: 2020-07-03

## 2020-07-03 MED ORDER — INFLUENZA VAC SPLIT QUAD 0.5 ML IM SUSY: 0.5000 mL | PREFILLED_SYRINGE | INTRAMUSCULAR | Status: DC

## 2020-07-03 MED ORDER — METRONIDAZOLE 500 MG PO TABS
500.0000 mg | ORAL_TABLET | Freq: Three times a day (TID) | ORAL | 0 refills | Status: DC
Start: 2020-07-03 — End: 2020-07-06

## 2020-07-03 MED ORDER — TRAZODONE HCL 50 MG PO TABS
50.0000 mg | ORAL_TABLET | Freq: Every day | ORAL | 0 refills | Status: DC
Start: 2020-07-03 — End: 2020-07-04

## 2020-07-03 MED ORDER — POLYETHYLENE GLYCOL 3350 17 G PO PACK: 17.0000 g | PACK | Freq: Every day | ORAL | 0 refills | Status: DC | PRN

## 2020-07-03 MED ORDER — CLONAZEPAM 1 MG PO TABS: 1.0000 mg | ORAL_TABLET | Freq: Two times a day (BID) | ORAL | 0 refills | Status: AC

## 2020-07-03 MED ORDER — ADULT MULTIVITAMIN W/MINERALS CH: 1.0000 | ORAL_TABLET | Freq: Every day | ORAL | 0 refills | Status: DC

## 2020-07-03 MED ORDER — SUCRALFATE 1 G PO TABS: 1.0000 g | ORAL_TABLET | Freq: Three times a day (TID) | ORAL | 0 refills | Status: DC

## 2020-07-03 MED ORDER — TRAZODONE HCL 50 MG PO TABS
50.0000 mg | ORAL_TABLET | Freq: Every day | ORAL | Status: DC
  Filled 2020-07-03: qty 1

## 2020-07-03 MED ORDER — ADULT MULTIVITAMIN W/MINERALS CH
1.0000 | ORAL_TABLET | Freq: Every day | ORAL | Status: DC
  Administered 2020-07-03 – 2020-07-09 (×7): 1 via ORAL
  Filled 2020-07-03 (×6): qty 1

## 2020-07-03 NOTE — Discharge Summary (Addendum)
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 Acute respiratory failure with hypoxia and hypercapnia ([place]) 05/01/2016  . Asthma   . COPD (chronic obstructive pulmonary disease) ([place])   . Osteoporosis     HOSPITAL COURSE:   1. Necrotizing pneumonia. Infectious disease doctor flipped the antibiotics over to doxycycline and Flagyl orally. The patient will be given a month supply of Flagyl and 2-week supply of doxycycline. Follow-up with Dr. Steva Ready infectious disease in 10 days and she will decide whether or not to keep the doxycycline or switch over to Keflex. Follow-up with Dr. Tarry Kos as outpatient will likely need repeat CT scan down the line. The patient will need a total minimum of 4 weeks of antibiotics as per infectious disease. 2. Maculopapular rash secondary to beta lactams. Patient was on Unasyn and switched over to Augmentin. Antibiotics were changed to Rocephin and Flagyl and now changed upon discharge to doxycycline and Flagyl orally. Triamcinolone cream for rash. As needed Benadryl. 3. Erythema and warmth from prior IV site on right forearm. This seems a  little bit better. Doxycycline would also cover this. 4. 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 Continue inhalers and nebulizer treatments. 5. Thrombocytosis reactive in nature. This is secondary to infection. Last platelet count down to 587. 6. Anxiety on Klonopin 7. Fungal infection groin. Continue nystatin powder 8. Hypothyroidism unspecified on levothyroxine 9. GERD on PPI and Carafate 10. Weakness physical therapy recommends rehab   DISCHARGE CONDITIONS:   Satisfactory  CONSULTS OBTAINED:  Treatment Team:  Ottie Glazier, MD Patrecia Pour, NP  DRUG ALLERGIES:   Allergies  Allergen Reactions  . Augmentin [Amoxicillin-Pot Clavulanate] Rash    Maculopapular rash after receiving several days of amoxicillin/clavulanate despite tolerating week of ampicillin/sulbactam    DISCHARGE MEDICATIONS:   Allergies as of 07/04/2020      Reactions   Augmentin [amoxicillin-pot Clavulanate] Rash   Maculopapular rash after receiving several days of amoxicillin/clavulanate despite tolerating week of ampicillin/sulbactam      Medication List    TAKE these medications   albuterol 108 (90 Base) MCG/ACT inhaler Commonly known as: VENTOLIN HFA Inhale 2 puffs into the lungs every 4 (four) hours as needed for wheezing or shortness of breath.   aspirin 81 MG tablet Take 81 mg by mouth daily.   budesonide-formoterol 160-4.5 MCG/ACT inhaler Commonly known as: SYMBICORT Inhale 2 puffs into the lungs 2 (two) times daily. Notes to patient: Not given this hospitalization   clonazePAM 1 MG tablet Commonly known as: KLONOPIN Take 1 tablet (1 mg total) by mouth 2 (two) times daily.   cyclobenzaprine 10 MG tablet Commonly known as: FLEXERIL Take 10 mg by  mouth 3 (three) times daily as needed for muscle spasms. Notes to patient: Not given this hospitalization   diphenhydrAMINE 25 mg capsule Commonly known as: BENADRYL Take 25-50 mg by mouth every 6 (six) hours as  needed for allergies.   doxycycline 100 MG tablet Commonly known as: VIBRA-TABS Take 1 tablet (100 mg total) by mouth every 12 (twelve) hours.   fluticasone 50 MCG/ACT nasal spray Commonly known as: FLONASE Place 2 sprays into both nostrils daily as needed for allergies.   guaiFENesin 600 MG 12 hr tablet Commonly known as: MUCINEX Take 2 tablets (1,200 mg total) by mouth 2 (two) times daily for 14 days.   hydrocerin Crea Apply 1 application topically 2 (two) times daily.   ibuprofen 400 MG tablet Commonly known as: ADVIL Take 1 tablet (400 mg total) by mouth every 8 (eight) hours as needed for headache or moderate pain.   ipratropium 17 MCG/ACT inhaler Commonly known as: ATROVENT HFA Inhale 2 puffs into the lungs every 6 (six) hours.   levothyroxine 25 MCG tablet Commonly known as: SYNTHROID Take 25 mcg by mouth daily before breakfast.   metroNIDAZOLE 500 MG tablet Commonly known as: FLAGYL Take 1 tablet (500 mg total) by mouth every 8 (eight) hours.   multivitamin with minerals Tabs tablet Take 1 tablet by mouth daily.   nystatin powder Commonly known as: MYCOSTATIN/NYSTOP Apply topically 3 (three) times daily.   Oxycodone HCl 10 MG Tabs Take 1 tablet (10 mg total) by mouth every 6 (six) hours as needed.   pantoprazole 40 MG tablet Commonly known as: PROTONIX Take 1 tablet (40 mg total) by mouth daily.   polyethylene glycol 17 g packet Commonly known as: MIRALAX / GLYCOLAX Take 17 g by mouth daily as needed for moderate constipation.   Primatene Mist 0.125 MG/ACT Aero Generic drug: EPINEPHrine Inhale 1 Dose into the lungs as needed (allergies).   sucralfate 1 g tablet Commonly known as: CARAFATE Take 1 tablet (1 g total) by mouth 4 (four) times daily -  with meals and at bedtime.   tiotropium 18 MCG inhalation capsule Commonly known as: SPIRIVA Place 18 mcg into inhaler and inhale daily.   Trelegy Ellipta 100-62.5-25 MCG/INH Aepb Generic drug:  Fluticasone-Umeclidin-Vilant Inhale 1 puff into the lungs daily. Notes to patient: Not given this hospitalization   triamcinolone cream 0.5 % Commonly known as: KENALOG Apply to rash chest and back bid        DISCHARGE INSTRUCTIONS:   Follow-up team at rehab 1 day Follow-up Dr. Steva Ready 10 days Follow-up Dr. Lanney Gins 2 weeks  If you experience worsening of your admission symptoms, develop shortness of breath, life threatening emergency, suicidal or homicidal thoughts you must seek medical attention immediately by calling 911 or calling your MD immediately  if symptoms less severe.  You Must read complete instructions/literature along with all the possible adverse reactions/side effects for all the Medicines you take and that have been prescribed to you. Take any new Medicines after you have completely understood and accept all the possible adverse reactions/side effects.   Please note  You were cared for by a hospitalist during your hospital stay. If you have any questions about your discharge medications or the care you received while you were in the hospital after you are discharged, you can call the unit and asked to speak with the hospitalist on call if the hospitalist that took care of you is not available. Once you are discharged, your primary care physician will handle any  further medical issues. Please note that NO REFILLS for any discharge medications will be authorized once you are discharged, as it is imperative that you return to your primary care physician (or establish a relationship with a primary care physician if you do not have one) for your aftercare needs so that they can reassess your need for medications and monitor your lab values.    Today   CHIEF COMPLAINT:   Chief Complaint  Patient presents with  . Shortness of Breath    HISTORY OF PRESENT ILLNESS:  Prosper Paff  is a 69 y.o. male came in with shortness of breath   VITAL SIGNS:  Blood  pressure 128/70, pulse 98, temperature 97.8 F (36.6 C), temperature source Oral, resp. rate 18, height 6' (1.829 m), weight 81.6 kg, SpO2 98 %.  I/O:    Intake/Output Summary (Last 24 hours) at 07/04/2020 1439 Last data filed at 07/04/2020 3419 Gross per 24 hour  Intake 70.82 ml  Output 600 ml  Net -529.18 ml    PHYSICAL EXAMINATION:  GENERAL:  69 y.o.-year-old patient lying in the bed with no acute distress.  EYES: Pupils equal, round, reactive to light and accommodation. No scleral icterus. HEENT: Head atraumatic, normocephalic. Oropharynx and nasopharynx clear.  LUNGS: Normal breath sounds bilaterally, no wheezing, rales,rhonchi or crepitation. No use of accessory muscles of respiration.  CARDIOVASCULAR: S1, S2 normal. No murmurs, rubs, or gallops.  ABDOMEN: Soft, non-tender.  EXTREMITIES: No pedal edema, cyanosis, or clubbing.  NEUROLOGIC: Cranial nerves II through XII are intact. Muscle strength 5/5 in all extremities. Sensation intact. Gait not checked.  PSYCHIATRIC: The patient is alert and oriented x 3.  SKIN: Maculopapular rash on chest and back. Worse on the right lower back. Small area on right arm at prior IV site with some redness. No warmth at this time. Some lower extremity dry skin and looks like psoriasis.  DATA REVIEW:   CBC Recent Labs  Lab 07/01/20 0617  WBC 14.0*  HGB 10.4*  HCT 32.6*  PLT 587*    Chemistries  Recent Labs  Lab 07/01/20 0617  NA 135  K 3.5  CL 95*  CO2 32  GLUCOSE 146*  BUN 5*  CREATININE 0.64  CALCIUM 8.0*    Microbiology Results  Results for orders placed or performed during the hospital encounter of 06/18/20  SARS Coronavirus 2 by RT PCR (hospital order, performed in [facility] hospital lab) Nasopharyngeal Nasopharyngeal Swab     Status: None   Collection Time: 06/18/20  8:53 PM   Specimen: Nasopharyngeal Swab  Result Value Ref Range Status   SARS Coronavirus 2 NEGATIVE NEGATIVE Final    Comment: (NOTE) SARS-CoV-2  target nucleic acids are NOT DETECTED.  The SARS-CoV-2 RNA is generally detectable in upper and lower respiratory specimens during the acute phase of infection. The lowest concentration of SARS-CoV-2 viral copies this assay can detect is 250 copies / mL. A negative result does not preclude SARS-CoV-2 infection and should not be used as the sole basis for treatment or other patient management decisions.  A negative result may occur with improper specimen collection / handling, submission of specimen other than nasopharyngeal swab, presence of viral mutation(s) within the areas targeted by this assay, and inadequate number of viral copies (<250 copies / mL). A negative result must be combined with clinical observations, patient history, and epidemiological information.  Fact Sheet for Patients:   StrictlyIdeas.no  Fact Sheet for Healthcare Providers: BankingDealers.co.za  This test is not yet approved  or  cleared by the [place] and has been authorized for detection and/or diagnosis of SARS-CoV-2 by FDA under an Emergency Use Authorization (EUA).  This EUA will remain in effect (meaning this test can be used) for the duration of the COVID-19 declaration under Section 564(b)(1) of the Act, 21 U.S.C. section 360bbb-3(b)(1), unless the authorization is terminated or revoked sooner.  Performed at [facility], [address] [place], [place] [address]   Culture, blood (routine x 2)     Status: None   Collection Time: 06/18/20  8:54 PM   Specimen: BLOOD  Result Value Ref Range Status   Specimen Description BLOOD RIGHT FA  Final   Special Requests   Final    BOTTLES DRAWN AEROBIC AND ANAEROBIC Blood Culture adequate volume   Culture   Final    NO GROWTH 5 DAYS Performed at [facility], [address], [place], [place] [address]    Report Status 06/23/2020 FINAL  Final  Culture, blood (routine x 2)      Status: None   Collection Time: 06/18/20  8:54 PM   Specimen: BLOOD  Result Value Ref Range Status   Specimen Description BLOOD LEFT FA  Final   Special Requests   Final    BOTTLES DRAWN AEROBIC AND ANAEROBIC Blood Culture adequate volume   Culture   Final    NO GROWTH 5 DAYS Performed at [facility], [address] [place], [place] [address]    Report Status 06/23/2020 FINAL  Final  Culture, sputum-assessment     Status: None   Collection Time: 06/19/20  7:28 AM   Specimen: Sputum  Result Value Ref Range Status   Specimen Description SPUTUM  Final   Special Requests NONE  Final   Sputum evaluation   Final    THIS SPECIMEN IS ACCEPTABLE FOR SPUTUM CULTURE Performed at [facility], [address] [place], [place] [address]    Report Status 06/19/2020 FINAL  Final  Culture, respiratory     Status: None   Collection Time: 06/19/20  7:28 AM   Specimen: SPU  Result Value Ref Range Status   Specimen Description   Final    SPUTUM Performed at [facility], [address], [place], [place] [address]    Special Requests   Final    NONE Reflexed from [id number] Performed at [facility], [place], [place] [address]    Gram Stain   Final    MODERATE WBC PRESENT,BOTH PMN AND MONONUCLEAR MODERATE GRAM POSITIVE COCCI FEW GRAM VARIABLE ROD RARE YEAST    Culture   Final    FEW Normal respiratory flora-no Staph aureus or Pseudomonas seen Performed at [facility], [place] [address] [place], [place] [address]    Report Status 06/21/2020 FINAL  Final  Surgical pcr screen     Status: None   Collection Time: 06/22/20  3:22 PM   Specimen: Nasal Mucosa; Nasal Swab  Result Value Ref Range Status   MRSA, PCR NEGATIVE NEGATIVE Final   Staphylococcus aureus NEGATIVE NEGATIVE Final    Comment: (NOTE) The Xpert SA Assay (FDA approved for NASAL specimens in patients 36 years of age and older), is one component of a  comprehensive surveillance program. It is not intended to diagnose infection nor to guide or monitor treatment. Performed at [facility], [address], [place], [place] [address]   Expectorated sputum assessment w rflx to resp cult  Status: None   Collection Time: 06/28/20  1:10 AM   Specimen: Expectorated Sputum  Result Value Ref Range Status   Specimen Description EXPECTORATED SPUTUM  Final   Special Requests NONE  Final   Sputum evaluation   Final    THIS SPECIMEN IS ACCEPTABLE FOR SPUTUM CULTURE Performed at [facility], [address], [place], [place] [address]    Report Status 06/28/2020 FINAL  Final  Culture, respiratory     Status: None   Collection Time: 06/28/20  1:10 AM  Result Value Ref Range Status   Specimen Description   Final    EXPECTORATED SPUTUM Performed at [facility], [address] [place], [place] [address]    Special Requests   Final    NONE Reflexed from [id number] Performed at [facility], [address] [place],  [address]    Gram Stain   Final    RARE WBC PRESENT, PREDOMINANTLY PMN NO ORGANISMS SEEN Performed at [facility], [place] [address] [place],  [address]    Culture RARE CANDIDA ALBICANS  Final   Report Status 06/30/2020 FINAL  Final  Respiratory Panel by RT PCR (Flu A&B, Covid) - Nasopharyngeal Swab     Status: None   Collection Time: 07/03/20 10:18 AM   Specimen: Nasopharyngeal Swab  Result Value Ref Range Status   SARS Coronavirus 2 by RT PCR NEGATIVE NEGATIVE Final    Comment: (NOTE) SARS-CoV-2 target nucleic acids are NOT DETECTED.  The SARS-CoV-2 RNA is generally detectable in upper respiratoy specimens during the acute phase of infection. The lowest concentration of SARS-CoV-2 viral copies this assay can detect is 131 copies/mL. A negative result does not preclude SARS-Cov-2 infection and should not be used as the sole basis for treatment or other patient  management decisions. A negative result may occur with  improper specimen collection/handling, submission of specimen other than nasopharyngeal swab, presence of viral mutation(s) within the areas targeted by this assay, and inadequate number of viral copies (<131 copies/mL). A negative result must be combined with clinical observations, patient history, and epidemiological information. The expected result is Negative.  Fact Sheet for Patients:  PinkCheek.be  Fact Sheet for Healthcare Providers:  GravelBags.it  This test is no t yet approved or cleared by the [place] FDA and  has been authorized for detection and/or diagnosis of SARS-CoV-2 by FDA under an Emergency Use Authorization (EUA). This EUA will remain  in effect (meaning this test can be used) for the duration of the COVID-19 declaration under Section 564(b)(1) of the Act, 21 U.S.C. section 360bbb-3(b)(1), unless the authorization is terminated or revoked sooner.     Influenza A by PCR NEGATIVE NEGATIVE Final   Influenza B by PCR NEGATIVE NEGATIVE Final    Comment: (NOTE) The Xpert Xpress SARS-CoV-2/FLU/RSV assay is intended as an aid in  the diagnosis of influenza from Nasopharyngeal swab specimens and  should not be used as a sole basis for treatment. Nasal washings and  aspirates are unacceptable for Xpert Xpress SARS-CoV-2/FLU/RSV  testing.  Fact Sheet for Patients: PinkCheek.be  Fact Sheet for Healthcare Providers: GravelBags.it  This test is not yet approved or cleared by the [place] FDA and  has been authorized for detection and/or diagnosis of SARS-CoV-2 by  FDA under an Emergency Use Authorization (EUA). This EUA will remain  in effect (meaning this test can be used) for the duration of the  Covid-19 declaration under Section 564(b)(1) of the Act, 21  U.S.C. section 360bbb-3(b)(1),  unless the authorization is  terminated or revoked. Performed at [facility], [address] [place], [place] [address]       Management plans discussed with the patient.  CODE STATUS:     Code Status Orders  (From admission, onward)         Start     Ordered   06/22/20 1409  Do not attempt resuscitation (DNR)  Continuous       Question Answer Comment  In the event of cardiac or respiratory ARREST Do not call a "code blue"   In the event of cardiac or respiratory ARREST Do not perform Intubation, CPR, defibrillation or ACLS   In the event of cardiac or respiratory ARREST Use medication by any route, position, wound care, and other measures to relive pain and suffering. May use oxygen, suction and manual treatment of airway obstruction as needed for comfort.   Comments MOST form on chart.      06/22/20 1409        Code Status History    Date Active Date Inactive Code Status Order ID Comments User Context   06/18/2020 2325 06/22/2020 1409 Full Code [id number]  Lenore Cordia, MD ED   01/18/2019 0010 01/18/2019 2118 Full Code [id number]  Lance Coon, MD Inpatient   05/01/2016 0934 05/03/2016 1455 Full Code [id number]  Harrie Foreman, MD Inpatient   Advance Care Planning Activity      TOTAL TIME TAKING CARE OF THIS PATIENT: 40 minutes.    Loletha Grayer M.D on 07/04/2020 at 2:39 PM  Between 7am to 6pm - Pager - [phone]  After 6pm go to [email or web address] - password [email or web address]  [facility]  CC: Primary care physician; [place], Delcie Roch, MD

## 2020-07-03 NOTE — Progress Notes (Signed)
Pulmonary Medicine          Date: 07/03/2020,   MRN# [id number] Mark [name] 05-30-1951     AdmissionWeight: 81.6 kg                 CurrentWeight: 81.6 kg   Referring physician: Dr. Manuella Ghazi   CHIEF COMPLAINT:   Necrotizing pneumonia   HISTORY OF PRESENT ILLNESS   Is a pleasant 69 year old male with a history of chronic pain syndrome with opioid-induced constipation, GERD, advanced COPD chronic hypoxemia on 4 L/min supplemental oxygen home O2.  Also has a background history of hypothyroidism, peripheral neuropathy chronic lumbago, reports worsening respiratory status x2 to 3 months with signs and symptoms of acute COPD exacerbation with increased volume and darkening of phlegm on expectoration.  Most recently in the last 2 weeks prior to admission he developed chills diaphoresis and subjective fevers.  In the ER he was found to have leukocytosis, negative COVID-19 test x2, increased O2 requirement from 4 to 5 L/min nasal cannula however venous blood gas was essentially normal BNP and troponin also within reference range.  CT chest shows necrotizing pneumonia with consolidative infiltrate of the left upper and lower lung zones as well as fluid level cystic lesion suggestive of abscess with sorrounding emphysema.  Pulmonary consultation placed for further evaluation and management of necrotizing pneumonia.  He has crusted swollen erythematous feet with intertrigo, he has distended abdomen, poor dentition, and reports broken back. He shares that he has no family and when asked regarding medical proxy he states "im on my own".  Overall he looks chronically ill.    06/20/20- patient had a good night sleep and now is more lucid, hes drinking coffee.  He is speaking rapidly with cicumferential and tangential language. WBC count is with mild trend up.   06/22/20- patient is DNR/DNI after speaking to palliative today - appreciate collaboration. Sputum with GPCs. Patient requests MDI  inhaler we discussed use of Spiriva and Symbicort.  Mild hypokalemia this am, pharmacy consult for repletion. Repeat CXR in am.    06/23/20- patient appears clincally improved this morning.  CXR this am reviewed by me - left lung cavitary pneumonia and superimposed combined pulmonary fibrosis and emphysema. Patient is on 4L/min [place]  06/24/20-patient is similar as yesterday he complains of post prandial substernal chest discomfort, we discussed potential GERD related pain. He has extensive emphysema and will unlikely be a good candidate for bronchoscopy. We will order spirometry to evaluate candidacy for anesthesia.   06/25/20- patient is slightly imrproved this am.  Leukocytosis finally trending down, and he is coughing up more phlegm thickened brown material possibly aspirated food stuffs. He has GERD will order lidocaine GI coctail and carafate.   06/26/20- patient is on 3L/min, he complains of thrush but throat, lingual surface and upper airway are clear. He had barium study yesterday due to complaints of severe dysphagia but that was also unremarkable. He is speaking in full sentences without desaturation and breathing on auscultation is inmproved  Bilateraly.   06/27/20- patient is more conversive, hes saturating >95% on 4L can wean down as able. He is coughing up more brown debris, I will recollect resp culture.  Left and right sided auscultation is imrpoved.   06/29/20- patient is improved, he is on home setting of 3-4L/min.  He is smiling.  He has 4 bananas for lunch, have asked him to pace himself so he does not get constipated or with elelctrolyte derrangement.   06/29/20-  patient is improved he is being discharged today. We discussed post hospitalization follow up. He had COVID19 vaccine in left shoulder today, he denies pain but states "strong onset of fatigue"  06/30/20- patient has developed rash. He is breathing well, there is ronchorous breaths sounds but no wheezing patient states he had a  breathing treatment prior to my arrival. He complains of having a dirty sponge bath with feces all over his body.    07/02/20- rash has improved slightly. Patient seems more alert and he is breathing better. He still has rhonchi bialterally and I suspect his baseline isclose to this.  He should work with PT, they documented that he declined to participate in physical therapy. I have advised patient to please try hard to work with him. Will start patient on decadron due to COPD and rash.   07/03/20- patient is upset today , he is concerned with coersion from medicare.  I discussed his d/c plan with case manager. He does not wish to be discharged but hes stable and is speaking in full sentences with pressures speech.    PAST MEDICAL HISTORY   Past Medical History:  Diagnosis Date   Acute respiratory failure with hypoxia and hypercapnia (HCC) 05/01/2016   Asthma    COPD (chronic obstructive pulmonary disease) (HCC)    Osteoporosis      SURGICAL HISTORY   Past Surgical History:  Procedure Laterality Date   BACK SURGERY       FAMILY HISTORY   Family History  Problem Relation Age of Onset   Pulmonary fibrosis Mother      SOCIAL HISTORY   Social History   Tobacco Use   Smoking status: Former Smoker   Smokeless tobacco: Never Used  Substance Use Topics   Alcohol use: No   Drug use: No     MEDICATIONS    Home Medication:    Current Medication:  Current Facility-Administered Medications:    0.9 %  sodium chloride infusion, , Intravenous, PRN, Loletha Grayer, MD, Stopped at 07/03/20 1205   albuterol (PROVENTIL) (2.5 MG/3ML) 0.083% nebulizer solution 3 mL, 3 mL, Inhalation, Q4H PRN, Zada Finders R, MD, 3 mL at 06/25/20 0152   alum & mag hydroxide-simeth (MAALOX/MYLANTA) 200-200-20 MG/5ML suspension 30 mL, 30 mL, Oral, Q6H PRN **AND** lidocaine (XYLOCAINE) 2 % viscous mouth solution 15 mL, 15 mL, Oral, Once, Khloe Hunkele, MD   aspirin EC tablet 81 mg, 81  mg, Oral, Daily, Amery, Sahar, MD, 81 mg at 07/03/20 0925   chlorpheniramine-HYDROcodone (TUSSIONEX) 10-8 MG/5ML suspension 5 mL, 5 mL, Oral, Q4H PRN, Max Sane, MD, 5 mL at 07/03/20 0602   clonazePAM (KLONOPIN) tablet 1 mg, 1 mg, Oral, BID, Zada Finders R, MD, 1 mg at 07/02/20 2205   dexamethasone (DECADRON) injection 4 mg, 4 mg, Intravenous, Q24H, Tkai Serfass, MD, 4 mg at 07/02/20 1806   doxycycline (VIBRA-TABS) tablet 100 mg, 100 mg, Oral, Q12H, Ravishankar, Jayashree, MD, 100 mg at 07/03/20 1307   enoxaparin (LOVENOX) injection 40 mg, 40 mg, Subcutaneous, Q24H, Patel, Vishal R, MD, 40 mg at 06/29/20 0958   fluticasone (FLONASE) 50 MCG/ACT nasal spray 2 spray, 2 spray, Each Nare, Daily PRN, Kurtis Bushman, Sahar, MD   fluticasone furoate-vilanterol (BREO ELLIPTA) 200-25 MCG/INH 1 puff, 1 puff, Inhalation, Daily, Amery, Sahar, MD, 1 puff at 07/03/20 0927   guaiFENesin (MUCINEX) 12 hr tablet 1,200 mg, 1,200 mg, Oral, BID, Zada Finders R, MD, 1,200 mg at 07/03/20 0973   hydrocerin (EUCERIN) cream, , Topical, BID, Wieting,  Richard, MD, Given at 07/03/20 0931   influenza vaccine adjuvanted (FLUAD) injection 0.5 mL, 0.5 mL, Intramuscular, Tomorrow-1000, Ravishankar, Jayashree, MD   ipratropium (ATROVENT HFA) inhaler 2 puff, 2 puff, Inhalation, Q4H, Wyvonnia Dusky, MD, 2 puff at 07/03/20 1303   levothyroxine (SYNTHROID) tablet 25 mcg, 25 mcg, Oral, QAC breakfast, Zada Finders R, MD, 25 mcg at 07/03/20 0524   metroNIDAZOLE (FLAGYL) tablet 500 mg, 500 mg, Oral, Q8H, Ravishankar, Joellyn Quails, MD, 500 mg at 07/03/20 0524   multivitamin with minerals tablet 1 tablet, 1 tablet, Oral, Daily, Loletha Grayer, MD, 1 tablet at 07/03/20 1037   nystatin (MYCOSTATIN/NYSTOP) topical powder, , Topical, TID, Loletha Grayer, MD, Given at 07/03/20 1548   oxyCODONE (Oxy IR/ROXICODONE) immediate release tablet 10 mg, 10 mg, Oral, Q6H PRN, Leslye Peer, Richard, MD   pantoprazole (PROTONIX) EC tablet 40  mg, 40 mg, Oral, Daily, Wyvonnia Dusky, MD, 40 mg at 07/03/20 8315   polyethylene glycol (MIRALAX / GLYCOLAX) packet 17 g, 17 g, Oral, Daily PRN, Nolberto Hanlon, MD, 17 g at 06/22/20 1353   senna-docusate (Senokot-S) tablet 1 tablet, 1 tablet, Oral, BID, Nolberto Hanlon, MD, 1 tablet at 07/02/20 2205   sucralfate (CARAFATE) tablet 1 g, 1 g, Oral, TID WC & HS, Lanney Gins, Edla Para, MD, 1 g at 07/03/20 1302   traZODone (DESYREL) tablet 50 mg, 50 mg, Oral, QHS, Wieting, Richard, MD   triamcinolone cream (KENALOG) 0.5 %, , Topical, BID, Loletha Grayer, MD, Given at 07/03/20 0931   [DISCONTINUED] fluticasone furoate-vilanterol (BREO ELLIPTA) 100-25 MCG/INH 1 puff, 1 puff, Inhalation, Daily, 1 puff at 06/22/20 0856 **AND** umeclidinium bromide (INCRUSE ELLIPTA) 62.5 MCG/INH 1 puff, 1 puff, Inhalation, Daily, Shanlever, Pierce Crane, RPH, 1 puff at 07/03/20 1037    ALLERGIES   Augmentin [amoxicillin-pot clavulanate]     REVIEW OF SYSTEMS    Review of Systems:  Gen:  Denies  fever, sweats, chills weigh loss  HEENT: Denies blurred vision, double vision, ear pain, eye pain, hearing loss, nose bleeds, sore throat Cardiac:  No dizziness, chest pain or heaviness, chest tightness,edema Resp:   Reports SOB Gi: Denies swallowing difficulty, stomach pain, nausea or vomiting, diarrhea, constipation, bowel incontinence Gu:  Denies bladder incontinence, burning urine Ext:   Denies Joint pain, stiffness or swelling Skin: Denies  skin rash, easy bruising or bleeding or hives Endoc:  Denies polyuria, polydipsia , polyphagia or weight change Psych:   Denies depression, insomnia or hallucinations   Other:  All other systems negative   VS: BP 117/78 (BP Location: Right Arm)    Pulse (!) 108    Temp 97.8 F (36.6 C) (Oral)    Resp 17    Ht 6' (1.829 m)    Wt 81.6 kg    SpO2 97%    BMI 24.41 kg/m      PHYSICAL EXAM    GENERAL:NAD, no fevers, chills, no weakness no fatigue HEAD: Normocephalic,  atraumatic.  EYES: Pupils equal, round, reactive to light. Extraocular muscles intact. No scleral icterus.  MOUTH: Moist mucosal membrane. Dentition intact. No abscess noted.  EAR, NOSE, THROAT: Clear without exudates. No external lesions.  NECK: Supple. No thyromegaly. No nodules. No JVD.  PULMONARY:rhonchi bilaterally  CARDIOVASCULAR: S1 and S2. Regular rate and rhythm. No murmurs, rubs, or gallops. No edema. Pedal pulses 2+ bilaterally.  GASTROINTESTINAL: Soft, nontender, nondistended. No masses. Positive bowel sounds. No hepatosplenomegaly.  MUSCULOSKELETAL: No swelling, clubbing, or edema. Range of motion full in all extremities.  NEUROLOGIC: Cranial nerves II through XII  are intact. No gross focal neurological deficits. Sensation intact. Reflexes intact.  SKIN: crusted feet with erythemaouts intertrigo. PSYCHIATRIC: Mood, affect within normal limits. The patient is awake, alert and oriented x 3. Insight, judgment intact.       IMAGING    CT Chest W Contrast  Result Date: 06/18/2020 CLINICAL DATA:  69 year old male with shortness of breath, productive cough, pneumonia. On home oxygen. EXAM: CT CHEST WITH CONTRAST TECHNIQUE: Multidetector CT imaging of the chest was performed during intravenous contrast administration. CONTRAST:  25mL OMNIPAQUE IOHEXOL 300 MG/ML  SOLN COMPARISON:  Portable chest earlier today.  Chest CT 01/17/2020. FINDINGS: Cardiovascular: No cardiomegaly or pericardial effusion. Calcified coronary artery and aortic atherosclerosis. The central vascular structures of the mediastinum are enhancing and appear to be patent. Mediastinum/Nodes: Mildly increased in reactive appearing mediastinal lymph nodes compared to 2020, up to 14 mm short axis. Some nodes (right paratracheal on series 2, image 47) have not significantly changed. Additionally, there is evidence of a larger 15 mm short axis left AP window node. Lungs/Pleura: Chronic pulmonary hyperinflation. Extensive chronic  emphysema. Previous bullous emphysema in the left upper lung where now there is extensive consolidation with numerous small rounded areas of opacity suspicious for cavitation. There is a prominent fluid level lateral to the left hilum on series 3, image 65 suspicious for lung abscess. Abnormal interstitial thickening and peribronchial nodularity throughout the left lung. No superimposed pneumothorax. Only trace left pleural fluid. The major airways remain patent. Right lung emphysema and markings appear stable since last year. Upper Abdomen: Negative visible liver, gallbladder, spleen, pancreas, adrenal glands, kidneys, and bowel in the upper abdomen. Musculoskeletal: Chronic compression fractures and ankylosis in the spine, with several levels of previous vertebral body augmentation, appears stable since last year. Occasional chronic rib fractures. No acute osseous abnormality identified. No chest wall abnormality. IMPRESSION: 1. 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 Consider necrotizing pneumonia as multiple areas are suspicious for cavitation, and especially a fluid-level lateral to the left hilum (series 2, image 68) strongly suggesting Lung Abscess. 2. Right lung unaffected.  Only trace left pleural fluid. 3. Reactive appearing mediastinal lymph nodes. 4. Calcified coronary artery and Aortic Atherosclerosis (ICD10-I70.0). Emphysema (ICD10-J43.9). Electronically Signed   By: Genevie Ann M.D.   On: 06/18/2020 22:14   DG Chest Port 1 View  Result Date: 06/26/2020 CLINICAL DATA:  Shortness of breath. EXAM: PORTABLE CHEST 1 VIEW COMPARISON:  June 23, 2020. FINDINGS: The heart size and mediastinal contours are within normal limits. No pneumothorax or pleural effusion is noted. Stable diffuse left lung opacities are noted consistent with pneumonia. Stable right basilar opacity is noted concerning for worsening atelectasis or pneumonia. The visualized skeletal structures are  unremarkable. IMPRESSION: Stable bilateral lung opacities are noted consistent with multifocal pneumonia. Electronically Signed   By: Marijo Conception M.D.   On: 06/26/2020 08:57   DG Chest Port 1 View  Result Date: 06/23/2020 CLINICAL DATA:  Pneumonia EXAM: PORTABLE CHEST 1 VIEW COMPARISON:  June 18, 2020 chest radiograph and chest CT FINDINGS: Extensive airspace opacity is noted throughout the left upper and left mid lung regions with areas of apparent cavitation. Less discrete infiltrate is noted throughout the remainder of the left lung, stable. There is underlying emphysematous change. Scattered areas of scarring noted on the right without edema or airspace opacity. Heart size and pulmonary vascularity are normal. No adenopathy. No bone lesions. IMPRESSION: Underlying emphysema. Widespread airspace opacity on the left with areas of cavitary  pneumonia throughout the left upper and mid lung regions, similar to recent prior studies. Underlying fibrosis elsewhere, most severe left lower lung region. Scattered areas of scarring on the right without edema or airspace opacity. Stable cardiac silhouette. Electronically Signed   By: Lowella Grip III M.D.   On: 06/23/2020 08:37   DG Chest Portable 1 View  Result Date: 06/18/2020 CLINICAL DATA:  Shortness of breath all summer lung, worsening last week, productive cough, congestion EXAM: PORTABLE CHEST 1 VIEW COMPARISON:  CT 01/17/2019, radiograph 01/17/2019 FINDINGS: There is a background chronic reticular and fibrotic interstitial changes throughout both lungs albeit with new area of superimposed consolidative opacity spanning the left mid lung to apex including an area of potential cavitation seen more peripherally. No discernible pneumothorax or visible effusion. Cardiomediastinal contours are partially obscured by overlying opacity with the visible borders similar in appearance to the comparison studies from prior. No acute osseous or soft tissue  abnormality. Degenerative changes are present in the imaged spine and shoulders. Telemetry leads overlie the chest. IMPRESSION: Consolidative opacity in the left mid lung to apex with some questionable cavitation peripherally. Recommend further characterization with CT of the chest with contrast if patient is able to tolerate. Findings superimposed on chronic reticular and fibrotic interstitial changes. These results were called by telephone at the time of interpretation on 06/18/2020 at 8:45 pm to provider [facility] , who verbally acknowledged these results. Electronically Signed   By: Lovena Le M.D.   On: 06/18/2020 20:45   DG Swallowing Func-Speech Pathology  Result Date: 06/25/2020 Objective Swallowing Evaluation: Type of Study: MBS-Modified Barium Swallow Study  Patient Details Name: Mark [name] MRN: [id number] Date of Birth: [birth year] Today's Date: 06/25/2020 Time: SLP Start Time (ACUTE ONLY): 0300 -SLP Stop Time (ACUTE ONLY): 0905 SLP Time Calculation (min) (ACUTE ONLY): 30 min Past Medical History: Past Medical History: Diagnosis Date  Acute respiratory failure with hypoxia and hypercapnia (HCC) 05/01/2016  Asthma   COPD (chronic obstructive pulmonary disease) ([place])   Osteoporosis  Past Surgical History: Past Surgical History: Procedure Laterality Date  BACK SURGERY   HPI: Mark [name] is a pleasant 69 year old male with a history of chronic pain syndrome with opioid-induced constipation, GERD, advanced COPD chronic hypoxemia on 4 L/min supplemental oxygen home O2.  Also has a background history of hypothyroidism, peripheral neuropathy chronic lumbago, reports worsening respiratory status x2 to 3 months with signs and symptoms of acute COPD exacerbation with increased volume and darkening of phlegm on expectoration.  CT chest shows necrotizing pneumonia with consolidative infiltrate of the left upper and lower lung zones as well as fluid level cystic lesion suggestive of abscess with  sorrounding emphysema.   Subjective: pt pleasant, conversant Assessment / Plan / Recommendation CHL IP CLINICAL IMPRESSIONS 06/25/2020 Clinical Impression Pt demonstrated grossly functional oropharyngeal abilities when consuming puree, solids, whole barium tablet, thin liquids via cup and nectar thick liquids via cup. On imaging pt presents with moderate oral phase deficits that are c/b decrased bolus management. However after talking with pt, it appears this is an incidental finding as pt reports this to be "the way I have always chewed." When consuming the above PO intake, penetration and aspiraiton were not observed. Pt reports that he doesn't have issues with swallow but after he swallows "it feels like Kerosene is going down my esophagus." He reports raw sensation. At this time, pt appears appropriate to continue on current diet. Extensive education provided on oral care (regardless of only having 2 teeth). Skilled  ST intervention is not indicated at this time.  SLP Visit Diagnosis Dysphagia, unspecified (R13.10) Attention and concentration deficit following -- Frontal lobe and executive function deficit following -- Impact on safety and function Mild aspiration risk   CHL IP TREATMENT RECOMMENDATION 06/25/2020 Treatment Recommendations No treatment recommended at this time   No flowsheet data found. CHL IP DIET RECOMMENDATION 06/25/2020 SLP Diet Recommendations Regular solids;Thin liquid Liquid Administration via Cup Medication Administration Whole meds with liquid Compensations Minimize environmental distractions;Slow rate;Small sips/bites Postural Changes Seated upright at 90 degrees   CHL IP OTHER RECOMMENDATIONS 06/25/2020 Recommended Consults -- Oral Care Recommendations Oral care BID Other Recommendations --   CHL IP FOLLOW UP RECOMMENDATIONS 06/25/2020 Follow up Recommendations None   No flowsheet data found.     CHL IP ORAL PHASE 06/25/2020 Oral Phase WFL Oral - Pudding Teaspoon -- Oral - Pudding Cup -- Oral  - Honey Teaspoon -- Oral - Honey Cup -- Oral - Nectar Teaspoon -- Oral - Nectar Cup -- Oral - Nectar Straw -- Oral - Thin Teaspoon -- Oral - Thin Cup -- Oral - Thin Straw -- Oral - Puree -- Oral - Mech Soft -- Oral - Regular -- Oral - Multi-Consistency -- Oral - Pill -- Oral Phase - Comment --  CHL IP PHARYNGEAL PHASE 06/25/2020 Pharyngeal Phase WFL Pharyngeal- Pudding Teaspoon -- Pharyngeal -- Pharyngeal- Pudding Cup -- Pharyngeal -- Pharyngeal- Honey Teaspoon -- Pharyngeal -- Pharyngeal- Honey Cup -- Pharyngeal -- Pharyngeal- Nectar Teaspoon -- Pharyngeal -- Pharyngeal- Nectar Cup -- Pharyngeal -- Pharyngeal- Nectar Straw -- Pharyngeal -- Pharyngeal- Thin Teaspoon -- Pharyngeal -- Pharyngeal- Thin Cup -- Pharyngeal -- Pharyngeal- Thin Straw -- Pharyngeal -- Pharyngeal- Puree -- Pharyngeal -- Pharyngeal- Mechanical Soft -- Pharyngeal -- Pharyngeal- Regular -- Pharyngeal -- Pharyngeal- Multi-consistency -- Pharyngeal -- Pharyngeal- Pill -- Pharyngeal -- Pharyngeal Comment --  CHL IP CERVICAL ESOPHAGEAL PHASE 06/25/2020 Cervical Esophageal Phase WFL Pudding Teaspoon -- Pudding Cup -- Honey Teaspoon -- Honey Cup -- Nectar Teaspoon -- Nectar Cup -- Nectar Straw -- Thin Teaspoon -- Thin Cup -- Thin Straw -- Puree -- Mechanical Soft -- Regular -- Multi-consistency -- Pill -- Cervical Esophageal Comment -- Mark [name] 06/25/2020, 1:00 PM                 ASSESSMENT/PLAN   Left lung necrortizing pneumonia with abscess -patient with poor dentition possible anaerobic etiology -will send off sputum cultures patient states he can cough phlegm -he is altered with encephalopathy -sputum culture x 3->>>> +GPCs  -AFB sputum-negatve -histo/strep pneumo/legionella urine ag -blood cultures-neg to date -MRSA nasal PCR -procalcitonin trend -defer antimicrobial regimen to ID - currently on Unasyn>>rocephin +flagyl +vancomycin>>>Unasyn  -poor prognosis - palliative evaluation  -follow up in clinic 2wks - [facility]  pulmonary   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)  - chronic hypoxemia with increased O2 requirement  -his emphysema is beyond repair and patient has very poor prognosis -typical COPD carepath with duoneb, bronchopulmonary hygiene, steroids and antibiotics is appropriate for inpatient therapy   Altered mental status with confusion -improved  - possible septic encephalitis, patient admits to drinking alcohol  - VBG does not support hypercarbic encephalopathy  -patient does not lack capacity and has had psychiatric evaluation    Lower extermity foot wounds  - wound care  And podiatry evaluation - possible  Cellulitis/intertrigo    GERD with aspiration  lidocaine malox GI cocktail q6hprn -carafate and protonix -sp Barium study with speech and swallow -  No  worrisome findings    Maculopapular rash   - likely from antimicrobials  - this has been changed.    -will start decadron today   Thank you for allowing me to participate in the care of this patient.   Patient/Family are satisfied with care plan and all questions have been answered.  This document was prepared using Dragon voice recognition software and may include unintentional dictation errors.     Ottie Glazier, M.D.  Division of [place]

## 2020-07-03 NOTE — Progress Notes (Signed)
[place] received page for pt.'s rm. phone, but when [facility] called pt. was not at all aware of having paged [facility].  CH remains available as needed.

## 2020-07-03 NOTE — Progress Notes (Signed)
ID Pt says he is still in pain and feeling weak He does not want to be discharged to rehab today Patient Vitals for the past 24 hrs:  BP Temp Temp src Pulse Resp SpO2  07/03/20 0754 117/78 97.8 F (36.6 C) Oral (!) 108 17 97 %  07/02/20 2323 105/67 97.9 F (36.6 C) Oral (!) 107 18 99 %    O/e awake and alert Poor dentition Chest b/l air entry- crepts b/l Skin- erythematous maculopapular rash over back, chest and legs better Abd soft  Labs CBC Latest Ref Rng & Units 07/01/2020 06/30/2020 06/29/2020  WBC 4.0 - 10.5 K/uL 14.0(H) 12.2(H) 16.0(H)  Hemoglobin 13.0 - 17.0 g/dL 10.4(L) 11.7(L) 11.4(L)  Hematocrit 39 - 52 % 32.6(L) 38.2(L) 37.1(L)  Platelets 150 - 400 K/uL 587(H) 701(H) 627(H)    CMP Latest Ref Rng & Units 07/01/2020 06/30/2020 06/29/2020  Glucose 70 - 99 mg/dL 146(H) 98 171(H)  BUN 8 - 23 mg/dL 5(L) 5(L) 7(L)  Creatinine 0.61 - 1.24 mg/dL 0.64 0.66 0.73  Sodium 135 - 145 mmol/L 135 135 133(L)  Potassium 3.5 - 5.1 mmol/L 3.5 3.4(L) 3.5  Chloride 98 - 111 mmol/L 95(L) 90(L) 93(L)  CO2 22 - 32 mmol/L 32 36(H) 34(H)  Calcium 8.9 - 10.3 mg/dL 8.0(L) 8.3(L) 7.9(L)  Total Protein 6.5 - 8.1 g/dL - - -  Total Bilirubin 0.3 - 1.2 mg/dL - - -  Alkaline Phos 38 - 126 U/L - - -  AST 15 - 41 U/L - - -  ALT 0 - 44 U/L - - -    Micro 9/26 sputum culture- rare candida 9/17 sputum culture ng 9/16 BC ng Beta D glucan neg Histoplasma galactomannin neg HIV NR MRSA PCR NR procal 0.87>0.17  Imaging Consolidative opacity in the left mid lung to apex with some questionable cavitation peripherally  CT lung on 06/18/20 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 Consider necrotizing pneumonia as multiple areas are suspicious for cavitation, and especially a fluid-level lateral to the left hilum (series 2, image 68) strongly suggesting Lung Abscess.  Impression/recommendation Maculopapular rash- likely drug allergy- betalactam- likely culprit- was on unasyn  and then  amox/clav He thinks it is due to the wipes which is unlikely augmentin- changed to ceftriaxone + flagyl on 07/01/20-on 9/3021changed ceftriaxone to Doxy until the rash settles. Then we can switch to Keflex  Chronic Necrotizing pneumonia left lung- being treated like aspiration pneumonia. He ideally needs bronchoscopy ut as per pulmonologist not a candidate because of severe COPD . Also wbc improving and clincally improving.  If no improvement with current antibiotics in 2-3 weeks  will need Bronch He is currently on DOXY + flagyl- I will see him in 2 weeks  and if rash has resolved completely will change antibiotics- will also need repeat imaging ifno response will need bronch  COPD- management as per primary team   Discussed with patient  He needs aggressive PT  Got Moderna COVID vaccine on 06/30/20 Needs 2nd dose on 07/28/20 He needs flu vaccine  Follow up with me as OP on 07/21/20 Discussed the management with patient and hospitalist

## 2020-07-03 NOTE — Care Management Important Message (Signed)
Important Message  Patient Details  Name: Amelio Brosky MRN: [id number] Date of Birth: [birth year]   Medicare Important Message Given:  Yes     Dannette Barbara 07/03/2020, 11:12 AM

## 2020-07-03 NOTE — Progress Notes (Signed)
Nurse into pts room to swab for flu A&B and covid to prepare for d/c to SNF.  Allowed nurse to swab his noes without issue.  Pt states "I am not leaving today" when nurse asked him to explain why he wasn't leaving he refused to "get into it" with this nurse and stated "just give me my papers and you'll see I won't be leaving today".  MD made aware and SW has been informed also for his want to appeal.  Awaiting to hear back from SW at this time.

## 2020-07-04 ENCOUNTER — Inpatient Hospital Stay: Payer: Medicare Other

## 2020-07-04 DIAGNOSIS — F4322 Adjustment disorder with anxiety: Secondary | ICD-10-CM | POA: Diagnosis present

## 2020-07-04 LAB — BLOOD GAS, ARTERIAL
Acid-Base Excess: 13.1 mmol/L — ABNORMAL HIGH (ref 0.0–2.0)
Bicarbonate: 41.6 mmol/L — ABNORMAL HIGH (ref 20.0–28.0)
FIO2: 0.36
O2 Saturation: 45.6 %
Patient temperature: 37
pCO2 arterial: 72 mmHg (ref 32.0–48.0)
pH, Arterial: 7.37 (ref 7.350–7.450)
pO2, Arterial: <31 mmHg — CL (ref 83.0–108.0)

## 2020-07-04 MED ORDER — IBUPROFEN 400 MG PO TABS
400.0000 mg | ORAL_TABLET | Freq: Three times a day (TID) | ORAL | Status: DC | PRN
  Administered 2020-07-05 – 2020-07-08 (×5): 400 mg via ORAL
  Filled 2020-07-04 (×5): qty 1

## 2020-07-04 MED ORDER — IBUPROFEN 400 MG PO TABS: 400.0000 mg | ORAL_TABLET | Freq: Three times a day (TID) | ORAL | 0 refills | Status: DC | PRN

## 2020-07-04 NOTE — Progress Notes (Signed)
CRITICAL VALUE ALERT  Critical Value:  ABG ph 7.37, CO2 72, 02 45.6, bicarb 41.6  Date & Time Notied:  10/2 3235  Provider Notified:W[place]  Orders Received/Actions taken: orders received

## 2020-07-04 NOTE — Progress Notes (Signed)
Patient ID: Mark [name], male   DOB: Jun 14, 1951, 69 y.o.   MRN: [id number]  PCO2 up at 72 Bipap at night to blow off co2  I turned down oxygen to 4 L earlier (was on 5 L) Stopped trazadone  Dr Loletha Grayer

## 2020-07-04 NOTE — Progress Notes (Addendum)
Patient ID: Mark [name], male   DOB: 07-29-1951, 69 y.o.   MRN: [id number] [facility] PROGRESS NOTE  Mark [name] [name] DOB: September 30, 1951 DOA: 06/18/2020 PCP: Sandra Cockayne, MD  HPI/Subjective: Patient appealing discharge from yesterday.  Patient asking for pain medications every 4 hours.  Patient feels a little more lethargic yesterday but he slept last night.  He thinks it may be the trazodone.  Patient requested a chest x-ray and blood work.  Came in with shortness of breath and pneumonia.  Objective: Vitals:   07/04/20 0026 07/04/20 0819  BP: 125/74 128/70  Pulse: (!) 108 98  Resp: 18 18  Temp: 98.2 F (36.8 C) 97.8 F (36.6 C)  SpO2: 98% 98%    Intake/Output Summary (Last 24 hours) at 07/04/2020 1429 Last data filed at 07/04/2020 0539 Gross per 24 hour  Intake 70.82 ml  Output 600 ml  Net -529.18 ml   Filed Weights   06/18/20 2006  Weight: 81.6 kg    ROS: Review of Systems  Constitutional: Positive for malaise/fatigue.  Respiratory: Positive for shortness of breath.    Exam: Physical Exam HENT:     Mouth/Throat:     Pharynx: No oropharyngeal exudate.  Eyes:     General: Lids are normal.     Conjunctiva/sclera: Conjunctivae normal.  Cardiovascular:     Rate and Rhythm: Normal rate and regular rhythm.     Heart sounds: Normal heart sounds, S1 normal and S2 normal.  Pulmonary:     Breath sounds: Examination of the right-lower field reveals decreased breath sounds. Examination of the left-lower field reveals decreased breath sounds. Decreased breath sounds present. No wheezing, rhonchi or rales.  Abdominal:     Palpations: Abdomen is soft.     Tenderness: There is no abdominal tenderness.  Musculoskeletal:     Right foot: No swelling.     Left foot: No swelling.  Skin:    General: Skin is warm.     Comments: Maculopapular rash chest and abdomen and legs. Right forearm fading area of erythema.  Neurological:     Mental Status: He is  alert.  Psychiatric:     Comments: Easily goes off on tangents.       Data Reviewed: Basic Metabolic Panel: Recent Labs  Lab 06/28/20 0327 06/29/20 0508 06/30/20 0421 07/01/20 0617  NA 137 133* 135 135  K 4.0 3.5 3.4* 3.5  CL 95* 93* 90* 95*  CO2 34* 34* 36* 32  GLUCOSE 132* 171* 98 146*  BUN 9 7* 5* 5*  CREATININE 0.66 0.73 0.66 0.64  CALCIUM 7.9* 7.9* 8.3* 8.0*   CBC: Recent Labs  Lab 06/28/20 0327 06/29/20 0508 06/30/20 0421 07/01/20 0617  WBC 14.3* 16.0* 12.2* 14.0*  HGB 11.1* 11.4* 11.7* 10.4*  HCT 36.3* 37.1* 38.2* 32.6*  MCV 95.0 93.0 92.9 91.8  PLT 608* 627* 701* 587*   BNP (last 3 results) Recent Labs    06/18/20 2014  BNP 238.2*     Recent Results (from the past 240 hour(s))  Expectorated sputum assessment w rflx to resp cult     Status: None   Collection Time: 06/28/20  1:10 AM   Specimen: Expectorated Sputum  Result Value Ref Range Status   Specimen Description EXPECTORATED SPUTUM  Final   Special Requests NONE  Final   Sputum evaluation   Final    THIS SPECIMEN IS ACCEPTABLE FOR SPUTUM CULTURE Performed at [facility], [address], [place], [place] [address]  Report Status 06/28/2020 FINAL  Final  Culture, respiratory     Status: None   Collection Time: 06/28/20  1:10 AM  Result Value Ref Range Status   Specimen Description   Final    EXPECTORATED SPUTUM Performed at [facility], [address], [place], [place] [address]    Special Requests   Final    NONE Reflexed from [id number] Performed at [facility], [address] [place], [place] [address]    Gram Stain   Final    RARE WBC PRESENT, PREDOMINANTLY PMN NO ORGANISMS SEEN Performed at [facility], [place] [address], [place], [place] [address]    Culture RARE CANDIDA ALBICANS  Final   Report Status 06/30/2020 FINAL  Final  Respiratory Panel by RT PCR (Flu A&B, Covid) - Nasopharyngeal Swab     Status: None   Collection Time: 07/03/20  10:18 AM   Specimen: Nasopharyngeal Swab  Result Value Ref Range Status   SARS Coronavirus 2 by RT PCR NEGATIVE NEGATIVE Final    Comment: (NOTE) SARS-CoV-2 target nucleic acids are NOT DETECTED.  The SARS-CoV-2 RNA is generally detectable in upper respiratoy specimens during the acute phase of infection. The lowest concentration of SARS-CoV-2 viral copies this assay can detect is 131 copies/mL. A negative result does not preclude SARS-Cov-2 infection and should not be used as the sole basis for treatment or other patient management decisions. A negative result may occur with  improper specimen collection/handling, submission of specimen other than nasopharyngeal swab, presence of viral mutation(s) within the areas targeted by this assay, and inadequate number of viral copies (<131 copies/mL). A negative result must be combined with clinical observations, patient history, and epidemiological information. The expected result is Negative.  Fact Sheet for Patients:  PinkCheek.be  Fact Sheet for Healthcare Providers:  GravelBags.it  This test is no t yet approved or cleared by the [place] FDA and  has been authorized for detection and/or diagnosis of SARS-CoV-2 by FDA under an Emergency Use Authorization (EUA). This EUA will remain  in effect (meaning this test can be used) for the duration of the COVID-19 declaration under Section 564(b)(1) of the Act, 21 U.S.C. section 360bbb-3(b)(1), unless the authorization is terminated or revoked sooner.     Influenza A by PCR NEGATIVE NEGATIVE Final   Influenza B by PCR NEGATIVE NEGATIVE Final    Comment: (NOTE) The Xpert Xpress SARS-CoV-2/FLU/RSV assay is intended as an aid in  the diagnosis of influenza from Nasopharyngeal swab specimens and  should not be used as a sole basis for treatment. Nasal washings and  aspirates are unacceptable for Xpert Xpress SARS-CoV-2/FLU/RSV   testing.  Fact Sheet for Patients: PinkCheek.be  Fact Sheet for Healthcare Providers: GravelBags.it  This test is not yet approved or cleared by the [place] FDA and  has been authorized for detection and/or diagnosis of SARS-CoV-2 by  FDA under an Emergency Use Authorization (EUA). This EUA will remain  in effect (meaning this test can be used) for the duration of the  Covid-19 declaration under Section 564(b)(1) of the Act, 21  U.S.C. section 360bbb-3(b)(1), unless the authorization is  terminated or revoked. Performed at [facility], [address] [place], [place] [address]       Scheduled Meds: . aspirin EC  81 mg Oral Daily  . clonazePAM  1 mg Oral BID  . doxycycline  100 mg Oral Q12H  . enoxaparin (LOVENOX) injection  40 mg Subcutaneous Q24H  .  fluticasone furoate-vilanterol  1 puff Inhalation Daily  . guaiFENesin  1,200 mg Oral BID  . hydrocerin   Topical BID  . ipratropium  2 puff Inhalation Q4H  . levothyroxine  25 mcg Oral QAC breakfast  . lidocaine  15 mL Oral Once  . metroNIDAZOLE  500 mg Oral Q8H  . multivitamin with minerals  1 tablet Oral Daily  . nystatin   Topical TID  . pantoprazole  40 mg Oral Daily  . senna-docusate  1 tablet Oral BID  . sucralfate  1 g Oral TID WC & HS  . triamcinolone cream   Topical BID  . umeclidinium bromide  1 puff Inhalation Daily   Continuous Infusions: . sodium chloride Stopped (07/03/20 1037)    Assessment/Plan:  1. Necrotizing pneumonia.  Infectious disease doctor flipped the antibiotics over to doxycycline and Flagyl orally.  The patient would be given a month supply of Flagyl and a 2-week supply of doxycycline.  Dr. Steva Ready will follow up in the office in 10 days and decide whether or not to keep the doxycycline or switch over to Keflex.  We will have to follow-up with pulmonary for repeat CT scan as outpatient.  Patient will receive a  minimum of 4 weeks of antibiotics.  Patient request a chest x-ray and blood work which I did order. 2. Maculopapular rash secondary to beta lactams.  Patient was on Unasyn and then switched over to Augmentin.  Rash still present.  Still having some itching as needed Benadryl.  Continue oral antibiotics doxycycline and Flagyl. 3. Erythema warmth from prior IV site on right forearm.  This continues to improve daily.  Doxycycline would cover this. 4. COPD with chronic hypoxic respiratory failure.  When I came in the room his oxygen was up to 5 L I did decrease down to 4 L.  We will get an ABG. 5. Anxiety on Klonopin.  We will get a psychiatric evaluation.  Patient very tangential in his answers. 6. Fungal infection groin.  Continue nystatin powder 7. Hypothyroidism unspecified on levothyroxine. 8. Discontinue trazodone which he thinks is the cause of his sleepiness today.     Code Status:     Code Status Orders  (From admission, onward)         Start     Ordered   06/22/20 1409  Do not attempt resuscitation (DNR)  Continuous       Question Answer Comment  In the event of cardiac or respiratory ARREST Do not call a "code blue"   In the event of cardiac or respiratory ARREST Do not perform Intubation, CPR, defibrillation or ACLS   In the event of cardiac or respiratory ARREST Use medication by any route, position, wound care, and other measures to relive pain and suffering. May use oxygen, suction and manual treatment of airway obstruction as needed for comfort.   Comments MOST form on chart.      06/22/20 1409        Code Status History    Date Active Date Inactive Code Status Order ID Comments User Context   06/18/2020 2325 06/22/2020 1409 Full Code [id number]  Lenore Cordia, MD ED   01/18/2019 0010 01/18/2019 2118 Full Code [id number]  Lance Coon, MD Inpatient   05/01/2016 0934 05/03/2016 1455 Full Code [id number]  Harrie Foreman, MD Inpatient   Advance Care Planning Activity      Disposition Plan: Status is: Inpatient  Dispo: The patient is from: Home  Anticipated d/c is to: Rehab              Anticipated d/c date is: Discharged yesterday but is appealing discharge.              Patient currently can continue medical treatment as outpatient at rehab facility.  Patient needs to get stronger and ambulate.  Time spent: 35 minutes.  In the room with nursing staff.  [place]  [facility] [employer]

## 2020-07-04 NOTE — Consult Note (Signed)
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 male patient admitted with COPD issues.  HPI:  Patient seen and evaluated in person by this provider for "tangential processes".  He is expansive on assessment with some anxiety.  Thought processes are circumstantial, arriving at the answer after irrelevant information focused on his health issues, past and present.  Adamant that he does not want Trazodone that he was given last night as he feels he "lost most of his day" r/t being drowsy, order was cancelled.  Does not feel he needs anything for mental health concerns.  No suicidal/homicidal ideations, hallucinations, and substance use.  Writes down information regarding his health and providers who evaluate him.    Past Psychiatric History: anxiety  Risk to Self:   Risk to Others:   Prior Inpatient Therapy:   Prior Outpatient Therapy:    Past Medical History:  Past Medical History:  Diagnosis Date  . Acute respiratory failure with hypoxia and hypercapnia ([place]) 05/01/2016  . Asthma   . COPD (chronic obstructive pulmonary disease) ([place])   . Osteoporosis     Past Surgical History:  Procedure Laterality Date  . BACK SURGERY     Family History:  Family History  Problem Relation Age of Onset  .  Pulmonary fibrosis Mother    Family Psychiatric  History: none Social History:  Social History   Substance and Sexual Activity  Alcohol Use No     Social History   Substance and Sexual Activity  Drug Use No    Social History   Socioeconomic History  . Marital status: Single    Spouse name: NA  . Number of children: 0  . Years of education: 34  . Highest education level: Bachelor's degree (e.g., BA, AB, BS)  Occupational History  . Not on file  Tobacco Use  . Smoking status: Former Research scientist (life sciences)  . Smokeless tobacco: Never Used  Substance and Sexual Activity  . Alcohol use: No  . Drug use: No  . Sexual activity: Not Currently  Other Topics Concern  . Not on file  Social History Narrative  . Not on file   Social Determinants of Health   Financial Resource Strain:   . Difficulty of Paying Living Expenses: Not on file  Food Insecurity:   . Worried About Charity fundraiser in the Last Year: Not on file  . Ran Out of Food in the Last Year: Not on file  Transportation Needs:   . Lack of Transportation (Medical): Not on file  . Lack of Transportation (Non-Medical): Not on file  Physical Activity:   . Days of Exercise per Week: Not on file  . Minutes of Exercise per Session: Not on file  Stress:   . Feeling of Stress : Not on file  Social Connections:   . Frequency of Communication with Friends  and Family: Not on file  . Frequency of Social Gatherings with Friends and Family: Not on file  . Attends Religious Services: Not on file  . Active Member of Clubs or Organizations: Not on file  . Attends Archivist Meetings: Not on file  . Marital Status: Not on file   Additional Social History:    Allergies:   Allergies  Allergen Reactions  . Augmentin [Amoxicillin-Pot Clavulanate] Rash    Maculopapular rash after receiving several days of amoxicillin/clavulanate despite tolerating week of ampicillin/sulbactam    Labs:  Results for orders placed or performed  during the hospital encounter of 06/18/20 (from the past 48 hour(s))  Respiratory Panel by RT PCR (Flu A&B, Covid) - Nasopharyngeal Swab     Status: None   Collection Time: 07/03/20 10:18 AM   Specimen: Nasopharyngeal Swab  Result Value Ref Range   SARS Coronavirus 2 by RT PCR NEGATIVE NEGATIVE    Comment: (NOTE) SARS-CoV-2 target nucleic acids are NOT DETECTED.  The SARS-CoV-2 RNA is generally detectable in upper respiratoy specimens during the acute phase of infection. The lowest concentration of SARS-CoV-2 viral copies this assay can detect is 131 copies/mL. A negative result does not preclude SARS-Cov-2 infection and should not be used as the sole basis for treatment or other patient management decisions. A negative result may occur with  improper specimen collection/handling, submission of specimen other than nasopharyngeal swab, presence of viral mutation(s) within the areas targeted by this assay, and inadequate number of viral copies (<131 copies/mL). A negative result must be combined with clinical observations, patient history, and epidemiological information. The expected result is Negative.  Fact Sheet for Patients:  PinkCheek.be  Fact Sheet for Healthcare Providers:  GravelBags.it  This test is no t yet approved or cleared by the [place] FDA and  has been authorized for detection and/or diagnosis of SARS-CoV-2 by FDA under an Emergency Use Authorization (EUA). This EUA will remain  in effect (meaning this test can be used) for the duration of the COVID-19 declaration under Section 564(b)(1) of the Act, 21 U.S.C. section 360bbb-3(b)(1), unless the authorization is terminated or revoked sooner.     Influenza A by PCR NEGATIVE NEGATIVE   Influenza B by PCR NEGATIVE NEGATIVE    Comment: (NOTE) The Xpert Xpress SARS-CoV-2/FLU/RSV assay is intended as an aid in  the diagnosis of influenza from  Nasopharyngeal swab specimens and  should not be used as a sole basis for treatment. Nasal washings and  aspirates are unacceptable for Xpert Xpress SARS-CoV-2/FLU/RSV  testing.  Fact Sheet for Patients: PinkCheek.be  Fact Sheet for Healthcare Providers: GravelBags.it  This test is not yet approved or cleared by the [place] FDA and  has been authorized for detection and/or diagnosis of SARS-CoV-2 by  FDA under an Emergency Use Authorization (EUA). This EUA will remain  in effect (meaning this test can be used) for the duration of the  Covid-19 declaration under Section 564(b)(1) of the Act, 21  U.S.C. section 360bbb-3(b)(1), unless the authorization is  terminated or revoked. Performed at [facility], [address] [place], [place] [address]   Blood gas, arterial     Status: Abnormal   Collection Time: 07/04/20  2:24 PM  Result Value Ref Range   FIO2 0.36    Delivery systems NASAL CANNULA    pH, Arterial 7.37 7.35 - 7.45   pCO2 arterial 72 (HH) 32 - 48 mmHg    Comment: CRITICAL RESULT CALLED TO, READ BACK  BY AND VERIFIED WITH: PAM RN AT 1511 ON 07/04/20 BL    pO2, Arterial <31.0 (LL) 83 - 108 mmHg    Comment: CRITICAL RESULT CALLED TO, READ BACK BY AND VERIFIED WITH: PAM RN AT 1511 ON 07/04/20 BL    Bicarbonate 41.6 (H) 20.0 - 28.0 mmol/L   Acid-Base Excess 13.1 (H) 0.0 - 2.0 mmol/L   O2 Saturation 45.6 %   Patient temperature 37.0    Collection site RIGHT RADIAL    Sample type ARTERIAL DRAW    Allens test (pass/fail) PASS PASS    Comment: Performed at [facility], [address], [place], [place] [address]    Current Facility-Administered Medications  Medication Dose Route Frequency Provider Last Rate Last Admin  . 0.9 %  sodium chloride infusion   Intravenous PRN Loletha Grayer, MD   Stopped at 07/03/20 1037  . albuterol (PROVENTIL) (2.5 MG/3ML) 0.083% nebulizer solution 3 mL  3 mL  Inhalation Q4H PRN Lenore Cordia, MD   3 mL at 06/25/20 0152  . alum & mag hydroxide-simeth (MAALOX/MYLANTA) 200-200-20 MG/5ML suspension 30 mL  30 mL Oral Q6H PRN Ottie Glazier, MD       And  . lidocaine (XYLOCAINE) 2 % viscous mouth solution 15 mL  15 mL Oral Once Ottie Glazier, MD      . aspirin EC tablet 81 mg  81 mg Oral Daily Nolberto Hanlon, MD   81 mg at 07/04/20 1003  . chlorpheniramine-HYDROcodone (TUSSIONEX) 10-8 MG/5ML suspension 5 mL  5 mL Oral Q4H PRN Max Sane, MD   5 mL at 07/04/20 1007  . clonazePAM (KLONOPIN) tablet 1 mg  1 mg Oral BID Zada Finders R, MD   1 mg at 07/04/20 1004  . doxycycline (VIBRA-TABS) tablet 100 mg  100 mg Oral Q12H Ravishankar, Joellyn Quails, MD   100 mg at 07/04/20 1004  . enoxaparin (LOVENOX) injection 40 mg  40 mg Subcutaneous Q24H Zada Finders R, MD   40 mg at 07/04/20 1006  . fluticasone (FLONASE) 50 MCG/ACT nasal spray 2 spray  2 spray Each Nare Daily PRN Nolberto Hanlon, MD      . fluticasone furoate-vilanterol (BREO ELLIPTA) 200-25 MCG/INH 1 puff  1 puff Inhalation Daily Nolberto Hanlon, MD   1 puff at 07/04/20 1007  . guaiFENesin (MUCINEX) 12 hr tablet 1,200 mg  1,200 mg Oral BID Zada Finders R, MD   1,200 mg at 07/04/20 1006  . hydrocerin (EUCERIN) cream   Topical BID Loletha Grayer, MD   Given at 07/03/20 2200  . ibuprofen (ADVIL) tablet 400 mg  400 mg Oral Q8H PRN Loletha Grayer, MD      . ipratropium (ATROVENT HFA) inhaler 2 puff  2 puff Inhalation Q4H Wyvonnia Dusky, MD   2 puff at 07/04/20 1227  . levothyroxine (SYNTHROID) tablet 25 mcg  25 mcg Oral QAC breakfast Lenore Cordia, MD   25 mcg at 07/04/20 0606  . metroNIDAZOLE (FLAGYL) tablet 500 mg  500 mg Oral Q8H Tsosie Billing, MD   500 mg at 07/04/20 0606  . multivitamin with minerals tablet 1 tablet  1 tablet Oral Daily Loletha Grayer, MD   1 tablet at 07/04/20 1005  . nystatin (MYCOSTATIN/NYSTOP) topical powder   Topical TID Loletha Grayer, MD   Given at 07/03/20 2200   . oxyCODONE (Oxy IR/ROXICODONE) immediate release tablet 10 mg  10 mg Oral Q6H PRN Loletha Grayer, MD   10 mg at 07/04/20 1003  . pantoprazole (PROTONIX) EC  tablet 40 mg  40 mg Oral Daily Wyvonnia Dusky, MD   40 mg at 07/03/20 7616  . polyethylene glycol (MIRALAX / GLYCOLAX) packet 17 g  17 g Oral Daily PRN Nolberto Hanlon, MD   17 g at 06/22/20 1353  . senna-docusate (Senokot-S) tablet 1 tablet  1 tablet Oral BID Nolberto Hanlon, MD   1 tablet at 07/04/20 1003  . sucralfate (CARAFATE) tablet 1 g  1 g Oral TID WC & HS Aleskerov, Fuad, MD   1 g at 07/04/20 1225  . triamcinolone cream (KENALOG) 0.5 %   Topical BID Loletha Grayer, MD   Given at 07/03/20 2200  . umeclidinium bromide (INCRUSE ELLIPTA) 62.5 MCG/INH 1 puff  1 puff Inhalation Daily Lu Duffel, RPH   1 puff at 07/03/20 1037    Musculoskeletal: Strength & Muscle Tone: decreased Gait & Station: did not witness Patient leans: N/A  Psychiatric Specialty Exam: Physical Exam Vitals and nursing note reviewed.  Constitutional:      Appearance: He is well-developed.  Pulmonary:     Effort: No accessory muscle usage.  Musculoskeletal:     Cervical back: Normal range of motion.  Neurological:     General: No focal deficit present.     Mental Status: He is alert.  Psychiatric:        Attention and Perception: Attention and perception normal.        Mood and Affect: Mood is anxious.        Speech: Speech normal.        Behavior: Behavior normal. Behavior is cooperative.        Thought Content: Thought content normal.        Cognition and Memory: Cognition and memory normal.        Judgment: Judgment normal.     Comments: Circumstantial thought processes     Review of Systems  Psychiatric/Behavioral: The patient is nervous/anxious.   All other systems reviewed and are negative.   Blood pressure 128/70, pulse 98, temperature 97.8 F (36.6 C), temperature source Oral, resp. rate 18, height 6' (1.829 m), weight 81.6  kg, SpO2 98 %.Body mass index is 24.41 kg/m.  General Appearance: Casual  Eye Contact:  Good  Speech:  Normal Rate  Volume:  Normal  Mood:  Anxious  Affect:  Congruent  Thought Process:  Descriptions of Associations: Circumstantial  Orientation:  Full (Time, Place, and Person)  Thought Content:  WDL and Logical  Suicidal Thoughts:  No  Homicidal Thoughts:  No  Memory:  Immediate;   Good Recent;   Good Remote;   Good  Judgement:  Fair  Insight:  Fair  Psychomotor Activity:  Decreased  Concentration:  Concentration: Fair and Attention Span: Fair  Recall:  Good  Fund of Knowledge:  Fair  Language:  Good  Akathisia:  No  Handed:  Right  AIMS (if indicated):     Assets:  Leisure Time Resilience  ADL's:  Impaired  Cognition:  WNL  Sleep:        Treatment Plan Summary: Daily contact with patient to assess and evaluate symptoms and progress in treatment, Medication management and Plan adjustment disorder with anxious mood:  -continue current medications, patient refused any type of additional medications  Disposition: No evidence of imminent risk to self or others at present.   Patient does not meet criteria for psychiatric inpatient admission. Supportive therapy provided about ongoing stressors.  Waylan Boga, NP 07/04/2020 3:33 PM

## 2020-07-04 NOTE — Progress Notes (Signed)
[employer] Appeal   Detailed Notice of Discharge Document Given to [facility]: (P) Yes [employer] ROI Document Created: (P) Yes [employer] appeal documents uploaded to [employer] stite: (P) Yes

## 2020-07-05 DIAGNOSIS — J9622 Acute and chronic respiratory failure with hypercapnia: Secondary | ICD-10-CM

## 2020-07-05 DIAGNOSIS — J9621 Acute and chronic respiratory failure with hypoxia: Secondary | ICD-10-CM

## 2020-07-05 LAB — CBC
HCT: 35.9 % — ABNORMAL LOW (ref 39.0–52.0)
Hemoglobin: 11.4 g/dL — ABNORMAL LOW (ref 13.0–17.0)
MCH: 28.9 pg (ref 26.0–34.0)
MCHC: 31.8 g/dL (ref 30.0–36.0)
MCV: 90.9 fL (ref 80.0–100.0)
Platelets: 598 10*3/uL — ABNORMAL HIGH (ref 150–400)
RBC: 3.95 MIL/uL — ABNORMAL LOW (ref 4.22–5.81)
RDW: 14.2 % (ref 11.5–15.5)
WBC: 15.1 10*3/uL — ABNORMAL HIGH (ref 4.0–10.5)
nRBC: 0 % (ref 0.0–0.2)

## 2020-07-05 LAB — COMPREHENSIVE METABOLIC PANEL
ALT: 14 U/L (ref 0–44)
AST: 23 U/L (ref 15–41)
Albumin: 2.6 g/dL — ABNORMAL LOW (ref 3.5–5.0)
Alkaline Phosphatase: 66 U/L (ref 38–126)
Anion gap: 8 (ref 5–15)
BUN: 9 mg/dL (ref 8–23)
CO2: 34 mmol/L — ABNORMAL HIGH (ref 22–32)
Calcium: 8.4 mg/dL — ABNORMAL LOW (ref 8.9–10.3)
Chloride: 97 mmol/L — ABNORMAL LOW (ref 98–111)
Creatinine, Ser: 0.71 mg/dL (ref 0.61–1.24)
GFR calc Af Amer: >60 mL/min (ref >60)
GFR calc non Af Amer: >60 mL/min (ref >60)
Glucose, Bld: 107 mg/dL — ABNORMAL HIGH (ref 70–99)
Potassium: 3.5 mmol/L (ref 3.5–5.1)
Sodium: 139 mmol/L (ref 135–145)
Total Bilirubin: 0.4 mg/dL (ref 0.3–1.2)
Total Protein: 6 g/dL — ABNORMAL LOW (ref 6.5–8.1)

## 2020-07-05 NOTE — Progress Notes (Signed)
Orders for Bipap QHS. Arrived to patient's room. Explained order. Patient declined at this time, stating that he would be receiving a full body bath at 1am. Will attempt to check back at later time if RT is available.

## 2020-07-05 NOTE — Progress Notes (Signed)
Pt asked to please eat his dinner before 10:30pm so Respiratory can place him on his bipap for bedtime.

## 2020-07-05 NOTE — Progress Notes (Signed)
Called by RN to apply Bipap for the night, as this was my second attempt to place unit on patient. Arrived to find patient eating a large plate of food in bed. Explained to patient and RN that due to aspiration risk, the Bipap could not be applied at this time. Will attempt at a later time if RT is available.

## 2020-07-05 NOTE — Progress Notes (Signed)
Respiratory called again to place pt on bipap machine. Pt stating he needs to eat first before he can be placed on bipap. Respiratory to check back again later. Pt educated on the importance of the machine.

## 2020-07-05 NOTE — Progress Notes (Signed)
Patient ID: Mark [name], male   DOB: 07/07/1951, 69 y.o.   MRN: [id number] [facility] PROGRESS NOTE  Mark [name] OBS:[id number] DOB: [birth year] DOA: 06/18/2020 PCP: Sandra Cockayne, MD  HPI/Subjective: The patient stated he was eating at 1 in the morning and did not use the BiPAP last night.  I advised him the BiPAP is to blow off some of the carbon dioxide and will help out with his feeling tired. Patient's rash is still present.  Patient still thinks sleepiness is secondary to trazodone.  Objective: Vitals:   07/05/20 0006 07/05/20 0810  BP: 136/80 116/62  Pulse: (!) 109 (!) 105  Resp: 20 14  Temp: 98.5 F (36.9 C) 98.1 F (36.7 C)  SpO2: 97% 97%    Intake/Output Summary (Last 24 hours) at 07/05/2020 1403 Last data filed at 07/05/2020 1030 Gross per 24 hour  Intake 120 ml  Output 600 ml  Net -480 ml   Filed Weights   06/18/20 2006  Weight: 81.6 kg    ROS: Review of Systems  Respiratory: Positive for shortness of breath.    Exam: Physical Exam HENT:     Mouth/Throat:     Pharynx: No oropharyngeal exudate.  Eyes:     General: Lids are normal.     Conjunctiva/sclera: Conjunctivae normal.     Pupils: Pupils are equal, round, and reactive to light.  Cardiovascular:     Rate and Rhythm: Normal rate and regular rhythm.     Heart sounds: Normal heart sounds, S1 normal and S2 normal.  Pulmonary:     Breath sounds: Examination of the right-lower field reveals decreased breath sounds. Examination of the left-lower field reveals decreased breath sounds. Decreased breath sounds present. No wheezing, rhonchi or rales.  Abdominal:     Palpations: Abdomen is soft.     Tenderness: There is no abdominal tenderness.  Musculoskeletal:     Right ankle: No swelling.     Left ankle: No swelling.  Skin:    General: Skin is warm.     Comments: Blanching maculopapular rash chest and back. Right arm fading cellulitis.  Neurological:     Mental Status: He is alert and  oriented to person, place, and time.       Data Reviewed: Basic Metabolic Panel: Recent Labs  Lab 06/29/20 0508 06/30/20 0421 07/01/20 0617 07/05/20 0549  NA 133* 135 135 139  K 3.5 3.4* 3.5 3.5  CL 93* 90* 95* 97*  CO2 34* 36* 32 34*  GLUCOSE 171* 98 146* 107*  BUN 7* 5* 5* 9  CREATININE 0.73 0.66 0.64 0.71  CALCIUM 7.9* 8.3* 8.0* 8.4*   Liver Function Tests: Recent Labs  Lab 07/05/20 0549  AST 23  ALT 14  ALKPHOS 66  BILITOT 0.4  PROT 6.0*  ALBUMIN 2.6*   CBC: Recent Labs  Lab 06/29/20 0508 06/30/20 0421 07/01/20 0617 07/05/20 0549  WBC 16.0* 12.2* 14.0* 15.1*  HGB 11.4* 11.7* 10.4* 11.4*  HCT 37.1* 38.2* 32.6* 35.9*  MCV 93.0 92.9 91.8 90.9  PLT 627* 701* 587* 598*   BNP (last 3 results) Recent Labs    06/18/20 2014  BNP 238.2*     Recent Results (from the past 240 hour(s))  Expectorated sputum assessment w rflx to resp cult     Status: None   Collection Time: 06/28/20  1:10 AM   Specimen: Expectorated Sputum  Result Value Ref Range Status   Specimen Description EXPECTORATED SPUTUM  Final   Special Requests NONE  Final   Sputum evaluation   Final    THIS SPECIMEN IS ACCEPTABLE FOR SPUTUM CULTURE Performed at [facility], [address] [place], [place] [address]    Report Status 06/28/2020 FINAL  Final  Culture, respiratory     Status: None   Collection Time: 06/28/20  1:10 AM  Result Value Ref Range Status   Specimen Description   Final    EXPECTORATED SPUTUM Performed at [facility], [address] [place], [place] [address]    Special Requests   Final    NONE Reflexed from [id number] Performed at [facility], [address] [place], [place] [address]    Gram Stain   Final    RARE WBC PRESENT, PREDOMINANTLY PMN NO ORGANISMS SEEN Performed at [facility], [place] [address], [place], [place] [address]    Culture RARE CANDIDA ALBICANS  Final   Report Status 06/30/2020 FINAL  Final  Respiratory  Panel by RT PCR (Flu A&B, Covid) - Nasopharyngeal Swab     Status: None   Collection Time: 07/03/20 10:18 AM   Specimen: Nasopharyngeal Swab  Result Value Ref Range Status   SARS Coronavirus 2 by RT PCR NEGATIVE NEGATIVE Final    Comment: (NOTE) SARS-CoV-2 target nucleic acids are NOT DETECTED.  The SARS-CoV-2 RNA is generally detectable in upper respiratoy specimens during the acute phase of infection. The lowest concentration of SARS-CoV-2 viral copies this assay can detect is 131 copies/mL. A negative result does not preclude SARS-Cov-2 infection and should not be used as the sole basis for treatment or other patient management decisions. A negative result may occur with  improper specimen collection/handling, submission of specimen other than nasopharyngeal swab, presence of viral mutation(s) within the areas targeted by this assay, and inadequate number of viral copies (<131 copies/mL). A negative result must be combined with clinical observations, patient history, and epidemiological information. The expected result is Negative.  Fact Sheet for Patients:  PinkCheek.be  Fact Sheet for Healthcare Providers:  GravelBags.it  This test is no t yet approved or cleared by the [place] FDA and  has been authorized for detection and/or diagnosis of SARS-CoV-2 by FDA under an Emergency Use Authorization (EUA). This EUA will remain  in effect (meaning this test can be used) for the duration of the COVID-19 declaration under Section 564(b)(1) of the Act, 21 U.S.C. section 360bbb-3(b)(1), unless the authorization is terminated or revoked sooner.     Influenza A by PCR NEGATIVE NEGATIVE Final   Influenza B by PCR NEGATIVE NEGATIVE Final    Comment: (NOTE) The Xpert Xpress SARS-CoV-2/FLU/RSV assay is intended as an aid in  the diagnosis of influenza from Nasopharyngeal swab specimens and  should not be used as a sole basis  for treatment. Nasal washings and  aspirates are unacceptable for Xpert Xpress SARS-CoV-2/FLU/RSV  testing.  Fact Sheet for Patients: PinkCheek.be  Fact Sheet for Healthcare Providers: GravelBags.it  This test is not yet approved or cleared by the [place] FDA and  has been authorized for detection and/or diagnosis of SARS-CoV-2 by  FDA under an Emergency Use Authorization (EUA). This EUA will remain  in effect (meaning this test can be used) for the duration of the  Covid-19 declaration under Section 564(b)(1) of the Act, 21  U.S.C. section 360bbb-3(b)(1), unless the authorization is  terminated or revoked. Performed at [facility], [address] [place], [place] [address]      Studies: [facility] Chest [place] 1 [address] [place] [address]  Result Date: 07/04/2020 CLINICAL DATA:  Shortness of breath. EXAM: PORTABLE CHEST 1 VIEW COMPARISON:  June 26, 2020 FINDINGS: There is marked severity emphysematous lung disease. Stable marked severity infiltrates are seen throughout the left lung. This is most prominent within the left perihilar and left suprahilar regions. Stable left apical pleural thickening is seen. There is no definite evidence of a pleural effusion or pneumothorax. The heart size and mediastinal contours are within normal limits. Multilevel degenerative changes seen throughout the thoracic spine. Radiopaque contrast is seen within the splenic flexure. IMPRESSION: 1. Stable marked severity left upper lobe and left lower lobe multifocal infiltrates. 2. Marked severity emphysematous lung disease. Electronically Signed   By: Virgina Norfolk M.D.   On: 07/04/2020 17:08    Scheduled Meds: . aspirin EC  81 mg Oral Daily  . clonazePAM  1 mg Oral BID  . doxycycline  100 mg Oral Q12H  . enoxaparin (LOVENOX) injection  40 mg Subcutaneous Q24H  . fluticasone furoate-vilanterol  1 puff Inhalation Daily  . guaiFENesin  1,200 mg Oral BID   . hydrocerin   Topical BID  . ipratropium  2 puff Inhalation Q4H  . levothyroxine  25 mcg Oral QAC breakfast  . lidocaine  15 mL Oral Once  . metroNIDAZOLE  500 mg Oral Q8H  . multivitamin with minerals  1 tablet Oral Daily  . nystatin   Topical TID  . pantoprazole  40 mg Oral Daily  . senna-docusate  1 tablet Oral BID  . sucralfate  1 g Oral TID WC & HS  . triamcinolone cream   Topical BID  . umeclidinium bromide  1 puff Inhalation Daily   Continuous Infusions: . sodium chloride Stopped (07/03/20 1037)    Assessment/Plan:  1. Necrotizing pneumonia.  Oral antibiotics as per infectious disease on doxycycline and Flagyl.  The patient will need 4 weeks of antibiotics.  Infectious disease follow-up as outpatient.  Will also need to follow-up with pulmonary for repeat CT scan of the chest as outpatient near the end of antibiotic course. 2. 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  ABG done yesterday because the patient was sleepy with the trazodone.  He was retaining CO2.  I ordered BiPAP at night but patient was eating last night at 1 in the morning.  I advised the importance of using the BiPAP at night.  Patient will need a BiPAP at facility and even at home.  Keep oxygen at 4 L and do not titrate higher.  Rather have the patient more hypoxic.  Looking back at prior ABGs on 06/18/2020 PCO2 was 57 and even back 05/01/2016 PCO2 was 71.  Both those ABGs at that time would qualify him for noninvasive ventilation at night.  Carbon dioxide in his chemistries have also been elevated which usually goes along with CO2 retention. 3. Maculopapular rash secondary to beta lactams.  As needed Benadryl for itching. 4. Right arm cellulitis- improved with oral doxycycline 5. Anxiety on Klonopin.  Appreciate psychiatric evaluation.  Patient still very tangential with his answers. 6. Fungal infection groin.  On nystatin powder 7. Hypothyroidism unspecified.  Continue levothyroxine         Code Status:     Code Status Orders  (From admission, onward)         Start     Ordered   06/22/20 1409  Do not attempt resuscitation (DNR)  Continuous       Question Answer Comment  In the event of cardiac  or respiratory ARREST Do not call a "code blue"   In the event of cardiac or respiratory ARREST Do not perform Intubation, CPR, defibrillation or ACLS   In the event of cardiac or respiratory ARREST Use medication by any route, position, wound care, and other measures to relive pain and suffering. May use oxygen, suction and manual treatment of airway obstruction as needed for comfort.   Comments MOST form on chart.      06/22/20 1409        Code Status History    Date Active Date Inactive Code Status Order ID Comments User Context   06/18/2020 2325 06/22/2020 1409 Full Code [id number]  Lenore Cordia, MD ED   01/18/2019 0010 01/18/2019 2118 Full Code [id number]  Lance Coon, MD Inpatient   05/01/2016 0934 05/03/2016 1455 Full Code [id number]  Harrie Foreman, MD Inpatient   Advance Care Planning Activity     Disposition Plan: Status is: Inpatient  Dispo: The patient is from: Home              Anticipated d/c is to: Rehab              Anticipated d/c date is: Still waiting results of patient's appeal of discharge.              Patient currently can go to rehab when bed available and BiPAP at night.  Antibiotics:  Oral doxycycline  Oral Flagyl  Time spent: 27 minutes  [place]

## 2020-07-05 NOTE — Plan of Care (Signed)

## 2020-07-05 NOTE — Progress Notes (Signed)
Patient ID: Mark [name], male   DOB: [birth year], 69 y.o.   MRN: [id number]  Notified that facility that he is supposed to go to cannot obtain a BiPAP.  Transitional care team able to get a BiPAP but it will not be delivered until tomorrow.  Disposition tomorrow to rehab.  Dr Loletha Grayer

## 2020-07-05 NOTE — TOC Progression Note (Signed)
Transition of Care [facility]) - Progression Note    Patient Details  Name: Biagio Snelson MRN: [id number] Date of Birth: 1951-04-23  Transition [facility] CM/SW Contact  Boris Sharper, LCSW Phone Number: 07/05/2020, 3:13 PM  Clinical Narrative:    Judithann Graves appeal processed and appeal was denied pt has unitl 10/4 @ 12pm to discharge. CSW contacted Tammy at [facility] and they are able to accept him today. CSW made aware by DM that pt will need bipap machine qhs. Tammy notified CSW that they do not provide those machine's.CSW ordered bipap machine from [employer] and will be delivered tomorrow, pt will discharge once DME delivered., MD aware.   Expected Discharge Plan: [place] Barriers to Discharge: Continued Medical Work up  Expected Discharge Plan and Services Expected Discharge Plan: [place]   Discharge Planning Services: CM Consult Post Acute Care Choice: [place] arrangements for the past 2 months: Single Family Home Expected Discharge Date: 07/03/20                         HH Arranged: RN, PT, OT, Nurse's Aide, Social Work [employer] Agency: [place] ([place]) Date [place]: 06/24/20 Time [place]: [phone] Representative spoke with at [place]: [place] ([place]) Interventions    Readmission Risk Interventions No flowsheet data found.

## 2020-07-06 DIAGNOSIS — J189 Pneumonia, unspecified organism: Secondary | ICD-10-CM | POA: Diagnosis not present

## 2020-07-06 DIAGNOSIS — J449 Chronic obstructive pulmonary disease, unspecified: Secondary | ICD-10-CM | POA: Diagnosis not present

## 2020-07-06 LAB — BASIC METABOLIC PANEL
Anion gap: 11 (ref 5–15)
BUN: 8 mg/dL (ref 8–23)
CO2: 33 mmol/L — ABNORMAL HIGH (ref 22–32)
Calcium: 8.5 mg/dL — ABNORMAL LOW (ref 8.9–10.3)
Chloride: 93 mmol/L — ABNORMAL LOW (ref 98–111)
Creatinine, Ser: 0.7 mg/dL (ref 0.61–1.24)
GFR calc Af Amer: >60 mL/min (ref >60)
GFR calc non Af Amer: >60 mL/min (ref >60)
Glucose, Bld: 81 mg/dL (ref 70–99)
Potassium: 3.8 mmol/L (ref 3.5–5.1)
Sodium: 137 mmol/L (ref 135–145)

## 2020-07-06 LAB — CBC
HCT: 38.6 % — ABNORMAL LOW (ref 39.0–52.0)
Hemoglobin: 12.3 g/dL — ABNORMAL LOW (ref 13.0–17.0)
MCH: 29 pg (ref 26.0–34.0)
MCHC: 31.9 g/dL (ref 30.0–36.0)
MCV: 91 fL (ref 80.0–100.0)
Platelets: 525 10*3/uL — ABNORMAL HIGH (ref 150–400)
RBC: 4.24 MIL/uL (ref 4.22–5.81)
RDW: 14.4 % (ref 11.5–15.5)
WBC: 12.8 10*3/uL — ABNORMAL HIGH (ref 4.0–10.5)
nRBC: 0 % (ref 0.0–0.2)

## 2020-07-06 MED ORDER — DOXYCYCLINE HYCLATE 100 MG PO TABS
100.0000 mg | ORAL_TABLET | Freq: Two times a day (BID) | ORAL | 0 refills | Status: DC
Start: 2020-07-06 — End: 2020-08-07

## 2020-07-06 MED ORDER — METRONIDAZOLE 500 MG PO TABS
500.0000 mg | ORAL_TABLET | Freq: Three times a day (TID) | ORAL | 0 refills | Status: DC
Start: 2020-07-06 — End: 2020-08-14

## 2020-07-06 MED ORDER — TRIAMCINOLONE ACETONIDE 0.5 % EX CREA: TOPICAL_CREAM | CUTANEOUS | 0 refills | Status: DC

## 2020-07-06 MED ORDER — DEXAMETHASONE 4 MG PO TABS
4.0000 mg | ORAL_TABLET | Freq: Every day | ORAL | 0 refills | Status: DC
Start: 2020-07-06 — End: 2021-02-01

## 2020-07-06 MED ORDER — ADULT MULTIVITAMIN W/MINERALS CH: 1.0000 | ORAL_TABLET | Freq: Every day | ORAL | 0 refills | Status: AC

## 2020-07-06 MED ORDER — PANTOPRAZOLE SODIUM 40 MG PO TBEC: 40.0000 mg | DELAYED_RELEASE_TABLET | Freq: Every day | ORAL | 0 refills | Status: DC

## 2020-07-06 MED ORDER — DEXAMETHASONE SODIUM PHOSPHATE 4 MG/ML IJ SOLN
4.0000 mg | INTRAMUSCULAR | Status: DC
  Filled 2020-07-06: qty 1

## 2020-07-06 MED ORDER — IBUPROFEN 400 MG PO TABS: 400.0000 mg | ORAL_TABLET | Freq: Three times a day (TID) | ORAL | 0 refills | Status: DC | PRN

## 2020-07-06 MED ORDER — GUAIFENESIN ER 600 MG PO TB12: 1200.0000 mg | ORAL_TABLET | Freq: Two times a day (BID) | ORAL | 0 refills | Status: AC

## 2020-07-06 MED ORDER — DEXAMETHASONE 4 MG PO TABS
4.0000 mg | ORAL_TABLET | Freq: Every day | ORAL | Status: DC
  Administered 2020-07-06: 4 mg via ORAL
  Filled 2020-07-06 (×3): qty 1

## 2020-07-06 MED ORDER — HYDROCERIN EX CREA: 1.0000 "application " | TOPICAL_CREAM | Freq: Two times a day (BID) | CUTANEOUS | 0 refills | Status: AC

## 2020-07-06 MED ORDER — POLYETHYLENE GLYCOL 3350 17 G PO PACK: 17.0000 g | PACK | Freq: Every day | ORAL | 0 refills | Status: AC | PRN

## 2020-07-06 MED ORDER — NYSTATIN 100000 UNIT/GM EX POWD: Freq: Three times a day (TID) | CUTANEOUS | 0 refills | Status: AC

## 2020-07-06 MED ORDER — SUCRALFATE 1 G PO TABS: 1.0000 g | ORAL_TABLET | Freq: Three times a day (TID) | ORAL | 0 refills | Status: DC

## 2020-07-06 NOTE — Progress Notes (Signed)
ID Pt says his skin is itchy No fever Sob  Antibiotic 9/17-9/20 ceftriaxone 9/20-9/28 Ampicillin/sulbactam 9/28-9/29 augmentin 9/29-9/30 ceftriaxone+flagyl 10/1 Doxy + flagyl Patient Vitals for the past 24 hrs:  BP Temp Temp src Pulse Resp SpO2  07/06/20 1622 126/75 98.2 F (36.8 C) Oral (!) 110 20 100 %  07/06/20 1300 -- -- -- -- -- 95 %  07/06/20 0814 122/62 97.9 F (36.6 C) Oral (!) 101 17 93 %  07/06/20 0020 120/60 98.4 F (36.9 C) Oral 87 18 91 %    O/e awake and alert Face erythema and rash improved Rash on his back better But the front of his chest, abdomen and legs covered with maculopapular rash Palms not involved Also mucous membranes of the eye, mouth and genitalia not involved              CBC Latest Ref Rng & Units 07/06/2020 07/05/2020 07/01/2020  WBC 4.0 - 10.5 K/uL 12.8(H) 15.1(H) 14.0(H)  Hemoglobin 13.0 - 17.0 g/dL 12.3(L) 11.4(L) 10.4(L)  Hematocrit 39 - 52 % 38.6(L) 35.9(L) 32.6(L)  Platelets 150 - 400 K/uL 525(H) 598(H) 587(H)    CMP Latest Ref Rng & Units 07/06/2020 07/05/2020 07/01/2020  Glucose 70 - 99 mg/dL 81 107(H) 146(H)  BUN 8 - 23 mg/dL 8 9 5(L)  Creatinine 0.61 - 1.24 mg/dL 0.70 0.71 0.64  Sodium 135 - 145 mmol/L 137 139 135  Potassium 3.5 - 5.1 mmol/L 3.8 3.5 3.5  Chloride 98 - 111 mmol/L 93(L) 97(L) 95(L)  CO2 22 - 32 mmol/L 33(H) 34(H) 32  Calcium 8.9 - 10.3 mg/dL 8.5(L) 8.4(L) 8.0(L)  Total Protein 6.5 - 8.1 g/dL - 6.0(L) -  Total Bilirubin 0.3 - 1.2 mg/dL - 0.4 -  Alkaline Phos 38 - 126 U/L - 66 -  AST 15 - 41 U/L - 23 -  ALT 0 - 44 U/L - 14 -    Impression/recommendation  Rash - type IV secondary to betalactam allergy 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

## 2020-07-06 NOTE — Progress Notes (Signed)
Pulmonary Medicine          Date: 07/06/2020,   MRN# [id number] Mark [name] 31-Aug-1951     AdmissionWeight: 81.6 kg                 CurrentWeight: 81.6 kg   Referring physician: Dr. Manuella Ghazi   CHIEF COMPLAINT:   Necrotizing pneumonia   HISTORY OF PRESENT ILLNESS   Is a pleasant 69 year old male with a history of chronic pain syndrome with opioid-induced constipation, GERD, advanced COPD chronic hypoxemia on 4 L/min supplemental oxygen home O2.  Also has a background history of hypothyroidism, peripheral neuropathy chronic lumbago, reports worsening respiratory status x2 to 3 months with signs and symptoms of acute COPD exacerbation with increased volume and darkening of phlegm on expectoration.  Most recently in the last 2 weeks prior to admission he developed chills diaphoresis and subjective fevers.  In the ER he was found to have leukocytosis, negative COVID-19 test x2, increased O2 requirement from 4 to 5 L/min nasal cannula however venous blood gas was essentially normal BNP and troponin also within reference range.  CT chest shows necrotizing pneumonia with consolidative infiltrate of the left upper and lower lung zones as well as fluid level cystic lesion suggestive of abscess with sorrounding emphysema.  Pulmonary consultation placed for further evaluation and management of necrotizing pneumonia.  He has crusted swollen erythematous feet with intertrigo, he has distended abdomen, poor dentition, and reports broken back. He shares that he has no family and when asked regarding medical proxy he states "im on my own".  Overall he looks chronically ill.    06/20/20- patient had a good night sleep and now is more lucid, hes drinking coffee.  He is speaking rapidly with cicumferential and tangential language. WBC count is with mild trend up.   06/22/20- patient is DNR/DNI after speaking to palliative today - appreciate collaboration. Sputum with GPCs. Patient requests MDI  inhaler we discussed use of Spiriva and Symbicort.  Mild hypokalemia this am, pharmacy consult for repletion. Repeat CXR in am.    06/23/20- patient appears clincally improved this morning.  CXR this am reviewed by me - left lung cavitary pneumonia and superimposed combined pulmonary fibrosis and emphysema. Patient is on 4L/min [place]  06/24/20-patient is similar as yesterday he complains of post prandial substernal chest discomfort, we discussed potential GERD related pain. He has extensive emphysema and will unlikely be a good candidate for bronchoscopy. We will order spirometry to evaluate candidacy for anesthesia.   06/25/20- patient is slightly imrproved this am.  Leukocytosis finally trending down, and he is coughing up more phlegm thickened brown material possibly aspirated food stuffs. He has GERD will order lidocaine GI coctail and carafate.   06/26/20- patient is on 3L/min, he complains of thrush but throat, lingual surface and upper airway are clear. He had barium study yesterday due to complaints of severe dysphagia but that was also unremarkable. He is speaking in full sentences without desaturation and breathing on auscultation is inmproved  Bilateraly.   06/27/20- patient is more conversive, hes saturating >95% on 4L can wean down as able. He is coughing up more brown debris, I will recollect resp culture.  Left and right sided auscultation is imrpoved.   06/29/20- patient is improved, he is on home setting of 3-4L/min.  He is smiling.  He has 4 bananas for lunch, have asked him to pace himself so he does not get constipated or with elelctrolyte derrangement.   06/29/20-  patient is improved he is being discharged today. We discussed post hospitalization follow up. He had COVID19 vaccine in left shoulder today, he denies pain but states "strong onset of fatigue"  06/30/20- patient has developed rash. He is breathing well, there is ronchorous breaths sounds but no wheezing patient states he had a  breathing treatment prior to my arrival. He complains of having a dirty sponge bath with feces all over his body.    07/02/20- rash has improved slightly. Patient seems more alert and he is breathing better. He still has rhonchi bialterally and I suspect his baseline isclose to this.  He should work with PT, they documented that he declined to participate in physical therapy. I have advised patient to please try hard to work with him. Will start patient on decadron due to COPD and rash.   07/03/20- patient is upset today , he is concerned with coersion from medicare.  I discussed his d/c plan with case manager. He does not wish to be discharged but hes stable and is speaking in full sentences with pressures speech.   07/06/20- patient continues to have rash there is no pustules no crusting and no sloughing of skin, have made arrangements for addition of decadron for 10d and will f/u in clinic.    PAST MEDICAL HISTORY   Past Medical History:  Diagnosis Date  . Acute respiratory failure with hypoxia and hypercapnia ([place]) 05/01/2016  . Asthma   . COPD (chronic obstructive pulmonary disease) ([place])   . Osteoporosis      SURGICAL HISTORY   Past Surgical History:  Procedure Laterality Date  . BACK SURGERY       FAMILY HISTORY   Family History  Problem Relation Age of Onset  . Pulmonary fibrosis Mother      SOCIAL HISTORY   Social History   Tobacco Use  . Smoking status: Former Research scientist (life sciences)  . Smokeless tobacco: Never Used  Substance Use Topics  . Alcohol use: No  . Drug use: No     MEDICATIONS    Home Medication:    Current Medication:  Current Facility-Administered Medications:  .  0.9 %  sodium chloride infusion, , Intravenous, PRN, Loletha Grayer, MD, Stopped at 07/03/20 1037 .  albuterol (PROVENTIL) (2.5 MG/3ML) 0.083% nebulizer solution 3 mL, 3 mL, Inhalation, Q4H PRN, Lenore Cordia, MD, 3 mL at 06/25/20 0152 .  alum & mag hydroxide-simeth (MAALOX/MYLANTA)  200-200-20 MG/5ML suspension 30 mL, 30 mL, Oral, Q6H PRN **AND** lidocaine (XYLOCAINE) 2 % viscous mouth solution 15 mL, 15 mL, Oral, Once, Axl Rodino, MD .  aspirin EC tablet 81 mg, 81 mg, Oral, Daily, Kurtis Bushman, Sahar, MD, 81 mg at 07/06/20 0925 .  chlorpheniramine-HYDROcodone (TUSSIONEX) 10-8 MG/5ML suspension 5 mL, 5 mL, Oral, Q4H PRN, Max Sane, MD, 5 mL at 07/06/20 1220 .  clonazePAM (KLONOPIN) tablet 1 mg, 1 mg, Oral, BID, Zada Finders R, MD, 1 mg at 07/05/20 2125 .  enoxaparin (LOVENOX) injection 40 mg, 40 mg, Subcutaneous, Q24H, Zada Finders R, MD, 40 mg at 07/04/20 1006 .  fluticasone (FLONASE) 50 MCG/ACT nasal spray 2 spray, 2 spray, Each Nare, Daily PRN, Kurtis Bushman, Sahar, MD .  fluticasone furoate-vilanterol (BREO ELLIPTA) 200-25 MCG/INH 1 puff, 1 puff, Inhalation, Daily, Nolberto Hanlon, MD, 1 puff at 07/06/20 0927 .  guaiFENesin (MUCINEX) 12 hr tablet 1,200 mg, 1,200 mg, Oral, BID, Zada Finders R, MD, 1,200 mg at 07/06/20 0924 .  hydrocerin (EUCERIN) cream, , Topical, BID, Loletha Grayer, MD, Given at 07/05/20  2126 .  ibuprofen (ADVIL) tablet 400 mg, 400 mg, Oral, Q8H PRN, Loletha Grayer, MD, 400 mg at 07/05/20 2125 .  ipratropium (ATROVENT HFA) inhaler 2 puff, 2 puff, Inhalation, Q4H, Wyvonnia Dusky, MD, 2 puff at 07/06/20 1221 .  levothyroxine (SYNTHROID) tablet 25 mcg, 25 mcg, Oral, QAC breakfast, Lenore Cordia, MD, 25 mcg at 07/06/20 0456 .  multivitamin with minerals tablet 1 tablet, 1 tablet, Oral, Daily, Loletha Grayer, MD, 1 tablet at 07/06/20 0925 .  nystatin (MYCOSTATIN/NYSTOP) topical powder, , Topical, TID, Loletha Grayer, MD, Given at 07/05/20 1626 .  oxyCODONE (Oxy IR/ROXICODONE) immediate release tablet 10 mg, 10 mg, Oral, Q6H PRN, Loletha Grayer, MD, 10 mg at 07/06/20 1220 .  pantoprazole (PROTONIX) EC tablet 40 mg, 40 mg, Oral, Daily, Wyvonnia Dusky, MD, 40 mg at 07/06/20 0924 .  polyethylene glycol (MIRALAX / GLYCOLAX) packet 17 g, 17 g, Oral,  Daily PRN, Nolberto Hanlon, MD, 17 g at 06/22/20 1353 .  senna-docusate (Senokot-S) tablet 1 tablet, 1 tablet, Oral, BID, Amery, Sahar, MD, 1 tablet at 07/04/20 2200 .  sucralfate (CARAFATE) tablet 1 g, 1 g, Oral, TID WC & HS, Lanney Gins, Averiana Clouatre, MD, 1 g at 07/06/20 1220 .  triamcinolone cream (KENALOG) 0.5 %, , Topical, BID, Loletha Grayer, MD, Given at 07/05/20 0919 .  [DISCONTINUED] fluticasone furoate-vilanterol (BREO ELLIPTA) 100-25 MCG/INH 1 puff, 1 puff, Inhalation, Daily, 1 puff at 06/22/20 0856 **AND** umeclidinium bromide (INCRUSE ELLIPTA) 62.5 MCG/INH 1 puff, 1 puff, Inhalation, Daily, Lu Duffel, RPH, 1 puff at 07/06/20 9892    ALLERGIES   Augmentin [amoxicillin-pot clavulanate]     REVIEW OF SYSTEMS    Review of Systems:  Gen:  Denies  fever, sweats, chills weigh loss  HEENT: Denies blurred vision, double vision, ear pain, eye pain, hearing loss, nose bleeds, sore throat Cardiac:  No dizziness, chest pain or heaviness, chest tightness,edema Resp:   Reports SOB Gi: Denies swallowing difficulty, stomach pain, nausea or vomiting, diarrhea, constipation, bowel incontinence Gu:  Denies bladder incontinence, burning urine Ext:   Denies Joint pain, stiffness or swelling Skin: Denies  skin rash, easy bruising or bleeding or hives Endoc:  Denies polyuria, polydipsia , polyphagia or weight change Psych:   Denies depression, insomnia or hallucinations   Other:  All other systems negative   VS: BP 122/62 (BP Location: Right Arm)   Pulse (!) 101   Temp 97.9 F (36.6 C) (Oral)   Resp 17   Ht 6' (1.829 m)   Wt 81.6 kg   SpO2 93%   BMI 24.41 kg/m      PHYSICAL EXAM    GENERAL:NAD, no fevers, chills, no weakness no fatigue HEAD: Normocephalic, atraumatic.  EYES: Pupils equal, round, reactive to light. Extraocular muscles intact. No scleral icterus.  MOUTH: Moist mucosal membrane. Dentition intact. No abscess noted.  EAR, NOSE, THROAT: Clear without exudates. No  external lesions.  NECK: Supple. No thyromegaly. No nodules. No JVD.  PULMONARY:rhonchi bilaterally  CARDIOVASCULAR: S1 and S2. Regular rate and rhythm. No murmurs, rubs, or gallops. No edema. Pedal pulses 2+ bilaterally.  GASTROINTESTINAL: Soft, nontender, nondistended. No masses. Positive bowel sounds. No hepatosplenomegaly.  MUSCULOSKELETAL: No swelling, clubbing, or edema. Range of motion full in all extremities.  NEUROLOGIC: Cranial nerves II through XII are intact. No gross focal neurological deficits. Sensation intact. Reflexes intact.  SKIN: crusted feet with erythemaouts intertrigo. PSYCHIATRIC: Mood, affect within normal limits. The patient is awake, alert and oriented x 3. Insight, judgment intact.  IMAGING    CT Chest W Contrast  Result Date: 06/18/2020 CLINICAL DATA:  69 year old male with shortness of breath, productive cough, pneumonia. On home oxygen. EXAM: CT CHEST WITH CONTRAST TECHNIQUE: Multidetector CT imaging of the chest was performed during intravenous contrast administration. CONTRAST:  50mL OMNIPAQUE IOHEXOL 300 MG/ML  SOLN COMPARISON:  Portable chest earlier today.  Chest CT 01/17/2020. FINDINGS: Cardiovascular: No cardiomegaly or pericardial effusion. Calcified coronary artery and aortic atherosclerosis. The central vascular structures of the mediastinum are enhancing and appear to be patent. Mediastinum/Nodes: Mildly increased in reactive appearing mediastinal lymph nodes compared to 2020, up to 14 mm short axis. Some nodes (right paratracheal on series 2, image 47) have not significantly changed. Additionally, there is evidence of a larger 15 mm short axis left AP window node. Lungs/Pleura: Chronic pulmonary hyperinflation. Extensive chronic emphysema. Previous bullous emphysema in the left upper lung where now there is extensive consolidation with numerous small rounded areas of opacity suspicious for cavitation. There is a prominent fluid level lateral to the  left hilum on series 3, image 65 suspicious for lung abscess. Abnormal interstitial thickening and peribronchial nodularity throughout the left lung. No superimposed pneumothorax. Only trace left pleural fluid. The major airways remain patent. Right lung emphysema and markings appear stable since last year. Upper Abdomen: Negative visible liver, gallbladder, spleen, pancreas, adrenal glands, kidneys, and bowel in the upper abdomen. Musculoskeletal: Chronic compression fractures and ankylosis in the spine, with several levels of previous vertebral body augmentation, appears stable since last year. Occasional chronic rib fractures. No acute osseous abnormality identified. No chest wall abnormality. IMPRESSION: 1. 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 Consider necrotizing pneumonia as multiple areas are suspicious for cavitation, and especially a fluid-level lateral to the left hilum (series 2, image 68) strongly suggesting Lung Abscess. 2. Right lung unaffected.  Only trace left pleural fluid. 3. Reactive appearing mediastinal lymph nodes. 4. Calcified coronary artery and Aortic Atherosclerosis (ICD10-I70.0). Emphysema (ICD10-J43.9). Electronically Signed   By: Genevie Ann M.D.   On: 06/18/2020 22:14   DG Chest Port 1 View  Result Date: 07/04/2020 CLINICAL DATA:  Shortness of breath. EXAM: PORTABLE CHEST 1 VIEW COMPARISON:  June 26, 2020 FINDINGS: There is marked severity emphysematous lung disease. Stable marked severity infiltrates are seen throughout the left lung. This is most prominent within the left perihilar and left suprahilar regions. Stable left apical pleural thickening is seen. There is no definite evidence of a pleural effusion or pneumothorax. The heart size and mediastinal contours are within normal limits. Multilevel degenerative changes seen throughout the thoracic spine. Radiopaque contrast is seen within the splenic flexure. IMPRESSION: 1. Stable marked severity  left upper lobe and left lower lobe multifocal infiltrates. 2. Marked severity emphysematous lung disease. Electronically Signed   By: Virgina Norfolk M.D.   On: 07/04/2020 17:08   DG Chest Port 1 View  Result Date: 06/26/2020 CLINICAL DATA:  Shortness of breath. EXAM: PORTABLE CHEST 1 VIEW COMPARISON:  June 23, 2020. FINDINGS: The heart size and mediastinal contours are within normal limits. No pneumothorax or pleural effusion is noted. Stable diffuse left lung opacities are noted consistent with pneumonia. Stable right basilar opacity is noted concerning for worsening atelectasis or pneumonia. The visualized skeletal structures are unremarkable. IMPRESSION: Stable bilateral lung opacities are noted consistent with multifocal pneumonia. Electronically Signed   By: Marijo Conception M.D.   On: 06/26/2020 08:57   DG Chest Port 1 View  Result Date: 06/23/2020 CLINICAL DATA:  Pneumonia EXAM: PORTABLE CHEST 1 VIEW COMPARISON:  June 18, 2020 chest radiograph and chest CT FINDINGS: Extensive airspace opacity is noted throughout the left upper and left mid lung regions with areas of apparent cavitation. Less discrete infiltrate is noted throughout the remainder of the left lung, stable. There is underlying emphysematous change. Scattered areas of scarring noted on the right without edema or airspace opacity. Heart size and pulmonary vascularity are normal. No adenopathy. No bone lesions. IMPRESSION: Underlying emphysema. Widespread airspace opacity on the left with areas of cavitary pneumonia throughout the left upper and mid lung regions, similar to recent prior studies. Underlying fibrosis elsewhere, most severe left lower lung region. Scattered areas of scarring on the right without edema or airspace opacity. Stable cardiac silhouette. Electronically Signed   By: Lowella Grip III M.D.   On: 06/23/2020 08:37   DG Chest Portable 1 View  Result Date: 06/18/2020 CLINICAL DATA:  Shortness of breath  all summer lung, worsening last week, productive cough, congestion EXAM: PORTABLE CHEST 1 VIEW COMPARISON:  CT 01/17/2019, radiograph 01/17/2019 FINDINGS: There is a background chronic reticular and fibrotic interstitial changes throughout both lungs albeit with new area of superimposed consolidative opacity spanning the left mid lung to apex including an area of potential cavitation seen more peripherally. No discernible pneumothorax or visible effusion. Cardiomediastinal contours are partially obscured by overlying opacity with the visible borders similar in appearance to the comparison studies from prior. No acute osseous or soft tissue abnormality. Degenerative changes are present in the imaged spine and shoulders. Telemetry leads overlie the chest. IMPRESSION: Consolidative opacity in the left mid lung to apex with some questionable cavitation peripherally. Recommend further characterization with CT of the chest with contrast if patient is able to tolerate. Findings superimposed on chronic reticular and fibrotic interstitial changes. These results were called by telephone at the time of interpretation on 06/18/2020 at 8:45 pm to provider [facility] , who verbally acknowledged these results. Electronically Signed   By: Lovena Le M.D.   On: 06/18/2020 20:45   DG Swallowing Func-Speech Pathology  Result Date: 06/25/2020 Objective Swallowing Evaluation: Type of Study: MBS-Modified Barium Swallow Study  Patient Details Name: Shivan Hodes MRN: [id number] Date of Birth: 19-Feb-1951 Today's Date: 06/25/2020 Time: SLP Start Time (ACUTE ONLY): 1287 -SLP Stop Time (ACUTE ONLY): 0905 SLP Time Calculation (min) (ACUTE ONLY): 30 min Past Medical History: Past Medical History: Diagnosis Date . Acute respiratory failure with hypoxia and hypercapnia ([place]) 05/01/2016 . Asthma  . COPD (chronic obstructive pulmonary disease) ([place])  . Osteoporosis  Past Surgical History: Past Surgical History: Procedure Laterality Date .  BACK SURGERY   HPI: Mr Leider is a pleasant 69 year old male with a history of chronic pain syndrome with opioid-induced constipation, GERD, advanced COPD chronic hypoxemia on 4 L/min supplemental oxygen home O2.  Also has a background history of hypothyroidism, peripheral neuropathy chronic lumbago, reports worsening respiratory status x2 to 3 months with signs and symptoms of acute COPD exacerbation with increased volume and darkening of phlegm on expectoration.  CT chest shows necrotizing pneumonia with consolidative infiltrate of the left upper and lower lung zones as well as fluid level cystic lesion suggestive of abscess with sorrounding emphysema.   Subjective: pt pleasant, conversant Assessment / Plan / Recommendation CHL IP CLINICAL IMPRESSIONS 06/25/2020 Clinical Impression Pt demonstrated grossly functional oropharyngeal abilities when consuming puree, solids, whole barium tablet, thin liquids via cup and nectar thick liquids via cup. On imaging pt presents with moderate oral phase deficits  that are c/b decrased bolus management. However after talking with pt, it appears this is an incidental finding as pt reports this to be "the way I have always chewed." When consuming the above PO intake, penetration and aspiraiton were not observed. Pt reports that he doesn't have issues with swallow but after he swallows "it feels like Kerosene is going down my esophagus." He reports raw sensation. At this time, pt appears appropriate to continue on current diet. Extensive education provided on oral care (regardless of only having 2 teeth). Skilled ST intervention is not indicated at this time.  SLP Visit Diagnosis Dysphagia, unspecified (R13.10) Attention and concentration deficit following -- Frontal lobe and executive function deficit following -- Impact on safety and function Mild aspiration risk   CHL IP TREATMENT RECOMMENDATION 06/25/2020 Treatment Recommendations No treatment recommended at this time   No  flowsheet data found. CHL IP DIET RECOMMENDATION 06/25/2020 SLP Diet Recommendations Regular solids;Thin liquid Liquid Administration via Cup Medication Administration Whole meds with liquid Compensations Minimize environmental distractions;Slow rate;Small sips/bites Postural Changes Seated upright at 90 degrees   CHL IP OTHER RECOMMENDATIONS 06/25/2020 Recommended Consults -- Oral Care Recommendations Oral care BID Other Recommendations --   CHL IP FOLLOW UP RECOMMENDATIONS 06/25/2020 Follow up Recommendations None   No flowsheet data found.     CHL IP ORAL PHASE 06/25/2020 Oral Phase WFL Oral - Pudding Teaspoon -- Oral - Pudding Cup -- Oral - Honey Teaspoon -- Oral - Honey Cup -- Oral - Nectar Teaspoon -- Oral - Nectar Cup -- Oral - Nectar Straw -- Oral - Thin Teaspoon -- Oral - Thin Cup -- Oral - Thin Straw -- Oral - Puree -- Oral - Mech Soft -- Oral - Regular -- Oral - Multi-Consistency -- Oral - Pill -- Oral Phase - Comment --  CHL IP PHARYNGEAL PHASE 06/25/2020 Pharyngeal Phase WFL Pharyngeal- Pudding Teaspoon -- Pharyngeal -- Pharyngeal- Pudding Cup -- Pharyngeal -- Pharyngeal- Honey Teaspoon -- Pharyngeal -- Pharyngeal- Honey Cup -- Pharyngeal -- Pharyngeal- Nectar Teaspoon -- Pharyngeal -- Pharyngeal- Nectar Cup -- Pharyngeal -- Pharyngeal- Nectar Straw -- Pharyngeal -- Pharyngeal- Thin Teaspoon -- Pharyngeal -- Pharyngeal- Thin Cup -- Pharyngeal -- Pharyngeal- Thin Straw -- Pharyngeal -- Pharyngeal- Puree -- Pharyngeal -- Pharyngeal- Mechanical Soft -- Pharyngeal -- Pharyngeal- Regular -- Pharyngeal -- Pharyngeal- Multi-consistency -- Pharyngeal -- Pharyngeal- Pill -- Pharyngeal -- Pharyngeal Comment --  CHL IP CERVICAL ESOPHAGEAL PHASE 06/25/2020 Cervical Esophageal Phase WFL Pudding Teaspoon -- Pudding Cup -- Honey Teaspoon -- Honey Cup -- Nectar Teaspoon -- Nectar Cup -- Nectar Straw -- Thin Teaspoon -- Thin Cup -- Thin Straw -- Puree -- Mechanical Soft -- Regular -- Multi-consistency -- Pill -- Cervical  Esophageal Comment -- Happi Overton 06/25/2020, 1:00 PM                 ASSESSMENT/PLAN   Left lung necrortizing pneumonia with abscess -patient with poor dentition possible anaerobic etiology -will send off sputum cultures patient states he can cough phlegm -he is altered with encephalopathy -sputum culture x 3->>>> +GPCs  -AFB sputum-negatve -histo/strep pneumo/legionella urine ag -blood cultures-neg to date -MRSA nasal PCR -procalcitonin trend -defer antimicrobial regimen to ID - currently on Unasyn>>rocephin +flagyl +vancomycin>>>Unasyn  -poor prognosis - palliative evaluation  -follow up in clinic 2wks - [facility]   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)  - chronic hypoxemia with increased O2 requirement  -his emphysema is beyond repair and patient has very poor prognosis -typical COPD carepath with  duoneb, bronchopulmonary hygiene, steroids and antibiotics is appropriate for inpatient therapy   Altered mental status with confusion -improved  - possible septic encephalitis, patient admits to drinking alcohol  - VBG does not support hypercarbic encephalopathy  -patient does not lack capacity and has had psychiatric evaluation    Lower extermity foot wounds  - wound care  And podiatry evaluation - possible  Cellulitis/intertrigo    GERD with aspiration  lidocaine malox GI cocktail q6hprn -carafate and protonix -sp Barium study with speech and swallow -  No worrisome findings    Maculopapular rash   - likely from antimicrobials  - this has been changed.    -will start decadron today   Thank you for allowing me to participate in the care of this patient.   Patient/Family are satisfied with care plan and all questions have been answered.  This document was prepared using Dragon voice recognition software and may include unintentional dictation errors.     Ottie Glazier, M.D.  Division of [place]

## 2020-07-06 NOTE — Progress Notes (Signed)
Patient ID: Mark [name], male   DOB: 1951/06/27, 69 y.o.   MRN: [id number] [facility] PROGRESS NOTE  Mark [name] JQB:[id number] DOB: 07/15/1951 DOA: 06/18/2020 PCP: Sandra Cockayne, MD  HPI/Subjective: Encourage patient to use the BiPAP at night.  Pulmonary and infectious disease saw the patient today and recommend dermatology consultation for rash.  Objective: Vitals:   07/06/20 0020 07/06/20 0814  BP: 120/60 122/62  Pulse: 87 (!) 101  Resp: 18 17  Temp: 98.4 F (36.9 C) 97.9 F (36.6 C)  SpO2: 91% 93%   No intake or output data in the 24 hours ending 07/06/20 1311 Filed Weights   06/18/20 2006  Weight: 81.6 kg    ROS: Review of Systems  Respiratory: Positive for shortness of breath.    Exam: Physical Exam HENT:     Mouth/Throat:     Pharynx: No oropharyngeal exudate.  Eyes:     General: Lids are normal.     Conjunctiva/sclera: Conjunctivae normal.  Cardiovascular:     Rate and Rhythm: Normal rate and regular rhythm.     Heart sounds: Normal heart sounds, S1 normal and S2 normal.  Pulmonary:     Breath sounds: Examination of the right-lower field reveals decreased breath sounds. Examination of the left-lower field reveals decreased breath sounds. Decreased breath sounds present. No wheezing, rhonchi or rales.  Abdominal:     Palpations: Abdomen is soft.     Tenderness: There is no abdominal tenderness.  Musculoskeletal:     Right lower leg: No swelling.     Left lower leg: No swelling.  Skin:    Comments: Maculopapular rash to chest and back.  Redness on legs.  Neurological:     Mental Status: He is alert and oriented to person, place, and time.       Data Reviewed: Basic Metabolic Panel: Recent Labs  Lab 06/30/20 0421 07/01/20 0617 07/05/20 0549 07/06/20 0337  NA 135 135 139 137  K 3.4* 3.5 3.5 3.8  CL 90* 95* 97* 93*  CO2 36* 32 34* 33*  GLUCOSE 98 146* 107* 81  BUN 5* 5* 9 8  CREATININE 0.66 0.64 0.71 0.70  CALCIUM 8.3* 8.0*  8.4* 8.5*   Liver Function Tests: Recent Labs  Lab 07/05/20 0549  AST 23  ALT 14  ALKPHOS 66  BILITOT 0.4  PROT 6.0*  ALBUMIN 2.6*   CBC: Recent Labs  Lab 06/30/20 0421 07/01/20 0617 07/05/20 0549 07/06/20 0337  WBC 12.2* 14.0* 15.1* 12.8*  HGB 11.7* 10.4* 11.4* 12.3*  HCT 38.2* 32.6* 35.9* 38.6*  MCV 92.9 91.8 90.9 91.0  PLT 701* 587* 598* 525*   BNP (last 3 results) Recent Labs    06/18/20 2014  BNP 238.2*      Recent Results (from the past 240 hour(s))  Expectorated sputum assessment w rflx to resp cult     Status: None   Collection Time: 06/28/20  1:10 AM   Specimen: Expectorated Sputum  Result Value Ref Range Status   Specimen Description EXPECTORATED SPUTUM  Final   Special Requests NONE  Final   Sputum evaluation   Final    THIS SPECIMEN IS ACCEPTABLE FOR SPUTUM CULTURE Performed at [facility], [address], [place], [place] [address]    Report Status 06/28/2020 FINAL  Final  Culture, respiratory     Status: None   Collection Time: 06/28/20  1:10 AM  Result Value Ref Range Status   Specimen Description   Final    EXPECTORATED  SPUTUM Performed at [facility], [address] [place], [place] [address]    Special Requests   Final    NONE Reflexed from [id number] Performed at [facility] [address] [place], [place] [address]    Gram Stain   Final    RARE WBC PRESENT, PREDOMINANTLY PMN NO ORGANISMS SEEN Performed at [facility], [place] [address] [place], [place] [address]    Culture RARE CANDIDA ALBICANS  Final   Report Status 06/30/2020 FINAL  Final  Respiratory Panel by RT PCR (Flu A&B, Covid) - Nasopharyngeal Swab     Status: None   Collection Time: 07/03/20 10:18 AM   Specimen: Nasopharyngeal Swab  Result Value Ref Range Status   SARS Coronavirus 2 by RT PCR NEGATIVE NEGATIVE Final    Comment: (NOTE) SARS-CoV-2 target nucleic acids are NOT DETECTED.  The SARS-CoV-2 RNA is generally detectable  in upper respiratoy specimens during the acute phase of infection. The lowest concentration of SARS-CoV-2 viral copies this assay can detect is 131 copies/mL. A negative result does not preclude SARS-Cov-2 infection and should not be used as the sole basis for treatment or other patient management decisions. A negative result may occur with  improper specimen collection/handling, submission of specimen other than nasopharyngeal swab, presence of viral mutation(s) within the areas targeted by this assay, and inadequate number of viral copies (<131 copies/mL). A negative result must be combined with clinical observations, patient history, and epidemiological information. The expected result is Negative.  Fact Sheet for Patients:  PinkCheek.be  Fact Sheet for Healthcare Providers:  GravelBags.it  This test is no t yet approved or cleared by the [place] FDA and  has been authorized for detection and/or diagnosis of SARS-CoV-2 by FDA under an Emergency Use Authorization (EUA). This EUA will remain  in effect (meaning this test can be used) for the duration of the COVID-19 declaration under Section 564(b)(1) of the Act, 21 U.S.C. section 360bbb-3(b)(1), unless the authorization is terminated or revoked sooner.     Influenza A by PCR NEGATIVE NEGATIVE Final   Influenza B by PCR NEGATIVE NEGATIVE Final    Comment: (NOTE) The Xpert Xpress SARS-CoV-2/FLU/RSV assay is intended as an aid in  the diagnosis of influenza from Nasopharyngeal swab specimens and  should not be used as a sole basis for treatment. Nasal washings and  aspirates are unacceptable for Xpert Xpress SARS-CoV-2/FLU/RSV  testing.  Fact Sheet for Patients: PinkCheek.be  Fact Sheet for Healthcare Providers: GravelBags.it  This test is not yet approved or cleared by the [place] FDA and  has been  authorized for detection and/or diagnosis of SARS-CoV-2 by  FDA under an Emergency Use Authorization (EUA). This EUA will remain  in effect (meaning this test can be used) for the duration of the  Covid-19 declaration under Section 564(b)(1) of the Act, 21  U.S.C. section 360bbb-3(b)(1), unless the authorization is  terminated or revoked. Performed at [facility], [address] [place], [place] [address]      Studies: [facility] Chest [place] 1 View  Result Date: 07/04/2020 CLINICAL DATA:  Shortness of breath. EXAM: PORTABLE CHEST 1 VIEW COMPARISON:  June 26, 2020 FINDINGS: There is marked severity emphysematous lung disease. Stable marked severity infiltrates are seen throughout the left lung. This is most prominent within the left perihilar and left suprahilar regions. Stable left apical pleural thickening is seen. There is no definite evidence of a pleural effusion or pneumothorax. The heart size and mediastinal  contours are within normal limits. Multilevel degenerative changes seen throughout the thoracic spine. Radiopaque contrast is seen within the splenic flexure. IMPRESSION: 1. Stable marked severity left upper lobe and left lower lobe multifocal infiltrates. 2. Marked severity emphysematous lung disease. Electronically Signed   By: Virgina Norfolk M.D.   On: 07/04/2020 17:08    Scheduled Meds: . aspirin EC  81 mg Oral Daily  . clonazePAM  1 mg Oral BID  . dexamethasone  4 mg Oral Daily  . enoxaparin (LOVENOX) injection  40 mg Subcutaneous Q24H  . fluticasone furoate-vilanterol  1 puff Inhalation Daily  . guaiFENesin  1,200 mg Oral BID  . hydrocerin   Topical BID  . ipratropium  2 puff Inhalation Q4H  . levothyroxine  25 mcg Oral QAC breakfast  . lidocaine  15 mL Oral Once  . multivitamin with minerals  1 tablet Oral Daily  . nystatin   Topical TID  . pantoprazole  40 mg Oral Daily  . senna-docusate  1 tablet Oral BID  . sucralfate  1 g Oral TID WC & HS  .  triamcinolone cream   Topical BID  . umeclidinium bromide  1 puff Inhalation Daily   Continuous Infusions: . sodium chloride Stopped (07/03/20 1037)    Assessment/Plan:  1. Maculopapular blanching rash likely secondary to beta lactams.  Also had Maderna vaccination on the 28th.  Patient seen by pulmonary and infectious disease and recommended a dermatology consultation.  Trying to reach the dermatology department for consultation.  Pulmonary wanted the patient on Decadron. 2. Necrotizing pneumonia.  Continue doxycycline and Flagyl.  Will need a repeat CT scan of the chest in 4 weeks. 3. 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  ABG showing CO2 retention.  Chronically on 4 L.  BiPAP set up at night. 4. Right arm cellulitis this has improved with doxycycline 5. Anxiety on Klonopin 6. Fungal infection of groin on nystatin powder 7. Hypothyroidism unspecified on levothyroxine 8. GERD on PPI and Carafate     Code Status:     Code Status Orders  (From admission, onward)         Start     Ordered   06/22/20 1409  Do not attempt resuscitation (DNR)  Continuous       Question Answer Comment  In the event of cardiac or respiratory ARREST Do not call a "code blue"   In the event of cardiac or respiratory ARREST Do not perform Intubation, CPR, defibrillation or ACLS   In the event of cardiac or respiratory ARREST Use medication by any route, position, wound care, and other measures to relive pain and suffering. May use oxygen, suction and manual treatment of airway obstruction as needed for comfort.   Comments MOST form on chart.      06/22/20 1409        Code Status History    Date Active Date Inactive Code Status Order ID Comments User Context   06/18/2020 2325 06/22/2020 1409 Full Code [id number]  Lenore Cordia, MD ED   01/18/2019 0010 01/18/2019 2118 Full Code [id number]  Lance Coon, MD Inpatient   05/01/2016 0934 05/03/2016 1455 Full Code [id number]   Harrie Foreman, MD Inpatient   Advance Care Planning Activity     Disposition Plan: Status is: Inpatient Dispo: The patient is from: Home              Anticipated d/c is to: Rehab  Anticipated d/c date is: Canceled today secondary to specialist wanting dermatology consultation prior to decision being made.              Patient currently being followed closely for necrotizing pneumonia and rash.  Consultants:  Infectious disease  Pulmonary  Antibiotics:  Doxycycline  Flagyl  Time spent: 50 minutes total today, and trying to coordinate care with specialist.  Loletha Grayer  [facility]

## 2020-07-06 NOTE — Progress Notes (Signed)
Patient declines bipap at this time. Patient continues to want food at his bedside and in his lap at all hours of the night, and gets angry when you suggest putting it away. Due to risk for aspiration, bipap cannot be applied at this time.

## 2020-07-06 NOTE — Progress Notes (Signed)
Tolerated bipap until 03:30

## 2020-07-06 NOTE — Discharge Summary (Addendum)
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 Acute respiratory failure with hypoxia and hypercapnia ([place]) 05/01/2016  . Asthma   . COPD (chronic obstructive pulmonary disease) ([place])   . Osteoporosis     HOSPITAL COURSE:   1.  Necrotizing pneumonia.  The patient's initially was on IV antibiotics during the hospital course.  The patient had a rash secondary to beta-lactam's.  Antibiotics were then switched over to Rocephin and Flagyl.  Infectious disease doctor would like the patient on doxycycline for at least 2 weeks and then will decide whether to put on Keflex or continue the doxycycline.  Patient will also be on Flagyl for another month.  Will need at least 4 weeks of antibiotics.  Will end up needing a repeat CT scan of the chest as outpatient. 2.  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  ABG showing CO2 retention.  The patient is a candidate for noninvasive ventilation at night.  Transitional care team trying to obtain  noninvasive ventilation.  The patient chronically wears 4 L of oxygen. 3.  Maculopapular rash secondary to beta-lactam's.  As needed Benadryl for itching.  This likely will take weeks and weeks to improve.  Pulmonary wanted the patient on steroids Decadron 4 mg daily for 10 days.  I am not seeing any sloughing off of skin.  Case discussed with infectious disease Dr. Steva Ready and doxycycline and Flagyl would not cause this and okay to continue.  Triamcinolone cream.  Eucerin cream for legs also. 4.  Right arm cellulitis.  This has improved with doxycycline continues to fade. 5.  Anxiety on Klonopin.  Appreciate psychiatric consultation. 6.  Fungal infection groin continue nystatin powder 7.  Hypothyroidism unspecified continue levothyroxine 6.  GERD on PPI and Carafate 9. Moderna vaccine 1st dose 06/30/2020  DISCHARGE CONDITIONS:   Fair  CONSULTS OBTAINED:  Treatment Team:  Ottie Glazier, MD Patrecia Pour, NP Dr. Steva Ready infectious disease DRUG ALLERGIES:   Allergies  Allergen Reactions  . Augmentin [Amoxicillin-Pot Clavulanate] Rash    Maculopapular rash after receiving several days of amoxicillin/clavulanate despite tolerating week of ampicillin/sulbactam    DISCHARGE MEDICATIONS:   Allergies as of 07/06/2020      Reactions   Augmentin [amoxicillin-pot Clavulanate] Rash   Maculopapular rash after receiving several days of amoxicillin/clavulanate despite tolerating week of ampicillin/sulbactam      Medication List    TAKE these medications   albuterol 108 (90 Base) MCG/ACT inhaler Commonly known as: VENTOLIN HFA Inhale 2 puffs into the lungs every 4 (four) hours as needed for wheezing  or shortness of breath.   aspirin 81 MG tablet Take 81 mg by mouth daily.   budesonide-formoterol 160-4.5 MCG/ACT inhaler Commonly known as: SYMBICORT Inhale 2 puffs into the lungs 2 (two) times daily. Notes to patient: Not given this hospitalization   clonazePAM 1 MG  tablet Commonly known as: KLONOPIN Take 1 tablet (1 mg total) by mouth 2 (two) times daily.   cyclobenzaprine 10 MG tablet Commonly known as: FLEXERIL Take 10 mg by mouth 3 (three) times daily as needed for muscle spasms. Notes to patient: Not given this hospitalization   dexamethasone 4 MG tablet Commonly known as: DECADRON Take 1 tablet (4 mg total) by mouth daily.   diphenhydrAMINE 25 mg capsule Commonly known as: BENADRYL Take 25-50 mg by mouth every 6 (six) hours as needed for allergies.   doxycycline 100 MG tablet Commonly known as: VIBRA-TABS Take 1 tablet (100 mg total) by mouth every 12 (twelve) hours.   fluticasone 50 MCG/ACT nasal spray Commonly known as: FLONASE Place 2 sprays into both nostrils daily as needed for allergies.   guaiFENesin 600 MG 12 hr tablet Commonly known as: MUCINEX Take 2 tablets (1,200 mg total) by mouth 2 (two) times daily for 14 days.   hydrocerin Crea Apply 1 application topically 2 (two) times daily.   ibuprofen 400 MG tablet Commonly known as: ADVIL Take 1 tablet (400 mg total) by mouth every 8 (eight) hours as needed for headache or moderate pain.   ipratropium 17 MCG/ACT inhaler Commonly known as: ATROVENT HFA Inhale 2 puffs into the lungs every 6 (six) hours.   levothyroxine 25 MCG tablet Commonly known as: SYNTHROID Take 25 mcg by mouth daily before breakfast.   metroNIDAZOLE 500 MG tablet Commonly known as: FLAGYL Take 1 tablet (500 mg total) by mouth every 8 (eight) hours.   multivitamin with minerals Tabs tablet Take 1 tablet by mouth daily.   nystatin powder Commonly known as: MYCOSTATIN/NYSTOP Apply topically 3 (three) times daily.   Oxycodone HCl 10 MG Tabs Take 1 tablet (10 mg total) by mouth every 6 (six) hours as needed.   pantoprazole 40 MG tablet Commonly known as: PROTONIX Take 1 tablet (40 mg total) by mouth daily.   polyethylene glycol 17 g packet Commonly known as: MIRALAX / GLYCOLAX Take 17 g by  mouth daily as needed for moderate constipation.   Primatene Mist 0.125 MG/ACT Aero Generic drug: EPINEPHrine Inhale 1 Dose into the lungs as needed (allergies).   sucralfate 1 g tablet Commonly known as: CARAFATE Take 1 tablet (1 g total) by mouth 4 (four) times daily -  with meals and at bedtime.   tiotropium 18 MCG inhalation capsule Commonly known as: SPIRIVA Place 18 mcg into inhaler and inhale daily.   Trelegy Ellipta 100-62.5-25 MCG/INH Aepb Generic drug: Fluticasone-Umeclidin-Vilant Inhale 1 puff into the lungs daily. Notes to patient: Not given this hospitalization   triamcinolone cream 0.5 % Commonly known as: KENALOG Apply to rash chest and back bid        DISCHARGE INSTRUCTIONS:   Follow-up Dr. Steva Ready 1 week Follow-up pulmonary 2 weeks Follow-up Dr. Ernst Spell 1 day  If you experience worsening of your admission symptoms, develop shortness of breath, life threatening emergency, suicidal or homicidal thoughts you must seek medical attention immediately by calling 911 or calling your MD immediately  if symptoms less severe.  You Must read complete instructions/literature along with all the possible adverse reactions/side effects for all the Medicines you take and that have been  prescribed to you. Take any new Medicines after you have completely understood and accept all the possible adverse reactions/side effects.   Please note  You were cared for by a hospitalist during your hospital stay. If you have any questions about your discharge medications or the care you received while you were in the hospital after you are discharged, you can call the unit and asked to speak with the hospitalist on call if the hospitalist that took care of you is not available. Once you are discharged, your primary care physician will handle any further medical issues. Please note that NO REFILLS for any discharge medications will be authorized once you are discharged, as it is imperative  that you return to your primary care physician (or establish a relationship with a primary care physician if you do not have one) for your aftercare needs so that they can reassess your need for medications and monitor your lab values.    Today   CHIEF COMPLAINT:   Chief Complaint  Patient presents with  . Shortness of Breath    HISTORY OF PRESENT ILLNESS:  Mark [name]  is a 69 y.o. male came in with shortness of breath and found to have necrotizing pneumonia.   VITAL SIGNS:  Blood pressure 122/62, pulse (!) 101, temperature 97.9 F (36.6 C), temperature source Oral, resp. rate 17, height 6' (1.829 m), weight 81.6 kg, SpO2 93 %.  I/O:   No intake or output data in the 24 hours ending 07/06/20 1228  PHYSICAL EXAMINATION:  GENERAL:  70 y.o.-year-old patient lying in the bed with no acute distress.  EYES: Pupils equal, round, reactive to light and accommodation. No scleral icterus.  HEENT: Head atraumatic, normocephalic. Oropharynx clear.  LUNGS: Normal breath sounds bilaterally, no wheezing, rales,rhonchi or crepitation. No use of accessory muscles of respiration.  CARDIOVASCULAR: S1, S2 normal. No murmurs, rubs, or gallops.  ABDOMEN: Soft, non-tender, non-distended.  EXTREMITIES: No pedal edema.   PSYCHIATRIC: The patient is alert and answers all questions appropriately.  SKIN: Maculopapular rash worse on chest and back.  Seeing some on her arms.  Also has some on the legs.  Right arm cellulitis seems to be improving on a daily basis.  DATA REVIEW:   CBC Recent Labs  Lab 07/06/20 0337  WBC 12.8*  HGB 12.3*  HCT 38.6*  PLT 525*    Chemistries  Recent Labs  Lab 07/05/20 0549 07/05/20 0549 07/06/20 0337  NA 139   < > 137  K 3.5   < > 3.8  CL 97*   < > 93*  CO2 34*   < > 33*  GLUCOSE 107*   < > 81  BUN 9   < > 8  CREATININE 0.71   < > 0.70  CALCIUM 8.4*   < > 8.5*  AST 23  --   --   ALT 14  --   --   ALKPHOS 66  --   --   BILITOT 0.4  --   --    < > =  values in this interval not displayed.    Microbiology Results  Results for orders placed or performed during the hospital encounter of 06/18/20  SARS Coronavirus 2 by RT PCR (hospital order, performed in [facility] hospital lab) Nasopharyngeal Nasopharyngeal Swab     Status: None   Collection Time: 06/18/20  8:53 PM   Specimen: Nasopharyngeal Swab  Result Value Ref Range Status   SARS Coronavirus 2 NEGATIVE NEGATIVE Final  Comment: (NOTE) SARS-CoV-2 target nucleic acids are NOT DETECTED.  The SARS-CoV-2 RNA is generally detectable in upper and lower respiratory specimens during the acute phase of infection. The lowest concentration of SARS-CoV-2 viral copies this assay can detect is 250 copies / mL. A negative result does not preclude SARS-CoV-2 infection and should not be used as the sole basis for treatment or other patient management decisions.  A negative result may occur with improper specimen collection / handling, submission of specimen other than nasopharyngeal swab, presence of viral mutation(s) within the areas targeted by this assay, and inadequate number of viral copies (<250 copies / mL). A negative result must be combined with clinical observations, patient history, and epidemiological information.  Fact Sheet for Patients:   StrictlyIdeas.no  Fact Sheet for Healthcare Providers: BankingDealers.co.za  This test is not yet approved or  cleared by the [place] FDA and has been authorized for detection and/or diagnosis of SARS-CoV-2 by FDA under an Emergency Use Authorization (EUA).  This EUA will remain in effect (meaning this test can be used) for the duration of the COVID-19 declaration under Section 564(b)(1) of the Act, 21 U.S.C. section 360bbb-3(b)(1), unless the authorization is terminated or revoked sooner.  Performed at [facility], [address] [place], [place] [address]   Culture,  blood (routine x 2)     Status: None   Collection Time: 06/18/20  8:54 PM   Specimen: BLOOD  Result Value Ref Range Status   Specimen Description BLOOD RIGHT FA  Final   Special Requests   Final    BOTTLES DRAWN AEROBIC AND ANAEROBIC Blood Culture adequate volume   Culture   Final    NO GROWTH 5 DAYS Performed at [facility], [address], [place], [place] [address]    Report Status 06/23/2020 FINAL  Final  Culture, blood (routine x 2)     Status: None   Collection Time: 06/18/20  8:54 PM   Specimen: BLOOD  Result Value Ref Range Status   Specimen Description BLOOD LEFT FA  Final   Special Requests   Final    BOTTLES DRAWN AEROBIC AND ANAEROBIC Blood Culture adequate volume   Culture   Final    NO GROWTH 5 DAYS Performed at [facility], [address] [place], [place] [address]    Report Status 06/23/2020 FINAL  Final  Culture, sputum-assessment     Status: None   Collection Time: 06/19/20  7:28 AM   Specimen: Sputum  Result Value Ref Range Status   Specimen Description SPUTUM  Final   Special Requests NONE  Final   Sputum evaluation   Final    THIS SPECIMEN IS ACCEPTABLE FOR SPUTUM CULTURE Performed at [facility], [address], [place], [place] [address]    Report Status 06/19/2020 FINAL  Final  Culture, respiratory     Status: None   Collection Time: 06/19/20  7:28 AM   Specimen: SPU  Result Value Ref Range Status   Specimen Description   Final    SPUTUM Performed at [facility], [address], [place], [place] [address]    Special Requests   Final    NONE Reflexed from [phone] [id number] Performed at [facility], [address] [place], [place] [address]    Gram Stain   Final    MODERATE WBC PRESENT,BOTH PMN AND MONONUCLEAR MODERATE GRAM POSITIVE COCCI FEW GRAM VARIABLE ROD RARE YEAST    Culture   Final  FEW Normal respiratory flora-no Staph aureus or Pseudomonas seen Performed at [facility] [address], [place], [place] [address]    Report Status 06/21/2020 FINAL  Final  Surgical pcr screen     Status: None   Collection Time: 06/22/20  3:22 PM   Specimen: Nasal Mucosa; Nasal Swab  Result Value Ref Range Status   MRSA, PCR NEGATIVE NEGATIVE Final   Staphylococcus aureus NEGATIVE NEGATIVE Final    Comment: (NOTE) The Xpert SA Assay (FDA approved for NASAL specimens in patients 36 years of age and older), is one component of a comprehensive surveillance program. It is not intended to diagnose infection nor to guide or monitor treatment. Performed at [facility], [address] [place], [place] [address]   Expectorated sputum assessment w rflx to resp cult     Status: None   Collection Time: 06/28/20  1:10 AM   Specimen: Expectorated Sputum  Result Value Ref Range Status   Specimen Description EXPECTORATED SPUTUM  Final   Special Requests NONE  Final   Sputum evaluation   Final    THIS SPECIMEN IS ACCEPTABLE FOR SPUTUM CULTURE Performed at [facility], [address], [place], [place] [address]    Report Status 06/28/2020 FINAL  Final  Culture, respiratory     Status: None   Collection Time: 06/28/20  1:10 AM  Result Value Ref Range Status   Specimen Description   Final    EXPECTORATED SPUTUM Performed at [facility], [address], [place], [place] [address]    Special Requests   Final    NONE Reflexed from [id number] Performed at [facility], [address] [place], [place] [address]    Gram Stain   Final    RARE WBC PRESENT, PREDOMINANTLY PMN NO ORGANISMS SEEN Performed at [facility], [address], [place], [place] [address]    Culture RARE CANDIDA ALBICANS  Final   Report Status 06/30/2020 FINAL  Final  Respiratory Panel by RT PCR (Flu A&B, Covid) - Nasopharyngeal Swab     Status: None   Collection Time: 07/03/20 10:18 AM   Specimen: Nasopharyngeal Swab  Result Value Ref Range Status   SARS Coronavirus  2 by RT PCR NEGATIVE NEGATIVE Final    Comment: (NOTE) SARS-CoV-2 target nucleic acids are NOT DETECTED.  The SARS-CoV-2 RNA is generally detectable in upper respiratoy specimens during the acute phase of infection. The lowest concentration of SARS-CoV-2 viral copies this assay can detect is 131 copies/mL. A negative result does not preclude SARS-Cov-2 infection and should not be used as the sole basis for treatment or other patient management decisions. A negative result may occur with  improper specimen collection/handling, submission of specimen other than nasopharyngeal swab, presence of viral mutation(s) within the areas targeted by this assay, and inadequate number of viral copies (<131 copies/mL). A negative result must be combined with clinical observations, patient history, and epidemiological information. The expected result is Negative.  Fact Sheet for Patients:  PinkCheek.be  Fact Sheet for Healthcare Providers:  GravelBags.it  This test is no t yet approved or cleared by the [place] FDA and  has been authorized for detection and/or diagnosis of SARS-CoV-2 by FDA under an Emergency Use Authorization (EUA). This EUA will remain  in effect (meaning this test can be used) for the duration of the COVID-19 declaration under Section 564(b)(1) of the Act, 21 U.S.C. section 360bbb-3(b)(1), unless the authorization is terminated or revoked sooner.  Influenza A by PCR NEGATIVE NEGATIVE Final   Influenza B by PCR NEGATIVE NEGATIVE Final    Comment: (NOTE) The Xpert Xpress SARS-CoV-2/FLU/RSV assay is intended as an aid in  the diagnosis of influenza from Nasopharyngeal swab specimens and  should not be used as a sole basis for treatment. Nasal washings and  aspirates are unacceptable for Xpert Xpress SARS-CoV-2/FLU/RSV  testing.  Fact Sheet for Patients: PinkCheek.be  Fact Sheet  for Healthcare Providers: GravelBags.it  This test is not yet approved or cleared by the [place] FDA and  has been authorized for detection and/or diagnosis of SARS-CoV-2 by  FDA under an Emergency Use Authorization (EUA). This EUA will remain  in effect (meaning this test can be used) for the duration of the  Covid-19 declaration under Section 564(b)(1) of the Act, 21  U.S.C. section 360bbb-3(b)(1), unless the authorization is  terminated or revoked. Performed at [facility], [address], [place], [place] [address]     RADIOLOGY:  [facility] Chest Port 1 View  Result Date: 07/04/2020 CLINICAL DATA:  Shortness of breath. EXAM: PORTABLE CHEST 1 VIEW COMPARISON:  June 26, 2020 FINDINGS: There is marked severity emphysematous lung disease. Stable marked severity infiltrates are seen throughout the left lung. This is most prominent within the left perihilar and left suprahilar regions. Stable left apical pleural thickening is seen. There is no definite evidence of a pleural effusion or pneumothorax. The heart size and mediastinal contours are within normal limits. Multilevel degenerative changes seen throughout the thoracic spine. Radiopaque contrast is seen within the splenic flexure. IMPRESSION: 1. Stable marked severity left upper lobe and left lower lobe multifocal infiltrates. 2. Marked severity emphysematous lung disease. Electronically Signed   By: Virgina Norfolk M.D.   On: 07/04/2020 17:08     Management plans discussed with the patient.  CODE STATUS:     Code Status Orders  (From admission, onward)         Start     Ordered   06/22/20 1409  Do not attempt resuscitation (DNR)  Continuous       Question Answer Comment  In the event of cardiac or respiratory ARREST Do not call a "code blue"   In the event of cardiac or respiratory ARREST Do not perform Intubation, CPR, defibrillation or ACLS   In the event of cardiac or  respiratory ARREST Use medication by any route, position, wound care, and other measures to relive pain and suffering. May use oxygen, suction and manual treatment of airway obstruction as needed for comfort.   Comments MOST form on chart.      06/22/20 1409        Code Status History    Date Active Date Inactive Code Status Order ID Comments User Context   06/18/2020 2325 06/22/2020 1409 Full Code [id number]  Lenore Cordia, MD ED   01/18/2019 0010 01/18/2019 2118 Full Code [id number]  Lance Coon, MD Inpatient   05/01/2016 0934 05/03/2016 1455 Full Code [id number]  Harrie Foreman, MD Inpatient   Advance Care Planning Activity      TOTAL TIME TAKING CARE OF THIS PATIENT: 35 minutes.    Loletha Grayer M.D on 07/06/2020 at 12:28 PM  Between 7am to 6pm - Pager - [phone]  After 6pm go to [email or web address] - password [email or web address]  [facility]  CC: Primary care physician; Colford, Delcie Roch, MD

## 2020-07-07 ENCOUNTER — Encounter: Payer: Self-pay | Admitting: Dermatology

## 2020-07-07 ENCOUNTER — Ambulatory Visit (HOSPITAL_COMMUNITY): Payer: Medicare Other | Admitting: Dermatology

## 2020-07-07 DIAGNOSIS — R21 Rash and other nonspecific skin eruption: Secondary | ICD-10-CM | POA: Diagnosis not present

## 2020-07-07 DIAGNOSIS — J189 Pneumonia, unspecified organism: Secondary | ICD-10-CM | POA: Diagnosis not present

## 2020-07-07 DIAGNOSIS — L539 Erythematous condition, unspecified: Secondary | ICD-10-CM

## 2020-07-07 DIAGNOSIS — J9621 Acute and chronic respiratory failure with hypoxia: Secondary | ICD-10-CM | POA: Diagnosis not present

## 2020-07-07 DIAGNOSIS — J850 Gangrene and necrosis of lung: Secondary | ICD-10-CM | POA: Diagnosis not present

## 2020-07-07 DIAGNOSIS — L03113 Cellulitis of right upper limb: Secondary | ICD-10-CM | POA: Diagnosis not present

## 2020-07-07 DIAGNOSIS — J449 Chronic obstructive pulmonary disease, unspecified: Secondary | ICD-10-CM | POA: Diagnosis not present

## 2020-07-07 LAB — CBC WITH DIFFERENTIAL/PLATELET
Abs Immature Granulocytes: 0.05 10*3/uL (ref 0.00–0.07)
Basophils Absolute: 0 10*3/uL (ref 0.0–0.1)
Basophils Relative: 0 %
Eosinophils Absolute: 0 10*3/uL (ref 0.0–0.5)
Eosinophils Relative: 0 %
HCT: 35.7 % — ABNORMAL LOW (ref 39.0–52.0)
Hemoglobin: 11.6 g/dL — ABNORMAL LOW (ref 13.0–17.0)
Immature Granulocytes: 0 %
Lymphocytes Relative: 4 %
Lymphs Abs: 0.5 10*3/uL — ABNORMAL LOW (ref 0.7–4.0)
MCH: 29 pg (ref 26.0–34.0)
MCHC: 32.5 g/dL (ref 30.0–36.0)
MCV: 89.3 fL (ref 80.0–100.0)
Monocytes Absolute: 0.1 10*3/uL (ref 0.1–1.0)
Monocytes Relative: 1 %
Neutro Abs: 11.9 10*3/uL — ABNORMAL HIGH (ref 1.7–7.7)
Neutrophils Relative %: 95 %
Platelets: 530 10*3/uL — ABNORMAL HIGH (ref 150–400)
RBC: 4 MIL/uL — ABNORMAL LOW (ref 4.22–5.81)
RDW: 14.2 % (ref 11.5–15.5)
WBC: 12.5 10*3/uL — ABNORMAL HIGH (ref 4.0–10.5)
nRBC: 0 % (ref 0.0–0.2)

## 2020-07-07 LAB — RPR: RPR Ser Ql: NONREACTIVE

## 2020-07-07 MED ORDER — DOXYCYCLINE HYCLATE 100 MG PO TABS
100.0000 mg | ORAL_TABLET | Freq: Two times a day (BID) | ORAL | Status: DC
  Administered 2020-07-07 – 2020-07-09 (×5): 100 mg via ORAL
  Filled 2020-07-07 (×5): qty 1

## 2020-07-07 MED ORDER — METRONIDAZOLE 500 MG PO TABS
500.0000 mg | ORAL_TABLET | Freq: Three times a day (TID) | ORAL | Status: DC
  Administered 2020-07-07 – 2020-07-09 (×6): 500 mg via ORAL
  Filled 2020-07-07 (×6): qty 1

## 2020-07-07 NOTE — Progress Notes (Signed)
Pulmonary Medicine          Date: 07/07/2020,   MRN# [id number] Mark [name] 1950/11/02     AdmissionWeight: 81.6 kg                 CurrentWeight: 81.6 kg   Referring physician: Dr. Manuella Ghazi   CHIEF COMPLAINT:   Necrotizing pneumonia   HISTORY OF PRESENT ILLNESS   Is a pleasant 69 year old male with a history of chronic pain syndrome with opioid-induced constipation, GERD, advanced COPD chronic hypoxemia on 4 L/min supplemental oxygen home O2.  Also has a background history of hypothyroidism, peripheral neuropathy chronic lumbago, reports worsening respiratory status x2 to 3 months with signs and symptoms of acute COPD exacerbation with increased volume and darkening of phlegm on expectoration.  Most recently in the last 2 weeks prior to admission he developed chills diaphoresis and subjective fevers.  In the ER he was found to have leukocytosis, negative COVID-19 test x2, increased O2 requirement from 4 to 5 L/min nasal cannula however venous blood gas was essentially normal BNP and troponin also within reference range.  CT chest shows necrotizing pneumonia with consolidative infiltrate of the left upper and lower lung zones as well as fluid level cystic lesion suggestive of abscess with sorrounding emphysema.  Pulmonary consultation placed for further evaluation and management of necrotizing pneumonia.  He has crusted swollen erythematous feet with intertrigo, he has distended abdomen, poor dentition, and reports broken back. He shares that he has no family and when asked regarding medical proxy he states "im on my own".  Overall he looks chronically ill.    06/20/20- patient had a good night sleep and now is more lucid, hes drinking coffee.  He is speaking rapidly with cicumferential and tangential language. WBC count is with mild trend up.   06/22/20- patient is DNR/DNI after speaking to palliative today - appreciate collaboration. Sputum with GPCs. Patient requests MDI  inhaler we discussed use of Spiriva and Symbicort.  Mild hypokalemia this am, pharmacy consult for repletion. Repeat CXR in am.    06/23/20- patient appears clincally improved this morning.  CXR this am reviewed by me - left lung cavitary pneumonia and superimposed combined pulmonary fibrosis and emphysema. Patient is on 4L/min [place]  06/24/20-patient is similar as yesterday he complains of post prandial substernal chest discomfort, we discussed potential GERD related pain. He has extensive emphysema and will unlikely be a good candidate for bronchoscopy. We will order spirometry to evaluate candidacy for anesthesia.   06/25/20- patient is slightly imrproved this am.  Leukocytosis finally trending down, and he is coughing up more phlegm thickened brown material possibly aspirated food stuffs. He has GERD will order lidocaine GI coctail and carafate.   06/26/20- patient is on 3L/min, he complains of thrush but throat, lingual surface and upper airway are clear. He had barium study yesterday due to complaints of severe dysphagia but that was also unremarkable. He is speaking in full sentences without desaturation and breathing on auscultation is inmproved  Bilateraly.   06/27/20- patient is more conversive, hes saturating >95% on 4L can wean down as able. He is coughing up more brown debris, I will recollect resp culture.  Left and right sided auscultation is imrpoved.   06/29/20- patient is improved, he is on home setting of 3-4L/min.  He is smiling.  He has 4 bananas for lunch, have asked him to pace himself so he does not get constipated or with elelctrolyte derrangement.   06/29/20-  patient is improved he is being discharged today. We discussed post hospitalization follow up. He had COVID19 vaccine in left shoulder today, he denies pain but states "strong onset of fatigue"  06/30/20- patient has developed rash. He is breathing well, there is ronchorous breaths sounds but no wheezing patient states he had a  breathing treatment prior to my arrival. He complains of having a dirty sponge bath with feces all over his body.    07/02/20- rash has improved slightly. Patient seems more alert and he is breathing better. He still has rhonchi bialterally and I suspect his baseline isclose to this.  He should work with PT, they documented that he declined to participate in physical therapy. I have advised patient to please try hard to work with him. Will start patient on decadron due to COPD and rash.   07/03/20- patient is upset today , he is concerned with coersion from medicare.  I discussed his d/c plan with case manager. He does not wish to be discharged but hes stable and is speaking in full sentences with pressures speech.   07/06/20- patient continues to have rash there is no pustules no crusting and no sloughing of skin, have made arrangements for addition of decadron for 10d and will f/u in clinic.   07/07/20- significant improvement in rash this am, there is plan per ID to re-institute antimicrobials.  Breathing is much improved from initial admit on auscultation.  He states there is still tenderness to touch on skin with rash. Overall this patient has advanced bullous emphysema and he is high pre-test probability for recurrent pneumonia.  Derm on case plan for biopsy. Patient should have psychiatric evaluation on outpatient due to persistent circumferential speech.    PAST MEDICAL HISTORY   Past Medical History:  Diagnosis Date  . Acute respiratory failure with hypoxia and hypercapnia ([place]) 05/01/2016  . Asthma   . COPD (chronic obstructive pulmonary disease) ([place])   . Osteoporosis      SURGICAL HISTORY   Past Surgical History:  Procedure Laterality Date  . BACK SURGERY       FAMILY HISTORY   Family History  Problem Relation Age of Onset  . Pulmonary fibrosis Mother      SOCIAL HISTORY   Social History   Tobacco Use  . Smoking status: Former Research scientist (life sciences)  . Smokeless tobacco: Never Used   Substance Use Topics  . Alcohol use: No  . Drug use: No     MEDICATIONS    Home Medication:    Current Medication:  Current Facility-Administered Medications:  .  0.9 %  sodium chloride infusion, , Intravenous, PRN, Loletha Grayer, MD, Stopped at 07/03/20 1037 .  albuterol (PROVENTIL) (2.5 MG/3ML) 0.083% nebulizer solution 3 mL, 3 mL, Inhalation, Q4H PRN, Lenore Cordia, MD, 3 mL at 06/25/20 0152 .  alum & mag hydroxide-simeth (MAALOX/MYLANTA) 200-200-20 MG/5ML suspension 30 mL, 30 mL, Oral, Q6H PRN **AND** lidocaine (XYLOCAINE) 2 % viscous mouth solution 15 mL, 15 mL, Oral, Once, Aedan Geimer, MD .  aspirin EC tablet 81 mg, 81 mg, Oral, Daily, Kurtis Bushman, Sahar, MD, 81 mg at 07/07/20 0940 .  chlorpheniramine-HYDROcodone (TUSSIONEX) 10-8 MG/5ML suspension 5 mL, 5 mL, Oral, Q4H PRN, Max Sane, MD, 5 mL at 07/07/20 0947 .  clonazePAM (KLONOPIN) tablet 1 mg, 1 mg, Oral, BID, Zada Finders R, MD, 1 mg at 07/06/20 2151 .  doxycycline (VIBRA-TABS) tablet 100 mg, 100 mg, Oral, Q12H, Wieting, Richard, MD, 100 mg at 07/07/20 1014 .  enoxaparin (  LOVENOX) injection 40 mg, 40 mg, Subcutaneous, Q24H, Zada Finders R, MD, 40 mg at 07/04/20 1006 .  fluticasone (FLONASE) 50 MCG/ACT nasal spray 2 spray, 2 spray, Each Nare, Daily PRN, Kurtis Bushman, Sahar, MD .  fluticasone furoate-vilanterol (BREO ELLIPTA) 200-25 MCG/INH 1 puff, 1 puff, Inhalation, Daily, Nolberto Hanlon, MD, 1 puff at 07/07/20 0948 .  guaiFENesin (MUCINEX) 12 hr tablet 1,200 mg, 1,200 mg, Oral, BID, Zada Finders R, MD, 1,200 mg at 07/07/20 0940 .  hydrocerin (EUCERIN) cream, , Topical, BID, Loletha Grayer, MD, Given at 07/07/20 1001 .  ibuprofen (ADVIL) tablet 400 mg, 400 mg, Oral, Q8H PRN, Loletha Grayer, MD, 400 mg at 07/07/20 1022 .  ipratropium (ATROVENT HFA) inhaler 2 puff, 2 puff, Inhalation, Q4H, Wyvonnia Dusky, MD, 2 puff at 07/07/20 0708 .  levothyroxine (SYNTHROID) tablet 25 mcg, 25 mcg, Oral, QAC breakfast, Lenore Cordia, MD, 25 mcg at 07/07/20 0708 .  metroNIDAZOLE (FLAGYL) tablet 500 mg, 500 mg, Oral, Q8H, Wieting, Richard, MD, 500 mg at 07/07/20 1014 .  multivitamin with minerals tablet 1 tablet, 1 tablet, Oral, Daily, Loletha Grayer, MD, 1 tablet at 07/07/20 0940 .  nystatin (MYCOSTATIN/NYSTOP) topical powder, , Topical, TID, Loletha Grayer, MD, Given at 07/07/20 1000 .  oxyCODONE (Oxy IR/ROXICODONE) immediate release tablet 10 mg, 10 mg, Oral, Q6H PRN, Loletha Grayer, MD, 10 mg at 07/07/20 0708 .  pantoprazole (PROTONIX) EC tablet 40 mg, 40 mg, Oral, Daily, Wyvonnia Dusky, MD, 40 mg at 07/07/20 0940 .  polyethylene glycol (MIRALAX / GLYCOLAX) packet 17 g, 17 g, Oral, Daily PRN, Nolberto Hanlon, MD, 17 g at 06/22/20 1353 .  senna-docusate (Senokot-S) tablet 1 tablet, 1 tablet, Oral, BID, Amery, Sahar, MD, 1 tablet at 07/04/20 2200 .  sucralfate (CARAFATE) tablet 1 g, 1 g, Oral, TID WC & HS, Lanney Gins, Arkie Tagliaferro, MD, 1 g at 07/07/20 0940 .  triamcinolone cream (KENALOG) 0.5 %, , Topical, BID, Loletha Grayer, MD, Given at 07/07/20 1000 .  [DISCONTINUED] fluticasone furoate-vilanterol (BREO ELLIPTA) 100-25 MCG/INH 1 puff, 1 puff, Inhalation, Daily, 1 puff at 06/22/20 0856 **AND** umeclidinium bromide (INCRUSE ELLIPTA) 62.5 MCG/INH 1 puff, 1 puff, Inhalation, Daily, Lu Duffel, RPH, 1 puff at 07/07/20 6295    ALLERGIES   Augmentin [amoxicillin-pot clavulanate]     REVIEW OF SYSTEMS    Review of Systems:  Gen:  Denies  fever, sweats, chills weigh loss  HEENT: Denies blurred vision, double vision, ear pain, eye pain, hearing loss, nose bleeds, sore throat Cardiac:  No dizziness, chest pain or heaviness, chest tightness,edema Resp:   Reports SOB Gi: Denies swallowing difficulty, stomach pain, nausea or vomiting, diarrhea, constipation, bowel incontinence Gu:  Denies bladder incontinence, burning urine Ext:   Denies Joint pain, stiffness or swelling Skin: Denies  skin rash, easy bruising  or bleeding or hives Endoc:  Denies polyuria, polydipsia , polyphagia or weight change Psych:   Denies depression, insomnia or hallucinations   Other:  All other systems negative   VS: BP 133/72   Pulse 100   Temp 98 F (36.7 C) (Oral)   Resp 14   Ht 6' (1.829 m)   Wt 81.6 kg   SpO2 96%   BMI 24.41 kg/m      PHYSICAL EXAM    GENERAL:NAD, no fevers, chills, no weakness no fatigue HEAD: Normocephalic, atraumatic.  EYES: Pupils equal, round, reactive to light. Extraocular muscles intact. No scleral icterus.  MOUTH: Moist mucosal membrane. Dentition intact. No abscess noted.  EAR, NOSE,  THROAT: Clear without exudates. No external lesions.  NECK: Supple. No thyromegaly. No nodules. No JVD.  PULMONARY:rhonchi bilaterally  CARDIOVASCULAR: S1 and S2. Regular rate and rhythm. No murmurs, rubs, or gallops. No edema. Pedal pulses 2+ bilaterally.  GASTROINTESTINAL: Soft, nontender, nondistended. No masses. Positive bowel sounds. No hepatosplenomegaly.  MUSCULOSKELETAL: No swelling, clubbing, or edema. Range of motion full in all extremities.  NEUROLOGIC: Cranial nerves II through XII are intact. No gross focal neurological deficits. Sensation intact. Reflexes intact.  SKIN: crusted feet with erythemaouts intertrigo. PSYCHIATRIC: Mood, affect within normal limits. The patient is awake, alert and oriented x 3. Insight, judgment intact.       IMAGING    CT Chest W Contrast  Result Date: 06/18/2020 CLINICAL DATA:  69 year old male with shortness of breath, productive cough, pneumonia. On home oxygen. EXAM: CT CHEST WITH CONTRAST TECHNIQUE: Multidetector CT imaging of the chest was performed during intravenous contrast administration. CONTRAST:  50mL OMNIPAQUE IOHEXOL 300 MG/ML  SOLN COMPARISON:  Portable chest earlier today.  Chest CT 01/17/2020. FINDINGS: Cardiovascular: No cardiomegaly or pericardial effusion. Calcified coronary artery and aortic atherosclerosis. The central vascular  structures of the mediastinum are enhancing and appear to be patent. Mediastinum/Nodes: Mildly increased in reactive appearing mediastinal lymph nodes compared to 2020, up to 14 mm short axis. Some nodes (right paratracheal on series 2, image 47) have not significantly changed. Additionally, there is evidence of a larger 15 mm short axis left AP window node. Lungs/Pleura: Chronic pulmonary hyperinflation. Extensive chronic emphysema. Previous bullous emphysema in the left upper lung where now there is extensive consolidation with numerous small rounded areas of opacity suspicious for cavitation. There is a prominent fluid level lateral to the left hilum on series 3, image 65 suspicious for lung abscess. Abnormal interstitial thickening and peribronchial nodularity throughout the left lung. No superimposed pneumothorax. Only trace left pleural fluid. The major airways remain patent. Right lung emphysema and markings appear stable since last year. Upper Abdomen: Negative visible liver, gallbladder, spleen, pancreas, adrenal glands, kidneys, and bowel in the upper abdomen. Musculoskeletal: Chronic compression fractures and ankylosis in the spine, with several levels of previous vertebral body augmentation, appears stable since last year. Occasional chronic rib fractures. No acute osseous abnormality identified. No chest wall abnormality. IMPRESSION: 1. 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 Consider necrotizing pneumonia as multiple areas are suspicious for cavitation, and especially a fluid-level lateral to the left hilum (series 2, image 68) strongly suggesting Lung Abscess. 2. Right lung unaffected.  Only trace left pleural fluid. 3. Reactive appearing mediastinal lymph nodes. 4. Calcified coronary artery and Aortic Atherosclerosis (ICD10-I70.0). Emphysema (ICD10-J43.9). Electronically Signed   By: Genevie Ann M.D.   On: 06/18/2020 22:14   DG Chest Port 1 View  Result Date:  07/04/2020 CLINICAL DATA:  Shortness of breath. EXAM: PORTABLE CHEST 1 VIEW COMPARISON:  June 26, 2020 FINDINGS: There is marked severity emphysematous lung disease. Stable marked severity infiltrates are seen throughout the left lung. This is most prominent within the left perihilar and left suprahilar regions. Stable left apical pleural thickening is seen. There is no definite evidence of a pleural effusion or pneumothorax. The heart size and mediastinal contours are within normal limits. Multilevel degenerative changes seen throughout the thoracic spine. Radiopaque contrast is seen within the splenic flexure. IMPRESSION: 1. Stable marked severity left upper lobe and left lower lobe multifocal infiltrates. 2. Marked severity emphysematous lung disease. Electronically Signed   By: Virgina Norfolk M.D.   On:  07/04/2020 17:08   DG Chest Port 1 View  Result Date: 06/26/2020 CLINICAL DATA:  Shortness of breath. EXAM: PORTABLE CHEST 1 VIEW COMPARISON:  June 23, 2020. FINDINGS: The heart size and mediastinal contours are within normal limits. No pneumothorax or pleural effusion is noted. Stable diffuse left lung opacities are noted consistent with pneumonia. Stable right basilar opacity is noted concerning for worsening atelectasis or pneumonia. The visualized skeletal structures are unremarkable. IMPRESSION: Stable bilateral lung opacities are noted consistent with multifocal pneumonia. Electronically Signed   By: Marijo Conception M.D.   On: 06/26/2020 08:57   DG Chest Port 1 View  Result Date: 06/23/2020 CLINICAL DATA:  Pneumonia EXAM: PORTABLE CHEST 1 VIEW COMPARISON:  June 18, 2020 chest radiograph and chest CT FINDINGS: Extensive airspace opacity is noted throughout the left upper and left mid lung regions with areas of apparent cavitation. Less discrete infiltrate is noted throughout the remainder of the left lung, stable. There is underlying emphysematous change. Scattered areas of scarring  noted on the right without edema or airspace opacity. Heart size and pulmonary vascularity are normal. No adenopathy. No bone lesions. IMPRESSION: Underlying emphysema. Widespread airspace opacity on the left with areas of cavitary pneumonia throughout the left upper and mid lung regions, similar to recent prior studies. Underlying fibrosis elsewhere, most severe left lower lung region. Scattered areas of scarring on the right without edema or airspace opacity. Stable cardiac silhouette. Electronically Signed   By: Lowella Grip III M.D.   On: 06/23/2020 08:37   DG Chest Portable 1 View  Result Date: 06/18/2020 CLINICAL DATA:  Shortness of breath all summer lung, worsening last week, productive cough, congestion EXAM: PORTABLE CHEST 1 VIEW COMPARISON:  CT 01/17/2019, radiograph 01/17/2019 FINDINGS: There is a background chronic reticular and fibrotic interstitial changes throughout both lungs albeit with new area of superimposed consolidative opacity spanning the left mid lung to apex including an area of potential cavitation seen more peripherally. No discernible pneumothorax or visible effusion. Cardiomediastinal contours are partially obscured by overlying opacity with the visible borders similar in appearance to the comparison studies from prior. No acute osseous or soft tissue abnormality. Degenerative changes are present in the imaged spine and shoulders. Telemetry leads overlie the chest. IMPRESSION: Consolidative opacity in the left mid lung to apex with some questionable cavitation peripherally. Recommend further characterization with CT of the chest with contrast if patient is able to tolerate. Findings superimposed on chronic reticular and fibrotic interstitial changes. These results were called by telephone at the time of interpretation on 06/18/2020 at 8:45 pm to provider [facility] , who verbally acknowledged these results. Electronically Signed   By: Lovena Le M.D.   On: 06/18/2020 20:45    DG Swallowing Func-Speech Pathology  Result Date: 06/25/2020 Objective Swallowing Evaluation: Type of Study: MBS-Modified Barium Swallow Study  Patient Details Name: Shayon Trompeter MRN: [id number] Date of Birth: 23-Nov-1950 Today's Date: 06/25/2020 Time: SLP Start Time (ACUTE ONLY): 1779 -SLP Stop Time (ACUTE ONLY): 0905 SLP Time Calculation (min) (ACUTE ONLY): 30 min Past Medical History: Past Medical History: Diagnosis Date . Acute respiratory failure with hypoxia and hypercapnia ([place]) 05/01/2016 . Asthma  . COPD (chronic obstructive pulmonary disease) ([place])  . Osteoporosis  Past Surgical History: Past Surgical History: Procedure Laterality Date . BACK SURGERY   HPI: Mr Suto is a pleasant 69 year old male with a history of chronic pain syndrome with opioid-induced constipation, GERD, advanced COPD chronic hypoxemia on 4 L/min supplemental oxygen home O2.  Also  has a background history of hypothyroidism, peripheral neuropathy chronic lumbago, reports worsening respiratory status x2 to 3 months with signs and symptoms of acute COPD exacerbation with increased volume and darkening of phlegm on expectoration.  CT chest shows necrotizing pneumonia with consolidative infiltrate of the left upper and lower lung zones as well as fluid level cystic lesion suggestive of abscess with sorrounding emphysema.   Subjective: pt pleasant, conversant Assessment / Plan / Recommendation CHL IP CLINICAL IMPRESSIONS 06/25/2020 Clinical Impression Pt demonstrated grossly functional oropharyngeal abilities when consuming puree, solids, whole barium tablet, thin liquids via cup and nectar thick liquids via cup. On imaging pt presents with moderate oral phase deficits that are c/b decrased bolus management. However after talking with pt, it appears this is an incidental finding as pt reports this to be "the way I have always chewed." When consuming the above PO intake, penetration and aspiraiton were not observed. Pt reports that he  doesn't have issues with swallow but after he swallows "it feels like Kerosene is going down my esophagus." He reports raw sensation. At this time, pt appears appropriate to continue on current diet. Extensive education provided on oral care (regardless of only having 2 teeth). Skilled ST intervention is not indicated at this time.  SLP Visit Diagnosis Dysphagia, unspecified (R13.10) Attention and concentration deficit following -- Frontal lobe and executive function deficit following -- Impact on safety and function Mild aspiration risk   CHL IP TREATMENT RECOMMENDATION 06/25/2020 Treatment Recommendations No treatment recommended at this time   No flowsheet data found. CHL IP DIET RECOMMENDATION 06/25/2020 SLP Diet Recommendations Regular solids;Thin liquid Liquid Administration via Cup Medication Administration Whole meds with liquid Compensations Minimize environmental distractions;Slow rate;Small sips/bites Postural Changes Seated upright at 90 degrees   CHL IP OTHER RECOMMENDATIONS 06/25/2020 Recommended Consults -- Oral Care Recommendations Oral care BID Other Recommendations --   CHL IP FOLLOW UP RECOMMENDATIONS 06/25/2020 Follow up Recommendations None   No flowsheet data found.     CHL IP ORAL PHASE 06/25/2020 Oral Phase WFL Oral - Pudding Teaspoon -- Oral - Pudding Cup -- Oral - Honey Teaspoon -- Oral - Honey Cup -- Oral - Nectar Teaspoon -- Oral - Nectar Cup -- Oral - Nectar Straw -- Oral - Thin Teaspoon -- Oral - Thin Cup -- Oral - Thin Straw -- Oral - Puree -- Oral - Mech Soft -- Oral - Regular -- Oral - Multi-Consistency -- Oral - Pill -- Oral Phase - Comment --  CHL IP PHARYNGEAL PHASE 06/25/2020 Pharyngeal Phase WFL Pharyngeal- Pudding Teaspoon -- Pharyngeal -- Pharyngeal- Pudding Cup -- Pharyngeal -- Pharyngeal- Honey Teaspoon -- Pharyngeal -- Pharyngeal- Honey Cup -- Pharyngeal -- Pharyngeal- Nectar Teaspoon -- Pharyngeal -- Pharyngeal- Nectar Cup -- Pharyngeal -- Pharyngeal- Nectar Straw -- Pharyngeal  -- Pharyngeal- Thin Teaspoon -- Pharyngeal -- Pharyngeal- Thin Cup -- Pharyngeal -- Pharyngeal- Thin Straw -- Pharyngeal -- Pharyngeal- Puree -- Pharyngeal -- Pharyngeal- Mechanical Soft -- Pharyngeal -- Pharyngeal- Regular -- Pharyngeal -- Pharyngeal- Multi-consistency -- Pharyngeal -- Pharyngeal- Pill -- Pharyngeal -- Pharyngeal Comment --  CHL IP CERVICAL ESOPHAGEAL PHASE 06/25/2020 Cervical Esophageal Phase WFL Pudding Teaspoon -- Pudding Cup -- Honey Teaspoon -- Honey Cup -- Nectar Teaspoon -- Nectar Cup -- Nectar Straw -- Thin Teaspoon -- Thin Cup -- Thin Straw -- Puree -- Mechanical Soft -- Regular -- Multi-consistency -- Pill -- Cervical Esophageal Comment -- Happi Overton 06/25/2020, 1:00 PM  ASSESSMENT/PLAN   Left lung necrortizing pneumonia with abscess -patient with poor dentition possible anaerobic etiology -will send off sputum cultures patient states he can cough phlegm -he is altered with encephalopathy -sputum culture x 3->>>> +GPCs  -AFB sputum-negatve -histo/strep pneumo/legionella urine ag -blood cultures-neg to date -MRSA nasal PCR -procalcitonin trend -defer antimicrobial regimen to ID - currently on Unasyn>>rocephin +flagyl +vancomycin>>>Unasyn>>doxy and Flagyl.   -poor prognosis - palliative evaluation  -follow up in clinic 2wks - [facility]   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)  - chronic hypoxemia with increased O2 requirement  -his emphysema is beyond repair and patient has very poor prognosis -typical COPD carepath with duoneb, bronchopulmonary hygiene, steroids and antibiotics is appropriate for inpatient therapy   Altered mental status with confusion -improved  - possible septic encephalitis, patient admits to drinking alcohol  - VBG does not support hypercarbic encephalopathy  -patient does not lack capacity and has had psychiatric evaluation    Lower extermity foot wounds  - wound care  And podiatry  evaluation - possible  Cellulitis/intertrigo    GERD with aspiration  lidocaine malox GI cocktail q6hprn -carafate and protonix -sp Barium study with speech and swallow -  No worrisome findings    Maculopapular rash   - likely from antimicrobials  - this has been changed.    -will start decadron today   Thank you for allowing me to participate in the care of this patient.   Patient/Family are satisfied with care plan and all questions have been answered.  This document was prepared using Dragon voice recognition software and may include unintentional dictation errors.     Ottie Glazier, M.D.  Division of [place]

## 2020-07-07 NOTE — Progress Notes (Signed)
PT Cancellation Note  Patient Details Name: Mark [name] MRN: [id number] DOB: [birth year]   Cancelled Treatment:    Reason Eval/Treat Not Completed: Patient at procedure or test/unavailable (Two attempts to work with patient this date. Pt working with nursing 1st attempt, then later eating his late breakfast. WIll attempt again at later date/time.)  12:42 PM, 07/07/20 [name], PT, DPT Physical Therapist - [facility]  [phone] ([place])    Ailyne Pawley C 07/07/2020, 12:42 PM

## 2020-07-07 NOTE — Progress Notes (Signed)
Mark [name] says he had low grade fever Feeling hot Cough better Rash better  Patient Vitals for the past 24 hrs:  BP Temp Temp src Pulse Resp SpO2  07/07/20 1547 128/70 98 F (36.7 C) -- 98 20 95 %  07/07/20 0801 133/72 98 F (36.7 C) Oral 100 14 96 %  07/07/20 0637 110/65 99.3 F (37.4 C) Oral (!) 112 19 93 %  07/07/20 0242 138/73 99 F (37.2 C) Oral (!) 116 19 90 %  07/07/20 0007 112/66 99.3 F (37.4 C) Oral (!) 118 20 92 %   O/e  Awake and alert Chest b/l air entry Rhonchi, crepts Erythematous Macular rash front  of chest and legs better- less red Cns non focal  Labs CBC Latest Ref Rng & Units 07/07/2020 07/06/2020 07/05/2020  WBC 4.0 - 10.5 K/uL 12.5(H) 12.8(H) 15.1(H)  Hemoglobin 13.0 - 17.0 g/dL 11.6(L) 12.3(L) 11.4(L)  Hematocrit 39 - 52 % 35.7(L) 38.6(L) 35.9(L)  Platelets 150 - 400 K/uL 530(H) 525(H) 598(H)  No eosinophilia  CMP Latest Ref Rng & Units 07/06/2020 07/05/2020 07/01/2020  Glucose 70 - 99 mg/dL 81 107(H) 146(H)  BUN 8 - 23 mg/dL 8 9 5(L)  Creatinine 0.61 - 1.24 mg/dL 0.70 0.71 0.64  Sodium 135 - 145 mmol/L 137 139 135  Potassium 3.5 - 5.1 mmol/L 3.8 3.5 3.5  Chloride 98 - 111 mmol/L 93(L) 97(L) 95(L)  CO2 22 - 32 mmol/L 33(H) 34(H) 32  Calcium 8.9 - 10.3 mg/dL 8.5(L) 8.4(L) 8.0(L)  Total Protein 6.5 - 8.1 g/dL - 6.0(L) -  Total Bilirubin 0.3 - 1.2 mg/dL - 0.4 -  Alkaline Phos 38 - 126 U/L - 66 -  AST 15 - 41 U/L - 23 -  ALT 0 - 44 U/L - 14 -   Antibiotics 9/17-9/20 ceftriaxone 9/20-9/28 Ampicillin/sulbactam 9/28-9/29 augmentin 9/29-9/30 ceftriaxone+flagyl 10/1 Doxy + flagyl  Impression/recommendation  Macular/papular erythrodermic  Rash over the front of the torso, legs ? 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 No SJS Amoxicillin was stopped last week- rash usually gets worse even after stopping the culprit drug and then gets better He was started on  Doxy  07/03/20 last Friday after the rash was noted  Appreciate Derm consult  Necrotizing left sided pneumonia- continue Doxy + flagyl Thrombocytosis due to necrotizing infection  COPD  Discussed the management with patient and care team

## 2020-07-07 NOTE — Progress Notes (Signed)
Patient ID: Mark [name], male   DOB: November 10, 1950, 69 y.o.   MRN: [id number] [facility] PROGRESS NOTE  Mark [name]:[id number] DOB: 1951/08/31 DOA: 06/18/2020 PCP: Sandra Cockayne, MD  HPI/Subjective: Patient still has rash on his chest.  Some itching.  Always has a little shortness of breath.  Did not wear his BiPAP last night.  Had a low-grade temperature.  Objective: Vitals:   07/07/20 0637 07/07/20 0801  BP: 110/65 133/72  Pulse: (!) 112 100  Resp: 19 14  Temp: 99.3 F (37.4 C) 98 F (36.7 C)  SpO2: 93% 96%    Intake/Output Summary (Last 24 hours) at 07/07/2020 1513 Last data filed at 07/07/2020 1000 Gross per 24 hour  Intake --  Output 1300 ml  Net -1300 ml   Filed Weights   06/18/20 2006  Weight: 81.6 kg    ROS: Review of Systems  Respiratory: Positive for shortness of breath.   Cardiovascular: Negative for chest pain.   Exam: Physical Exam HENT:     Mouth/Throat:     Pharynx: No oropharyngeal exudate.  Eyes:     General: Lids are normal.     Conjunctiva/sclera: Conjunctivae normal.  Cardiovascular:     Rate and Rhythm: Normal rate and regular rhythm.     Heart sounds: Normal heart sounds, S1 normal and S2 normal.  Pulmonary:     Breath sounds: Examination of the right-lower field reveals decreased breath sounds. Examination of the left-lower field reveals decreased breath sounds. Decreased breath sounds present. No wheezing, rhonchi or rales.  Abdominal:     Palpations: Abdomen is soft.     Tenderness: There is no abdominal tenderness.  Musculoskeletal:     Right lower leg: No swelling.     Left lower leg: No swelling.  Skin:    General: Skin is warm.     Comments: Deep redness with blanching on his anterior chest. Right back improved from previous Right wrist improved from previous fading erythema.  No warmth. Bilateral legs does have patchy erythema.  Neurological:     Mental Status: He is alert and oriented to person, place, and  time.       Data Reviewed: Basic Metabolic Panel: Recent Labs  Lab 07/01/20 0617 07/05/20 0549 07/06/20 0337  NA 135 139 137  K 3.5 3.5 3.8  CL 95* 97* 93*  CO2 32 34* 33*  GLUCOSE 146* 107* 81  BUN 5* 9 8  CREATININE 0.64 0.71 0.70  CALCIUM 8.0* 8.4* 8.5*   Liver Function Tests: Recent Labs  Lab 07/05/20 0549  AST 23  ALT 14  ALKPHOS 66  BILITOT 0.4  PROT 6.0*  ALBUMIN 2.6*   CBC: Recent Labs  Lab 07/01/20 0617 07/05/20 0549 07/06/20 0337 07/07/20 0323  WBC 14.0* 15.1* 12.8* 12.5*  NEUTROABS  --   --   --  11.9*  HGB 10.4* 11.4* 12.3* 11.6*  HCT 32.6* 35.9* 38.6* 35.7*  MCV 91.8 90.9 91.0 89.3  PLT 587* 598* 525* 530*   BNP (last 3 results) Recent Labs    06/18/20 2014  BNP 238.2*      Recent Results (from the past 240 hour(s))  Expectorated sputum assessment w rflx to resp cult     Status: None   Collection Time: 06/28/20  1:10 AM   Specimen: Expectorated Sputum  Result Value Ref Range Status   Specimen Description EXPECTORATED SPUTUM  Final   Special Requests NONE  Final   Sputum evaluation   Final  THIS SPECIMEN IS ACCEPTABLE FOR SPUTUM CULTURE Performed at [facility], [address] [place], [place] [address]    Report Status 06/28/2020 FINAL  Final  Culture, respiratory     Status: None   Collection Time: 06/28/20  1:10 AM  Result Value Ref Range Status   Specimen Description   Final    EXPECTORATED SPUTUM Performed at [facility], [address] [place], [place] [address]    Special Requests   Final    NONE Reflexed from [id number] Performed at [facility], [address] [place], [place] [address]    Gram Stain   Final    RARE WBC PRESENT, PREDOMINANTLY PMN NO ORGANISMS SEEN Performed at [facility], [place] [address], [place], [place] [address]    Culture RARE CANDIDA ALBICANS  Final   Report Status 06/30/2020 FINAL  Final  Respiratory Panel by RT PCR (Flu A&B, Covid) - Nasopharyngeal  Swab     Status: None   Collection Time: 07/03/20 10:18 AM   Specimen: Nasopharyngeal Swab  Result Value Ref Range Status   SARS Coronavirus 2 by RT PCR NEGATIVE NEGATIVE Final    Comment: (NOTE) SARS-CoV-2 target nucleic acids are NOT DETECTED.  The SARS-CoV-2 RNA is generally detectable in upper respiratoy specimens during the acute phase of infection. The lowest concentration of SARS-CoV-2 viral copies this assay can detect is 131 copies/mL. A negative result does not preclude SARS-Cov-2 infection and should not be used as the sole basis for treatment or other patient management decisions. A negative result may occur with  improper specimen collection/handling, submission of specimen other than nasopharyngeal swab, presence of viral mutation(s) within the areas targeted by this assay, and inadequate number of viral copies (<131 copies/mL). A negative result must be combined with clinical observations, patient history, and epidemiological information. The expected result is Negative.  Fact Sheet for Patients:  PinkCheek.be  Fact Sheet for Healthcare Providers:  GravelBags.it  This test is no t yet approved or cleared by the [place] FDA and  has been authorized for detection and/or diagnosis of SARS-CoV-2 by FDA under an Emergency Use Authorization (EUA). This EUA will remain  in effect (meaning this test can be used) for the duration of the COVID-19 declaration under Section 564(b)(1) of the Act, 21 U.S.C. section 360bbb-3(b)(1), unless the authorization is terminated or revoked sooner.     Influenza A by PCR NEGATIVE NEGATIVE Final   Influenza B by PCR NEGATIVE NEGATIVE Final    Comment: (NOTE) The Xpert Xpress SARS-CoV-2/FLU/RSV assay is intended as an aid in  the diagnosis of influenza from Nasopharyngeal swab specimens and  should not be used as a sole basis for treatment. Nasal washings and  aspirates are  unacceptable for Xpert Xpress SARS-CoV-2/FLU/RSV  testing.  Fact Sheet for Patients: PinkCheek.be  Fact Sheet for Healthcare Providers: GravelBags.it  This test is not yet approved or cleared by the [place] FDA and  has been authorized for detection and/or diagnosis of SARS-CoV-2 by  FDA under an Emergency Use Authorization (EUA). This EUA will remain  in effect (meaning this test can be used) for the duration of the  Covid-19 declaration under Section 564(b)(1) of the Act, 21  U.S.C. section 360bbb-3(b)(1), unless the authorization is  terminated or revoked. Performed at [facility], [address] [place], [place] [address]      Scheduled Meds: . aspirin EC  81 mg Oral Daily  . clonazePAM  1 mg Oral  BID  . doxycycline  100 mg Oral Q12H  . enoxaparin (LOVENOX) injection  40 mg Subcutaneous Q24H  . fluticasone furoate-vilanterol  1 puff Inhalation Daily  . guaiFENesin  1,200 mg Oral BID  . hydrocerin   Topical BID  . ipratropium  2 puff Inhalation Q4H  . levothyroxine  25 mcg Oral QAC breakfast  . lidocaine  15 mL Oral Once  . metroNIDAZOLE  500 mg Oral Q8H  . multivitamin with minerals  1 tablet Oral Daily  . nystatin   Topical TID  . pantoprazole  40 mg Oral Daily  . senna-docusate  1 tablet Oral BID  . sucralfate  1 g Oral TID WC & HS  . triamcinolone cream   Topical BID  . umeclidinium bromide  1 puff Inhalation Daily   Continuous Infusions: . sodium chloride Stopped (07/03/20 1037)    Assessment/Plan:  1. Rash on the chest.  Back rash seems better.  Case discussed with dermatology and they would like to hold steroids for 24 hours before skin biopsy tomorrow.  Could be drug rash reaction versus psoriasis.  I think it is likely beta-lactam related.  (Antibiotics were switched to Flagyl and doxycycline after the rash was already present).  Patient also had a maternal vaccine on  06/30/2020 2. Necrotizing pneumonia.  As per infectious disease continue doxycycline and Flagyl.  The patient had a low-grade fever of 99 today.  Patient will need an outpatient CT scan of the chest as outpatient.  Pulmonary follow-up and infectious disease follow-up as outpatient. 3. 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  PCO2 high on ABG.  Patient qualifies for noninvasive ventilation at night which was set up.  Chronically on 4 L of oxygen.  Patient requested an ABG to check his CO2 retention.  I mentioned that he needs to wear the BiPAP at night in order to make a difference.  Not ordering another ABG at this point, advised to wear the BiPAP at night. 4. Right arm cellulitis improved with doxycycline 5. Anxiety on Klonopin.  Appreciate previous psychiatric consultation 6. Fungal infection of groin on nystatin powder 7. Hypothyroidism unspecified on levothyroxine 8. GERD on PPI and Carafate   Code Status:     Code Status Orders  (From admission, onward)         Start     Ordered   06/22/20 1409  Do not attempt resuscitation (DNR)  Continuous       Question Answer Comment  In the event of cardiac or respiratory ARREST Do not call a "code blue"   In the event of cardiac or respiratory ARREST Do not perform Intubation, CPR, defibrillation or ACLS   In the event of cardiac or respiratory ARREST Use medication by any route, position, wound care, and other measures to relive pain and suffering. May use oxygen, suction and manual treatment of airway obstruction as needed for comfort.   Comments MOST form on chart.      06/22/20 1409        Code Status History    Date Active Date Inactive Code Status Order ID Comments User Context   06/18/2020 2325 06/22/2020 1409 Full Code [id number]  Lenore Cordia, MD ED   01/18/2019 0010 01/18/2019 2118 Full Code [id number]  Lance Coon, MD Inpatient   05/01/2016 0934 05/03/2016 1455 Full Code [id number]  Harrie Foreman, MD  Inpatient   Advance Care Planning Activity     Disposition Plan: Status is: Inpatient  Dispo:  The patient is from: Home              Anticipated d/c is to: Rehab              Anticipated d/c date is: Potentially 07/08/2020 versus 07/09/2020              Patient currently being treated for necrotizing pneumonia on oral antibiotics.  Specialist concerned about rash that worsened on his chest.  Patient will have a skin biopsy tomorrow being off steroids for 24 hours.  Consultants:  Dermatology  Pulmonary  Infectious disease  Antibiotics:  Flagyl  Doxycycline  Time spent: 28 minutes, case discussed with dermatology, ID and pulmonary  [place]  [facility]

## 2020-07-07 NOTE — Progress Notes (Signed)
   07/07/20 0007  Assess: MEWS Score  Temp 99.3 F (37.4 C)  BP 112/66  Pulse Rate (!) 118  Resp 20  SpO2 92 %  Assess: MEWS Score  MEWS Temp 0  MEWS Systolic 0  MEWS Pulse 2  MEWS RR 0  MEWS LOC 0  MEWS Score 2  MEWS Score Color Yellow  Assess: if the MEWS score is Yellow or Red  Were vital signs taken at a resting state? Yes  Focused Assessment Change from prior assessment (see assessment flowsheet)  Early Detection of Sepsis Score *See Row Information* Low  MEWS guidelines implemented *See Row Information* Yes  Treat  Pain Scale 0-10  Pain Score 0  Take Vital Signs  Increase Vital Sign Frequency  Yellow: Q 2hr X 2 then Q 4hr X 2, if remains yellow, continue Q 4hrs  Escalate  MEWS: Escalate Yellow: discuss with charge nurse/RN and consider discussing with provider and RRT  Notify: Charge Nurse/RN  Name of Charge Nurse/RN Notified Rosalene Billings RN  Date Charge Nurse/RN Notified 07/07/20  Time Charge Nurse/RN Notified 0030  Notify: Provider  Provider Name/Title [place]  Date Provider Notified 07/07/20  Time Provider Notified 0030  Notification Type Page  Notification Reason Other (Comment) (elevated HR; MEWS 2)  Response No new orders  Date of Provider Response 07/06/20  Time of Provider Response 1159  Document  Patient Outcome Other (Comment) (Pt stable no new orders received, Will continue to monitor)

## 2020-07-07 NOTE — Progress Notes (Signed)
   New Patient Visit [facility] Consultation - [facility]  Subjective  Mark [name] is a 69 y.o. male who presents for the following: Rash. Patient has a history of severe COPD and chronic respiratory failure and hypoxia on supplemental oxygen; hypothyroidism and anxiety (on chronic benzodiazepines) who was admitted to [facility] 06/18/20 for left side necrotizing pneumonia with lung abscess. I (Dr. Ralene Bathe, MD-dermatologist) have been consulted for evaluation of a rash that has been present for approximately a week and there is question whether this is related to a medication reaction. I reviewed the patient's medical records; laboratory and photographs. The patient tells me he has history of psoriasis of his legs. He feels like this rash is different than his typical psoriasis.  The following portions of the chart were reviewed this encounter and updated as appropriate:  Tobacco  Allergies  Meds  Problems  Med Hx  Surg Hx  Fam Hx      Review of Systems:  No other skin or systemic complaints except as noted in HPI or Assessment and Plan.  Objective  Well appearing patient in no apparent distress; mood and affect are within normal limits.  A focused examination was performed including the face, trunk, and extremities. Relevant physical exam findings are noted in the Assessment and Plan.  Objective  Trunk, extremities: blanching red macular eruption on the shoulders arms chest abdomen and legs. It seems to be sparing the dependent areas of his back and buttocks and posterior body. Some of the spots on his legs are oval. There is very little scale. In photos from 06/19/2020; 07/01/2020 and in the last couple days, there is more scale evident on the rash areas. The patient also has toenail dystrophy. The scalp and face are mostly clear   Assessment & Plan  Rash and other nonspecific skin eruption Trunk, extremities Psoriasis flare with early psoriatic erythroderma versus medication  reaction -   The patient has been treated with systemic steroids since day of admission with IV Solu-Medrol then Decadron injections and oral Decadron. He is also on topical triamcinolone cream. These treatments may be blunting the typical appearance of his rash. Nevertheless the patient does feel like it is worsening.  The patient has been treated with multiple antibiotics including vancomycin; Maxipime; Unasyn; ceftriaxone; Augmentin; metronidazole and doxycycline since admission. Any one of these has potential for being the culprit for a medication reaction.  I communicated with Dr. Loletha Grayer today about my evaluation. I will consider a biopsy in the next 24 hours for narrowing down this differential. If he is discharged from [facility], we can continue this evaluation is as an outpatient at [facility] clinic.  At least 70 minutes spent in time with patient; reviewing chart; on hospital ward and charting.  Return for follow up pending release from hospital.  I, Rudell Cobb, CMA, am acting as scribe for Sarina Ser, MD .  Documentation: I have reviewed the above documentation for accuracy and completeness, and I agree with the above.  Sarina Ser, MD

## 2020-07-08 ENCOUNTER — Inpatient Hospital Stay: Payer: Medicare Other

## 2020-07-08 ENCOUNTER — Ambulatory Visit (HOSPITAL_COMMUNITY): Payer: Medicare Other | Admitting: Dermatology

## 2020-07-08 DIAGNOSIS — F4322 Adjustment disorder with anxiety: Secondary | ICD-10-CM | POA: Diagnosis not present

## 2020-07-08 DIAGNOSIS — R21 Rash and other nonspecific skin eruption: Secondary | ICD-10-CM | POA: Diagnosis not present

## 2020-07-08 DIAGNOSIS — J449 Chronic obstructive pulmonary disease, unspecified: Secondary | ICD-10-CM | POA: Diagnosis not present

## 2020-07-08 DIAGNOSIS — L539 Erythematous condition, unspecified: Secondary | ICD-10-CM | POA: Diagnosis not present

## 2020-07-08 DIAGNOSIS — J189 Pneumonia, unspecified organism: Secondary | ICD-10-CM | POA: Diagnosis not present

## 2020-07-08 LAB — SARS CORONAVIRUS 2 BY RT PCR (HOSPITAL ORDER, PERFORMED IN ~~LOC~~ HOSPITAL LAB): SARS Coronavirus 2: NEGATIVE

## 2020-07-08 MED ORDER — NYSTATIN 100000 UNIT/ML MT SUSP
5.0000 mL | Freq: Four times a day (QID) | OROMUCOSAL | Status: DC
  Administered 2020-07-08 – 2020-07-09 (×4): 500000 [IU] via ORAL
  Filled 2020-07-08 (×4): qty 5

## 2020-07-08 NOTE — Progress Notes (Signed)
[facility] [place] NAME: Mark [name]    MR#:  [id number]  DATE OF BIRTH:  [birth year]  SUBJECTIVE:  patient laying in bed comfortably. Very talkative. Interrupts my discussion with him intermittently. Does not want to get out of the bed and sit in the chair today. Tells me he has been walking around everywhere without any difficulty. Complains of generalized pain. Has been eating fairly well. Dr. Steva Ready in the room.-- According to her rash is improving. Patient thinks he has fever. Temperature documented normal.  REVIEW OF SYSTEMS:   Review of Systems  Constitutional: Negative for chills, fever and weight loss.  HENT: Negative for ear discharge, ear pain and nosebleeds.   Eyes: Negative for blurred vision, pain and discharge.  Respiratory: Positive for shortness of breath. Negative for sputum production, wheezing and stridor.   Cardiovascular: Negative for chest pain, palpitations, orthopnea and PND.  Gastrointestinal: Negative for abdominal pain, diarrhea, nausea and vomiting.  Genitourinary: Negative for frequency and urgency.  Musculoskeletal: Positive for back pain. Negative for joint pain.  Skin: Positive for rash.  Neurological: Positive for weakness. Negative for sensory change, speech change and focal weakness.  Psychiatric/Behavioral: Negative for depression and hallucinations. The patient is not nervous/anxious.    Tolerating Diet:yes Tolerating PT: rehab  DRUG ALLERGIES:   Allergies  Allergen Reactions   Augmentin [Amoxicillin-Pot Clavulanate] Rash    Maculopapular rash after receiving several days of amoxicillin/clavulanate despite tolerating week of ampicillin/sulbactam    VITALS:  Blood pressure 118/83, pulse 87, temperature 98.2 F (36.8 C), resp. rate 16, height 6' (1.829 m), weight 81.6 kg, SpO2 90 %.  PHYSICAL EXAMINATION:   Physical Exam  GENERAL:  69 y.o.-year-old patient lying in the bed with no  acute distress.  EYES: Pupils equal, round, reactive to light and accommodation. No scleral icterus.   HEENT: Head atraumatic, normocephalic. Oropharynx and nasopharynx clear.  NECK:  Supple, no jugular venous distention. No thyroid enlargement, no tenderness.  LUNGS: decreased breath sounds bilaterally, no wheezing, rales, rhonchi. No use of accessory muscles of respiration. No respiratory distress. CARDIOVASCULAR: S1, S2 normal. No murmurs, rubs, or gallops.  ABDOMEN: Soft, nontender, nondistended. Bowel sounds present. No organomegaly or mass.  EXTREMITIES: No cyanosis, clubbing or edema b/l.    NEUROLOGIC: Cranial nerves II through XII are intact. No focal Motor or sensory deficits b/l.   PSYCHIATRIC:  patient is alert and oriented x 3.  SKIN: Deep redness with blanching on his anterior chest. Right back improved from previous Right wrist improved from previous fading erythema.  No warmth. Bilateral legs does have patchy erythema.   LABORATORY PANEL:  CBC Recent Labs  Lab 07/07/20 0323  WBC 12.5*  HGB 11.6*  HCT 35.7*  PLT 530*    Chemistries  Recent Labs  Lab 07/05/20 0549 07/05/20 0549 07/06/20 0337  NA 139   < > 137  K 3.5   < > 3.8  CL 97*   < > 93*  CO2 34*   < > 33*  GLUCOSE 107*   < > 81  BUN 9   < > 8  CREATININE 0.71   < > 0.70  CALCIUM 8.4*   < > 8.5*  AST 23  --   --   ALT 14  --   --   ALKPHOS 66  --   --   BILITOT 0.4  --   --    < > = values in this interval  not displayed.   Cardiac Enzymes No results for input(s): TROPONINI in the last 168 hours. RADIOLOGY:  DG Chest Port 1 View  Result Date: 07/08/2020 CLINICAL DATA:  Chronic shortness of breath EXAM: PORTABLE CHEST 1 VIEW COMPARISON:  Chest radiograph July 04, 2020; chest CT June 18, 2020 FINDINGS: There remains extensive fibrotic change on the left with volume loss. Irregular consolidation throughout the left upper and mid lung regions is stable. The suspected cavitation seen in the left  upper lobe on prior CT is less well delineated on current examination. The right lung is mildly hyperexpanded. Interstitial prominence on the right in part may be due to redistribution of blood flow to viable lung segments. Heart size is normal. Pulmonary vascularity appears distorted on the left. Pulmonary vascular on the right is unremarkable. No adenopathy appreciable by radiography. No bone lesions. IMPRESSION: Extensive underlying fibrosis and volume loss on the left with airspace opacity throughout the upper and mid lung regions, stable. No well-defined cavitation. Appearance of left lung stable. Right lung mildly hyperexpanded, stable. Interstitial prominence on the right in part may be due to redistribution of blood flow to viable lung segments. Stable cardiac silhouette. Electronically Signed   By: Lowella Grip III M.D.   On: 07/08/2020 09:49   ASSESSMENT AND PLAN:  Mark [name] is a 69 y.o. male with medical history significant for severe COPD and chronic respiratory failure with hypoxia on 4 L supplemental O2 via [place] chronically, hypothyroidism, and anxiety on chronic benzodiazepines who presents to the ED for evaluation of progressive shortness of breath.  1. rash on the chest abdomen and back -patient was seen by dermatology Dr. Nehemiah Massed. Status post biopsy. Results will take about 3 to 7 days. Patient will follow-up with Dr. Nehemiah Massed as outpatient -could be drug related versus versus psoriasis. -It looks like patient's rash started after antibiotic. -Rash is improving.  2. necrotizing pneumonia -per infectious disease continue doxycycline and Flagyl till  07/22/2020 -continue oxygen-- chronic -patient will follow up with ideas outpatient -no fever  3. COPD with acute on chronic hypoxic hyper cardiac respiratory failure -oxygen is 90 to 95% on 4 L oxygen nasal cannula -x-ray shows severe emphysema with persistent left-sided infiltrate? Fibrosis -discussed with Dr.aleskerov  No indication for acute BiPAP at present. Patient will follow-up closely with Dr.aleskerov of as outpatient. Okay to discharge from pulmonary standpoint. Patient currently is best at baseline. -Patient has been protesting his discharge. At this point he is medically stable and best at baseline for discharge. This was discussed with patient and TOC. [facility].  4. Anxiety continue Klonopin. Patient has had psych consultation done  5. Hypothyroidism continue Synthroid  6. Jerrye Bushy on PPI and Carafate     Procedures: Family communication : none Consults : pulmonary, ID CODE STATUS: DNR DVT Prophylaxis : Lovenox  Status is: Inpatient    Dispo: The patient is from: Home              Anticipated d/c is to: SNF              Anticipated d/c date is: 1 day              Patient currently is medically stable to d/c. patient currently is medically best at baseline. He has multiple comorbidities including severe emphysema with necrotizing pneumonia and possible lung scarring. Patient currently is best at baseline. He is currently on oral antibiotics which can be completed as outpatient.  He  is at a high risk for cardiorespiratory arrest and readmission given his multiple comorbidities       TOTAL TIME TAKING CARE OF THIS PATIENT: *25* minutes.  >50% time spent on counselling and coordination of care  Note: This dictation was prepared with Dragon dictation along with smaller phrase technology. Any transcriptional errors that result from this process are unintentional.  Fritzi Mandes M.D    [facility]   CC: Primary care physician; [place], Delcie Roch, MDPatient ID: Mark [name], male   DOB: [birth year], 69 y.o.   MRN: [id number]

## 2020-07-08 NOTE — TOC Progression Note (Signed)
Transition of Care [facility]) - Progression Note    Patient Details  Name: Mark [name] MRN: [id number] Date of Birth: [birth year]  Transition of Care [facility]) CM/SW [place], RN Phone Number: 07/08/2020, 7:22 AM  Clinical Narrative:   RNCM unable to obtain bipap for patient to discharge to facility. Per both [employer] and [employer] representatives the facility will need to be responsible for obtaining bipap. Per [facility] representative they are unable to obtain a bipap due to declared shortage of these devices.     Expected Discharge Plan: [place] Barriers to Discharge: Continued Medical Work up  Expected Discharge Plan and Services Expected Discharge Plan: [place]   Discharge Planning Services: CM Consult Post Acute Care Choice: [place] arrangements for the past 2 months: Single Family Home Expected Discharge Date: 07/03/20                         HH Arranged: RN, PT, OT, Nurse's Aide, Social Work [employer] Agency: [place] ([place]) Date [place]: 06/24/20 Time [place]: 3524 Representative spoke with at [place]: [place] ([place]) Interventions    Readmission Risk Interventions No flowsheet data found.

## 2020-07-08 NOTE — Patient Instructions (Signed)

## 2020-07-08 NOTE — Plan of Care (Signed)
  Problem: Education: Goal: Knowledge of General Education information will improve Description Including pain rating scale, medication(s)/side effects and non-pharmacologic comfort measures Outcome: Progressing   

## 2020-07-08 NOTE — Care Management Important Message (Signed)
Important Message  Patient Details  Name: Mark [name] MRN: [id number] Date of Birth: [birth year]   Medicare Important Message Given:  Yes     Dannette Barbara 07/08/2020, 11:29 AM

## 2020-07-08 NOTE — Progress Notes (Signed)
Mark [name] is a 69 y.o. male who presents for the following: follow up hospital evaluation (patient is being seen today for a re-evaluation of persistent rash and to have biopsies performed). The pt feels the rash is worse today.  He feels like it is not consistent with his typical psoriasis and extends beyond usual areas involved.  The following portions of the chart were reviewed this encounter and updated as appropriate:  Tobacco  Allergies  Meds  Problems  Med Hx  Surg Hx  Fam Hx     Review of Systems:  No other skin or systemic complaints except as noted in HPI or Assessment and Plan.  Objective  Well appearing patient in no apparent distress; mood and affect are within normal limits.  A focused examination was performed including the trunk and extremities. Relevant physical exam findings are noted in the Assessment and Plan.  Objective  L abdomen, L knee: blanching red macular eruption on the shoulders arms chest abdomen and legs. It seems to be sparing the dependent areas of his back and buttocks and posterior body. Some of the spots on his legs are oval. There is very little scale. In photos from 06/19/2020; 07/01/2020 and in the last couple days, there is more scale evident on the rash areas. The patient also has toenail dystrophy. The scalp and face are mostly clear   Assessment & Plan  Rash (2) - Psoriasis (+ history of psoriasis) vs Drug reaction L knee; L abdomen R/o psoriasis flare vs drug reaction No significant change from last visit.  Discussed with pt that biopsy would be best way to distinguish between DDx.  Pt agrees to biopsies.  Skin / nail biopsy - L abdomen Type of biopsy: punch   Informed consent: discussed and consent obtained   Timeout: patient name, date of birth, surgical site, and procedure verified   Procedure prep:  Patient was prepped and draped in usual sterile fashion (the patient was  cleaned and prepped) Prep type:  Isopropyl alcohol Anesthesia: the lesion was anesthetized in a standard fashion   Anesthetic:  1% lidocaine w/ epinephrine 1-100,000 buffered w/ 8.4% NaHCO3 Punch size:  3 mm Suture size:  4-0 Suture type: nylon   Hemostasis achieved with: suture, pressure and aluminum chloride   Outcome: patient tolerated procedure well   Post-procedure details: sterile dressing applied and wound care instructions given   Dressing type: bandage, petrolatum and pressure dressing    Skin / nail biopsy - L knee Type of biopsy: punch   Informed consent: discussed and consent obtained   Timeout: patient name, date of birth, surgical site, and procedure verified   Procedure prep:  Patient was prepped and draped in usual sterile fashion (the patient was cleaned and prepped) Prep type:  Isopropyl alcohol Anesthesia: the lesion was anesthetized in a standard fashion   Anesthetic:  1% lidocaine w/ epinephrine 1-100,000 buffered w/ 8.4% NaHCO3 Punch size:  3 mm Suture size:  4-0 Suture type: nylon   Hemostasis achieved with: suture, pressure and aluminum chloride   Outcome: patient tolerated procedure well   Post-procedure details: sterile dressing applied and wound care instructions given   Dressing type: bandage, petrolatum and pressure dressing    Specimen 1 - Surgical pathology Differential Diagnosis: D48.5 r/o psoriasis flare vs drug reaction Check Margins: No blanching red macular eruption on the shoulders arms chest abdomen and legs. It seems to be sparing the dependent areas of  his back and buttocks and posterior body. Some of the spots on his legs are oval. There is very little scale. In photos from 06/19/2020; 07/01/2020 and in the last couple days, there is more scale evident on the rash areas. The patient also has toenail dystrophy. The scalp and face are mostly clear  Specimen 2 - Surgical pathology Differential Diagnosis: D48.5 r/o psoriasis flare vs drug reaction    Check Margins: No blanching red macular eruption on the shoulders arms chest abdomen and legs. It seems to be sparing the dependent areas of his back and buttocks and posterior body. Some of the spots on his legs are oval. There is very little scale. In photos from 06/19/2020; 07/01/2020 and in the last couple days, there is more scale evident on the rash areas. The patient also has toenail dystrophy. The scalp and face are mostly clear  Please have sutures removed from biopsy sites in 1 week.  If pt is discharged in the meantime, make appt with Dr Sarina Ser - Dermatologist at [facility] [phone] for re-evaluation and suture removal.  35 + minutes spent with pt and at bedside and in hospital ward today  Return in about 1 week (around 07/15/2020) for suture removal and pathology results.  Luther Redo, CMA, am acting as scribe for Sarina Ser, MD .  Documentation: I have reviewed the above documentation for accuracy and completeness, and I agree with the above.  Sarina Ser, MD

## 2020-07-08 NOTE — Progress Notes (Signed)
PT Cancellation Note  Patient Details Name: Mark [name] MRN: [id number] DOB: 1951-05-15   Cancelled Treatment:    Reason Eval/Treat Not Completed: Other (comment). Pt on phone upon entrance, PT showed the patient her badge to avoid interrupting his phone call, pt nodded. PT noticed pt oxygen tubing, phone cord, and phone wire tangled, attempted to address while pt continued to talk on the phone. Pt became agitated and told the therapist that he was not able to do anything today due to a fever and not feeling well. PT asked patient if he would at least like to get up to the chair, adamantly denied and returned to phone call. PT attempted to untangle call bell and phone wire prior to exiting room and pt yelled at therapist to "get out get out get out!" and "you're treating me like a child"! PT to re-attempt as able pending pt's willingness to participate with therapy. Per chart PT has had multiple daily attempts to work with patient this admission.    Lieutenant Diego PT, DPT 11:27 AM,07/08/20

## 2020-07-08 NOTE — Progress Notes (Signed)
Date of Admission:  06/18/2020      ID: Rankin Coolman is a 69 y.o. male  Principal Problem:   Adjustment disorder with anxious mood Active Problems:   Anxiety   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 (HCC)   Hypokalemia   Hypothyroidism   Necrotizing pneumonia (HCC)   Abscess of left lung with pneumonia (HCC)   Maculopapular rash, generalized   Right arm cellulitis   COPD exacerbation (HCC)   Chronic respiratory failure with hypoxia (HCC)   Acute on chronic respiratory failure with hypoxia and hypercapnia (HCC)    Subjective: Pt 's skin is better C/o some irritation in the throat   Medications:  . aspirin EC  81 mg Oral Daily  . clonazePAM  1 mg Oral BID  . doxycycline  100 mg Oral Q12H  . enoxaparin (LOVENOX) injection  40 mg Subcutaneous Q24H  . fluticasone furoate-vilanterol  1 puff Inhalation Daily  . guaiFENesin  1,200 mg Oral BID  . hydrocerin   Topical BID  . ipratropium  2 puff Inhalation Q4H  . levothyroxine  25 mcg Oral QAC breakfast  . lidocaine  15 mL Oral Once  . metroNIDAZOLE  500 mg Oral Q8H  . multivitamin with minerals  1 tablet Oral Daily  . nystatin   Topical TID  . pantoprazole  40 mg Oral Daily  . senna-docusate  1 tablet Oral BID  . sucralfate  1 g Oral TID WC & HS  . triamcinolone cream   Topical BID  . umeclidinium bromide  1 puff Inhalation Daily    Objective: Vital signs in last 24 hours: Temp:  [97.9 F (36.6 C)-98.3 F (36.8 C)] 97.9 F (36.6 C) (10/06 0741) Pulse Rate:  [88-108] 88 (10/06 0741) Resp:  [14-20] 14 (10/06 0741) BP: (128-141)/(60-77) 128/77 (10/06 0741) SpO2:  [95 %-97 %] 95 % (10/06 0741)  PHYSICAL EXAM:  General: Alert, cooperative, no distress, appears stated age.  Head: Normocephalic, without obvious abnormality, atraumatic. Eyes: Conjunctivae clear, anicteric sclerae. Pupils are equal ENT Nares normal. No drainage or sinus tenderness. Pharyngeal - whitish spots Neck:  Supple, symmetrical, no adenopathy, thyroid: non tender no carotid bruit and no JVD. Back: No CVA tenderness. Lungs:b/l air entry- crepts b/l Heart: Regular rate and rhythm, no murmur, rub or gallop. Abdomen: Soft, non-tender,not distended. Bowel sounds normal. No masses Extremities: atraumatic, no cyanosis. No edema. No clubbing Skin: macular erythema front of chest and legs- improving Lymph: Cervical, supraclavicular normal. Neurologic: Grossly non-focal  Lab Results Recent Labs    07/06/20 0337 07/07/20 0323  WBC 12.8* 12.5*  HGB 12.3* 11.6*  HCT 38.6* 35.7*  NA 137  --   K 3.8  --   CL 93*  --   CO2 33*  --   BUN 8  --   CREATININE 0.70  --    Liver Panel No results for input(s): PROT, ALBUMIN, AST, ALT, ALKPHOS, BILITOT, BILIDIR, IBILI in the last 72 hours. Sedimentation Rate No results for input(s): ESRSEDRATE in the last 72 hours. C-Reactive Protein No results for input(s): CRP in the last 72 hours.  Microbiology:  Studies/Results: DG Chest Port 1 View  Result Date: 07/08/2020 CLINICAL DATA:  Chronic shortness of breath EXAM: PORTABLE CHEST 1 VIEW COMPARISON:  Chest radiograph July 04, 2020; chest CT June 18, 2020 FINDINGS: There remains extensive fibrotic change on the left with volume loss. Irregular consolidation throughout the left upper and mid lung regions is stable. The suspected cavitation seen in  the left upper lobe on prior CT is less well delineated on current examination. The right lung is mildly hyperexpanded. Interstitial prominence on the right in part may be due to redistribution of blood flow to viable lung segments. Heart size is normal. Pulmonary vascularity appears distorted on the left. Pulmonary vascular on the right is unremarkable. No adenopathy appreciable by radiography. No bone lesions. IMPRESSION: Extensive underlying fibrosis and volume loss on the left with airspace opacity throughout the upper and mid lung regions, stable. No  well-defined cavitation. Appearance of left lung stable. Right lung mildly hyperexpanded, stable. Interstitial prominence on the right in part may be due to redistribution of blood flow to viable lung segments. Stable cardiac silhouette. Electronically Signed   By: Lowella Grip III M.D.   On: 07/08/2020 09:49     Assessment/Plan: Antibiotics 9/17-9/20 ceftriaxone 9/20-9/28 Ampicillin/sulbactam 9/28-9/29 augmentin 9/29-9/30 ceftriaxone+flagyl 10/1 Doxy + flagyl  Impression/recommendation  Macular/papular erythrodermic  Rash over the front of the torso, legs ? 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 No SJS Amoxicillin was stopped last week- rash usually gets worse even after stopping the culprit drug and then gets better He was started on Doxy 07/03/20 last Friday after the rash was noted  Appreciate Derm consult  Oropharyngeal candidiasis- nystatin swish and swallow  Necrotizing left sided pneumonia- continue Doxy + flagyl for 4 weeks-day 26- need until 07/22/20 Thrombocytosis due to necrotizing infection  COPD  Discussed the management with patient and care team Will follow him as OP

## 2020-07-08 NOTE — TOC Progression Note (Signed)
Transition of Care [facility]) - Progression Note    Patient Details  Name: Mark [name] MRN: [id number] Date of Birth: 06/24/1951  Transition of Care [facility]) CM/SW [place], RN Phone Number: 07/08/2020, 3:53 PM  Clinical Narrative:   [facility] notified Chris with [facility] that patient will likely be ready for d/c tomorrow. Additional updated clinicals sent through the hub per there request.     Expected Discharge Plan: [place] Barriers to Discharge: Continued Medical Work up  Expected Discharge Plan and Services Expected Discharge Plan: [place]   Discharge Planning Services: CM Consult Post Acute Care Choice: [place] arrangements for the past 2 months: Single Family Home Expected Discharge Date: 07/03/20                         HH Arranged: RN, PT, OT, Nurse's Aide, Social Work [employer] Agency: [place] ([place]) Date [place]: 06/24/20 Time [place]: 7841 Representative spoke with at [place]: [place] ([place]) Interventions    Readmission Risk Interventions No flowsheet data found.

## 2020-07-09 DIAGNOSIS — J9621 Acute and chronic respiratory failure with hypoxia: Secondary | ICD-10-CM | POA: Diagnosis not present

## 2020-07-09 DIAGNOSIS — F419 Anxiety disorder, unspecified: Secondary | ICD-10-CM | POA: Diagnosis not present

## 2020-07-09 DIAGNOSIS — Z23 Encounter for immunization: Secondary | ICD-10-CM | POA: Diagnosis present

## 2020-07-09 DIAGNOSIS — J984 Other disorders of lung: Secondary | ICD-10-CM

## 2020-07-09 DIAGNOSIS — J850 Gangrene and necrosis of lung: Secondary | ICD-10-CM | POA: Diagnosis not present

## 2020-07-09 DIAGNOSIS — J189 Pneumonia, unspecified organism: Secondary | ICD-10-CM | POA: Diagnosis not present

## 2020-07-09 NOTE — Discharge Summary (Signed)
Mark [name] NAME: Mark [name]    MR#:  [id number]  DATE OF BIRTH:  1951-02-16  DATE OF ADMISSION:  06/18/2020 ADMITTING PHYSICIAN: Mark Cordia, MD  DATE OF DISCHARGE: 07/09/2020  PRIMARY CARE PHYSICIAN: Colford, Delcie Roch, MD    ADMISSION DIAGNOSIS:  Necrotizing pneumonia ([place]) [J85.0] COPD exacerbation ([place]) [J44.1] Cavitary pneumonia [J18.9, J98.4]  DISCHARGE DIAGNOSIS:  Rash--s/p biopsy (durg reaction vs psoriasis)--f/u Dermatology Necrotizing Pneumonia Severe emphysema with acute on chronic hypoxic/hypercarbic respiratory failure/[place] home oxygen  SECONDARY DIAGNOSIS:   Past Medical History:  Diagnosis Date  . Acute respiratory failure with hypoxia and hypercapnia ([place]) 05/01/2016  . Asthma   . COPD (chronic obstructive pulmonary disease) ([place])   . Osteoporosis     HOSPITAL COURSE:   Mark [name] a 69 y.o.malewith medical history significant forsevere COPD and chronic respiratory failure with hypoxia on 4 L supplemental O2 via [place] chronically, hypothyroidism, and anxiety on chronic benzodiazepines who presents to the ED for evaluation of progressive shortness of breath.  1. rash on the chest abdomen and back -patient was seen by dermatology Dr. Nehemiah [name]. Status post biopsy. Results will take about 3 to 7 days. Patient will follow-up with Dr. Nehemiah [name] as outpatient -could be drug related versus versus psoriasis-per Dermatology -It looks like patient's rash started after antibiotic. -Rash is improving.  2. necrotizing pneumonia -per infectious disease continue doxycycline and Flagyl till  07/22/2020 -continue oxygen-- chronic -patient will follow up with ID as outpatient -no fever  3. COPD with acute on chronic hypoxic hyper cardiac respiratory failure -oxygen is 90 to 95% on 4 L oxygen nasal cannula -x-ray shows severe emphysema with persistent left-sided infiltrate? Fibrosis -discussed with  Mark [name] No indication for acute BiPAP at present. Patient will follow-up closely with Mark [name] of as outpatient. Okay to discharge from pulmonary standpoint. Patient currently is best at baseline. - At this point pt is medically stable and best at baseline for discharge. This was discussed with patient and TOC. [facility]. Pt is in agreement now.   4. Anxiety continue Klonopin. Patient has had psych consultation done.  5. Hypothyroidism continue Synthroid  6. Mark [name] on PPI and Carafate   Family communication : none Consults : pulmonary, ID CODE STATUS: DNR DVT Prophylaxis : Lovenox  Status is: Inpatient    Dispo: The patient is from: Home  Anticipated d/c is to: [facility]  Anticipated d/c date is: today  Patient currently is medically stable to d/c. patient currently is medically best at baseline. He has multiple comorbidities including severe emphysema with necrotizing pneumonia and possible lung scarring.  He is currently on oral antibiotics which can be completed as outpatient.  He is at a high risk for cardiorespiratory arrest and readmission given his multiple comorbidities  Repeat COVID negative  CONSULTS OBTAINED:  Treatment Team:  Mark Glazier, MD Mark Pour, NP Mark Bathe, MD  DRUG ALLERGIES:   Allergies  Allergen Reactions  . Augmentin [Amoxicillin-Pot Clavulanate] Rash    Maculopapular rash after receiving several days of amoxicillin/clavulanate despite tolerating week of ampicillin/sulbactam    DISCHARGE MEDICATIONS:   Allergies as of 07/09/2020      Reactions   Augmentin [amoxicillin-pot Clavulanate] Rash   Maculopapular rash after receiving several days of amoxicillin/clavulanate despite tolerating week of ampicillin/sulbactam      Medication List    TAKE these medications   albuterol 108 (90 Base) MCG/ACT inhaler Commonly known as: VENTOLIN  HFA Inhale 2 puffs into the lungs every 4 (four) hours as needed for wheezing or shortness of breath.   aspirin 81 MG tablet Take 81 mg by mouth daily.   budesonide-formoterol 160-4.5 MCG/ACT inhaler Commonly known as: SYMBICORT Inhale 2 puffs into the lungs 2 (two) times daily. Notes to patient: Not given this hospitalization   clonazePAM 1 MG tablet Commonly known as: KLONOPIN Take 1 tablet (1 mg total) by mouth 2 (two) times daily.   cyclobenzaprine 10 MG tablet Commonly known as: FLEXERIL Take 10 mg by mouth 3 (three) times daily as needed for muscle spasms. Notes to patient: Not given this hospitalization   dexamethasone 4 MG tablet Commonly known as: DECADRON Take 1 tablet (4 mg total) by mouth daily.   diphenhydrAMINE 25 mg capsule Commonly known as: BENADRYL Take 25-50 mg by mouth every 6 (six) hours as needed for allergies.   doxycycline 100 MG tablet Commonly known as: VIBRA-TABS Take 1 tablet (100 mg total) by mouth every 12 (twelve) hours.   fluticasone 50 MCG/ACT nasal spray Commonly known as: FLONASE Place 2 sprays into both nostrils daily as needed for allergies.   guaiFENesin 600 MG 12 hr tablet Commonly known as: MUCINEX Take 2 tablets (1,200 mg total) by mouth 2 (two) times daily for 14 days.   hydrocerin Crea Apply 1 application topically 2 (two) times daily.   ibuprofen 400 MG tablet Commonly known as: ADVIL Take 1 tablet (400 mg total) by mouth every 8 (eight) hours as needed for headache or moderate pain.   ipratropium 17 MCG/ACT inhaler Commonly known as: ATROVENT HFA Inhale 2 puffs into the lungs every 6 (six) hours.   levothyroxine 25 MCG tablet Commonly known as: SYNTHROID Take 25 mcg by mouth daily before breakfast.   metroNIDAZOLE 500 MG tablet Commonly known as: FLAGYL Take 1 tablet (500 mg total) by mouth every 8 (eight) hours.   multivitamin with minerals Tabs tablet Take 1 tablet by mouth daily.   nystatin powder Commonly  known as: MYCOSTATIN/NYSTOP Apply topically 3 (three) times daily.   Oxycodone HCl 10 MG Tabs Take 1 tablet (10 mg total) by mouth every 6 (six) hours as needed.   pantoprazole 40 MG tablet Commonly known as: PROTONIX Take 1 tablet (40 mg total) by mouth daily.   polyethylene glycol 17 g packet Commonly known as: MIRALAX / GLYCOLAX Take 17 g by mouth daily as needed for moderate constipation.   Primatene Mist 0.125 MG/ACT Aero Generic drug: EPINEPHrine Inhale 1 Dose into the lungs as needed (allergies).   sucralfate 1 g tablet Commonly known as: CARAFATE Take 1 tablet (1 g total) by mouth 4 (four) times daily -  with meals and at bedtime.   tiotropium 18 MCG inhalation capsule Commonly known as: SPIRIVA Place 18 mcg into inhaler and inhale daily.   Trelegy Ellipta 100-62.5-25 MCG/INH Aepb Generic drug: Fluticasone-Umeclidin-Vilant Inhale 1 puff into the lungs daily. Notes to patient: Not given this hospitalization   triamcinolone cream 0.5 % Commonly known as: KENALOG Apply to rash chest and back bid       If you experience worsening of your admission symptoms, develop shortness of breath, life threatening emergency, suicidal or homicidal thoughts you must seek medical attention immediately by calling 911 or calling your MD immediately  if symptoms less severe.  You Must read complete instructions/literature along with all the possible adverse reactions/side effects for all the Medicines you take and that have been prescribed to you. Take any new  Medicines after you have completely understood and accept all the possible adverse reactions/side effects.   Please note  You were cared for by a hospitalist during your hospital stay. If you have any questions about your discharge medications or the care you received while you were in the hospital after you are discharged, you can call the unit and asked to speak with the hospitalist on call if the hospitalist that took care of  you is not available. Once you are discharged, your primary care physician will handle any further medical issues. Please note that NO REFILLS for any discharge medications will be authorized once you are discharged, as it is imperative that you return to your primary care physician (or establish a relationship with a primary care physician if you do not have one) for your aftercare needs so that they can reassess your need for medications and monitor your lab values. Today   SUBJECTIVE   Overall stable. Wants his oxycodone on time. No new issues per RN Completes sentence w/o any resp distress. sats >90% on 3 liter [place]  VITAL SIGNS:  Blood pressure 131/76, pulse (!) 103, temperature 98.6 F (37 C), temperature source Oral, resp. rate 16, height 6' (1.829 m), weight 81.6 kg, SpO2 94 %.  I/O:    Intake/Output Summary (Last 24 hours) at 07/09/2020 0834 Last data filed at 07/09/2020 0543 Gross per 24 hour  Intake 0 ml  Output 1200 ml  Net -1200 ml    PHYSICAL EXAMINATION:   GENERAL:  69 y.o.-year-old patient lying in the bed with no acute distress. Appears chronically ill HEENT: Head atraumatic, normocephalic. Oropharynx and nasopharynx clear.  NECK:  Supple, no jugular venous distention. No thyroid enlargement, no tenderness.  LUNGS: decreased breath sounds bilaterally, no wheezing, rales, rhonchi. No use of accessory muscles of respiration. No respiratory distress. CARDIOVASCULAR: S1, S2 normal. No murmurs, rubs, or gallops.  ABDOMEN: Soft, nontender, nondistended. Bowel sounds present. No organomegaly or mass.  EXTREMITIES: No cyanosis, clubbing or edema b/l.    NEUROLOGIC: Cranial nerves II through XII are intact. No focal Motor or sensory deficits b/l.   PSYCHIATRIC:  patient is alert and oriented x 3.  SKIN: Deep redness with blanching on his anterior chest. Right back improved from previous Right wrist improved from previous fading erythema. No warmth. Bilateral legs does have  patchy erythema.  DATA REVIEW:   CBC  Recent Labs  Lab 07/07/20 0323  WBC 12.5*  HGB 11.6*  HCT 35.7*  PLT 530*    Chemistries  Recent Labs  Lab 07/05/20 0549 07/05/20 0549 07/06/20 0337  NA 139   < > 137  K 3.5   < > 3.8  CL 97*   < > 93*  CO2 34*   < > 33*  GLUCOSE 107*   < > 81  BUN 9   < > 8  CREATININE 0.71   < > 0.70  CALCIUM 8.4*   < > 8.5*  AST 23  --   --   ALT 14  --   --   ALKPHOS 66  --   --   BILITOT 0.4  --   --    < > = values in this interval not displayed.    Microbiology Results   Recent Results (from the past 240 hour(s))  Respiratory Panel by RT PCR (Flu A&B, Covid) - Nasopharyngeal Swab     Status: None   Collection Time: 07/03/20 10:18 AM   Specimen: Nasopharyngeal Swab  Result Value Ref Range Status   SARS Coronavirus 2 by RT PCR NEGATIVE NEGATIVE Final    Comment: (NOTE) SARS-CoV-2 target nucleic acids are NOT DETECTED.  The SARS-CoV-2 RNA is generally detectable in upper respiratoy specimens during the acute phase of infection. The lowest concentration of SARS-CoV-2 viral copies this assay can detect is 131 copies/mL. A negative result does not preclude SARS-Cov-2 infection and should not be used as the sole basis for treatment or other patient management decisions. A negative result may occur with  improper specimen collection/handling, submission of specimen other than nasopharyngeal swab, presence of viral mutation(s) within the areas targeted by this assay, and inadequate number of viral copies (<131 copies/mL). A negative result must be combined with clinical observations, patient history, and epidemiological information. The expected result is Negative.  Fact Sheet for Patients:  PinkCheek.be  Fact Sheet for Healthcare Providers:  GravelBags.it  This test is no t yet approved or cleared by the [place] FDA and  has been authorized for detection and/or diagnosis  of SARS-CoV-2 by FDA under an Emergency Use Authorization (EUA). This EUA will remain  in effect (meaning this test can be used) for the duration of the COVID-19 declaration under Section 564(b)(1) of the Act, 21 U.S.C. section 360bbb-3(b)(1), unless the authorization is terminated or revoked sooner.     Influenza A by PCR NEGATIVE NEGATIVE Final   Influenza B by PCR NEGATIVE NEGATIVE Final    Comment: (NOTE) The Xpert Xpress SARS-CoV-2/FLU/RSV assay is intended as an aid in  the diagnosis of influenza from Nasopharyngeal swab specimens and  should not be used as a sole basis for treatment. Nasal washings and  aspirates are unacceptable for Xpert Xpress SARS-CoV-2/FLU/RSV  testing.  Fact Sheet for Patients: PinkCheek.be  Fact Sheet for Healthcare Providers: GravelBags.it  This test is not yet approved or cleared by the [place] FDA and  has been authorized for detection and/or diagnosis of SARS-CoV-2 by  FDA under an Emergency Use Authorization (EUA). This EUA will remain  in effect (meaning this test can be used) for the duration of the  Covid-19 declaration under Section 564(b)(1) of the Act, 21  U.S.C. section 360bbb-3(b)(1), unless the authorization is  terminated or revoked. Performed at [facility], [address] [place], [place] [address]   SARS Coronavirus 2 by RT PCR (hospital order, performed in [facility] hospital lab) Nasopharyngeal Nasopharyngeal Swab     Status: None   Collection Time: 07/08/20  6:08 PM   Specimen: Nasopharyngeal Swab  Result Value Ref Range Status   SARS Coronavirus 2 NEGATIVE NEGATIVE Final    Comment: (NOTE) SARS-CoV-2 target nucleic acids are NOT DETECTED.  The SARS-CoV-2 RNA is generally detectable in upper and lower respiratory specimens during the acute phase of infection. The lowest concentration of SARS-CoV-2 viral copies this assay can detect is 250 copies /  mL. A negative result does not preclude SARS-CoV-2 infection and should not be used as the sole basis for treatment or other patient management decisions.  A negative result may occur with improper specimen collection / handling, submission of specimen other than nasopharyngeal swab, presence of viral mutation(s) within the areas targeted by this assay, and inadequate number of viral copies (<250 copies / mL). A negative result must be combined with clinical observations, patient history, and epidemiological information.  Fact Sheet for Patients:   StrictlyIdeas.no  Fact Sheet for Healthcare Providers: BankingDealers.co.za  This test is not yet approved or  cleared by the  [place] Architectural technologist and has been authorized for detection and/or diagnosis of SARS-CoV-2 by FDA under an Print production planner (EUA).  This EUA will remain in effect (meaning this test can be used) for the duration of the COVID-19 declaration under Section 564(b)(1) of the Act, 21 U.S.C. section 360bbb-3(b)(1), unless the authorization is terminated or revoked sooner.  Performed at [facility], [address] [place], [place] [address]     RADIOLOGY:  [facility] Chest Port 1 View  Result Date: 07/08/2020 CLINICAL DATA:  Chronic shortness of breath EXAM: PORTABLE CHEST 1 VIEW COMPARISON:  Chest radiograph July 04, 2020; chest CT June 18, 2020 FINDINGS: There remains extensive fibrotic change on the left with volume loss. Irregular consolidation throughout the left upper and mid lung regions is stable. The suspected cavitation seen in the left upper lobe on prior CT is less well delineated on current examination. The right lung is mildly hyperexpanded. Interstitial prominence on the right in part may be due to redistribution of blood flow to viable lung segments. Heart size is normal. Pulmonary vascularity appears distorted on the left. Pulmonary vascular on  the right is unremarkable. No adenopathy appreciable by radiography. No bone lesions. IMPRESSION: Extensive underlying fibrosis and volume loss on the left with airspace opacity throughout the upper and mid lung regions, stable. No well-defined cavitation. Appearance of left lung stable. Right lung mildly hyperexpanded, stable. Interstitial prominence on the right in part may be due to redistribution of blood flow to viable lung segments. Stable cardiac silhouette. Electronically Signed   By: Lowella Grip III M.D.   On: 07/08/2020 09:49     CODE STATUS:     Code Status Orders  (From admission, onward)         Start     Ordered   06/22/20 1409  Do not attempt resuscitation (DNR)  Continuous       Question Answer Comment  In the event of cardiac or respiratory ARREST Do not call a "code blue"   In the event of cardiac or respiratory ARREST Do not perform Intubation, CPR, defibrillation or ACLS   In the event of cardiac or respiratory ARREST Use medication by any route, position, wound care, and other measures to relive pain and suffering. May use oxygen, suction and manual treatment of airway obstruction as needed for comfort.   Comments MOST form on chart.      06/22/20 1409        Code Status History    Date Active Date Inactive Code Status Order ID Comments User Context   06/18/2020 2325 06/22/2020 1409 Full Code [id number]  Mark Cordia, MD ED   01/18/2019 0010 01/18/2019 2118 Full Code [id number]  Lance Coon, MD Inpatient   05/01/2016 0934 05/03/2016 1455 Full Code [id number]  Harrie Foreman, MD Inpatient   Advance Care Planning Activity       TOTAL TIME TAKING CARE OF THIS PATIENT: **35* minutes.    Fritzi Mandes M.D  [facility]    CC: Primary care physician; Colford, Delcie Roch, MD

## 2020-07-09 NOTE — Discharge Instructions (Signed)
Use your oxygen and inhalers as before °

## 2020-07-09 NOTE — Progress Notes (Signed)
Report called to Natalia at [facility]

## 2020-07-09 NOTE — Progress Notes (Signed)
PT Cancellation Note  Patient Details Name: Mark [name] MRN: [id number] DOB: 12-14-1950   Cancelled Treatment:    Reason Eval/Treat Not Completed: Other (comment). Pt with RN at bedside preparing to eat breakfast/recieve medication. Per pt and RN, pt to discharge today.  Lieutenant Diego PT, DPT 8:41 AM,07/09/20

## 2020-07-10 ENCOUNTER — Encounter: Payer: Self-pay | Admitting: Dermatology

## 2020-07-14 ENCOUNTER — Telehealth: Payer: Self-pay

## 2020-07-14 NOTE — Telephone Encounter (Signed)
Left message on voicemail to return my call.  

## 2020-07-14 NOTE — Telephone Encounter (Signed)
-----   Message from David C Kowalski, MD sent at 07/13/2020  6:15 PM EDT ----- °I do not know if he is still in hospital.  He should schedule appt here after discharge for psoriasis tx.  IF he is still in hospital, they should remove sutures at 1 week.  If discharged, we should see in office to remove sutures at about 1 week. °----- Message ----- °From: Mahum Betten A, CMA °Sent: 07/13/2020   4:53 PM EDT °To: David C Kowalski, MD ° °Do you want him to schedule follow up appointment for suture removal or is that being done elsewhere? Also, does he need to start any new medications for psoriasis? °----- Message ----- °From: Kowalski, David C, MD °Sent: 07/10/2020   1:56 PM EDT °To: Kowalski Clinical ° °1. Skin , left abdomen °GUTTATE PSORIASIS °2. Skin , left knee °PSORIASIFORM SPONGIOTIC DERMATITIS, SEE DESCRIPTION ° °1&2- most consistent with Psoriasis ° ° ° °

## 2020-07-21 ENCOUNTER — Other Ambulatory Visit: Payer: Self-pay

## 2020-07-21 ENCOUNTER — Ambulatory Visit: Payer: Medicare Other | Attending: Infectious Diseases | Admitting: Infectious Diseases

## 2020-07-21 DIAGNOSIS — J850 Gangrene and necrosis of lung: Secondary | ICD-10-CM

## 2020-07-21 NOTE — Progress Notes (Signed)
TELEPHONE VISIT The purpose of this virtual visit is to provide medical care while limiting exposure to the novel coronavirus (COVID19) for both patient and office staff.   Consent was obtained for phone visit:  Yes.   Answered questions that patient had about telehealth interaction:  Yes.   I discussed the limitations, risks, security and privacy concerns of performing an evaluation and management service by telephone. I also discussed with the patient that there may be a patient responsible charge related to this service. The patient expressed understanding and agreed to proceed.   [facility] Provider Location:office Follow up visit after recent hospitalization  Pt was in hospital between 06/18/20-07/09/20 For necrotizing left side pneumonia with underlying severe COPD and home oxygen He was living on his own and had pretty much neglected his health. Labs revealed WBC of 22.9, HB 14. PLT 474000, Na 134, K 3.4, BUN 20, cr 0.69 Sars cov2 was negative CXR showed left midlung opacity with cavitation CT chest showed chronic emphysema with left multilobar pneumonia and necrotizing pneumonia.  Started on vanco and cefepime and then ceftriaxone  and then changed to unasyn 9/17-9/20 ceftriaxone 9/20-9/28 Ampicillin/sulbactam 9/28-9/29 augmentin He developed a widespread macular papular erythematous rash which was thought to be due to antibiotic amoxicillin/ampicillin and on 9/29/21augmentin was stopped      It was discontinued and he started on Doxy/flagyl on 10/1/21after a day of being on 9/29-9/30 ceftriaxone+flagyl Pt got his first dose ofModerna vaccine on 06/30/20 ( rash was present before that) . He got Flu vaccine on 07/03/20 Pt also had a skin biopsy by Dermatologist on 07/08/20 PT was discharged to [place] on 07/09/20  Pt says he is feeling better Rash has resolved completely Just dry skin Has a new cold and some cough But  productive sputum is way less No  fever No diarrhea He is walking a bit- participation in PT His 2nd dose of Moderna is on 07/30/20 He has not made an appt with  [facility] pulmonologist since his discharge He is planning to Says his labs were drawn recently and he will ask his nurse to fax them to me- gave him my fax number  PT will continue Doxy and flagyl 08/06/20 He will have to see Dr.Aleskerov before that and get a repeat imaging Discussed the management with patient in great detail- follow PRN Spent 23  min with the patient on the phone

## 2020-07-29 ENCOUNTER — Telehealth: Payer: Self-pay

## 2020-07-29 NOTE — Telephone Encounter (Signed)
-----   Message from Ralene Bathe, MD sent at 07/13/2020  6:15 PM EDT ----- I do not know if he is still in hospital.  He should schedule appt here after discharge for psoriasis tx.  IF he is still in hospital, they should remove sutures at 1 week.  If discharged, we should see in office to remove sutures at about 1 week. ----- Message ----- From: Rosaria Ferries, CMA Sent: 07/13/2020   4:53 PM EDT To: Ralene Bathe, MD  Do you want him to schedule follow up appointment for suture removal or is that being done elsewhere? Also, does he need to start any new medications for psoriasis? ----- Message ----- From: Ralene Bathe, MD Sent: 07/10/2020   1:56 PM EDT To: Nehemiah Massed Clinical  1. Skin , left abdomen GUTTATE PSORIASIS 2. Skin , left knee PSORIASIFORM SPONGIOTIC DERMATITIS, SEE DESCRIPTION  1&2- most consistent with Psoriasis

## 2020-07-29 NOTE — Telephone Encounter (Signed)
Left message on voicemail to return my call.  

## 2020-08-03 NOTE — Telephone Encounter (Signed)
Patient has called our office twice today wanting BX results. I have called and left patient message to return our call.

## 2020-08-06 ENCOUNTER — Telehealth: Payer: Self-pay | Admitting: Infectious Diseases

## 2020-08-06 NOTE — Telephone Encounter (Signed)
Pt called and stated he has developed a new cough. He would like to know if Dr. Ramon Dredge could give him a call back to discuss what to do or if he needs to make an appt for a new ABX.

## 2020-08-07 ENCOUNTER — Other Ambulatory Visit: Payer: Self-pay

## 2020-08-07 ENCOUNTER — Telehealth: Payer: Self-pay

## 2020-08-07 MED ORDER — DOXYCYCLINE HYCLATE 100 MG PO TABS: 100.0000 mg | ORAL_TABLET | Freq: Two times a day (BID) | ORAL | 0 refills | Status: AC

## 2020-08-07 NOTE — Telephone Encounter (Signed)
Patient called stating he needs refills on Tussionex, Flagyl, and Doxycyline. Per MD okay to refill Doxycyline. Patient will need to see Pulmonology as directed in last note.  Per patient he is not able to schedule appointment until 11/16. Pulmonology unable to refill any medication until he is seen in office. No longer has a PCP and is currently looking for a new provider.  States symptoms are still the same since last visit. Would like refills for all three medications until he is seen. Will forward message to MD and CMA.  Refill for doxy.

## 2020-08-10 NOTE — Telephone Encounter (Signed)
Thanks for sending Doxy

## 2020-08-10 NOTE — Telephone Encounter (Signed)
Doxycycline was sent on Friday

## 2020-08-13 ENCOUNTER — Telehealth: Payer: Self-pay

## 2020-08-13 NOTE — Telephone Encounter (Signed)
-----   Message from Ralene Bathe, MD sent at 07/13/2020  6:15 PM EDT ----- I do not know if he is still in hospital.  He should schedule appt here after discharge for psoriasis tx.  IF he is still in hospital, they should remove sutures at 1 week.  If discharged, we should see in office to remove sutures at about 1 week. ----- Message ----- From: Rosaria Ferries, CMA Sent: 07/13/2020   4:53 PM EDT To: Ralene Bathe, MD  Do you want him to schedule follow up appointment for suture removal or is that being done elsewhere? Also, does he need to start any new medications for psoriasis? ----- Message ----- From: Ralene Bathe, MD Sent: 07/10/2020   1:56 PM EDT To: Nehemiah Massed Clinical  1. Skin , left abdomen GUTTATE PSORIASIS 2. Skin , left knee PSORIASIFORM SPONGIOTIC DERMATITIS, SEE DESCRIPTION  1&2- most consistent with Psoriasis

## 2020-08-13 NOTE — Telephone Encounter (Signed)
Patient informed of pathology results 

## 2020-08-14 ENCOUNTER — Other Ambulatory Visit: Payer: Self-pay | Admitting: Infectious Diseases

## 2020-08-14 MED ORDER — METRONIDAZOLE 500 MG PO TABS
500.0000 mg | ORAL_TABLET | Freq: Three times a day (TID) | ORAL | 0 refills | Status: DC
Start: 2020-08-14 — End: 2021-02-01

## 2020-09-22 ENCOUNTER — Telehealth: Payer: Self-pay | Admitting: Nurse Practitioner

## 2020-09-22 NOTE — Telephone Encounter (Signed)
Spoke with patient regarding Palliative referral/services and he was in agreement with scheduling visit with NP.  I have scheduled an In-person Consult for 09/23/20 @ 12 Noon.

## 2020-09-23 ENCOUNTER — Encounter: Payer: Self-pay | Admitting: Nurse Practitioner

## 2020-09-23 ENCOUNTER — Other Ambulatory Visit: Payer: Self-pay

## 2020-09-23 ENCOUNTER — Other Ambulatory Visit: Payer: Medicare Other | Admitting: Nurse Practitioner

## 2020-09-23 DIAGNOSIS — J9611 Chronic respiratory failure with hypoxia: Secondary | ICD-10-CM

## 2020-09-23 DIAGNOSIS — Z515 Encounter for palliative care: Secondary | ICD-10-CM

## 2020-09-23 NOTE — Progress Notes (Signed)
[facility] Consult Note Telephone: [phone]  Fax: [phone]  PATIENT NAME: Mark [name] DOB: 01-25-1951 MRN: [id number]  PRIMARY CARE PROVIDER:   Sandra Cockayne, MD  REFERRING PROVIDER:  Sandra Cockayne, MD [address] [place],  [place] [address]  RESPONSIBLE PARTY:   Self Primary contact number:  home:  [place] (sister) home: [phone]  cell: [phone]  I was asked by [name] to see Mr. Malecha for Palliative care consult for complex medical decision making  Due to the COVID-19 crisis, this visit was done via telemedicine from my office and it was initiated and consent by this patient and or family.  complex medical decision making. 1. Advance Care Planning; DNR but declines to allow to place in vynca, did not want to have a golden rod form in his home, he would think about it.  2. Goals of Care: Goals include to maximize quality of life and symptom management. Our advance care planning conversation included a discussion about:     The value and importance of advance care planning   Exploration of personal, cultural or spiritual beliefs that might influence medical decisions   Exploration of goals of care in the event of a sudden injury or illness   Identification and preparation of a healthcare agent   Review and updating or creation of an  advance directive document.  3. Palliative care encounter; Palliative care encounter; Palliative medicine team will continue to support patient, patient's family, and medical team. Visit consisted of counseling and education dealing with the complex and emotionally intense issues of symptom management and palliative care in the setting of serious and potentially life-threatening illness  4. f/u 4 weeks or sooner if declines  I spent 90 minutes providing this consultation,  Starting at 11:00am. More than 50% of the time in this  consultation was spent coordinating communication.   HISTORY OF PRESENT ILLNESS:  Mark [name] is a 69 y.o. year old male with multiple medical problems including COPD, pulmonary nodule 7 mm right lower lobe, mediastinal adenopathy,  history of necrotizing pneumonia, chronic respiratory failure with hypoxia, asthma, hypothyroidism, peripheral neuropathy, chronic back pain,  thoracic intervertebral disc displacement protrusion, osteoporosis, history of compression fractures, history of back surgery, restless leg syndrome, history of opiate use with pain medication agreement broken, history of opiate induced constipation, chronic pain syndrome, anxiety, adjustment disorder with anxious mood, depression, tobacco abuse, history of marijuana use. Hospitalize 9 / 03/2020 to 9 / 28 / 2021 for respiratory failure with hypoxia on continuous oxygen with severe COPD Progressive shortness of breath. Workup significant for chronic severe emphysema with superimpose left multilobar pneumonia suggestive of necrotizing pneumonia with multiple areas suspicious for cavitation and suspected lung abscess lateral to left hilum, antibiotics given. Hospitalized discharged on 10 / 7 / 2021 for necrotizing pneumonia maculopapular rash secondary to Beta lactams switched Augmentin antibiotics were changed to doxycycline and Flagyl, triamcinolone cream for rash. Also COPD with acute on chronic hypoxic hypercarbic respiratory failure. I called Mr Lamp to confirm [facility] palliative care visit. Mr Hoopes endorses family members have covid. We talked about changing in person appointment to telemedicine telephonic is video not available. Mr Thede in agreement. We talked about purpose of Palliative care visit. Mr Allnutt in agreement. We talked about how he is feeling today. Mr Magner endorses that he is having a slow day. He typically does sleep later in the day. We talked about recent hospitalization, past  medical history, chronic  disease progression. Mr Wilmeth talked at length about Life review including all the jobs he has had, all the work he has done and a very in-depth description of all of his medical problems. Mr Heber talked about resigning at home independently though it takes him quite some time to get test done. Mr Weinreb endorses that normally he is ambulatory, wears continuous oxygen. It takes him a long time to bath and dress. Mr Sentell endorses he does make simple things for himself to eat. We talked about the possibility of PCS Services to help as he has Medicaid in addition to Medicare. Mr Gerding was open but wanted to do more research about PCS service program. We talked about Mr Malena currently receiving home health services including therapy. Mr Willette talked at length about not being able to get to the pulmonology appointment with limited transportation. Mr Spittler also talked about and able to go to his primary care provider Dr Guinevere Ferrari. Mr Meckes endorses he is upset for not getting more than 30 days at a time of his Klonopin. Mr Kolbeck talked at length that he has been on Klonopin since the 1970s. Discuss with Mr. Cleland standard of practice is typically 30 days, no refill and routine visits. Mr Fussell endorses he requested Palliative to refill his Klonopin, Flexeril, Oxycodone. Discuss with Mr. Hartwig that would not be a task that would be doable for [facility] as previous history of previous behaviours with breaking pain contracts, alternative use of substances, questionable compliance and inability to do drug screens at this point. Mr Armor talked about symptoms of pain. Mr Kuenzel talked about shortness of breath. We talked about medical goals of care including aggressive versus conservative versus comfort care. Mr Hires endorses he is a DNR. We talked about having a golden rod form in the home. Mr. Altier endorses he does not have a Goldenrod form. Offered to complete, place in vynca,  mail though Mr Gentle endorses he preferred to wait. Mr. Osgood did not want to have a Goldenrod DNR form and the home out of concern for EMS not treating him. We talked at length about DNR scenarios. Mr Zanders endorses he prefers to wait. We talked about the work he has been doing with therapy. We talked about chronic disease progression of COPD. We talked about family dynamics his sister. We talked about Mr. Fluke limitation and ability to get to doctor's appointments. We talked about the option of Primary Provider in [facility] as an option. We talked about role of palliative care and plan of care. Discuss that will follow up and 4 weeks if needed or sooner should he declined. Mr Bily in agreement, appointments scheduled. Throughout Palliative care visit Mr Paternoster ruminated about his medications, refilling and extensive review  of his providers that treated him in the 48s. Therapeutic listening and emotional support provided. Contact information. Questions answered to satisfaction..   Palliative Care was asked to help address goals of care.   CODE STATUS: DNR  PPS: 50% HOSPICE ELIGIBILITY/DIAGNOSIS: TBD  PAST MEDICAL HISTORY:  Past Medical History:  Diagnosis Date  . Acute respiratory failure with hypoxia and hypercapnia (HCC) 05/01/2016  . Asthma   . COPD (chronic obstructive pulmonary disease) (HCC)   . Osteoporosis     SOCIAL HX:  Social History   Tobacco Use  . Smoking status: Former Games developer  . Smokeless tobacco: Never Used  Substance Use Topics  . Alcohol use: No    ALLERGIES:  Allergies  Allergen Reactions  . Augmentin [Amoxicillin-Pot Clavulanate] Rash    Maculopapular rash after receiving several days of amoxicillin/clavulanate despite tolerating week of ampicillin/sulbactam     PERTINENT MEDICATIONS:  Outpatient Encounter Medications as of 09/23/2020  Medication Sig  . albuterol (PROVENTIL HFA;VENTOLIN HFA) 108 (90 Base) MCG/ACT inhaler Inhale 2 puffs  into the lungs every 4 (four) hours as needed for wheezing or shortness of breath.   Marland Kitchen aspirin 81 MG tablet Take 81 mg by mouth daily.  . budesonide-formoterol (SYMBICORT) 160-4.5 MCG/ACT inhaler Inhale 2 puffs into the lungs 2 (two) times daily.  . clonazePAM (KLONOPIN) 1 MG tablet Take 1 tablet (1 mg total) by mouth 2 (two) times daily.  . cyclobenzaprine (FLEXERIL) 10 MG tablet Take 10 mg by mouth 3 (three) times daily as needed for muscle spasms.  Marland Kitchen dexamethasone (DECADRON) 4 MG tablet Take 1 tablet (4 mg total) by mouth daily.  . diphenhydrAMINE (BENADRYL) 25 mg capsule Take 25-50 mg by mouth every 6 (six) hours as needed for allergies.   Marland Kitchen EPINEPHrine (PRIMATENE MIST) 0.125 MG/ACT AERO Inhale 1 Dose into the lungs as needed (allergies).  . fluticasone (FLONASE) 50 MCG/ACT nasal spray Place 2 sprays into both nostrils daily as needed for allergies.   . hydrocerin (EUCERIN) CREA Apply 1 application topically 2 (two) times daily.  Marland Kitchen ibuprofen (ADVIL) 400 MG tablet Take 1 tablet (400 mg total) by mouth every 8 (eight) hours as needed for headache or moderate pain.  Marland Kitchen ipratropium (ATROVENT HFA) 17 MCG/ACT inhaler Inhale 2 puffs into the lungs every 6 (six) hours.  Marland Kitchen levothyroxine (SYNTHROID, LEVOTHROID) 25 MCG tablet Take 25 mcg by mouth daily before breakfast.  . metroNIDAZOLE (FLAGYL) 500 MG tablet Take 1 tablet (500 mg total) by mouth every 8 (eight) hours.  . Multiple Vitamin (MULTIVITAMIN WITH MINERALS) TABS tablet Take 1 tablet by mouth daily.  Marland Kitchen nystatin (MYCOSTATIN/NYSTOP) powder Apply topically 3 (three) times daily.  . Oxycodone HCl 10 MG TABS Take 1 tablet (10 mg total) by mouth every 6 (six) hours as needed.  . pantoprazole (PROTONIX) 40 MG tablet Take 1 tablet (40 mg total) by mouth daily.  . polyethylene glycol (MIRALAX / GLYCOLAX) 17 g packet Take 17 g by mouth daily as needed for moderate constipation.  . sucralfate (CARAFATE) 1 g tablet Take 1 tablet (1 g total) by mouth 4 (four)  times daily -  with meals and at bedtime.  Marland Kitchen tiotropium (SPIRIVA) 18 MCG inhalation capsule Place 18 mcg into inhaler and inhale daily.  . TRELEGY ELLIPTA 100-62.5-25 MCG/INH AEPB Inhale 1 puff into the lungs daily.  Marland Kitchen triamcinolone cream (KENALOG) 0.5 % Apply to rash chest and back bid   No facility-administered encounter medications on file as of 09/23/2020.    PHYSICAL EXAM:  Deferred  Gaje Tennyson Z Coburn Knaus, NP

## 2020-10-05 ENCOUNTER — Telehealth: Payer: Self-pay | Admitting: Nurse Practitioner

## 2020-10-05 NOTE — Telephone Encounter (Signed)
I attempted to call Mark [name] to reschedule f/u PC visit due to office being closed due to holiday, message left with contact information

## 2020-11-10 ENCOUNTER — Encounter: Payer: Self-pay | Admitting: Nurse Practitioner

## 2020-11-10 ENCOUNTER — Other Ambulatory Visit: Payer: Medicare Other | Admitting: Nurse Practitioner

## 2020-11-10 ENCOUNTER — Telehealth: Payer: Self-pay | Admitting: Nurse Practitioner

## 2020-11-10 ENCOUNTER — Other Ambulatory Visit: Payer: Self-pay

## 2020-11-10 DIAGNOSIS — J9611 Chronic respiratory failure with hypoxia: Secondary | ICD-10-CM

## 2020-11-10 DIAGNOSIS — Z515 Encounter for palliative care: Secondary | ICD-10-CM

## 2020-11-10 NOTE — Progress Notes (Signed)
Mark [name] Consult Note Telephone: [phone]  Fax: [phone]  PATIENT NAME: Mark [name] DOB: 1951/05/31 MRN: [id number]  PRIMARY CARE PROVIDER:   Sandra Cockayne, MD  REFERRING PROVIDER:  Sandra Cockayne, MD [address] [place],  [place] [address] RESPONSIBLE PARTY:   Self Primary contact number:  home:  [place] (sister) home: [phone]  cell: [phone]  Due to the COVID-19 crisis, this visit was done via telemedicine from my office and it was initiated and consent by this patient and or family.  1.Advance Care Planning; Changed to full code, will need further discussions as Mark [name] continues to change his mind back and forth as well as medical goals of care. It is very difficult to keep Mark [name] at conversation at hand as he jumps from one topic to another without resolution or decisions made.   2. Goals of Care: Goals include to maximize quality of life and symptom management. Our advance care planning conversation included a discussion about:   The value and importance of advance care planning  Exploration of personal, cultural or spiritual beliefs that might influence medical decisions  Exploration of goals of care in the event of a sudden injury or illness  Identification and preparation of a healthcare agent  Review and updating or creation of anadvance directive document.  3.Palliative care encounter; Palliative care encounter; Palliative medicine team will continue to support patient, patient's family, and medical team. Visit consisted of counseling and education dealing with the complex and emotionally intense issues of symptom management and palliative care in the setting of serious and potentially life-threatening illness  4. f/u 4 weeks or sooner if declines  I spent 50 minutes providing this consultation,  from 2:45pm to 3:35pm. More than 50% of  the time in this consultation was spent coordinating communication.   HISTORY OF PRESENT ILLNESS:  Trevar Boehringer is a 70 y.o. year old male with multiple medical problems including COPD, pulmonary nodule 7 mm right lower lobe, mediastinal adenopathy, history of necrotizing pneumonia, chronic respiratory failure with hypoxia, asthma, O2 dependence, hypothyroidism, peripheral neuropathy, chronic back pain, thoracic intervertebral disc displacement protrusion, osteoporosis, history of compression fractures, history of back surgery, restless leg syndrome, history of opiate use with pain medication agreement broken, history of opiate induced constipation, chronic pain syndrome, anxiety, adjustment disorder with anxious mood, depression, tobacco abuse, history of marijuana use. I called Mr [name] multiple times to confirm Palliative care follow up in person visit. I was able to connect with Mr [name] although at this time it was too late to make a in-person visit switch to telephone, telephone as video not available. We talked about purpose of Palliative care visit, services reviewed, discuss chronic disease management in addition to symptom management. We talked about prescribing including controlled substances for which that would not be a role Palliative care in Mark [name] follow up consult visits as it is not possible to do drug screens so we do have a controlled substance contract. Mr [name] from previous history has demonstrated alternative uses of substances including possibly currently and there is not any way to safely manage his medications without drug screens. Will continue to defer to Primary for these controlled medications. Mr [name] was in agreement. We talked about how Mr [name] was feeling today. Mr [name] endorses he is having a day where he is more tired. We talked about the physical therapist was working with him today. We talked about  the exercises he was doing with therapy. We  talked about Mr [name] walking with a cane and his home. We talked about the progress he has made with therapy as their visits have decreased to once a week. We talked about chronic disease progression of COPD, or two dependents. Mr [name] talked about how he put his oxygen lines together and was able to maneuver so he can get to most all parts of his home. We talked about how we cook this food. Mr [name] endorses he does get his food automatically delivered from [employer] or [employer]. Mr [name] endorses he does a lot of TV dinners and the microwave. Mr [name] endorses he does not feel like he is losing weight. We talked about shortness of breath for which he does get when he exerts himself. Mark [name] talked about using his inhalers. Mr [name] talked at length about when he was in the hospital, prior to that his experiences in [facility] with psychedelic drugs, he talked about his usage and experience with [employer]. Mr [name] went into great detail about the Psychedelic drugs he used. Mr [name] talked at length about the different work experiences he had. It is very difficult to keep Mr [name] on the task and topics at hand. We talked about medical goals of care. We talked about code status as Mr [name] endorses right now he wants to be a full code not a DNR. We talked about further discussing code status for which Mr [name] was open to that next Palliative follow-up visit in person. Stressed the importance of Mr [name] to answer the phone the day of the appointment so covid screening can be completed and appointment can be confirmed. We talked about adl's for which he does require some assistance. We talked about personal care service program under Medicaid for which he does have. Will contact Mark [name] office to see if they can complete the PCS form. Mr [name] did repeatedly asked what services Palliative care can provide for him, reviewed Palliative is a Doctor, general practice. Mr  [name] asked about Hospice prescribing him medication. We talked about Hospice, Medicare benefits and life expectancy of six months or less. Currently he is getting therapy. We discussed we can revisit hospice eligibility at the time of the next palliative visit for which therapy should be completed by that time. Mr Portilla in agreement. Appointment scheduled for follow-up Palliative care visit. Theapeutic listening and emotional support provided. Contact information. Questions answered to satisfaction.  Palliative Care was asked to help to continue to address goals of care.   CODE STATUS: Full code  PPS: 50% HOSPICE ELIGIBILITY/DIAGNOSIS: TBD  PAST MEDICAL HISTORY:  Past Medical History:  Diagnosis Date  . Acute respiratory failure with hypoxia and hypercapnia ([place]) 05/01/2016  . Asthma   . COPD (chronic obstructive pulmonary disease) ([place])   . Osteoporosis     SOCIAL HX:  Social History   Tobacco Use  . Smoking status: Former Research scientist (life sciences)  . Smokeless tobacco: Never Used  Substance Use Topics  . Alcohol use: No    ALLERGIES:  Allergies  Allergen Reactions  . Augmentin [Amoxicillin-Pot Clavulanate] Rash    Maculopapular rash after receiving several days of amoxicillin/clavulanate despite tolerating week of ampicillin/sulbactam     PERTINENT MEDICATIONS:  Outpatient Encounter Medications as of 11/10/2020  Medication Sig  . albuterol (PROVENTIL HFA;VENTOLIN HFA) 108 (90 Base) MCG/ACT inhaler Inhale 2 puffs into the lungs every 4 (four) hours as needed for wheezing or shortness of breath.   Marland Kitchen  aspirin 81 MG tablet Take 81 mg by mouth daily.  . budesonide-formoterol (SYMBICORT) 160-4.5 MCG/ACT inhaler Inhale 2 puffs into the lungs 2 (two) times daily.  . clonazePAM (KLONOPIN) 1 MG tablet Take 1 tablet (1 mg total) by mouth 2 (two) times daily.  . cyclobenzaprine (FLEXERIL) 10 MG tablet Take 10 mg by mouth 3 (three) times daily as needed for muscle spasms.  Marland Kitchen dexamethasone (DECADRON) 4  MG tablet Take 1 tablet (4 mg total) by mouth daily.  . diphenhydrAMINE (BENADRYL) 25 mg capsule Take 25-50 mg by mouth every 6 (six) hours as needed for allergies.   Marland Kitchen EPINEPHrine (PRIMATENE MIST) 0.125 MG/ACT AERO Inhale 1 Dose into the lungs as needed (allergies).  . fluticasone (FLONASE) 50 MCG/ACT nasal spray Place 2 sprays into both nostrils daily as needed for allergies.   . hydrocerin (EUCERIN) CREA Apply 1 application topically 2 (two) times daily.  Marland Kitchen ibuprofen (ADVIL) 400 MG tablet Take 1 tablet (400 mg total) by mouth every 8 (eight) hours as needed for headache or moderate pain.  Marland Kitchen ipratropium (ATROVENT HFA) 17 MCG/ACT inhaler Inhale 2 puffs into the lungs every 6 (six) hours.  Marland Kitchen levothyroxine (SYNTHROID, LEVOTHROID) 25 MCG tablet Take 25 mcg by mouth daily before breakfast.  . metroNIDAZOLE (FLAGYL) 500 MG tablet Take 1 tablet (500 mg total) by mouth every 8 (eight) hours.  . Multiple Vitamin (MULTIVITAMIN WITH MINERALS) TABS tablet Take 1 tablet by mouth daily.  Marland Kitchen nystatin (MYCOSTATIN/NYSTOP) powder Apply topically 3 (three) times daily.  . Oxycodone HCl 10 MG TABS Take 1 tablet (10 mg total) by mouth every 6 (six) hours as needed.  . pantoprazole (PROTONIX) 40 MG tablet Take 1 tablet (40 mg total) by mouth daily.  . polyethylene glycol (MIRALAX / GLYCOLAX) 17 g packet Take 17 g by mouth daily as needed for moderate constipation.  . sucralfate (CARAFATE) 1 g tablet Take 1 tablet (1 g total) by mouth 4 (four) times daily -  with meals and at bedtime.  Marland Kitchen tiotropium (SPIRIVA) 18 MCG inhalation capsule Place 18 mcg into inhaler and inhale daily.  . TRELEGY ELLIPTA 100-62.5-25 MCG/INH AEPB Inhale 1 puff into the lungs daily.  Marland Kitchen triamcinolone cream (KENALOG) 0.5 % Apply to rash chest and back bid   No facility-administered encounter medications on file as of 11/10/2020.    PHYSICAL EXAM:   Deferred  Quran Vasco Z Retaj Hilbun, NP

## 2020-11-10 NOTE — Telephone Encounter (Signed)
I called Mark [name] to confirm PC visit today and covid screen on home and cell phone, no answer, contact information provided with message to call back to confirm appointment

## 2020-11-26 ENCOUNTER — Telehealth: Payer: Self-pay | Admitting: Nurse Practitioner

## 2020-11-26 NOTE — Telephone Encounter (Signed)
Spoke with patient to see if we could reschedule the Palliative f/u visit from 12/14/20 to 12/23/20 @ 3 PM and he was in agreement with this.

## 2020-12-14 ENCOUNTER — Other Ambulatory Visit: Payer: Medicare Other | Admitting: Nurse Practitioner

## 2020-12-23 ENCOUNTER — Other Ambulatory Visit: Payer: Self-pay

## 2020-12-23 ENCOUNTER — Encounter: Payer: Self-pay | Admitting: Nurse Practitioner

## 2020-12-23 ENCOUNTER — Other Ambulatory Visit: Payer: Medicare Other | Admitting: Nurse Practitioner

## 2020-12-23 DIAGNOSIS — Z515 Encounter for palliative care: Secondary | ICD-10-CM

## 2020-12-23 DIAGNOSIS — J9611 Chronic respiratory failure with hypoxia: Secondary | ICD-10-CM

## 2020-12-23 DIAGNOSIS — J449 Chronic obstructive pulmonary disease, unspecified: Secondary | ICD-10-CM

## 2020-12-23 NOTE — Progress Notes (Signed)
[facility] Consult Note Telephone: [phone]  Fax: [phone]    Date of encounter: 12/23/20 PATIENT NAME: Mark [name] [id number]   [phone] (home)  DOB: [birth year] MRN: [id number]  PRIMARY CARE PROVIDER:    Sandra Cockayne, MD,  [address] [place] [address] [phone]  REFERRING PROVIDER:   Sandra Cockayne, MD [address] [place],  [place] [address] [phone]  RESPONSIBLE PARTY:    Contact Information    Name Relation Home Work Mobile   [place] Sister [phone]  [phone]   Chalmer, Zheng [phone]  [phone]     1.Advance Care Planning;made DNR; completed goldenrod; MOST form completed to say DNR; Limited interventions; No intubation; No feeding tube wishes are for IVF; antibiotics  Mark [name] in agreement with having Hospice Physicians review case for eligibility. We talked about role PC in POC. Discussed once Hospice Physician determines elgibility will recontact Mark [name] with decision and can either proceed with Hospice referral or schedule f/u PC visit.   2. Goals of Care: Goals include to maximize quality of life and symptom management. Our advance care planning conversation included a discussion about:   The value and importance of advance care planning  Exploration of personal, cultural or spiritual beliefs that might influence medical decisions  Exploration of goals of care in the event of a sudden injury or illness  Identification and preparation of a healthcare agent  Review and updating or creation of anadvance directive document.  3.Palliative care encounter; Palliative care encounter; Palliative medicine team will continue to support patient, patient's family, and medical team. Visit consisted of counseling and education dealing with the complex and emotionally intense issues  of symptom management and palliative care in the setting of serious and potentially life-threatening illness  4. COPD, continue supplemental O2; will restart duonebs; Rx: duonebs bid; #60; no RF sent to [facility] by escribe   I met face to face with patient facility. Palliative Care was asked to follow this patient by consultation request of  [place], Delcie Roch, MD continue to address advance care planning and complex medical decision making. This is the follow up palliative visit.   I spent 75 minutes providing this consultation,  from 3:00pm to 4:30pm.  More than 50% of the time in this consultation was spent in counseling and care coordination.  CODE STATUS: DNR  PPS: 40%  HOSPICE ELIGIBILITY/DIAGNOSIS: TBD  CHIEF COMPLAINT: Follow-up PC visit  HISTORY OF PRESENT ILLNESS:  Raidyn Wassink is a 70 y.o. year old male  With COPD, pulmonary nodule 7 mm right lower lobe, mediastinal adenopathy,history of necrotizing pneumonia, chronic respiratory failure with hypoxia, asthma, O2 dependence, hypothyroidism, peripheral neuropathy, chronic back pain,thoracic intervertebral disc displacement protrusion, osteoporosis, history of compression fractures, history of back surgery, restless leg syndrome, history of opiate use with pain medication agreement broken, history of opiate induced constipation, chronic pain syndrome, anxiety, adjustment disorder with anxious mood, depression, tobacco abuse, history of marijuana use. I called Mark [name] to confirm PC visit with negative covid screening. I visited and observed Mark [name] in his home. We talked about how he has been feeling. Mark [name] endorses he continues to feel generalized weakness, increase shortness of breath with exertion, desaturations with tachycardia. We talked about chronic disease progression of copd, o2 dependence. We talked about medications including restarting nebulizers. Rx: duonebs bid; #60; no RF sent to [facility] by  escribe We  talked multiple topics including his schooling, job experience, life experiences, family dynamics. We talked about pain. We talked about appetite, more difficult to cook for himself. We talked about medical goals of care. We talked about aggressive, conservative, comfort care. We talked about code status, we talked about scenarios. We talked about Mark [name] wishes are to be a DNR. Goldenrod form completed. Mark [name] showed copy in MOST form but does not have the original. MOST form completed. Placed in vynca. We talked about Medicare benefit of Hospice services. We talked about what services are provided. Mark [name] in agreement with having Hospice Physicians review case for eligibility. We talked about role PC in POC. Discussed once Hospice Physician determines elgibility will recontact Mark [name] with decision and can either proceed with Hospice referral or schedule f/u PC visit. Mark [name] in agreement. Therapeutic listening, emotional support provided. Questions answered to satisfaction.  CURRENT PROBLEM LIST:  Patient Active Problem List   Diagnosis Date Noted  . Cavitary pneumonia   . Acute on chronic respiratory failure with hypoxia and hypercapnia (HCC)   . Adjustment disorder with anxious mood 07/04/2020  . Maculopapular rash, generalized   . Right arm cellulitis   . COPD exacerbation ([place])   . Chronic respiratory failure with hypoxia ([place])   . Abscess of left lung with pneumonia ([place]) 06/19/2020  . Necrotizing pneumonia ([place]) 06/18/2020  . Chest pain 01/17/2019  . Hypothyroidism 01/17/2019  . Hypokalemia 01/10/2017  . Elevated C-reactive protein (CRP) 01/10/2017  . Elevated sed rate 01/10/2017  . Chronic hip pain (Location of Tertiary source of pain) (Bilateral) (L>R) 01/10/2017  . Chronic pain syndrome 10/24/2016  . Chronic back pain (Location of Primary Source of Pain) (Bilateral) (R>L) 10/24/2016  . Disturbance of skin sensation 05/05/2016  . Peripheral  neuropathy 05/05/2016  . Chronic knee pain (Bilateral) (L>R) 05/05/2016  . Chronic shoulder pain (Bilateral) (R>L) 05/05/2016  . Opioid-induced constipation (OIC) 05/05/2016  . Restless leg syndrome 05/05/2016  . Mediastinal adenopathy 05/05/2016  . Pulmonary nodule (7 mm right lower lobe) 05/05/2016  . Inguinal hernia without obstruction (Right) 05/05/2016  . 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 Spasm of back muscles 05/05/2016  . Chronic musculoskeletal pain 05/05/2016  . Neurogenic pain 05/05/2016  . Neuropathic pain 05/05/2016  . Sacral back pain (Location of Secondary source of pain) (Bilateral) (L>R) 05/05/2016  . Logorrhea 05/05/2016  . Chronic lower extremity pain (Bilateral) (L>R) 05/05/2016  . Chronic neck pain (posterior) (L>R) 05/05/2016  . 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 Cervical facet hypertrophy (C2-3) (Bilateral) (L>R) 05/05/2016  . Cervical facet syndrome (Bilateral) (L>R) 05/05/2016  . 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) 05/05/2016  . Compression fracture of L2 ([place]) (seen on 11/25/2014 x-ray) 05/05/2016  . Anemia, chronic disease 05/05/2016  . Lumbar facet syndrome (Bilateral) (R>L) 05/05/2016  . Opiate use (75 MME/Day) 03/14/2016  . Long term prescription opiate use 03/14/2016  . Long term current use of opiate analgesic 03/14/2016  . Long term prescription benzodiazepine use 03/14/2016  . History of marijuana use 03/14/2016  . Compression fractures (L4, L3, L1, T12 and T11) 06/26/2015  . Acquired atrophy of thyroid 06/26/2015  . OP (osteoporosis) 06/26/2015  . Other long term (current) drug therapy 07/12/2013  . Pain medication agreement broken 07/12/2013  . Clinical depression 01/20/2005  . Esophagitis, reflux 06/01/2004  . Current tobacco use 06/01/2004  . Anxiety 05/27/2004  . 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 Opiate use (75 MME/Day) 03/14/2016  . Long term prescription opiate use 03/14/2016  . Long term current use of opiate analgesic 03/14/2016  . Long term prescription benzodiazepine use 03/14/2016  . Anxiety 05/27/2004  . 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 Compression fractures (L4, L3, L1, T12 and T11) 06/26/2015  . Clinical depression 01/20/2005  . Acquired atrophy of thyroid 06/26/2015  . Other long term (current) drug therapy 07/12/2013  . OP (osteoporosis) 06/26/2015  . Esophagitis, reflux 06/01/2004  . Current tobacco use 06/01/2004  . History of marijuana use 03/14/2016  . Disturbance of skin sensation 05/05/2016  . Peripheral neuropathy 05/05/2016  . Chronic knee pain (Bilateral) (L>R) 05/05/2016  . Chronic shoulder pain (Bilateral) (R>L) 05/05/2016  . Opioid-induced constipation (OIC) 05/05/2016  . Restless leg syndrome 05/05/2016  . Mediastinal adenopathy 05/05/2016  . Pulmonary nodule (7 mm right lower lobe) 05/05/2016  . Inguinal hernia without obstruction (Right) 05/05/2016  . 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 Spasm of back muscles 05/05/2016  . Chronic musculoskeletal pain 05/05/2016  . Neurogenic pain 05/05/2016  . Neuropathic pain 05/05/2016  . Sacral back pain (Location of Secondary source of pain) (Bilateral) (L>R) 05/05/2016  . Logorrhea 05/05/2016  . Chronic lower extremity pain (Bilateral) (L>R) 05/05/2016  . Chronic neck pain (posterior) (L>R) 05/05/2016  . 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 Cervical facet hypertrophy (C2-3) (Bilateral) (L>R) 05/05/2016  . Cervical facet syndrome (Bilateral) (L>R) 05/05/2016  . 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) 05/05/2016  . Compression fracture  of L2 ([place]) (seen on 11/25/2014 x-ray) 05/05/2016  . Anemia, chronic disease 05/05/2016  . Lumbar facet syndrome (Bilateral) (R>L) 05/05/2016  . Pain medication agreement broken 07/12/2013  . Chronic pain syndrome 10/24/2016  . Chronic back pain (Location of Primary Source of Pain) (Bilateral) (R>L) 10/24/2016  . Hypokalemia 01/10/2017  . Elevated C-reactive protein (CRP) 01/10/2017  . Elevated sed rate 01/10/2017  . Chronic hip pain (Location of Tertiary source of pain) (Bilateral) (L>R) 01/10/2017  . Chest pain 01/17/2019  . Hypothyroidism 01/17/2019  . Necrotizing pneumonia ([place]) 06/18/2020  . Abscess of left lung with pneumonia ([place]) 06/19/2020  . Maculopapular rash, generalized   . Right arm cellulitis   . COPD exacerbation ([place])   . Chronic respiratory failure with hypoxia ([place])   . Adjustment disorder with anxious mood 07/04/2020  . Acute on chronic respiratory failure with hypoxia and hypercapnia (HCC)   . Cavitary pneumonia    Resolved Ambulatory Problems    Diagnosis Date Noted  . Acute respiratory failure with hypoxia and hypercapnia ([place]) 05/01/2016   Past Medical History:  Diagnosis Date  . Asthma   . COPD (chronic obstructive pulmonary disease) ([place])   . Osteoporosis    SOCIAL HX:  Social History   Tobacco Use  . Smoking status: Former Research scientist (life sciences)  . Smokeless tobacco: Never Used  Substance Use Topics  . Alcohol use: No   FAMILY HX:  Family History  Problem Relation Age of Onset  . Pulmonary fibrosis Mother     ALLERGIES:  Allergies  Allergen Reactions  . Augmentin [Amoxicillin-Pot Clavulanate] Rash    Maculopapular rash after receiving several days of amoxicillin/clavulanate despite tolerating week of ampicillin/sulbactam     PERTINENT MEDICATIONS:  Outpatient  Encounter Medications as of 12/23/2020  Medication Sig  . albuterol (PROVENTIL HFA;VENTOLIN HFA) 108 (90 Base) MCG/ACT inhaler Inhale 2 puffs into the lungs every 4 (four) hours as needed for  wheezing or shortness of breath.   Marland Kitchen aspirin 81 MG tablet Take 81 mg by mouth daily.  . budesonide-formoterol (SYMBICORT) 160-4.5 MCG/ACT inhaler Inhale 2 puffs into the lungs 2 (two) times daily.  . clonazePAM (KLONOPIN) 1 MG tablet Take 1 tablet (1 mg total) by mouth 2 (two) times daily.  . cyclobenzaprine (FLEXERIL) 10 MG tablet Take 10 mg by mouth 3 (three) times daily as needed for muscle spasms.  Marland Kitchen dexamethasone (DECADRON) 4 MG tablet Take 1 tablet (4 mg total) by mouth daily.  . diphenhydrAMINE (BENADRYL) 25 mg capsule Take 25-50 mg by mouth every 6 (six) hours as needed for allergies.   Marland Kitchen EPINEPHrine (PRIMATENE MIST) 0.125 MG/ACT AERO Inhale 1 Dose into the lungs as needed (allergies).  . fluticasone (FLONASE) 50 MCG/ACT nasal spray Place 2 sprays into both nostrils daily as needed for allergies.   . hydrocerin (EUCERIN) CREA Apply 1 application topically 2 (two) times daily.  Marland Kitchen ibuprofen (ADVIL) 400 MG tablet Take 1 tablet (400 mg total) by mouth every 8 (eight) hours as needed for headache or moderate pain.  Marland Kitchen ipratropium (ATROVENT HFA) 17 MCG/ACT inhaler Inhale 2 puffs into the lungs every 6 (six) hours.  Marland Kitchen levothyroxine (SYNTHROID, LEVOTHROID) 25 MCG tablet Take 25 mcg by mouth daily before breakfast.  . metroNIDAZOLE (FLAGYL) 500 MG tablet Take 1 tablet (500 mg total) by mouth every 8 (eight) hours.  . Multiple Vitamin (MULTIVITAMIN WITH MINERALS) TABS tablet Take 1 tablet by mouth daily.  Marland Kitchen nystatin (MYCOSTATIN/NYSTOP) powder Apply topically 3 (three) times daily.  . Oxycodone HCl 10 MG TABS Take 1 tablet (10 mg total) by mouth every 6 (six) hours as needed.  . pantoprazole (PROTONIX) 40 MG tablet Take 1 tablet (40 mg total) by mouth daily.  . polyethylene glycol (MIRALAX / GLYCOLAX) 17 g packet Take 17 g by mouth daily as needed for moderate constipation.  . sucralfate (CARAFATE) 1 g tablet Take 1 tablet (1 g total) by mouth 4 (four) times daily -  with meals and at bedtime.  Marland Kitchen  tiotropium (SPIRIVA) 18 MCG inhalation capsule Place 18 mcg into inhaler and inhale daily.  . TRELEGY ELLIPTA 100-62.5-25 MCG/INH AEPB Inhale 1 puff into the lungs daily.  Marland Kitchen triamcinolone cream (KENALOG) 0.5 % Apply to rash chest and back bid   No facility-administered encounter medications on file as of 12/23/2020.    ROS General: chronically ill EYES: denies vision changes, wears glasses ENMT: denies dysphagia Cardiovascular: denies chest pain Pulmonary: + cough, +increased SOB with exertion Abdomen: endorses poor appetite, endorses no constipation, endorses episodes of incontinence of bladder/bowel GU: denies dysuria, endorses continence of urine MSK:  no falls reported Skin: denies rashes or wounds Neurological: generalized weakness Psych: Endorses dampen mood Heme/lymph/immuno: denies bruises, abnormal bleeding  Physical Exam: Constitutional: HR 112; o2 sats 93%, respirations 22 General: frail appearing, chronically ill, debilitated EYES: lids intact, no discharge  ENMT: decrease hearing,oral mucous membranes moist, poor dentition intact CV: S1S2, RRR, mild BLE edema Pulmonary: decrease bases, few wheezes,O2 supplement Abdomen: intake 25-50%,  soft and non tender, GU: deferred MSK:  no contractures of LE, ambulatory with cane Skin: warm and dry, no rashes or wounds on visible skin Neuro: Generalized weakness, mild cognitive impairment Psych: non-anxious affect, A and O Hem/lymph/immuno: no widespread bruising  30 minutes ACP 45 minutes visit  Thank you for the opportunity to participate in the care of Mark [name].  The palliative care team will continue to follow. Please call our office at [phone] if we can be of additional assistance.   *This note was dictated using voice recognition software.  Despite best efforts to proofread, errors can occur which can change the meaning.  Any change was purely unintentional.  Christin Z Gusler, NP , DNP, MPH, AGPCNP-BC,  ACHPN

## 2021-01-14 ENCOUNTER — Telehealth: Payer: Self-pay

## 2021-01-14 NOTE — Telephone Encounter (Signed)
Patient returned call. He is doing okay but recently admitted into Hospice Care. He did have stitches removed back in October. He does not need follow up at this time.

## 2021-01-14 NOTE — Telephone Encounter (Signed)
Left message on voice mail for patient to return my call.

## 2021-01-14 NOTE — Telephone Encounter (Signed)
-----   Message from Ralene Bathe, MD sent at 01/13/2021  5:32 PM EDT ----- Can you contact pt to see if he needs further evaluation for his biopsy proven Psoriasis?  Also, he did not come for his 1 week follow up at our office scheduled for October 2021 after hospital discharge.  I assume he got those stitches out? ----- Message ----- From: Rosaria Ferries, CMA Sent: 07/13/2020   4:54 PM EDT To: Ralene Bathe, MD  Do you want him to schedule follow up appointment for suture removal or is that being done elsewhere? Also, does he need to start any new medications for psoriasis? ----- Message ----- From: Ralene Bathe, MD Sent: 07/10/2020   1:56 PM EDT To: [facility]  1. Skin , left abdomen GUTTATE PSORIASIS 2. Skin , left knee PSORIASIFORM SPONGIOTIC DERMATITIS, SEE DESCRIPTION  1&2- most consistent with Psoriasis

## 2021-01-25 ENCOUNTER — Encounter: Payer: Self-pay | Admitting: *Deleted

## 2021-01-25 ENCOUNTER — Other Ambulatory Visit: Payer: Self-pay

## 2021-01-25 ENCOUNTER — Emergency Department

## 2021-01-25 ENCOUNTER — Inpatient Hospital Stay
Admission: EM | Admit: 2021-01-25 | Discharge: 2021-02-01 | DRG: 193 | Disposition: A | Discharge status: Hospice | Attending: Internal Medicine | Admitting: Internal Medicine

## 2021-01-25 DIAGNOSIS — A419 Sepsis, unspecified organism: Secondary | ICD-10-CM | POA: Diagnosis not present

## 2021-01-25 DIAGNOSIS — G2581 Restless legs syndrome: Secondary | ICD-10-CM | POA: Diagnosis present

## 2021-01-25 DIAGNOSIS — Z79899 Other long term (current) drug therapy: Secondary | ICD-10-CM | POA: Diagnosis not present

## 2021-01-25 DIAGNOSIS — I429 Cardiomyopathy, unspecified: Secondary | ICD-10-CM | POA: Diagnosis present

## 2021-01-25 DIAGNOSIS — R339 Retention of urine, unspecified: Secondary | ICD-10-CM | POA: Diagnosis not present

## 2021-01-25 DIAGNOSIS — E8770 Fluid overload, unspecified: Secondary | ICD-10-CM | POA: Diagnosis present

## 2021-01-25 DIAGNOSIS — R0602 Shortness of breath: Secondary | ICD-10-CM | POA: Diagnosis not present

## 2021-01-25 DIAGNOSIS — R627 Adult failure to thrive: Secondary | ICD-10-CM | POA: Diagnosis present

## 2021-01-25 DIAGNOSIS — Z87891 Personal history of nicotine dependence: Secondary | ICD-10-CM | POA: Diagnosis not present

## 2021-01-25 DIAGNOSIS — Z7982 Long term (current) use of aspirin: Secondary | ICD-10-CM

## 2021-01-25 DIAGNOSIS — I2781 Cor pulmonale (chronic): Secondary | ICD-10-CM | POA: Diagnosis not present

## 2021-01-25 DIAGNOSIS — K219 Gastro-esophageal reflux disease without esophagitis: Secondary | ICD-10-CM | POA: Diagnosis present

## 2021-01-25 DIAGNOSIS — Z79890 Hormone replacement therapy (postmenopausal): Secondary | ICD-10-CM | POA: Diagnosis not present

## 2021-01-25 DIAGNOSIS — Z881 Allergy status to other antibiotic agents status: Secondary | ICD-10-CM

## 2021-01-25 DIAGNOSIS — J439 Emphysema, unspecified: Secondary | ICD-10-CM | POA: Diagnosis present

## 2021-01-25 DIAGNOSIS — J9621 Acute and chronic respiratory failure with hypoxia: Secondary | ICD-10-CM | POA: Diagnosis present

## 2021-01-25 DIAGNOSIS — Z66 Do not resuscitate: Secondary | ICD-10-CM | POA: Diagnosis present

## 2021-01-25 DIAGNOSIS — J440 Chronic obstructive pulmonary disease with acute lower respiratory infection: Secondary | ICD-10-CM | POA: Diagnosis not present

## 2021-01-25 DIAGNOSIS — T380X5A Adverse effect of glucocorticoids and synthetic analogues, initial encounter: Secondary | ICD-10-CM | POA: Diagnosis not present

## 2021-01-25 DIAGNOSIS — R7989 Other specified abnormal findings of blood chemistry: Secondary | ICD-10-CM

## 2021-01-25 DIAGNOSIS — D649 Anemia, unspecified: Secondary | ICD-10-CM

## 2021-01-25 DIAGNOSIS — I248 Other forms of acute ischemic heart disease: Secondary | ICD-10-CM | POA: Diagnosis present

## 2021-01-25 DIAGNOSIS — F411 Generalized anxiety disorder: Secondary | ICD-10-CM | POA: Diagnosis present

## 2021-01-25 DIAGNOSIS — E039 Hypothyroidism, unspecified: Secondary | ICD-10-CM | POA: Diagnosis present

## 2021-01-25 DIAGNOSIS — R778 Other specified abnormalities of plasma proteins: Secondary | ICD-10-CM

## 2021-01-25 DIAGNOSIS — M810 Age-related osteoporosis without current pathological fracture: Secondary | ICD-10-CM | POA: Diagnosis present

## 2021-01-25 DIAGNOSIS — J189 Pneumonia, unspecified organism: Principal | ICD-10-CM | POA: Diagnosis present

## 2021-01-25 DIAGNOSIS — G894 Chronic pain syndrome: Secondary | ICD-10-CM | POA: Diagnosis present

## 2021-01-25 DIAGNOSIS — J441 Chronic obstructive pulmonary disease with (acute) exacerbation: Secondary | ICD-10-CM | POA: Diagnosis not present

## 2021-01-25 DIAGNOSIS — Z7951 Long term (current) use of inhaled steroids: Secondary | ICD-10-CM | POA: Diagnosis not present

## 2021-01-25 DIAGNOSIS — J209 Acute bronchitis, unspecified: Secondary | ICD-10-CM | POA: Diagnosis not present

## 2021-01-25 DIAGNOSIS — I5030 Unspecified diastolic (congestive) heart failure: Secondary | ICD-10-CM | POA: Diagnosis not present

## 2021-01-25 DIAGNOSIS — J9611 Chronic respiratory failure with hypoxia: Secondary | ICD-10-CM | POA: Diagnosis not present

## 2021-01-25 DIAGNOSIS — Z9981 Dependence on supplemental oxygen: Secondary | ICD-10-CM | POA: Diagnosis not present

## 2021-01-25 DIAGNOSIS — Z20822 Contact with and (suspected) exposure to covid-19: Secondary | ICD-10-CM | POA: Diagnosis present

## 2021-01-25 LAB — COMPREHENSIVE METABOLIC PANEL
ALT: 13 U/L (ref 0–44)
AST: 28 U/L (ref 15–41)
Albumin: 3.8 g/dL (ref 3.5–5.0)
Alkaline Phosphatase: 62 U/L (ref 38–126)
Anion gap: 12 (ref 5–15)
BUN: 29 mg/dL — ABNORMAL HIGH (ref 8–23)
CO2: 31 mmol/L (ref 22–32)
Calcium: 9.3 mg/dL (ref 8.9–10.3)
Chloride: 98 mmol/L (ref 98–111)
Creatinine, Ser: 0.94 mg/dL (ref 0.61–1.24)
GFR, Estimated: >60 mL/min (ref >60)
Glucose, Bld: 128 mg/dL — ABNORMAL HIGH (ref 70–99)
Potassium: 3.8 mmol/L (ref 3.5–5.1)
Sodium: 141 mmol/L (ref 135–145)
Total Bilirubin: 0.7 mg/dL (ref 0.3–1.2)
Total Protein: 6.5 g/dL (ref 6.5–8.1)

## 2021-01-25 LAB — MAGNESIUM: Magnesium: 2.1 mg/dL (ref 1.7–2.4)

## 2021-01-25 LAB — CBC WITH DIFFERENTIAL/PLATELET
Abs Immature Granulocytes: 0.03 10*3/uL (ref 0.00–0.07)
Basophils Absolute: 0.1 10*3/uL (ref 0.0–0.1)
Basophils Relative: 1 %
Eosinophils Absolute: 0.4 10*3/uL (ref 0.0–0.5)
Eosinophils Relative: 4 %
HCT: 37.8 % — ABNORMAL LOW (ref 39.0–52.0)
Hemoglobin: 11.8 g/dL — ABNORMAL LOW (ref 13.0–17.0)
Immature Granulocytes: 0 %
Lymphocytes Relative: 22 %
Lymphs Abs: 2.3 10*3/uL (ref 0.7–4.0)
MCH: 29.6 pg (ref 26.0–34.0)
MCHC: 31.2 g/dL (ref 30.0–36.0)
MCV: 95 fL (ref 80.0–100.0)
Monocytes Absolute: 1.6 10*3/uL — ABNORMAL HIGH (ref 0.1–1.0)
Monocytes Relative: 15 %
Neutro Abs: 6.2 10*3/uL (ref 1.7–7.7)
Neutrophils Relative %: 58 %
Platelets: 247 10*3/uL (ref 150–400)
RBC: 3.98 MIL/uL — ABNORMAL LOW (ref 4.22–5.81)
RDW: 12.7 % (ref 11.5–15.5)
WBC: 10.6 10*3/uL — ABNORMAL HIGH (ref 4.0–10.5)
nRBC: 0 % (ref 0.0–0.2)

## 2021-01-25 LAB — TROPONIN I (HIGH SENSITIVITY)
Troponin I (High Sensitivity): 27 ng/L — ABNORMAL HIGH (ref <18)
Troponin I (High Sensitivity): 28 ng/L — ABNORMAL HIGH (ref <18)

## 2021-01-25 LAB — RESP PANEL BY RT-PCR (FLU A&B, COVID) ARPGX2
Influenza A by PCR: NEGATIVE
Influenza B by PCR: NEGATIVE
SARS Coronavirus 2 by RT PCR: NEGATIVE

## 2021-01-25 LAB — BRAIN NATRIURETIC PEPTIDE: B Natriuretic Peptide: 379 pg/mL — ABNORMAL HIGH (ref 0.0–100.0)

## 2021-01-25 LAB — PROCALCITONIN: Procalcitonin: <0.1 ng/mL

## 2021-01-25 MED ORDER — LEVOTHYROXINE SODIUM 50 MCG PO TABS
25.0000 ug | ORAL_TABLET | Freq: Every day | ORAL | Status: DC
  Administered 2021-01-26 – 2021-02-01 (×7): 25 ug via ORAL
  Filled 2021-01-25 (×7): qty 1

## 2021-01-25 MED ORDER — CYCLOBENZAPRINE HCL 10 MG PO TABS
10.0000 mg | ORAL_TABLET | Freq: Three times a day (TID) | ORAL | Status: DC | PRN
  Administered 2021-01-27 – 2021-02-01 (×5): 10 mg via ORAL
  Filled 2021-01-25 (×5): qty 1

## 2021-01-25 MED ORDER — DM-GUAIFENESIN ER 30-600 MG PO TB12
1.0000 | ORAL_TABLET | Freq: Two times a day (BID) | ORAL | Status: DC
  Administered 2021-01-25 – 2021-01-29 (×6): 1 via ORAL
  Filled 2021-01-25 (×8): qty 1

## 2021-01-25 MED ORDER — SUCRALFATE 1 G PO TABS
1.0000 g | ORAL_TABLET | Freq: Three times a day (TID) | ORAL | Status: DC
  Administered 2021-01-26 – 2021-02-01 (×25): 1 g via ORAL
  Filled 2021-01-25 (×25): qty 1

## 2021-01-25 MED ORDER — PANTOPRAZOLE SODIUM 40 MG PO TBEC
40.0000 mg | DELAYED_RELEASE_TABLET | Freq: Every day | ORAL | Status: DC
  Administered 2021-01-26 – 2021-02-01 (×6): 40 mg via ORAL
  Filled 2021-01-25 (×7): qty 1

## 2021-01-25 MED ORDER — SODIUM CHLORIDE 0.9 % IV SOLN
2.0000 g | INTRAVENOUS | Status: DC
  Administered 2021-01-25 – 2021-01-26 (×2): 2 g via INTRAVENOUS
  Filled 2021-01-25 (×2): qty 20
  Filled 2021-01-25: qty 2

## 2021-01-25 MED ORDER — CLONAZEPAM 1 MG PO TABS
1.0000 mg | ORAL_TABLET | Freq: Two times a day (BID) | ORAL | Status: DC
  Administered 2021-01-26 – 2021-02-01 (×13): 1 mg via ORAL
  Filled 2021-01-25 (×8): qty 1
  Filled 2021-01-25: qty 2
  Filled 2021-01-25: qty 1
  Filled 2021-01-25: qty 2
  Filled 2021-01-25 (×2): qty 1

## 2021-01-25 MED ORDER — IPRATROPIUM-ALBUTEROL 0.5-2.5 (3) MG/3ML IN SOLN
9.0000 mL | Freq: Once | RESPIRATORY_TRACT | Status: AC
  Administered 2021-01-25: 9 mL via RESPIRATORY_TRACT
  Filled 2021-01-25: qty 9

## 2021-01-25 MED ORDER — ONDANSETRON HCL 4 MG PO TABS: 4.0000 mg | ORAL_TABLET | Freq: Four times a day (QID) | ORAL | Status: DC | PRN

## 2021-01-25 MED ORDER — FLUTICASONE PROPIONATE 50 MCG/ACT NA SUSP
2.0000 | Freq: Every day | NASAL | Status: DC | PRN
  Filled 2021-01-25: qty 16

## 2021-01-25 MED ORDER — DM-GUAIFENESIN ER 30-600 MG PO TB12
1.0000 | ORAL_TABLET | Freq: Two times a day (BID) | ORAL | Status: DC | PRN
  Administered 2021-01-26: 1 via ORAL

## 2021-01-25 MED ORDER — SODIUM CHLORIDE 0.9 % IV SOLN: 1.0000 g | Freq: Once | INTRAVENOUS | Status: DC

## 2021-01-25 MED ORDER — PREDNISONE 20 MG PO TABS
40.0000 mg | ORAL_TABLET | Freq: Every day | ORAL | Status: AC
  Administered 2021-01-27 – 2021-01-30 (×4): 40 mg via ORAL
  Filled 2021-01-25 (×4): qty 2

## 2021-01-25 MED ORDER — SODIUM CHLORIDE 0.9 % IV SOLN: 2.0000 g | Freq: Once | INTRAVENOUS | Status: DC

## 2021-01-25 MED ORDER — ENOXAPARIN SODIUM 60 MG/0.6ML ~~LOC~~ SOLN
50.0000 mg | SUBCUTANEOUS | Status: DC
  Administered 2021-01-29 – 2021-01-31 (×2): 50 mg via SUBCUTANEOUS
  Filled 2021-01-25 (×5): qty 0.6

## 2021-01-25 MED ORDER — ADULT MULTIVITAMIN W/MINERALS CH
1.0000 | ORAL_TABLET | Freq: Every day | ORAL | Status: DC
  Administered 2021-01-26 – 2021-02-01 (×7): 1 via ORAL
  Filled 2021-01-25 (×7): qty 1

## 2021-01-25 MED ORDER — IPRATROPIUM-ALBUTEROL 0.5-2.5 (3) MG/3ML IN SOLN
3.0000 mL | RESPIRATORY_TRACT | Status: DC | PRN
  Administered 2021-01-26 – 2021-01-27 (×2): 3 mL via RESPIRATORY_TRACT
  Filled 2021-01-25 (×2): qty 3

## 2021-01-25 MED ORDER — ACETAMINOPHEN 325 MG PO TABS
650.0000 mg | ORAL_TABLET | Freq: Four times a day (QID) | ORAL | Status: DC | PRN
  Filled 2021-01-25: qty 2

## 2021-01-25 MED ORDER — SODIUM CHLORIDE 0.9 % IV SOLN
500.0000 mg | INTRAVENOUS | Status: DC
  Administered 2021-01-26 (×2): 500 mg via INTRAVENOUS
  Filled 2021-01-25 (×3): qty 500

## 2021-01-25 MED ORDER — ONDANSETRON HCL 4 MG/2ML IJ SOLN: 4.0000 mg | Freq: Four times a day (QID) | INTRAMUSCULAR | Status: DC | PRN

## 2021-01-25 MED ORDER — TIOTROPIUM BROMIDE MONOHYDRATE 18 MCG IN CAPS
18.0000 ug | ORAL_CAPSULE | Freq: Every day | RESPIRATORY_TRACT | Status: DC
  Administered 2021-01-26 – 2021-02-01 (×7): 18 ug via RESPIRATORY_TRACT
  Filled 2021-01-25 (×2): qty 5

## 2021-01-25 MED ORDER — TRAZODONE HCL 50 MG PO TABS
25.0000 mg | ORAL_TABLET | Freq: Every evening | ORAL | Status: DC | PRN
  Administered 2021-01-26 – 2021-01-30 (×2): 25 mg via ORAL
  Filled 2021-01-25 (×2): qty 1

## 2021-01-25 MED ORDER — DOXYCYCLINE HYCLATE 100 MG PO TABS: 100.0000 mg | ORAL_TABLET | Freq: Once | ORAL | Status: DC

## 2021-01-25 MED ORDER — MAGNESIUM HYDROXIDE 400 MG/5ML PO SUSP: 30.0000 mL | Freq: Every day | ORAL | Status: DC | PRN

## 2021-01-25 MED ORDER — SODIUM CHLORIDE 0.9 % IV SOLN
INTRAVENOUS | Status: DC
  Administered 2021-01-25: 100 mL via INTRAVENOUS

## 2021-01-25 MED ORDER — ACETAMINOPHEN 650 MG RE SUPP: 650.0000 mg | Freq: Four times a day (QID) | RECTAL | Status: DC | PRN

## 2021-01-25 MED ORDER — DIPHENHYDRAMINE HCL 25 MG PO CAPS: 25.0000 mg | ORAL_CAPSULE | Freq: Four times a day (QID) | ORAL | Status: DC | PRN

## 2021-01-25 MED ORDER — IOHEXOL 350 MG/ML SOLN
75.0000 mL | Freq: Once | INTRAVENOUS | Status: AC | PRN
  Administered 2021-01-25: 75 mL via INTRAVENOUS
  Filled 2021-01-25: qty 75

## 2021-01-25 MED ORDER — ASPIRIN EC 81 MG PO TBEC
81.0000 mg | DELAYED_RELEASE_TABLET | Freq: Every day | ORAL | Status: DC
  Administered 2021-01-26 – 2021-02-01 (×6): 81 mg via ORAL
  Filled 2021-01-25 (×6): qty 1

## 2021-01-25 MED ORDER — EPINEPHRINE 0.125 MG/ACT IN AERO: 1.0000 | INHALATION_SPRAY | RESPIRATORY_TRACT | Status: DC | PRN

## 2021-01-25 MED ORDER — OXYCODONE HCL 5 MG PO TABS
10.0000 mg | ORAL_TABLET | Freq: Four times a day (QID) | ORAL | Status: DC | PRN
  Administered 2021-01-26 – 2021-01-27 (×3): 10 mg via ORAL
  Filled 2021-01-25 (×3): qty 2

## 2021-01-25 MED ORDER — METHYLPREDNISOLONE SODIUM SUCC 40 MG IJ SOLR
40.0000 mg | Freq: Four times a day (QID) | INTRAMUSCULAR | Status: AC
Start: 2021-01-25 — End: 2021-01-26
  Administered 2021-01-25 – 2021-01-26 (×4): 40 mg via INTRAVENOUS
  Filled 2021-01-25 (×4): qty 1

## 2021-01-25 MED ORDER — POLYETHYLENE GLYCOL 3350 17 G PO PACK: 17.0000 g | PACK | Freq: Every day | ORAL | Status: DC | PRN

## 2021-01-25 NOTE — ED Notes (Signed)
Pt brought in v ia ems from home with sob.  Pt reports starting steroids last week.  Pt is on 4 liters oxygen.  Pt has a cough.  Former smoker.  No fever.  No chest pain.  Denies n/v/d    Iv in place.  Ems gave breathing treatment and iv 125mg  solumedrol

## 2021-01-25 NOTE — ED Notes (Signed)
Report off to laura rn  

## 2021-01-25 NOTE — ED Provider Notes (Signed)
[facility] Emergency Department Provider Note  ____________________________________________   Event Date/Time   First MD Initiated Contact with Patient 01/25/21 1817     (approximate)  I have reviewed the triage vital signs and the nursing notes.   HISTORY  Chief Complaint Shortness of Breath   HPI Mark [name] is a 70 y.o. male  with a PMH of COPD, chronic hypoxic resp failure on 4L [place] at baseline, necrotizing pneumonia,hypothyroidism, peripheral neuropathy, chronic back pain,thoracic intervertebral disc displacement protrusion, osteoporosis, compression fractures, RLS, chronic pain syndrome, anxiety, adjustment disorder with anxious mood, depression, tobacco abuse, history of marijuana use and recent diagnosis of COPD exacerbation via televisit on 4/21 started on 5 days of azithromycin and prednisone who presents for assessment of worsening shortness of breath cough and chest tightness over the last couple of weeks much worse today than the last couple days.  Is endorses increased cough from baseline.  No hemoptysis.  States he feels little lightheaded and has been had some difficulty ambulating around his apartment which uses a walker to be.  No recent falls or injuries, vomiting, diarrhea, dysuria, [place] pain, back pain or acute extremity pain although he does note he has some chronic swelling and discoloration of his legs.  No other acute concerns at this time.  He states that he is not sure if he should be in hospice as he was never seen by a physician only evaluated by an NP who refused to order a CT last week although he is worried he may have a pneumonia or abscess in his lungs that could potentially reversible and significantly improved how he is feeling.  He was given 125 mg of Solu-Medrol via EMS.         Past Medical History:  Diagnosis Date  . Acute respiratory failure with hypoxia and hypercapnia ([place]) 05/01/2016  . Asthma   . COPD  (chronic obstructive pulmonary disease) ([place])   . Osteoporosis     Patient Active Problem List   Diagnosis Date Noted  . CAP (community acquired pneumonia) 01/25/2021  . Cavitary pneumonia   . Acute on chronic respiratory failure with hypoxia and hypercapnia (HCC)   . Adjustment disorder with anxious mood 07/04/2020  . Maculopapular rash, generalized   . Right arm cellulitis   . COPD exacerbation ([place])   . Chronic respiratory failure with hypoxia ([place])   . Abscess of left lung with pneumonia ([place]) 06/19/2020  . Necrotizing pneumonia ([place]) 06/18/2020  . Chest pain 01/17/2019  . Hypothyroidism 01/17/2019  . Hypokalemia 01/10/2017  . Elevated C-reactive protein (CRP) 01/10/2017  . Elevated sed rate 01/10/2017  . Chronic hip pain (Location of Tertiary source of pain) (Bilateral) (L>R) 01/10/2017  . Chronic pain syndrome 10/24/2016  . Chronic back pain (Location of Primary Source of Pain) (Bilateral) (R>L) 10/24/2016  . Disturbance of skin sensation 05/05/2016  . Peripheral neuropathy 05/05/2016  . Chronic knee pain (Bilateral) (L>R) 05/05/2016  . Chronic shoulder pain (Bilateral) (R>L) 05/05/2016  . Opioid-induced constipation (OIC) 05/05/2016  . Restless leg syndrome 05/05/2016  . Mediastinal adenopathy 05/05/2016  . Pulmonary nodule (7 mm right lower lobe) 05/05/2016  . Inguinal hernia without obstruction (Right) 05/05/2016  . 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 Spasm of back muscles 05/05/2016  . Chronic musculoskeletal pain 05/05/2016  . Neurogenic pain 05/05/2016  . Neuropathic pain 05/05/2016  . Sacral back pain (Location of Secondary source of pain) (Bilateral) (L>R) 05/05/2016  .  Logorrhea 05/05/2016  . Chronic lower extremity pain (Bilateral) (L>R) 05/05/2016  . Chronic neck pain (posterior) (L>R) 05/05/2016  . 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 Cervical facet hypertrophy (C2-3)  (Bilateral) (L>R) 05/05/2016  . Cervical facet syndrome (Bilateral) (L>R) 05/05/2016  . 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) 05/05/2016  . Compression fracture of L2 ([place]) (seen on 11/25/2014 x-ray) 05/05/2016  . Anemia, chronic disease 05/05/2016  . Lumbar facet syndrome (Bilateral) (R>L) 05/05/2016  . Opiate use (75 MME/Day) 03/14/2016  . Long term prescription opiate use 03/14/2016  . Long term current use of opiate analgesic 03/14/2016  . Long term prescription benzodiazepine use 03/14/2016  . History of marijuana use 03/14/2016  . Compression fractures (L4, L3, L1, T12 and T11) 06/26/2015  . Acquired atrophy of thyroid 06/26/2015  . OP (osteoporosis) 06/26/2015  . Other long term (current) drug therapy 07/12/2013  . Pain medication agreement broken 07/12/2013  . Clinical depression 01/20/2005  . Esophagitis, reflux 06/01/2004  . Current tobacco use 06/01/2004  . Anxiety 05/27/2004  . 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 BACK SURGERY      Prior to Admission medications   Medication Sig Start Date End Date Taking? Authorizing Provider  albuterol (PROVENTIL HFA;VENTOLIN HFA) 108 (90 Base) MCG/ACT inhaler Inhale 2 puffs into the lungs every 4 (four) hours as needed for wheezing or shortness of breath.     [provider]  aspirin 81 MG tablet Take 81 mg by mouth daily.    [provider]  budesonide-formoterol (SYMBICORT) 160-4.5 MCG/ACT inhaler Inhale 2 puffs into the lungs 2 (two) times daily.    [provider]  clonazePAM (KLONOPIN) 1 MG tablet Take 1 tablet (1 mg total) by mouth 2 (two) times daily. 07/03/20   Loletha Grayer, MD  cyclobenzaprine (FLEXERIL) 10 MG tablet Take 10 mg by mouth 3 (three) times daily as needed for muscle spasms.    [provider]  dexamethasone (DECADRON) 4 MG tablet Take 1 tablet  (4 mg total) by mouth daily. 07/06/20   Loletha Grayer, MD  diphenhydrAMINE (BENADRYL) 25 mg capsule Take 25-50 mg by mouth every 6 (six) hours as needed for allergies.     [provider]  EPINEPHrine (PRIMATENE MIST) 0.125 MG/ACT AERO Inhale 1 Dose into the lungs as needed (allergies).    [provider]  fluticasone (FLONASE) 50 MCG/ACT nasal spray Place 2 sprays into both nostrils daily as needed for allergies.  05/29/20   [provider]  hydrocerin (EUCERIN) CREA Apply 1 application topically 2 (two) times daily. 07/06/20   Loletha Grayer, MD  ibuprofen (ADVIL) 400 MG tablet Take 1 tablet (400 mg total) by mouth every 8 (eight) hours as needed for headache or moderate pain. 07/06/20   Loletha Grayer, MD  ipratropium (ATROVENT HFA) 17 MCG/ACT inhaler Inhale 2 puffs into the lungs every 6 (six) hours.    [provider]  levothyroxine (SYNTHROID, LEVOTHROID) 25 MCG tablet Take 25 mcg by mouth daily before breakfast.    [provider]  metroNIDAZOLE (FLAGYL) 500 MG tablet Take 1 tablet (500 mg total) by mouth every 8 (eight) hours. 08/14/20   Tsosie Billing, MD  Multiple Vitamin (MULTIVITAMIN WITH MINERALS) TABS tablet Take 1 tablet by mouth daily. 07/06/20   Loletha Grayer, MD  nystatin (MYCOSTATIN/NYSTOP) powder Apply topically 3 (three) times daily. 07/06/20   Loletha Grayer, MD  Oxycodone HCl 10 MG TABS Take 1 tablet (10 mg total) by mouth every 6 (six) hours as needed. 07/03/20   Loletha Grayer, MD  pantoprazole (PROTONIX) 40 MG tablet Take 1 tablet (40 mg total) by mouth daily. 07/06/20 08/05/20  Loletha Grayer, MD  polyethylene glycol (MIRALAX / GLYCOLAX) 17 g packet Take 17 g by mouth daily as needed for moderate constipation. 07/06/20   Loletha Grayer, MD  sucralfate (CARAFATE) 1 g tablet Take 1 tablet (1 g total) by mouth 4 (four) times daily -  with meals and at bedtime. 07/06/20   Loletha Grayer, MD  tiotropium (SPIRIVA) 18  MCG inhalation capsule Place 18 mcg into inhaler and inhale daily.    [provider]  TRELEGY ELLIPTA 100-62.5-25 MCG/INH AEPB Inhale 1 puff into the lungs daily. 12/03/18   [provider]  triamcinolone cream (KENALOG) 0.5 % Apply to rash chest and back bid 07/06/20   Loletha Grayer, MD    Allergies Augmentin [amoxicillin-pot clavulanate]  Family History  Problem Relation Age of Onset  . Pulmonary fibrosis Mother     Social History Social History   Tobacco Use  . Smoking status: Former Research scientist (life sciences)  . Smokeless tobacco: Never Used  Substance Use Topics  . Alcohol use: No  . Drug use: No    Review of Systems  Review of Systems  Constitutional: Positive for malaise/fatigue. Negative for chills and fever.  HENT: Negative for sore throat.   Eyes: Negative for pain.  Respiratory: Positive for cough and shortness of breath. Negative for stridor.   Cardiovascular: Positive for chest pain.  Gastrointestinal: Negative for vomiting.  Skin: Negative for rash.  Neurological: Negative for seizures, loss of consciousness and headaches.  Psychiatric/Behavioral: Negative for suicidal ideas.  All other systems reviewed and are negative.     ____________________________________________   PHYSICAL EXAM:  VITAL SIGNS: ED Triage Vitals  Enc Vitals Group     BP      Pulse      Resp      Temp      Temp src      SpO2      Weight      Height      Head Circumference      Peak Flow      Pain Score      Pain Loc      Pain Edu?      Excl. in [place]?    Vitals:   01/25/21 2015 01/25/21 2030  BP:    Pulse: (!) 106 (!) 105  Resp: 17 18  Temp:    SpO2: 100% 100%   Physical Exam Vitals and nursing note reviewed.  Constitutional:      Appearance: He is well-developed. He is ill-appearing.  HENT:     Head: Normocephalic and atraumatic.     Right Ear: External ear normal.     Left Ear: External ear normal.     Nose: Nose normal.  Eyes:     Conjunctiva/sclera:  Conjunctivae normal.  Cardiovascular:     Rate and Rhythm: Regular rhythm. Tachycardia present.     Heart sounds: No murmur heard.   Pulmonary:     Effort: Pulmonary effort is normal. Tachypnea present. No respiratory distress.     Breath sounds: Examination of the left-upper field reveals decreased breath sounds. Examination of the left-middle field reveals decreased breath sounds and wheezing. Examination of the left-lower field reveals decreased breath sounds and wheezing. Decreased breath sounds and wheezing (end expiratory )  present. No rhonchi or rales.  Abdominal:     Palpations: Abdomen is soft.     Tenderness: There is no abdominal tenderness.  Musculoskeletal:     Cervical back: Neck supple.     Right lower leg: Edema present.     Left lower leg: Edema present.  Skin:    General: Skin is warm and dry.  Neurological:     Mental Status: He is alert and oriented to person, place, and time.  Psychiatric:        Mood and Affect: Mood normal.      ____________________________________________   LABS (all labs ordered are listed, but only abnormal results are displayed)  Labs Reviewed  CBC WITH DIFFERENTIAL/PLATELET - Abnormal; Notable for the following components:      Result Value   WBC 10.6 (*)    RBC 3.98 (*)    Hemoglobin 11.8 (*)    HCT 37.8 (*)    Monocytes Absolute 1.6 (*)    All other components within normal limits  COMPREHENSIVE METABOLIC PANEL - Abnormal; Notable for the following components:   Glucose, Bld 128 (*)    BUN 29 (*)    All other components within normal limits  BRAIN NATRIURETIC PEPTIDE - Abnormal; Notable for the following components:   B Natriuretic Peptide 379.0 (*)    All other components within normal limits  TROPONIN I (HIGH SENSITIVITY) - Abnormal; Notable for the following components:   Troponin I (High Sensitivity) 27 (*)    All other components within normal limits  RESP PANEL BY RT-PCR (FLU A&B, COVID) ARPGX2  CULTURE, BLOOD  (ROUTINE X 2)  CULTURE, BLOOD (ROUTINE X 2)  PROCALCITONIN  MAGNESIUM  TROPONIN I (HIGH SENSITIVITY)   ____________________________________________  EKG  Sinus tachycardia with a physical rate of 103, left axis deviation, nonspecific ST changes in V1 and V2 with some artifact in leads II and III.  No other clear evidence of acute ischemia or other significant underlying arrhythmia. ____________________________________________  RADIOLOGY  ED MD interpretation: Portable chest x-ray shows chronic volume loss in the left hemithorax with some patchy opacities that appear chronic going back to plain films of the chest from October 2021.  There is peribronchial thickening and emphysema in the right lung without clear focal consolidation or obvious pneumothorax.  CTA chest shows no evidence of PE but does show likely left upper lobe pneumonia on top of likely chronic lung damage from prior necrotic pneumonia.  There is aortic atherosclerosis and CAD Sowles emphysema noted.  Trace pleural fluid.  Some thoracic adenopathy.  No other clear acute intrathoracic abnormality.  Official radiology report(s): CT Angio Chest PE W and/or Wo Contrast  Result Date: 01/25/2021 CLINICAL DATA:  Shortness of breath. Started steroids last week. On home oxygen. Cough. Ex-smoker. EXAM: CT ANGIOGRAPHY CHEST WITH CONTRAST TECHNIQUE: Multidetector CT imaging of the chest was performed using the standard protocol during bolus administration of intravenous contrast. Multiplanar CT image reconstructions and MIPs were obtained to evaluate the vascular anatomy. CONTRAST:  53mL OMNIPAQUE IOHEXOL 350 MG/ML SOLN COMPARISON:  Chest radiographs, most recent earlier today. Most recent CT of 06/18/2020. FINDINGS: Cardiovascular: The quality of this exam for evaluation of pulmonary embolism is good. The bolus is relatively well timed. No evidence of pulmonary embolism. Aortic atherosclerosis. Tortuous thoracic aorta. Mild cardiomegaly,  without pericardial effusion. Multivessel coronary artery atherosclerosis. Mediastinum/Nodes: Pretracheal node measures 1.5 cm on 27/4 and is similar to on the prior exam (when remeasured). Subcarinal node measures 1.3 cm and  is similar to on the prior exam. No hilar adenopathy. Lungs/Pleura: Trace left pleural fluid is similar. Advanced bullous emphysema. 4 mm right lower lobe pulmonary nodule is similar to on the prior exam, also present on 01/17/2019 and considered benign. Improved aeration since 06/18/2020. Residual cavitation in the left apex, including on 18/6. Residual left upper lobe lobe areas of patchy airspace and interstitial thickening. Similar left greater than right lower lobe interstitial thickening which is likely due to remote infection Upper Abdomen: Normal imaged portions of the liver, stomach, pancreas, gallbladder, adrenal glands, kidneys. Old granulomatous disease in the spleen. Advanced abdominal aortic atherosclerosis. Musculoskeletal: Osteopenia. Accentuation of expected thoracic kyphosis with lower thoracic vertebral augmentation at multiple levels. Review of the MIP images confirms the above findings. IMPRESSION: 1.  No evidence of pulmonary embolism. 2. Left upper airspace disease, interstitial thickening, and cavitation, in the setting of more severe necrotic pneumonia on 06/18/2020. This could all be the sequelae of prior infection and scarring. Left upper lobe superimposed recurrent pneumonia cannot be excluded. 3. Aortic atherosclerosis (ICD10-I70.0), coronary artery atherosclerosis and emphysema (ICD10-J43.9). 4. Trace left pleural fluid is unchanged. 5. Thoracic adenopathy is similar and likely reactive. Electronically Signed   By: Abigail Miyamoto M.D.   On: 01/25/2021 20:28   DG Chest Port 1 View  Result Date: 01/25/2021 CLINICAL DATA:  Chest pain.  Shortness of breath. EXAM: PORTABLE CHEST 1 VIEW COMPARISON:  Radiograph 07/08/2020 CT 06/18/2020 FINDINGS: Chronic volume loss in  the left hemithorax. Patchy increased density throughout the left hemithorax greatest in the mid and upper lung zone, not significantly changed from prior exam. The previous air-fluid level on CT is not seen by radiograph. Possible pleural thickening in the lateral left upper lung zone. Right lung emphysema and bronchial thickening without acute airspace disease. Heart size difficult to assess due to leftward cardiac shift, chronic. No pneumothorax. IMPRESSION: 1. Chronic volume loss in the left hemithorax with patchy opacity throughout the left hemithorax, not significantly changed from October 2021 exam, likely underlying scarring/fibrosis. 2. Emphysema and bronchial thickening in the right lung. 3. No definite acute findings. Electronically Signed   By: Keith Rake M.D.   On: 01/25/2021 19:11    ____________________________________________   PROCEDURES  Procedure(s) performed (including Critical Care):  .1-3 Lead EKG Interpretation Performed by: Lucrezia Starch, MD Authorized by: Lucrezia Starch, MD     Interpretation: normal     ECG rate assessment: tachycardic     Rhythm: sinus tachycardia     Ectopy: none     Conduction: normal       ____________________________________________   INITIAL IMPRESSION / ASSESSMENT AND PLAN / ED COURSE      Patient presents with above-stated history exam for assessment of acute on chronic shortness of breath associated with increased cough from baseline and some chest tightness.  On arrival patient is slight tachycardic at 104 and tachypneic at 22 with otherwise stable vital signs on his usual 4 L.  Differential includes COPD exacerbation, pneumonia, ACS, PE, arrhythmia, symptomatic anemia, and metabolic derangements.  CBC shows WBC count of 10.6 down from 12.56 months ago.  Hemoglobin is at baseline 11.8.  Otherwise unremarkable.  CMP shows no significant electrolyte or metabolic derangements.  Troponin is 27 which I suspect represents a  very mild demand ischemia as it was last 26 and 23 several months ago although we will plan to obtain a delta today.  Patient's COVID and influenza swab was negative.  Magnesium is WNL.  As  noted above chest x-ray shows loss of white out of the left lung fields with the right showing emphysema and some peribronchial thickening without blood consolidation pneumothorax large effusion or overt edema.  BNP is elevated at 379 given lower extremity concern for possible mild heart failure contributing to his shortness of breath although patient is adamant he has no history of this and refuses any diuretics at this time as he states it significantly alters his taste and wishes to hold off on this until he can undergo an ultrasound.  I think he has capacity to make this decision.    CTA chest shows no evidence of PE but does show likely left upper lobe pneumonia on top of likely chronic lung damage from prior necrotic pneumonia.  There is aortic atherosclerosis and CAD Sowles emphysema noted.  Trace pleural fluid.  Some thoracic adenopathy.  No other clear acute intrathoracic abnormality.  Concern for recurrence of pneumonia with some mild COPD exacerbation given some wheezing on exam.  Given history of cavitary pneumonia is unclear etiology with aspiration within differential will cover anaerobes for possible Pseudomonas with cefepime.  Do not believe he is septic ACS no fever or hypotension or leukocytosis.  We will plan to admit to hospital service for further evaluation and management.      ____________________________________________   FINAL CLINICAL IMPRESSION(S) / ED DIAGNOSES  Final diagnoses:  SOB (shortness of breath)  Chronic anemia  Chronic respiratory failure with hypoxia ([place])  Community acquired pneumonia of left upper lobe of lung  Troponin I above reference range  Elevated brain natriuretic peptide (BNP) level    Medications  iohexol (OMNIPAQUE) 350 MG/ML injection 75 mL (75 mLs  Intravenous Contrast Given 01/25/21 1959)  ipratropium-albuterol (DUONEB) 0.5-2.5 (3) MG/3ML nebulizer solution 9 mL (9 mLs Nebulization Given 01/25/21 2031)     ED Discharge Orders    None       Note:  This document was prepared using Dragon voice recognition software and may include unintentional dictation errors.   Lucrezia Starch, MD 01/25/21 2042

## 2021-01-25 NOTE — ED Triage Notes (Signed)
Pt brought in via ems from home with sob.  Pt has no chest pain.  Hx copd.  Pt on 4 liters oxygen at home.  Pt alert  Iv in place.

## 2021-01-25 NOTE — H&P (Addendum)
[facility]   PATIENT NAME: Mark [name]    MR#:  [id number]  DATE OF BIRTH:  September 26, 1951  DATE OF ADMISSION:  01/25/2021  PRIMARY CARE PHYSICIAN: Colford, Delcie Roch, MD   Patient is coming from: Home  REQUESTING/REFERRING PHYSICIAN: Hulan Saas, MD CHIEF COMPLAINT:   Chief Complaint  Patient presents with  . Shortness of Breath    HISTORY OF PRESENT ILLNESS:  Mark [name] is a 70 y.o. Caucasian male with medical history significant for advanced COPD with chronic respiratory failure on home O2 at 4 L/min, asthma, osteoporosis with history of necrotizing pneumonia in September of last year, who presented to the emergency room with acute onset of worsening dyspnea with associated chest tightness, as well as cough productive of yellowish sputum and wheezing which have been worsening since Wednesday.  He called his primary care doctor and had a telemedicine appointment in which she was prescribed p.o. Z-Pak and prednisone taper that he started on Tuesday.  He admitted to mild low-grade fever denies any chills.  No nausea or vomiting or abdominal pain.  No headache or dizziness or blurred vision.  No dysuria, oliguria or hematuria or flank pain.  The patient is on home hospice.  ED Course: Upon position to the ER blood pressure was 113/54 with a heart rate of 104 and respiratory rate of 22 and pulse oximetry was 96% on 4 L of O2 by nasal cannula.  CMP was unremarkable.  BNP was 379 and high-sensitivity troponin I was 27.  CBC showed anemia.  Procalcitonin was less than 0.1.  Influenza antigens and COVID 19 PCR came back negative. EKG as reviewed by me : EKG shows sinus tachycardia with rate 103 with left axis deviation and poor R wave progression and minimal voltage criteria for LVH. Imaging: Portable chest x-ray showed: 1. Chronic volume loss in the left hemithorax with patchy opacity throughout the left hemithorax, not significantly changed from October 2021 exam, likely  underlying scarring/fibrosis. 2. Emphysema and bronchial thickening in the right lung. 3. No definite acute findings. Chest CTA revealed the following: 1.  No evidence of pulmonary embolism. 2. Left upper airspace disease, interstitial thickening, and cavitation, in the setting of more severe necrotic pneumonia on 06/18/2020. This could all be the sequelae of prior infection and scarring. Left upper lobe superimposed recurrent pneumonia cannot be excluded. 3. Aortic atherosclerosis (ICD10-I70.0), coronary artery atherosclerosis and emphysema (ICD10-J43.9). 4. Trace left pleural fluid is unchanged. 5. Thoracic adenopathy is similar and likely reactive.  The patient was given 2 g of IV cefepime and DuoNeb's.  He will be admitted to a medical monitored bed for further evaluation and management. PAST MEDICAL HISTORY:   Past Medical History:  Diagnosis Date  . Acute respiratory failure with hypoxia and hypercapnia ([place]) 05/01/2016  . Asthma   . COPD (chronic obstructive pulmonary disease) ([place])   . Osteoporosis     PAST SURGICAL HISTORY:   Past Surgical History:  Procedure Laterality Date  . BACK SURGERY      SOCIAL HISTORY:   Social History   Tobacco Use  . Smoking status: Former Research scientist (life sciences)  . Smokeless tobacco: Never Used  Substance Use Topics  . Alcohol use: No    FAMILY HISTORY:   Family History  Problem Relation Age of Onset  . Pulmonary fibrosis Mother     DRUG ALLERGIES:   Allergies  Allergen Reactions  . Augmentin [Amoxicillin-Pot Clavulanate] Rash    Maculopapular rash after receiving several days of  amoxicillin/clavulanate despite tolerating week of ampicillin/sulbactam    REVIEW OF SYSTEMS:   ROS As per history of present illness. All pertinent systems were reviewed above. Constitutional, HEENT, cardiovascular, respiratory, GI, GU, musculoskeletal, neuro, psychiatric, endocrine, integumentary and hematologic systems were reviewed and are otherwise  negative/unremarkable except for positive findings mentioned above in the HPI.   MEDICATIONS AT HOME:   Prior to Admission medications   Medication Sig Start Date End Date Taking? Authorizing Provider  albuterol (PROVENTIL HFA;VENTOLIN HFA) 108 (90 Base) MCG/ACT inhaler Inhale 2 puffs into the lungs every 4 (four) hours as needed for wheezing or shortness of breath.     [provider]  aspirin 81 MG tablet Take 81 mg by mouth daily.    [provider]  budesonide-formoterol (SYMBICORT) 160-4.5 MCG/ACT inhaler Inhale 2 puffs into the lungs 2 (two) times daily.    [provider]  clonazePAM (KLONOPIN) 1 MG tablet Take 1 tablet (1 mg total) by mouth 2 (two) times daily. 07/03/20   Loletha Grayer, MD  cyclobenzaprine (FLEXERIL) 10 MG tablet Take 10 mg by mouth 3 (three) times daily as needed for muscle spasms.    [provider]  dexamethasone (DECADRON) 4 MG tablet Take 1 tablet (4 mg total) by mouth daily. 07/06/20   Loletha Grayer, MD  diphenhydrAMINE (BENADRYL) 25 mg capsule Take 25-50 mg by mouth every 6 (six) hours as needed for allergies.     [provider]  EPINEPHrine (PRIMATENE MIST) 0.125 MG/ACT AERO Inhale 1 Dose into the lungs as needed (allergies).    [provider]  fluticasone (FLONASE) 50 MCG/ACT nasal spray Place 2 sprays into both nostrils daily as needed for allergies.  05/29/20   [provider]  hydrocerin (EUCERIN) CREA Apply 1 application topically 2 (two) times daily. 07/06/20   Loletha Grayer, MD  ibuprofen (ADVIL) 400 MG tablet Take 1 tablet (400 mg total) by mouth every 8 (eight) hours as needed for headache or moderate pain. 07/06/20   Loletha Grayer, MD  ipratropium (ATROVENT HFA) 17 MCG/ACT inhaler Inhale 2 puffs into the lungs every 6 (six) hours.    [provider]  levothyroxine (SYNTHROID, LEVOTHROID) 25 MCG tablet Take 25 mcg by mouth daily before breakfast.    [provider]   metroNIDAZOLE (FLAGYL) 500 MG tablet Take 1 tablet (500 mg total) by mouth every 8 (eight) hours. 08/14/20   Tsosie Billing, MD  Multiple Vitamin (MULTIVITAMIN WITH MINERALS) TABS tablet Take 1 tablet by mouth daily. 07/06/20   Loletha Grayer, MD  nystatin (MYCOSTATIN/NYSTOP) powder Apply topically 3 (three) times daily. 07/06/20   Loletha Grayer, MD  Oxycodone HCl 10 MG TABS Take 1 tablet (10 mg total) by mouth every 6 (six) hours as needed. 07/03/20   Loletha Grayer, MD  pantoprazole (PROTONIX) 40 MG tablet Take 1 tablet (40 mg total) by mouth daily. 07/06/20 08/05/20  Loletha Grayer, MD  polyethylene glycol (MIRALAX / GLYCOLAX) 17 g packet Take 17 g by mouth daily as needed for moderate constipation. 07/06/20   Loletha Grayer, MD  sucralfate (CARAFATE) 1 g tablet Take 1 tablet (1 g total) by mouth 4 (four) times daily -  with meals and at bedtime. 07/06/20   Loletha Grayer, MD  tiotropium (SPIRIVA) 18 MCG inhalation capsule Place 18 mcg into inhaler and inhale daily.    [provider]  TRELEGY ELLIPTA 100-62.5-25 MCG/INH AEPB Inhale 1 puff into the lungs daily. 12/03/18   [provider]  triamcinolone cream (KENALOG) 0.5 %  Apply to rash chest and back bid 07/06/20   Loletha Grayer, MD      VITAL SIGNS:  Blood pressure 105/77, pulse (!) 105, temperature 98.1 F (36.7 C), temperature source Oral, resp. rate 18, height 5\' 10"  (1.778 m), weight 99.8 kg, SpO2 100 %.  PHYSICAL EXAMINATION:  Physical Exam  GENERAL:  70 y.o.-year-old Caucasian male patient lying in the bed with no acute distress.  EYES: Pupils equal, round, reactive to light and accommodation. No scleral icterus. Extraocular muscles intact.  HEENT: Head atraumatic, normocephalic. Oropharynx and nasopharynx clear.  NECK:  Supple, no jugular venous distention. No thyroid enlargement, no tenderness.  LUNGS: Diminished bibasal and left upper lung zone breath sounds with associated crackles in the  liter.  Diffuse expiratory wheezes with tight expiratory airflow and harsh vesicular breathing. CARDIOVASCULAR: Regular rate and rhythm, S1, S2 normal. No murmurs, rubs, or gallops.  ABDOMEN: Soft, nondistended, nontender. Bowel sounds present. No organomegaly or mass.  EXTREMITIES: No pedal edema, cyanosis, or clubbing.  NEUROLOGIC: Cranial nerves II through XII are intact. Muscle strength 5/5 in all extremities. Sensation intact. Gait not checked.  PSYCHIATRIC: The patient is alert and oriented x 3.  Normal affect and good eye contact. SKIN: No obvious rash, lesion, or ulcer.   LABORATORY PANEL:   CBC Recent Labs  Lab 01/25/21 1829  WBC 10.6*  HGB 11.8*  HCT 37.8*  PLT 247   ------------------------------------------------------------------------------------------------------------------  Chemistries  Recent Labs  Lab 01/25/21 1829  NA 141  K 3.8  CL 98  CO2 31  GLUCOSE 128*  BUN 29*  CREATININE 0.94  CALCIUM 9.3  MG 2.1  AST 28  ALT 13  ALKPHOS 62  BILITOT 0.7   ------------------------------------------------------------------------------------------------------------------  Cardiac Enzymes No results for input(s): TROPONINI in the last 168 hours. ------------------------------------------------------------------------------------------------------------------  RADIOLOGY:  CT Angio Chest PE W and/or Wo Contrast  Result Date: 01/25/2021 CLINICAL DATA:  Shortness of breath. Started steroids last week. On home oxygen. Cough. Ex-smoker. EXAM: CT ANGIOGRAPHY CHEST WITH CONTRAST TECHNIQUE: Multidetector CT imaging of the chest was performed using the standard protocol during bolus administration of intravenous contrast. Multiplanar CT image reconstructions and MIPs were obtained to evaluate the vascular anatomy. CONTRAST:  34mL OMNIPAQUE IOHEXOL 350 MG/ML SOLN COMPARISON:  Chest radiographs, most recent earlier today. Most recent CT of 06/18/2020. FINDINGS:  Cardiovascular: The quality of this exam for evaluation of pulmonary embolism is good. The bolus is relatively well timed. No evidence of pulmonary embolism. Aortic atherosclerosis. Tortuous thoracic aorta. Mild cardiomegaly, without pericardial effusion. Multivessel coronary artery atherosclerosis. Mediastinum/Nodes: Pretracheal node measures 1.5 cm on 27/4 and is similar to on the prior exam (when remeasured). Subcarinal node measures 1.3 cm and is similar to on the prior exam. No hilar adenopathy. Lungs/Pleura: Trace left pleural fluid is similar. Advanced bullous emphysema. 4 mm right lower lobe pulmonary nodule is similar to on the prior exam, also present on 01/17/2019 and considered benign. Improved aeration since 06/18/2020. Residual cavitation in the left apex, including on 18/6. Residual left upper lobe lobe areas of patchy airspace and interstitial thickening. Similar left greater than right lower lobe interstitial thickening which is likely due to remote infection Upper Abdomen: Normal imaged portions of the liver, stomach, pancreas, gallbladder, adrenal glands, kidneys. Old granulomatous disease in the spleen. Advanced abdominal aortic atherosclerosis. Musculoskeletal: Osteopenia. Accentuation of expected thoracic kyphosis with lower thoracic vertebral augmentation at multiple levels. Review of the MIP images confirms the above findings. IMPRESSION: 1.  No evidence of pulmonary embolism.  2. Left upper airspace disease, interstitial thickening, and cavitation, in the setting of more severe necrotic pneumonia on 06/18/2020. This could all be the sequelae of prior infection and scarring. Left upper lobe superimposed recurrent pneumonia cannot be excluded. 3. Aortic atherosclerosis (ICD10-I70.0), coronary artery atherosclerosis and emphysema (ICD10-J43.9). 4. Trace left pleural fluid is unchanged. 5. Thoracic adenopathy is similar and likely reactive. Electronically Signed   By: Abigail Miyamoto M.D.   On:  01/25/2021 20:28   DG Chest Port 1 View  Result Date: 01/25/2021 CLINICAL DATA:  Chest pain.  Shortness of breath. EXAM: PORTABLE CHEST 1 VIEW COMPARISON:  Radiograph 07/08/2020 CT 06/18/2020 FINDINGS: Chronic volume loss in the left hemithorax. Patchy increased density throughout the left hemithorax greatest in the mid and upper lung zone, not significantly changed from prior exam. The previous air-fluid level on CT is not seen by radiograph. Possible pleural thickening in the lateral left upper lung zone. Right lung emphysema and bronchial thickening without acute airspace disease. Heart size difficult to assess due to leftward cardiac shift, chronic. No pneumothorax. IMPRESSION: 1. Chronic volume loss in the left hemithorax with patchy opacity throughout the left hemithorax, not significantly changed from October 2021 exam, likely underlying scarring/fibrosis. 2. Emphysema and bronchial thickening in the right lung. 3. No definite acute findings. Electronically Signed   By: Keith Rake M.D.   On: 01/25/2021 19:11      IMPRESSION AND PLAN:  Active Problems:   CAP (community acquired pneumonia)  1.  COPD acute exacerbation like secondary to community-acquired pneumonia with associated chronic hypoxic respiratory failure. - The patient will be admitted to a medical monitored bed. - We will continue his IV steroid therapy with IV Solu-Medrol. - We will continue antibiotic therapy with IV Rocephin and Zithromax. - Mucolytic therapy will be provided. - Nebulized bronchodilator therapy will be provided on as needed and scheduled basis. - I will hold off long-acting beta agonist containing inhalers at this time. - We will follow blood and sputum cultures. - We will obtain pneumonia antigens  2.  Mild sepsis secondary to community-acquired pneumonia.  This is manifested by tachycardia and tachypnea. - Management as above. -We will hold off hydration given elevated BNP.  3.   Hypothyroidism. - We will continue Synthroid.  4.  Anxiety. - We will continue Klonopin.  5.  GERD. - We will continue Carafate and PPI therapy.  DVT prophylaxis: Lovenox. Code Status: The patient is DNR/DNI. Family Communication:  The plan of care was discussed in details with the patient (and family). I answered all questions. The patient agreed to proceed with the above mentioned plan. Further management will depend upon hospital course. Disposition Plan: Back to previous home environment Consults called: none. All the records are reviewed and case discussed with ED provider.  Status is: Inpatient  Remains inpatient appropriate because:Ongoing diagnostic testing needed not appropriate for outpatient work up, Unsafe d/c plan, IV treatments appropriate due to intensity of illness or inability to take PO and Inpatient level of care appropriate due to severity of illness   Dispo: The patient is from: Home              Anticipated d/c is to: Home              Patient currently is not medically stable to d/c.   Difficult to place patient No  TOTAL TIME TAKING CARE OF THIS PATIENT: 55 minutes.    Christel Mormon M.D on 01/25/2021 at 8:57 PM  [facility]   From 7 PM-7 AM, contact night-coverage [email or web address]  CC: Primary care physician; Colford, Delcie Roch, MD

## 2021-01-26 ENCOUNTER — Encounter: Payer: Self-pay | Admitting: Family Medicine

## 2021-01-26 ENCOUNTER — Other Ambulatory Visit: Payer: Self-pay

## 2021-01-26 DIAGNOSIS — J440 Chronic obstructive pulmonary disease with acute lower respiratory infection: Secondary | ICD-10-CM | POA: Diagnosis not present

## 2021-01-26 DIAGNOSIS — J209 Acute bronchitis, unspecified: Secondary | ICD-10-CM

## 2021-01-26 DIAGNOSIS — J189 Pneumonia, unspecified organism: Secondary | ICD-10-CM | POA: Diagnosis not present

## 2021-01-26 LAB — BASIC METABOLIC PANEL
Anion gap: 7 (ref 5–15)
BUN: 21 mg/dL (ref 8–23)
CO2: 33 mmol/L — ABNORMAL HIGH (ref 22–32)
Calcium: 8.5 mg/dL — ABNORMAL LOW (ref 8.9–10.3)
Chloride: 99 mmol/L (ref 98–111)
Creatinine, Ser: 0.82 mg/dL (ref 0.61–1.24)
GFR, Estimated: >60 mL/min (ref >60)
Glucose, Bld: 146 mg/dL — ABNORMAL HIGH (ref 70–99)
Potassium: 4 mmol/L (ref 3.5–5.1)
Sodium: 139 mmol/L (ref 135–145)

## 2021-01-26 LAB — CBC
HCT: 35.8 % — ABNORMAL LOW (ref 39.0–52.0)
Hemoglobin: 11.1 g/dL — ABNORMAL LOW (ref 13.0–17.0)
MCH: 29.4 pg (ref 26.0–34.0)
MCHC: 31 g/dL (ref 30.0–36.0)
MCV: 94.7 fL (ref 80.0–100.0)
Platelets: 217 10*3/uL (ref 150–400)
RBC: 3.78 MIL/uL — ABNORMAL LOW (ref 4.22–5.81)
RDW: 12.8 % (ref 11.5–15.5)
WBC: 5.2 10*3/uL (ref 4.0–10.5)
nRBC: 0 % (ref 0.0–0.2)

## 2021-01-26 MED ORDER — GUAIFENESIN-CODEINE 100-10 MG/5ML PO SOLN
10.0000 mL | Freq: Four times a day (QID) | ORAL | Status: DC | PRN
  Administered 2021-01-26 – 2021-01-30 (×12): 10 mL via ORAL
  Filled 2021-01-26 (×13): qty 10

## 2021-01-26 NOTE — Progress Notes (Signed)
[place] patient RN note:  Mark [name] is a current hospice patient with a terminal diagnosis of COPD. He activated EMS for worsening dyspnea and productive cough for several days. Hospice Social Worker notified about patient's decision to call 911. Patient was admitted to [facility] on 04.25 at 8:40 pm with a diagnosis of COPD exacerbation and pneumonia. He is a DNR. Per Dr.Walker with [facility], this is a related admission.  Upon position to the ER blood pressure was 113/54 with a heart rate of 104 and respiratory rate of 22 and pulse oximetry was 96% on 4 L of O2 by nasal cannula.  CMP was unremarkable.  BNP was 379 and high-sensitivity troponin I was 27.  CBC showed anemia.  Procalcitonin was less than 0.1.  Influenza antigens and COVID 19 PCR came back negative. EKG as reviewed by me : EKG shows sinus tachycardia with rate 103 with left axis deviation and poor R wave progression and minimal voltage criteria for LVH. Imaging: Portable chest x-ray showed: 1. Chronic volume loss in the left hemithorax with patchy opacity throughout the left hemithorax, not significantly changed from October 2021 exam, likely underlying scarring/fibrosis. 2. Emphysema and bronchial thickening in the right lung. 3. No definite acute findings. Chest CTA revealed the following: 1. No evidence of pulmonary embolism. 2. Left upper airspace disease, interstitial thickening, and cavitation, in the setting of more severe necrotic pneumonia on 06/18/2020. This could all be the sequelae of prior infection and scarring. Left upper lobe superimposed recurrent pneumonia cannot be excluded. 3. Aortic atherosclerosis (ICD10-I70.0), coronary artery atherosclerosis and emphysema (ICD10-J43.9). 4. Trace left pleural fluid is unchanged. 5. Thoracic adenopathy is similar and likely reactive. The patient was given 2 g of IV cefepime and DuoNeb's  and admitted.  Visited with patient at bedside in the ER. He was resting comfortably with oxygen at 4 liters. He was noted to have wheezing and a cough. Report exchanged with bedside RN. Plan is to transfer to medical bed and started on IV antibiotics.  Vital Signs: BP 107/63; HR 101; Resp 16; Temp 97.7; O2 97% on 4 liters  I & O: none charted  Abnormal Labs: CO2: 33 (H) Glucose: 146 (H) Calcium: 8.5 (L) RBC: 3.78 (L) Hemoglobin: 11.1 (L) HCT: 35.8 (L)  Diagnostics: CXR: IMPRESSION: 1. Chronic volume loss in the left hemithorax with patchy opacity throughout the left hemithorax, not significantly changed from October 2021 exam, likely underlying scarring/fibrosis. 2. Emphysema and bronchial thickening in the right lung. 3. No definite acute findings.  CT Chest: IMPRESSION: 1.  No evidence of pulmonary embolism. 2. Left upper airspace disease, interstitial thickening, and cavitation, in the setting of more severe necrotic pneumonia on 06/18/2020. This could all be the sequelae of prior infection and scarring. Left upper lobe superimposed recurrent pneumonia cannot be excluded. 3. Aortic atherosclerosis (ICD10-I70.0), coronary artery atherosclerosis and emphysema (ICD10-J43.9). 4. Trace left pleural fluid is unchanged. 5. Thoracic adenopathy is similar and likely reactive.  IV/PRN Meds: methylPREDNISolone sodium succinate (SOLU-MEDROL) 40 mg/mL injection 40 mg Dose: 40 mg Freq: Every 6 hours Route: IV Start: 01/25/21 2100 End: 01/26/21 2059  0.9 % sodium chloride infusion Rate: 100 mL/hr Freq: Continuous Route: IV Last Dose: 100 mL (01/25/21 2208) Start: 01/25/21 2100  azithromycin (ZITHROMAX) 500 mg in sodium chloride 0.9 % 250 mL IVPB Dose: 500 mg Freq: Every 24 hours Route: IV Last Dose: Stopped (01/26/21 0237) Start: 01/25/21 2100 End: 01/30/21 2059  cefTRIAXone (ROCEPHIN)  2 g in sodium chloride 0.9 % 100 mL IVPB Dose: 2 g Freq: Every 24 hours Route:  IV Last Dose: Stopped (01/26/21 0045) Start: 01/25/21 2200 End: 01/30/21 2159  iohexol (OMNIPAQUE) 350 MG/ML injection 75 mL Dose: 75 mL Freq: Once PRN Route: IV PRN Reason: contrast Start: 01/25/21 1939 End: 01/25/21 1959  ipratropium-albuterol (DUONEB) 0.5-2.5 (3) MG/3ML nebulizer solution 3 mL Dose: 3 mL Freq: Every 4 hours PRN Route: NEBULIZATION PRN Reasons: wheezing,shortness of breath Start: 01/25/21 2104  Problem List: Active Problems:   CAP (community acquired pneumonia)  Acute COPD exacerbation like secondary to concurrent community-acquired pneumoniawith associated chronic hypoxic respiratory failure, failure of outpatient therapy. -Patient treated outpatient with steroids and Z-Pak with minimal to no improvement and worsening symptoms over the past week -Continue IV steroids, ceftriaxone azithromycin and supportive care -Patient is on 4 L nasal cannula chronically, continues on oxygen here at 4 L baseline -unclear if he is able to ambulate today although will need an oxygen walk screen prior to disposition to rule out any acute hypoxia -Unclear if infectious versus COPD exacerbation or combination given patient's markedly abnormal CT and chest x-ray; which appear similar to previous baseline  Sepsis secondary to community-acquired pneumonia.  - Patient technically meets sepsis criteria due to suspected CAP with tachycardia and tachypnea. - We will hold off hydration given elevated BNP/volume overload risk  Hypothyroidism. -Continue home Synthroid  General anxiety disorder -Continue home clonazepam  GERD -Continue home Carafate and PPI therapy.  Discharge Planning: Ongoing..has refused SNF placement and will likely discharge back home.  Family Contact: Placed call to sister, Casilda Carls. LVM.  IDG: Updated  Goals of care: Clear  Medication list and Transfer Summary placed on [facility] Chart.  Please call with any hospice related questions or  concerns.  Zandra Abts, RN [facility] Liaison [phone]

## 2021-01-26 NOTE — ED Notes (Signed)
Pt resting comfortably with eyes closed, no distress noted will continue to monitor.

## 2021-01-26 NOTE — Progress Notes (Signed)
PROGRESS NOTE    Mark [name]  [id number] DOB: 09-07-1951 DOA: 01/25/2021 PCP: Sandra Cockayne, MD   Brief Narrative:  Mark [name] is a 70 y.o. Caucasian male with medical history significant for advanced COPD with chronic respiratory failure on home O2 at 4 L/min, asthma, osteoporosis with history of necrotizing pneumonia in September of last year, who presented to the emergency room with acute onset of worsening dyspnea with associated chest tightness, as well as cough productive of yellowish sputum and wheezing which have been worsening since Wednesday.  He called his primary care doctor and had a telemedicine appointment in which she was prescribed p.o. Z-Pak and prednisone taper that he started on Tuesday.  He admitted to mild low-grade fever denies any chills.  No nausea or vomiting or abdominal pain.  No headache or dizziness or blurred vision.  No dysuria, oliguria or hematuria or flank pain.  The patient is on home hospice.   Assessment & Plan:   Active Problems:   CAP (community acquired pneumonia)  Acute COPD exacerbation like secondary to concurrent community-acquired pneumonia with associated chronic hypoxic respiratory failure, failure of outpatient therapy. -Patient treated outpatient with steroids and Z-Pak with minimal to no improvement and worsening symptoms over the past week -Continue IV steroids, ceftriaxone azithromycin and supportive care -Patient is on 4 L nasal cannula chronically, continues on oxygen here at 4 L baseline -unclear if he is able to ambulate today although will need an oxygen walk screen prior to disposition to rule out any acute hypoxia -Unclear if infectious versus COPD exacerbation or combination given patient's markedly abnormal CT and chest x-ray; which appear similar to previous baseline  Sepsis secondary to community-acquired pneumonia.   - Patient technically meets sepsis criteria due to suspected CAP with tachycardia and  tachypnea. - We will hold off hydration given elevated BNP/volume overload risk  Hypothyroidism. - Continue home Synthroid  General anxiety disorder - Continue home clonazepam  GERD -Continue home Carafate and PPI therapy.  DVT prophylaxis: Lovenox. Code Status: The patient is DNR/DNI -he is followed outpatient by hospice Family Communication: None present  Status is: Inpatient  Dispo: The patient is from: Home              Anticipated d/c is to: Home, patient already refusing placement at SNF facility will likely discharge back home with hospice              Anticipated d/c date is: 48 to 72 hours              Patient currently not medically stable for discharge given need for acute IV antibiotics and steroids in the setting of above with failure of outpatient therapy  Consultants:   None  Procedures:   None indicated  Antimicrobials:  Azithromycin, ceftriaxone x5 days -4/25 -4/29  Subjective: No acute issues or events overnight, patient somewhat rambling this morning with difficult to follow train of thought appears to be his baseline, denies any overt nausea vomiting diarrhea constipation headache fevers or chills, he reports feeling generally "unwell" without much specifics, respiratory status appears to be at baseline but he indicates he has not attempted to ambulate in the past few days which was initially his concern at home due to new limitations  Objective: Vitals:   01/26/21 0040 01/26/21 0300 01/26/21 0500 01/26/21 0700  BP: 124/66 104/64 101/60 106/70  Pulse: (!) 105 (!) 110 91 91  Resp: 18 17 12 20   Temp: 98.3 F (36.8 C)  TempSrc:      SpO2: 92% 93% 92% 91%  Weight:      Height:       No intake or output data in the 24 hours ending 01/26/21 0745 Filed Weights   01/25/21 1822  Weight: 99.8 kg    Examination:  General: Somewhat disheveled ill-appearing gentleman resting comfortably in bed no acute distress tolerating oxygen quite  well. HEENT:  Normocephalic atraumatic.  Sclerae nonicteric, noninjected.  Extraocular movements intact bilaterally. Neck:  Without mass or deformity.  Trachea is midline. Lungs: Diffuse coarse of breath sounds without overt rales scant wheezing diffusely. Heart:  Regular rate and rhythm.  Without murmurs, rubs, or gallops. Abdomen:  Soft, nontender, nondistended.  Without guarding or rebound. Extremities: Left greater than right foot erythema, blanching, appears to be somewhat dependent given his position in bed without overt edema. Vascular:  Dorsalis pedis and posterior tibial pulses palpable bilaterally. Skin:  Warm and dry, blanching erythema bilateral feet left greater than right as above.   Data Reviewed: I have personally reviewed following labs and imaging studies  CBC: Recent Labs  Lab 01/25/21 1829 01/26/21 0624  WBC 10.6* 5.2  NEUTROABS 6.2  --   HGB 11.8* 11.1*  HCT 37.8* 35.8*  MCV 95.0 94.7  PLT 247 301   Basic Metabolic Panel: Recent Labs  Lab 01/25/21 1829 01/26/21 0624  NA 141 139  K 3.8 4.0  CL 98 99  CO2 31 33*  GLUCOSE 128* 146*  BUN 29* 21  CREATININE 0.94 0.82  CALCIUM 9.3 8.5*  MG 2.1  --    GFR: Estimated Creatinine Clearance: 100.7 mL/min (by C-G formula based on SCr of 0.82 mg/dL). Liver Function Tests: Recent Labs  Lab 01/25/21 1829  AST 28  ALT 13  ALKPHOS 62  BILITOT 0.7  PROT 6.5  ALBUMIN 3.8   No results for input(s): LIPASE, AMYLASE in the last 168 hours. No results for input(s): AMMONIA in the last 168 hours. Coagulation Profile: No results for input(s): INR, PROTIME in the last 168 hours. Cardiac Enzymes: No results for input(s): CKTOTAL, CKMB, CKMBINDEX, TROPONINI in the last 168 hours. BNP (last 3 results) No results for input(s): PROBNP in the last 8760 hours. HbA1C: No results for input(s): HGBA1C in the last 72 hours. CBG: No results for input(s): GLUCAP in the last 168 hours. Lipid Profile: No results for  input(s): CHOL, HDL, LDLCALC, TRIG, CHOLHDL, LDLDIRECT in the last 72 hours. Thyroid Function Tests: No results for input(s): TSH, T4TOTAL, FREET4, T3FREE, THYROIDAB in the last 72 hours. Anemia Panel: No results for input(s): VITAMINB12, FOLATE, FERRITIN, TIBC, IRON, RETICCTPCT in the last 72 hours. Sepsis Labs: Recent Labs  Lab 01/25/21 [place] <0.10    Recent Results (from the past 240 hour(s))  Resp Panel by RT-PCR (Flu A&B, Covid) Nasopharyngeal Swab     Status: None   Collection Time: 01/25/21  6:29 PM   Specimen: Nasopharyngeal Swab; Nasopharyngeal(NP) swabs in vial transport medium  Result Value Ref Range Status   SARS Coronavirus 2 by RT PCR NEGATIVE NEGATIVE Final    Comment: (NOTE) SARS-CoV-2 target nucleic acids are NOT DETECTED.  The SARS-CoV-2 RNA is generally detectable in upper respiratory specimens during the acute phase of infection. The lowest concentration of SARS-CoV-2 viral copies this assay can detect is 138 copies/mL. A negative result does not preclude SARS-Cov-2 infection and should not be used as the sole basis for treatment or other patient management decisions. A negative result may occur  with  improper specimen collection/handling, submission of specimen other than nasopharyngeal swab, presence of viral mutation(s) within the areas targeted by this assay, and inadequate number of viral copies(<138 copies/mL). A negative result must be combined with clinical observations, patient history, and epidemiological information. The expected result is Negative.  Fact Sheet for Patients:  EntrepreneurPulse.com.au  Fact Sheet for Healthcare Providers:  IncredibleEmployment.be  This test is no t yet approved or cleared by the [place] FDA and  has been authorized for detection and/or diagnosis of SARS-CoV-2 by FDA under an Emergency Use Authorization (EUA). This EUA will remain  in effect (meaning this test  can be used) for the duration of the COVID-19 declaration under Section 564(b)(1) of the Act, 21 U.S.C.section 360bbb-3(b)(1), unless the authorization is terminated  or revoked sooner.       Influenza A by PCR NEGATIVE NEGATIVE Final   Influenza B by PCR NEGATIVE NEGATIVE Final    Comment: (NOTE) The Xpert Xpress SARS-CoV-2/FLU/RSV plus assay is intended as an aid in the diagnosis of influenza from Nasopharyngeal swab specimens and should not be used as a sole basis for treatment. Nasal washings and aspirates are unacceptable for Xpert Xpress SARS-CoV-2/FLU/RSV testing.  Fact Sheet for Patients: EntrepreneurPulse.com.au  Fact Sheet for Healthcare Providers: IncredibleEmployment.be  This test is not yet approved or cleared by the [place] FDA and has been authorized for detection and/or diagnosis of SARS-CoV-2 by FDA under an Emergency Use Authorization (EUA). This EUA will remain in effect (meaning this test can be used) for the duration of the COVID-19 declaration under Section 564(b)(1) of the Act, 21 U.S.C. section 360bbb-3(b)(1), unless the authorization is terminated or revoked.  Performed at [facility], [address] [place], [place] [address]   Blood culture (routine x 2)     Status: None (Preliminary result)   Collection Time: 01/25/21  9:37 PM   Specimen: BLOOD  Result Value Ref Range Status   Specimen Description BLOOD LEFT ANTECUBITAL  Final   Special Requests   Final    BOTTLES DRAWN AEROBIC AND ANAEROBIC Blood Culture adequate volume   Culture   Final    NO GROWTH < 12 HOURS Performed at [facility], [address] [place], [place] [address]    Report Status PENDING  Incomplete  Blood culture (routine x 2)     Status: None (Preliminary result)   Collection Time: 01/25/21  9:37 PM   Specimen: BLOOD  Result Value Ref Range Status   Specimen Description BLOOD BLOOD RIGHT FOREARM  Final    Special Requests   Final    BOTTLES DRAWN AEROBIC AND ANAEROBIC Blood Culture adequate volume   Culture   Final    NO GROWTH < 12 HOURS Performed at [facility], [address] [place], [place] [address]    Report Status PENDING  Incomplete         Radiology Studies: CT Angio Chest PE W and/or Wo Contrast  Result Date: 01/25/2021 CLINICAL DATA:  Shortness of breath. Started steroids last week. On home oxygen. Cough. Ex-smoker. EXAM: CT ANGIOGRAPHY CHEST WITH CONTRAST TECHNIQUE: Multidetector CT imaging of the chest was performed using the standard protocol during bolus administration of intravenous contrast. Multiplanar CT image reconstructions and MIPs were obtained to evaluate the vascular anatomy. CONTRAST:  57mL OMNIPAQUE IOHEXOL 350 MG/ML SOLN COMPARISON:  Chest radiographs, most recent earlier today. Most recent CT of 06/18/2020. FINDINGS: Cardiovascular: The quality of this exam for evaluation of pulmonary embolism is  good. The bolus is relatively well timed. No evidence of pulmonary embolism. Aortic atherosclerosis. Tortuous thoracic aorta. Mild cardiomegaly, without pericardial effusion. Multivessel coronary artery atherosclerosis. Mediastinum/Nodes: Pretracheal node measures 1.5 cm on 27/4 and is similar to on the prior exam (when remeasured). Subcarinal node measures 1.3 cm and is similar to on the prior exam. No hilar adenopathy. Lungs/Pleura: Trace left pleural fluid is similar. Advanced bullous emphysema. 4 mm right lower lobe pulmonary nodule is similar to on the prior exam, also present on 01/17/2019 and considered benign. Improved aeration since 06/18/2020. Residual cavitation in the left apex, including on 18/6. Residual left upper lobe lobe areas of patchy airspace and interstitial thickening. Similar left greater than right lower lobe interstitial thickening which is likely due to remote infection Upper Abdomen: Normal imaged portions of the liver, stomach, pancreas,  gallbladder, adrenal glands, kidneys. Old granulomatous disease in the spleen. Advanced abdominal aortic atherosclerosis. Musculoskeletal: Osteopenia. Accentuation of expected thoracic kyphosis with lower thoracic vertebral augmentation at multiple levels. Review of the MIP images confirms the above findings. IMPRESSION: 1.  No evidence of pulmonary embolism. 2. Left upper airspace disease, interstitial thickening, and cavitation, in the setting of more severe necrotic pneumonia on 06/18/2020. This could all be the sequelae of prior infection and scarring. Left upper lobe superimposed recurrent pneumonia cannot be excluded. 3. Aortic atherosclerosis (ICD10-I70.0), coronary artery atherosclerosis and emphysema (ICD10-J43.9). 4. Trace left pleural fluid is unchanged. 5. Thoracic adenopathy is similar and likely reactive. Electronically Signed   By: Abigail Miyamoto M.D.   On: 01/25/2021 20:28   DG Chest Port 1 View  Result Date: 01/25/2021 CLINICAL DATA:  Chest pain.  Shortness of breath. EXAM: PORTABLE CHEST 1 VIEW COMPARISON:  Radiograph 07/08/2020 CT 06/18/2020 FINDINGS: Chronic volume loss in the left hemithorax. Patchy increased density throughout the left hemithorax greatest in the mid and upper lung zone, not significantly changed from prior exam. The previous air-fluid level on CT is not seen by radiograph. Possible pleural thickening in the lateral left upper lung zone. Right lung emphysema and bronchial thickening without acute airspace disease. Heart size difficult to assess due to leftward cardiac shift, chronic. No pneumothorax. IMPRESSION: 1. Chronic volume loss in the left hemithorax with patchy opacity throughout the left hemithorax, not significantly changed from October 2021 exam, likely underlying scarring/fibrosis. 2. Emphysema and bronchial thickening in the right lung. 3. No definite acute findings. Electronically Signed   By: Keith Rake M.D.   On: 01/25/2021 19:11        Scheduled  Meds: . aspirin EC  81 mg Oral Daily  . clonazePAM  1 mg Oral BID  . dextromethorphan-guaiFENesin  1 tablet Oral BID  . enoxaparin (LOVENOX) injection  50 mg Subcutaneous Q24H  . levothyroxine  25 mcg Oral QAC breakfast  . methylPREDNISolone (SOLU-MEDROL) injection  40 mg Intravenous Q6H   Followed by  . [START ON 01/27/2021] predniSONE  40 mg Oral Q breakfast  . multivitamin with minerals  1 tablet Oral Daily  . pantoprazole  40 mg Oral Daily  . sucralfate  1 g Oral TID WC & HS  . tiotropium  18 mcg Inhalation Daily   Continuous Infusions: . sodium chloride 100 mL (01/25/21 2208)  . azithromycin Stopped (01/26/21 0237)  . cefTRIAXone (ROCEPHIN)  IV Stopped (01/26/21 0045)     LOS: 1 day   Time spent: 44min  Cyana Shook C Nanami Whitelaw, DO [facility]  If 7PM-7AM, please contact night-coverage [email or web address]  01/26/2021, 7:45 AM

## 2021-01-26 NOTE — ED Notes (Signed)
Report received from Mimi RN. Patient care assumed. Patient/RN introduction complete. Will continue to monitor.  

## 2021-01-27 DIAGNOSIS — J441 Chronic obstructive pulmonary disease with (acute) exacerbation: Secondary | ICD-10-CM | POA: Diagnosis not present

## 2021-01-27 DIAGNOSIS — R627 Adult failure to thrive: Secondary | ICD-10-CM

## 2021-01-27 DIAGNOSIS — J9621 Acute and chronic respiratory failure with hypoxia: Secondary | ICD-10-CM

## 2021-01-27 LAB — BASIC METABOLIC PANEL
Anion gap: 7 (ref 5–15)
BUN: 17 mg/dL (ref 8–23)
CO2: 34 mmol/L — ABNORMAL HIGH (ref 22–32)
Calcium: 8.7 mg/dL — ABNORMAL LOW (ref 8.9–10.3)
Chloride: 103 mmol/L (ref 98–111)
Creatinine, Ser: 0.65 mg/dL (ref 0.61–1.24)
GFR, Estimated: >60 mL/min (ref >60)
Glucose, Bld: 138 mg/dL — ABNORMAL HIGH (ref 70–99)
Potassium: 3.9 mmol/L (ref 3.5–5.1)
Sodium: 144 mmol/L (ref 135–145)

## 2021-01-27 LAB — CBC
HCT: 35.8 % — ABNORMAL LOW (ref 39.0–52.0)
Hemoglobin: 11 g/dL — ABNORMAL LOW (ref 13.0–17.0)
MCH: 29.2 pg (ref 26.0–34.0)
MCHC: 30.7 g/dL (ref 30.0–36.0)
MCV: 95 fL (ref 80.0–100.0)
Platelets: 250 10*3/uL (ref 150–400)
RBC: 3.77 MIL/uL — ABNORMAL LOW (ref 4.22–5.81)
RDW: 13.1 % (ref 11.5–15.5)
WBC: 11.3 10*3/uL — ABNORMAL HIGH (ref 4.0–10.5)
nRBC: 0 % (ref 0.0–0.2)

## 2021-01-27 MED ORDER — OXYCODONE HCL 5 MG PO TABS
15.0000 mg | ORAL_TABLET | Freq: Four times a day (QID) | ORAL | Status: DC | PRN
  Administered 2021-01-27 – 2021-02-01 (×14): 15 mg via ORAL
  Filled 2021-01-27 (×14): qty 3

## 2021-01-27 MED ORDER — AZITHROMYCIN 250 MG PO TABS: 500.0000 mg | ORAL_TABLET | Freq: Every day | ORAL | Status: DC

## 2021-01-27 MED ORDER — SENNOSIDES-DOCUSATE SODIUM 8.6-50 MG PO TABS
1.0000 | ORAL_TABLET | Freq: Two times a day (BID) | ORAL | Status: DC
  Administered 2021-01-27 – 2021-02-01 (×8): 1 via ORAL
  Filled 2021-01-27 (×9): qty 1

## 2021-01-27 MED ORDER — DOXYCYCLINE HYCLATE 100 MG PO TABS
100.0000 mg | ORAL_TABLET | Freq: Two times a day (BID) | ORAL | Status: DC
  Administered 2021-01-27 – 2021-01-30 (×7): 100 mg via ORAL
  Filled 2021-01-27 (×7): qty 1

## 2021-01-27 MED ORDER — FUROSEMIDE 10 MG/ML IJ SOLN
40.0000 mg | Freq: Once | INTRAMUSCULAR | Status: AC
  Administered 2021-01-28: 40 mg via INTRAVENOUS
  Filled 2021-01-27: qty 4

## 2021-01-27 NOTE — Progress Notes (Signed)
Pt reused Lasix Iv stated he needs to look up the med. This nurse explained the purpose of medication. Dr. Annamaria Boots notified and verbalized she had also explained the purpose and need of medication with pt.

## 2021-01-27 NOTE — Progress Notes (Signed)
PROGRESS NOTE    Mark [name]  EXB:[id number] DOB: July 11, 1951 DOA: 01/25/2021 PCP: Sandra Cockayne, MD   Brief Narrative:  Mark [name] is a 70 y.o. Caucasian male with medical history significant for advanced COPD with chronic respiratory failure on home O2 at 4 L/min, asthma, osteoporosis with history of necrotizing pneumonia in September of last year, who presented to the emergency room with acute onset of worsening dyspnea with associated chest tightness, as well as cough productive of yellowish sputum and wheezing which have been worsening since Wednesday.  He called his primary care doctor and had a telemedicine appointment in which she was prescribed p.o. Z-Pak and prednisone taper that he started on Tuesday.  He admitted to mild low-grade fever denies any chills.  No nausea or vomiting or abdominal pain.  No headache or dizziness or blurred vision.  No dysuria, oliguria or hematuria or flank pain.  The patient is on home hospice.   Assessment & Plan:   Active Problems:   CAP (community acquired pneumonia)  Acute COPD exacerbation , acute on chronic hypoxic respiratory failure failure of outpatient therapy. -Patient treated outpatient with steroids and Z-Pak with minimal to no improvement and worsening symptoms over the past week -significant abnormal Chest x-ray with chronic findings but no acute findings , procalcitonin less than 0.1  -Continue to wheeze, was on IV steroid , now on prednisone  -He appears volume overloaded , will give 1 dose of Lasix , monitor effect , DC Rocephin and Zithromax , changed to oral doxycycline to cover for possible bronchitis - I am not sure he has sepsis   Hypothyroidism. - Continue home Synthroid  General anxiety disorder - Continue home clonazepam  GERD -Continue home Carafate and PPI therapy.  FTT: very weak, chronically ill appearing, home with home hospice vs residential hospice pending on clinical improvement? He states he  does not wants to go to snf  DVT prophylaxis: Lovenox. Code Status: The patient is DNR/DNI -he is followed outpatient by hospice Family Communication: None present  Status is: Inpatient  Dispo: The patient is from: Home              Anticipated d/c is to: Home, patient already refusing placement at SNF facility will likely discharge back home with hospice              Anticipated d/c date is: 24-48hrs pending clinical improvement                Consultants:   None  Procedures:   None indicated  Antimicrobials:  Azithromycin, ceftriaxone x5 days -4/25 -4/27 doxcyclin from 4/27  Subjective:  Poor historian,  Audible wheezing, does appear to have sob, but kept on talking, does not stop, I did not hear any cough during encounter He wants oxycodone to increase to 15mg  q6hrs prn   Objective: Vitals:   01/27/21 0002 01/27/21 0433 01/27/21 0733 01/27/21 0952  BP: 115/66 111/80 113/67 121/78  Pulse: (!) 108 (!) 110 (!) 113 (!) 107  Resp: 18 20 20    Temp: 97.7 F (36.5 C) 97.7 F (36.5 C) (!) 97.5 F (36.4 C) 97.7 F (36.5 C)  TempSrc: Oral Oral Oral Oral  SpO2: 97% (!) 87% 94% 94%  Weight:      Height:        Intake/Output Summary (Last 24 hours) at 01/27/2021 1453 Last data filed at 01/27/2021 0643 Gross per 24 hour  Intake 360 ml  Output 200 ml  Net 160 ml  Filed Weights   01/25/21 1822  Weight: 99.8 kg    Examination:  General: disheveled,chronically ill-appearing gentleman, no acute distress, does has audible wheezing, but does not appear in respiratory distress, kept on talking. HEENT:  Normocephalic atraumatic.  Sclerae nonicteric, noninjected.  Extraocular movements intact bilaterally. Neck:  Without mass or deformity.  Trachea is midline. Lungs: Diffuse coarse of breath sounds without overt rales,  Bilateral  wheezing diffusely. Heart:  Regular rate and rhythm.  Without murmurs, rubs, or gallops. Abdomen:  Soft, nontender, nondistended.  Without  guarding or rebound. Extremities: Left greater than right foot erythema, blanching, some pitting edema bilateral lower extremity /foot Vascular:  Dorsalis pedis and posterior tibial pulses palpable bilaterally. Skin:  Warm and dry, blanching erythema bilateral feet left greater than right as above.   Data Reviewed: I have personally reviewed following labs and imaging studies  CBC: Recent Labs  Lab 01/25/21 1829 01/26/21 0624 01/27/21 0607  WBC 10.6* 5.2 11.3*  NEUTROABS 6.2  --   --   HGB 11.8* 11.1* 11.0*  HCT 37.8* 35.8* 35.8*  MCV 95.0 94.7 95.0  PLT 247 217 469   Basic Metabolic Panel: Recent Labs  Lab 01/25/21 1829 01/26/21 0624 01/27/21 0607  NA 141 139 144  K 3.8 4.0 3.9  CL 98 99 103  CO2 31 33* 34*  GLUCOSE 128* 146* 138*  BUN 29* 21 17  CREATININE 0.94 0.82 0.65  CALCIUM 9.3 8.5* 8.7*  MG 2.1  --   --    GFR: Estimated Creatinine Clearance: 103.2 mL/min (by C-G formula based on SCr of 0.65 mg/dL). Liver Function Tests: Recent Labs  Lab 01/25/21 1829  AST 28  ALT 13  ALKPHOS 62  BILITOT 0.7  PROT 6.5  ALBUMIN 3.8   No results for input(s): LIPASE, AMYLASE in the last 168 hours. No results for input(s): AMMONIA in the last 168 hours. Coagulation Profile: No results for input(s): INR, PROTIME in the last 168 hours. Cardiac Enzymes: No results for input(s): CKTOTAL, CKMB, CKMBINDEX, TROPONINI in the last 168 hours. BNP (last 3 results) No results for input(s): PROBNP in the last 8760 hours. HbA1C: No results for input(s): HGBA1C in the last 72 hours. CBG: No results for input(s): GLUCAP in the last 168 hours. Lipid Profile: No results for input(s): CHOL, HDL, LDLCALC, TRIG, CHOLHDL, LDLDIRECT in the last 72 hours. Thyroid Function Tests: No results for input(s): TSH, T4TOTAL, FREET4, T3FREE, THYROIDAB in the last 72 hours. Anemia Panel: No results for input(s): VITAMINB12, FOLATE, FERRITIN, TIBC, IRON, RETICCTPCT in the last 72 hours. Sepsis  Labs: Recent Labs  Lab 01/25/21 [place] <0.10    Recent Results (from the past 240 hour(s))  Resp Panel by RT-PCR (Flu A&B, Covid) Nasopharyngeal Swab     Status: None   Collection Time: 01/25/21  6:29 PM   Specimen: Nasopharyngeal Swab; Nasopharyngeal(NP) swabs in vial transport medium  Result Value Ref Range Status   SARS Coronavirus 2 by RT PCR NEGATIVE NEGATIVE Final    Comment: (NOTE) SARS-CoV-2 target nucleic acids are NOT DETECTED.  The SARS-CoV-2 RNA is generally detectable in upper respiratory specimens during the acute phase of infection. The lowest concentration of SARS-CoV-2 viral copies this assay can detect is 138 copies/mL. A negative result does not preclude SARS-Cov-2 infection and should not be used as the sole basis for treatment or other patient management decisions. A negative result may occur with  improper specimen collection/handling, submission of specimen other than nasopharyngeal swab, presence  of viral mutation(s) within the areas targeted by this assay, and inadequate number of viral copies(<138 copies/mL). A negative result must be combined with clinical observations, patient history, and epidemiological information. The expected result is Negative.  Fact Sheet for Patients:  EntrepreneurPulse.com.au  Fact Sheet for Healthcare Providers:  IncredibleEmployment.be  This test is no t yet approved or cleared by the [place] FDA and  has been authorized for detection and/or diagnosis of SARS-CoV-2 by FDA under an Emergency Use Authorization (EUA). This EUA will remain  in effect (meaning this test can be used) for the duration of the COVID-19 declaration under Section 564(b)(1) of the Act, 21 U.S.C.section 360bbb-3(b)(1), unless the authorization is terminated  or revoked sooner.       Influenza A by PCR NEGATIVE NEGATIVE Final   Influenza B by PCR NEGATIVE NEGATIVE Final    Comment: (NOTE) The  Xpert Xpress SARS-CoV-2/FLU/RSV plus assay is intended as an aid in the diagnosis of influenza from Nasopharyngeal swab specimens and should not be used as a sole basis for treatment. Nasal washings and aspirates are unacceptable for Xpert Xpress SARS-CoV-2/FLU/RSV testing.  Fact Sheet for Patients: EntrepreneurPulse.com.au  Fact Sheet for Healthcare Providers: IncredibleEmployment.be  This test is not yet approved or cleared by the [place] FDA and has been authorized for detection and/or diagnosis of SARS-CoV-2 by FDA under an Emergency Use Authorization (EUA). This EUA will remain in effect (meaning this test can be used) for the duration of the COVID-19 declaration under Section 564(b)(1) of the Act, 21 U.S.C. section 360bbb-3(b)(1), unless the authorization is terminated or revoked.  Performed at [facility], [address] [place], [place] [address]   Blood culture (routine x 2)     Status: None (Preliminary result)   Collection Time: 01/25/21  9:37 PM   Specimen: BLOOD  Result Value Ref Range Status   Specimen Description BLOOD LEFT ANTECUBITAL  Final   Special Requests   Final    BOTTLES DRAWN AEROBIC AND ANAEROBIC Blood Culture adequate volume   Culture   Final    NO GROWTH 2 DAYS Performed at [facility], [address] [place], [place] [address]    Report Status PENDING  Incomplete  Blood culture (routine x 2)     Status: None (Preliminary result)   Collection Time: 01/25/21  9:37 PM   Specimen: BLOOD  Result Value Ref Range Status   Specimen Description BLOOD BLOOD RIGHT FOREARM  Final   Special Requests   Final    BOTTLES DRAWN AEROBIC AND ANAEROBIC Blood Culture adequate volume   Culture   Final    NO GROWTH 2 DAYS Performed at [facility], [address], [place], [place] [address]    Report Status PENDING  Incomplete         Radiology Studies: CT Angio Chest PE W and/or Wo  Contrast  Result Date: 01/25/2021 CLINICAL DATA:  Shortness of breath. Started steroids last week. On home oxygen. Cough. Ex-smoker. EXAM: CT ANGIOGRAPHY CHEST WITH CONTRAST TECHNIQUE: Multidetector CT imaging of the chest was performed using the standard protocol during bolus administration of intravenous contrast. Multiplanar CT image reconstructions and MIPs were obtained to evaluate the vascular anatomy. CONTRAST:  70mL OMNIPAQUE IOHEXOL 350 MG/ML SOLN COMPARISON:  Chest radiographs, most recent earlier today. Most recent CT of 06/18/2020. FINDINGS: Cardiovascular: The quality of this exam for evaluation of pulmonary embolism is good. The bolus is relatively well timed. No evidence of pulmonary embolism. Aortic atherosclerosis. Tortuous  thoracic aorta. Mild cardiomegaly, without pericardial effusion. Multivessel coronary artery atherosclerosis. Mediastinum/Nodes: Pretracheal node measures 1.5 cm on 27/4 and is similar to on the prior exam (when remeasured). Subcarinal node measures 1.3 cm and is similar to on the prior exam. No hilar adenopathy. Lungs/Pleura: Trace left pleural fluid is similar. Advanced bullous emphysema. 4 mm right lower lobe pulmonary nodule is similar to on the prior exam, also present on 01/17/2019 and considered benign. Improved aeration since 06/18/2020. Residual cavitation in the left apex, including on 18/6. Residual left upper lobe lobe areas of patchy airspace and interstitial thickening. Similar left greater than right lower lobe interstitial thickening which is likely due to remote infection Upper Abdomen: Normal imaged portions of the liver, stomach, pancreas, gallbladder, adrenal glands, kidneys. Old granulomatous disease in the spleen. Advanced abdominal aortic atherosclerosis. Musculoskeletal: Osteopenia. Accentuation of expected thoracic kyphosis with lower thoracic vertebral augmentation at multiple levels. Review of the MIP images confirms the above findings. IMPRESSION: 1.   No evidence of pulmonary embolism. 2. Left upper airspace disease, interstitial thickening, and cavitation, in the setting of more severe necrotic pneumonia on 06/18/2020. This could all be the sequelae of prior infection and scarring. Left upper lobe superimposed recurrent pneumonia cannot be excluded. 3. Aortic atherosclerosis (ICD10-I70.0), coronary artery atherosclerosis and emphysema (ICD10-J43.9). 4. Trace left pleural fluid is unchanged. 5. Thoracic adenopathy is similar and likely reactive. Electronically Signed   By: Abigail Miyamoto M.D.   On: 01/25/2021 20:28   DG Chest Port 1 View  Result Date: 01/25/2021 CLINICAL DATA:  Chest pain.  Shortness of breath. EXAM: PORTABLE CHEST 1 VIEW COMPARISON:  Radiograph 07/08/2020 CT 06/18/2020 FINDINGS: Chronic volume loss in the left hemithorax. Patchy increased density throughout the left hemithorax greatest in the mid and upper lung zone, not significantly changed from prior exam. The previous air-fluid level on CT is not seen by radiograph. Possible pleural thickening in the lateral left upper lung zone. Right lung emphysema and bronchial thickening without acute airspace disease. Heart size difficult to assess due to leftward cardiac shift, chronic. No pneumothorax. IMPRESSION: 1. Chronic volume loss in the left hemithorax with patchy opacity throughout the left hemithorax, not significantly changed from October 2021 exam, likely underlying scarring/fibrosis. 2. Emphysema and bronchial thickening in the right lung. 3. No definite acute findings. Electronically Signed   By: Keith Rake M.D.   On: 01/25/2021 19:11        Scheduled Meds: . aspirin EC  81 mg Oral Daily  . clonazePAM  1 mg Oral BID  . dextromethorphan-guaiFENesin  1 tablet Oral BID  . enoxaparin (LOVENOX) injection  50 mg Subcutaneous Q24H  . levothyroxine  25 mcg Oral QAC breakfast  . multivitamin with minerals  1 tablet Oral Daily  . pantoprazole  40 mg Oral Daily  . predniSONE   40 mg Oral Q breakfast  . sucralfate  1 g Oral TID WC & HS  . tiotropium  18 mcg Inhalation Daily   Continuous Infusions: . sodium chloride 100 mL/hr at 01/27/21 0643  . azithromycin Stopped (01/26/21 2159)  . cefTRIAXone (ROCEPHIN)  IV Stopped (01/26/21 2327)     LOS: 2 days   Time spent: 12mins  Florencia Reasons, MD PhD FACP [facility]  If 7PM-7AM, please contact night-coverage [email or web address]  01/27/2021, 2:53 PM

## 2021-01-27 NOTE — Progress Notes (Signed)
[facility] ([facility] patient RN note:  Mark [name] is a current hospice patient with a terminal diagnosis of COPD. He activated EMS on 04.25 for worsening dyspnea and productive cough for several days. Hospice Social Worker notified about patient's decision to call 911. Patient was admitted to [facility] on 04.25 at 8:40 pm with a diagnosis of COPD exacerbation and pneumonia. He is a DNR. Per Dr.Walker with [facility], this is a related admission.  Visited with patient at bedside. He was alert and oriented and resting in bed. O2 was at 5 liters [place] and he had audible wheezing. He denies any pain. Discussed possiblity of patient transitioning to a facility for care but he adamantly refused.Continues to be treated with IV steroids and antibiotics. Report exchanged with hospital care team.  Vital Signs: BP 121/78; HR 107; Resp 20; Temp 97.7; O2 sats 94% on 5 liters [place]  I & O: 325ml/200ml  Abnormal Labs: CO2: 34 (H) Glucose: 138 (H) Calcium: 8.7 (L) WBC: 11.3 (H) RBC: 3.77 (L) Hemoglobin: 11.0 (L) HCT: 35.8 (L)  Diagnostics: none new  IV/PRN Meds: 0.9 % sodium chloride infusion Rate: 100 mL/hr Freq: Continuous Route: IV Start: 01/25/21 2100  azithromycin (ZITHROMAX) 500 mg in sodium chloride 0.9 % 250 mL IVPB Dose: 500 mg Freq: Every 24 hours Route: IV Last Dose: Stopped (01/26/21 2159) Start: 01/25/21 2100 End: 01/30/21 2059  cefTRIAXone (ROCEPHIN) 2 g in sodium chloride 0.9 % 100 mL IVPB Dose: 2 g Freq: Every 24 hours Route: IV Last Dose: Stopped (01/26/21 2327) Start: 01/25/21 2200 End: 01/30/21 2159  cyclobenzaprine (FLEXERIL) tablet 10 mg Dose: 10 mg Freq: 3 times daily PRN Route: PO PRN Reason: muscle spasms Start: 01/25/21 2046  guaiFENesin-codeine 100-10 MG/5ML solution 10 mL Dose: 10 mL Freq: Every 6 hours PRN Route: PO PRN Reason: cough Start: 01/26/21 2116  oxyCODONE (Oxy IR/ROXICODONE)  immediate release tablet 10 mg Dose: 10 mg Freq: Every 6 hours PRN Route: PO PRN Comment: mod -severe pain Start: 01/25/21 2046  Problem List: Active Problems:   CAP (community acquired pneumonia)  Acute COPD exacerbation like secondary to concurrent community-acquired pneumoniawith associated chronic hypoxic respiratory failure, failure of outpatient therapy. -Patient treated outpatient with steroids and Z-Pak with minimal to no improvement and worsening symptoms over the past week -Continue IV steroids, ceftriaxone azithromycin and supportive care -Patient is on 4 L nasal cannula chronically, continues on oxygen here at 4 L baseline -unclear if he is able to ambulate today although will need an oxygen walk screen prior to disposition to rule out any acute hypoxia -Unclear if infectious versus COPD exacerbation or combination given patient's markedly abnormal CT and chest x-ray; which appear similar to previous baseline  Sepsis secondary to community-acquired pneumonia.  - Patient technically meets sepsis criteria due to suspected CAP with tachycardia and tachypnea. - We will hold off hydration given elevated BNP/volume overload risk  Hypothyroidism. -Continue home Synthroid  General anxiety disorder -Continue home clonazepam  GERD -Continue home Carafate and PPI therapy.  Discharge Planning: Patient states that he wants to go home at discharge.  Family contact: Patient requests that RN does not call any of his family at this time.  Goals of Care: Clear  Patient remains GIP for continued IV antibiotics to treat his pneumonia.  Please call with any hospice related questions or concerns.  Zandra Abts, RN [facility] Liaison  [phone]

## 2021-01-28 DIAGNOSIS — J9621 Acute and chronic respiratory failure with hypoxia: Secondary | ICD-10-CM | POA: Diagnosis not present

## 2021-01-28 DIAGNOSIS — R627 Adult failure to thrive: Secondary | ICD-10-CM | POA: Diagnosis not present

## 2021-01-28 DIAGNOSIS — J441 Chronic obstructive pulmonary disease with (acute) exacerbation: Secondary | ICD-10-CM | POA: Diagnosis not present

## 2021-01-28 LAB — C-REACTIVE PROTEIN: CRP: 0.5 mg/dL (ref <1.0)

## 2021-01-28 LAB — BASIC METABOLIC PANEL
Anion gap: 7 (ref 5–15)
BUN: 24 mg/dL — ABNORMAL HIGH (ref 8–23)
CO2: 32 mmol/L (ref 22–32)
Calcium: 8.9 mg/dL (ref 8.9–10.3)
Chloride: 100 mmol/L (ref 98–111)
Creatinine, Ser: 0.77 mg/dL (ref 0.61–1.24)
GFR, Estimated: >60 mL/min (ref >60)
Glucose, Bld: 105 mg/dL — ABNORMAL HIGH (ref 70–99)
Potassium: 3.6 mmol/L (ref 3.5–5.1)
Sodium: 139 mmol/L (ref 135–145)

## 2021-01-28 LAB — CBC
HCT: 37.8 % — ABNORMAL LOW (ref 39.0–52.0)
Hemoglobin: 11.3 g/dL — ABNORMAL LOW (ref 13.0–17.0)
MCH: 29.3 pg (ref 26.0–34.0)
MCHC: 29.9 g/dL — ABNORMAL LOW (ref 30.0–36.0)
MCV: 97.9 fL (ref 80.0–100.0)
Platelets: 255 10*3/uL (ref 150–400)
RBC: 3.86 MIL/uL — ABNORMAL LOW (ref 4.22–5.81)
RDW: 13.2 % (ref 11.5–15.5)
WBC: 12.1 10*3/uL — ABNORMAL HIGH (ref 4.0–10.5)
nRBC: 0 % (ref 0.0–0.2)

## 2021-01-28 LAB — PROCALCITONIN: Procalcitonin: <0.1 ng/mL

## 2021-01-28 LAB — SEDIMENTATION RATE: Sed Rate: 20 mm/hr (ref 0–20)

## 2021-01-28 MED ORDER — IPRATROPIUM-ALBUTEROL 0.5-2.5 (3) MG/3ML IN SOLN
3.0000 mL | Freq: Three times a day (TID) | RESPIRATORY_TRACT | Status: DC
  Administered 2021-01-28: 3 mL via RESPIRATORY_TRACT
  Filled 2021-01-28: qty 3

## 2021-01-28 MED ORDER — IPRATROPIUM-ALBUTEROL 0.5-2.5 (3) MG/3ML IN SOLN
3.0000 mL | Freq: Three times a day (TID) | RESPIRATORY_TRACT | Status: DC
  Administered 2021-01-28 – 2021-02-01 (×12): 3 mL via RESPIRATORY_TRACT
  Filled 2021-01-28 (×12): qty 3

## 2021-01-28 NOTE — Progress Notes (Signed)
PROGRESS NOTE    Mark [name]  GBT:[id number] DOB: 09-Feb-1951 DOA: 01/25/2021 PCP: Sandra Cockayne, MD   Brief Narrative:  Mark [name] is a 69 y.o. Caucasian male with medical history significant for advanced COPD with chronic respiratory failure on home O2 at 4 L/min, asthma, osteoporosis with history of necrotizing pneumonia in September of last year, who presented to the emergency room with acute onset of worsening dyspnea with associated chest tightness, as well as cough productive of yellowish sputum and wheezing which have been worsening since Wednesday.  He called his primary care doctor and had a telemedicine appointment in which she was prescribed p.o. Z-Pak and prednisone taper that he started on Tuesday.  He admitted to mild low-grade fever denies any chills.  No nausea or vomiting or abdominal pain.  No headache or dizziness or blurred vision.  No dysuria, oliguria or hematuria or flank pain.  The patient is on home hospice.   Assessment & Plan:   Active Problems:   CAP (community acquired pneumonia)  Acute COPD exacerbation , acute on chronic hypoxic respiratory failure failure of outpatient therapy. -Patient treated outpatient with steroids and Z-Pak with minimal to no improvement and worsening symptoms over the past week -significant abnormal Chest x-ray with chronic findings but no acute findings , procalcitonin less than 0.1  -Continue to wheeze, was on IV steroid , now on prednisone  -He appears volume overloaded , will give 1 dose of Lasix , monitor effect , DC Rocephin and Zithromax , changed to oral doxycycline to cover for possible bronchitis - I am not sure he has sepsis -he refused lasix yesterday, he agrees to it today , over all very poor prognosis  Hypothyroidism. - Continue home Synthroid  General anxiety disorder - Continue home clonazepam  GERD -Continue home Carafate and PPI therapy.  FTT: very weak, chronically ill appearing, home with  home hospice vs residential hospice pending on clinical improvement? He states he does not wants to go to snf  DVT prophylaxis: Lovenox. Code Status: The patient is DNR/DNI -he is followed outpatient by hospice Family Communication: None present  Status is: Inpatient  Dispo: The patient is from: Home              Anticipated d/c is to: Home, patient already refusing placement at SNF facility will likely discharge back home with hospice              Anticipated d/c date is: 24-48hrs pending clinical improvement                Consultants:   None  Procedures:   None indicated  Antimicrobials:  Azithromycin, ceftriaxone x5 days -4/25 -4/27 doxcyclin from 4/27  Subjective:  Poor historian,  He refused lasix yesterday, he states will try today He continue to have Audible wheezing, appear more sob, has congested cough, but nonproductive  When I offer to call his family, he states he will call first, he will let me know if I can call his family   Objective: Vitals:   01/27/21 2234 01/27/21 2254 01/28/21 0335 01/28/21 0822  BP: 137/80  126/83 114/68  Pulse: (!) 110 (!) 107 (!) 105 (!) 107  Resp: 18  18 20   Temp: 98.2 F (36.8 C)  98.2 F (36.8 C) 97.8 F (36.6 C)  TempSrc: Oral  Oral   SpO2: 97%  91% (!) 69%  Weight:      Height:        Intake/Output Summary (Last 24  hours) at 01/28/2021 1255 Last data filed at 01/28/2021 6213 Gross per 24 hour  Intake 1518 ml  Output 300 ml  Net 1218 ml   Filed Weights   01/25/21 1822  Weight: 99.8 kg    Examination:  General: disheveled,chronically ill-appearing gentleman,  audible wheezing, appear more sob. HEENT:  Normocephalic atraumatic.  Sclerae nonicteric, noninjected.  Extraocular movements intact bilaterally. Neck:  Without mass or deformity.  Trachea is midline. Lungs: Diffuse coarse of breath sounds without overt rales,  Bilateral  wheezing diffusely. Heart:  Regular rate and rhythm.  Without murmurs, rubs, or  gallops. Abdomen:  Soft, nontender, nondistended.  Without guarding or rebound. Extremities: Left greater than right foot erythema, blanching, some pitting edema bilateral lower extremity /foot Vascular:  Dorsalis pedis and posterior tibial pulses palpable bilaterally. Skin:  Warm and dry, blanching erythema bilateral feet left greater than right as above.   Data Reviewed: I have personally reviewed following labs and imaging studies  CBC: Recent Labs  Lab 01/25/21 1829 01/26/21 0624 01/27/21 0607 01/28/21 0343  WBC 10.6* 5.2 11.3* 12.1*  NEUTROABS 6.2  --   --   --   HGB 11.8* 11.1* 11.0* 11.3*  HCT 37.8* 35.8* 35.8* 37.8*  MCV 95.0 94.7 95.0 97.9  PLT 247 217 250 086   Basic Metabolic Panel: Recent Labs  Lab 01/25/21 1829 01/26/21 0624 01/27/21 0607 01/28/21 0343  NA 141 139 144 139  K 3.8 4.0 3.9 3.6  CL 98 99 103 100  CO2 31 33* 34* 32  GLUCOSE 128* 146* 138* 105*  BUN 29* 21 17 24*  CREATININE 0.94 0.82 0.65 0.77  CALCIUM 9.3 8.5* 8.7* 8.9  MG 2.1  --   --   --    GFR: Estimated Creatinine Clearance: 103.2 mL/min (by C-G formula based on SCr of 0.77 mg/dL). Liver Function Tests: Recent Labs  Lab 01/25/21 1829  AST 28  ALT 13  ALKPHOS 62  BILITOT 0.7  PROT 6.5  ALBUMIN 3.8   No results for input(s): LIPASE, AMYLASE in the last 168 hours. No results for input(s): AMMONIA in the last 168 hours. Coagulation Profile: No results for input(s): INR, PROTIME in the last 168 hours. Cardiac Enzymes: No results for input(s): CKTOTAL, CKMB, CKMBINDEX, TROPONINI in the last 168 hours. BNP (last 3 results) No results for input(s): PROBNP in the last 8760 hours. HbA1C: No results for input(s): HGBA1C in the last 72 hours. CBG: No results for input(s): GLUCAP in the last 168 hours. Lipid Profile: No results for input(s): CHOL, HDL, LDLCALC, TRIG, CHOLHDL, LDLDIRECT in the last 72 hours. Thyroid Function Tests: No results for input(s): TSH, T4TOTAL, FREET4,  T3FREE, THYROIDAB in the last 72 hours. Anemia Panel: No results for input(s): VITAMINB12, FOLATE, FERRITIN, TIBC, IRON, RETICCTPCT in the last 72 hours. Sepsis Labs: Recent Labs  Lab 01/25/21 1829 01/28/21 0343  PROCALCITON <0.10 <0.10    Recent Results (from the past 240 hour(s))  Resp Panel by RT-PCR (Flu A&B, Covid) Nasopharyngeal Swab     Status: None   Collection Time: 01/25/21  6:29 PM   Specimen: Nasopharyngeal Swab; Nasopharyngeal(NP) swabs in vial transport medium  Result Value Ref Range Status   SARS Coronavirus 2 by RT PCR NEGATIVE NEGATIVE Final    Comment: (NOTE) SARS-CoV-2 target nucleic acids are NOT DETECTED.  The SARS-CoV-2 RNA is generally detectable in upper respiratory specimens during the acute phase of infection. The lowest concentration of SARS-CoV-2 viral copies this assay can detect is 138  copies/mL. A negative result does not preclude SARS-Cov-2 infection and should not be used as the sole basis for treatment or other patient management decisions. A negative result may occur with  improper specimen collection/handling, submission of specimen other than nasopharyngeal swab, presence of viral mutation(s) within the areas targeted by this assay, and inadequate number of viral copies(<138 copies/mL). A negative result must be combined with clinical observations, patient history, and epidemiological information. The expected result is Negative.  Fact Sheet for Patients:  EntrepreneurPulse.com.au  Fact Sheet for Healthcare Providers:  IncredibleEmployment.be  This test is no t yet approved or cleared by the [place] FDA and  has been authorized for detection and/or diagnosis of SARS-CoV-2 by FDA under an Emergency Use Authorization (EUA). This EUA will remain  in effect (meaning this test can be used) for the duration of the COVID-19 declaration under Section 564(b)(1) of the Act, 21 U.S.C.section 360bbb-3(b)(1),  unless the authorization is terminated  or revoked sooner.       Influenza A by PCR NEGATIVE NEGATIVE Final   Influenza B by PCR NEGATIVE NEGATIVE Final    Comment: (NOTE) The Xpert Xpress SARS-CoV-2/FLU/RSV plus assay is intended as an aid in the diagnosis of influenza from Nasopharyngeal swab specimens and should not be used as a sole basis for treatment. Nasal washings and aspirates are unacceptable for Xpert Xpress SARS-CoV-2/FLU/RSV testing.  Fact Sheet for Patients: EntrepreneurPulse.com.au  Fact Sheet for Healthcare Providers: IncredibleEmployment.be  This test is not yet approved or cleared by the [place] FDA and has been authorized for detection and/or diagnosis of SARS-CoV-2 by FDA under an Emergency Use Authorization (EUA). This EUA will remain in effect (meaning this test can be used) for the duration of the COVID-19 declaration under Section 564(b)(1) of the Act, 21 U.S.C. section 360bbb-3(b)(1), unless the authorization is terminated or revoked.  Performed at [facility], [address] [place], [place] [address]   Blood culture (routine x 2)     Status: None (Preliminary result)   Collection Time: 01/25/21  9:37 PM   Specimen: BLOOD  Result Value Ref Range Status   Specimen Description BLOOD LEFT ANTECUBITAL  Final   Special Requests   Final    BOTTLES DRAWN AEROBIC AND ANAEROBIC Blood Culture adequate volume   Culture   Final    NO GROWTH 2 DAYS Performed at [facility], [address] [place], [place] [address]    Report Status PENDING  Incomplete  Blood culture (routine x 2)     Status: None (Preliminary result)   Collection Time: 01/25/21  9:37 PM   Specimen: BLOOD  Result Value Ref Range Status   Specimen Description BLOOD BLOOD RIGHT FOREARM  Final   Special Requests   Final    BOTTLES DRAWN AEROBIC AND ANAEROBIC Blood Culture adequate volume   Culture   Final    NO GROWTH 2  DAYS Performed at [facility], [address] [place], [place] [address]    Report Status PENDING  Incomplete         Radiology Studies: No results found.      Scheduled Meds: . aspirin EC  81 mg Oral Daily  . clonazePAM  1 mg Oral BID  . dextromethorphan-guaiFENesin  1 tablet Oral BID  . doxycycline  100 mg Oral Q12H  . enoxaparin (LOVENOX) injection  50 mg Subcutaneous Q24H  . ipratropium-albuterol  3 mL Nebulization TID  . levothyroxine  25 mcg Oral QAC breakfast  .  multivitamin with minerals  1 tablet Oral Daily  . pantoprazole  40 mg Oral Daily  . predniSONE  40 mg Oral Q breakfast  . senna-docusate  1 tablet Oral BID  . sucralfate  1 g Oral TID WC & HS  . tiotropium  18 mcg Inhalation Daily   Continuous Infusions:    LOS: 3 days   Time spent: 50mins  Florencia Reasons, MD PhD FACP [facility]  If 7PM-7AM, please contact night-coverage [email or web address]  01/28/2021, 12:55 PM

## 2021-01-28 NOTE — Progress Notes (Addendum)
[place] ([place] patient RN note:  Mark [name] is a current hospice patient with a terminal diagnosis of COPD. He activated EMS on 04.25 for worsening dyspnea and productive cough for several days. Hospice Social Worker notified about patient's decision to call 911. Patient was admitted to [facility] on 04.25 at 8:40 pm with a diagnosis of COPD exacerbation and pneumonia. He is a DNR. Per Dr.Walker with [employer], this is a related admission.  Visited patient at bedside. He was resting quietly with eyes closed. No distressed noted. Audible wheezing heard. Report exchanged with hospital care team. Per MD, patient may discharge home tomorrow if wheezing has improved.  Vital Signs: BP 114/68; HR 107; Resp 20; Temp 97.8; O 2 sat 91% on 5 liters  I & O: 1525ml/300ml  Abnormal labs: Glucose: 105 (H) BUN: 24 (H) WBC: 12.1 (H) RBC: 3.86 (L) Hemoglobin: 11.3 (L) HCT: 37.8 (L) MCHC: 29.9 (L)  Diagnostics: none new  IV/PRN Meds: ipratropium-albuterol (DUONEB) 0.5-2.5 (3) MG/3ML nebulizer solution 3 mL Dose: 3 mL Freq: 3 times daily Route: NEBULIZATION Start: 01/28/21 1400  guaiFENesin-codeine 100-10 MG/5ML solution 10 mL Dose: 10 mL Freq: Every 6 hours PRN Route: PO PRN Reason: cough Start: 01/26/21 2116  oxyCODONE (Oxy IR/ROXICODONE) immediate release tablet 15 mg Dose: 15 mg Freq: Every 6 hours PRN Route: PO PRN Comment: mod -severe pain Start: 01/27/21 1706  furosemide (LASIX) injection 40 mg Dose: 40 mg Freq: Once Route: IV Start: 01/27/21 1745 End: 01/28/21 1238  Problem List: Active Problems:   CAP (community acquired pneumonia)  Acute COPD exacerbation , acute on chronic hypoxic respiratory failure failure of outpatient therapy. -Patient treated outpatient with steroids and Z-Pak with minimal to no improvement and worsening symptoms over the past week -significant abnormal Chest x-ray with  chronic findings but no acute findings , procalcitonin less than 0.1  -Continue to wheeze, was on IV steroid , now on prednisone  -He appears volume overloaded , will give 1 dose of Lasix , monitor effect , DC Rocephin and Zithromax , changed to oral doxycycline to cover for possible bronchitis - I am not sure he has sepsis -he refused lasix yesterday, he agrees to it today , over all very poor prognosis  Hypothyroidism. -Continue home Synthroid  General anxiety disorder -Continue home clonazepam  GERD -Continue home Carafate and PPI therapy.  FTT: very weak, chronically ill appearing, home with home hospice vs residential hospice pending on clinical improvement? He states he does not want to go to SNF.  Discharge Planning: Ongoing- plan is to go home  Family Contact: none  IDG: Updated  Patient remains GIP due to fluid volume overload and receiving IV Lasix.  Please call with any hospice related questions or concerns.  Zandra Abts, RN [facility] Liaison [phone]

## 2021-01-28 NOTE — Plan of Care (Signed)

## 2021-01-29 DIAGNOSIS — J9621 Acute and chronic respiratory failure with hypoxia: Secondary | ICD-10-CM | POA: Diagnosis not present

## 2021-01-29 DIAGNOSIS — R627 Adult failure to thrive: Secondary | ICD-10-CM | POA: Diagnosis not present

## 2021-01-29 DIAGNOSIS — J441 Chronic obstructive pulmonary disease with (acute) exacerbation: Secondary | ICD-10-CM | POA: Diagnosis not present

## 2021-01-29 LAB — PROCALCITONIN: Procalcitonin: <0.1 ng/mL

## 2021-01-29 LAB — BASIC METABOLIC PANEL
Anion gap: 10 (ref 5–15)
BUN: 21 mg/dL (ref 8–23)
CO2: 36 mmol/L — ABNORMAL HIGH (ref 22–32)
Calcium: 8.7 mg/dL — ABNORMAL LOW (ref 8.9–10.3)
Chloride: 94 mmol/L — ABNORMAL LOW (ref 98–111)
Creatinine, Ser: 0.9 mg/dL (ref 0.61–1.24)
GFR, Estimated: >60 mL/min (ref >60)
Glucose, Bld: 214 mg/dL — ABNORMAL HIGH (ref 70–99)
Potassium: 3.5 mmol/L (ref 3.5–5.1)
Sodium: 140 mmol/L (ref 135–145)

## 2021-01-29 LAB — CBC
HCT: 41 % (ref 39.0–52.0)
Hemoglobin: 12.2 g/dL — ABNORMAL LOW (ref 13.0–17.0)
MCH: 29.1 pg (ref 26.0–34.0)
MCHC: 29.8 g/dL — ABNORMAL LOW (ref 30.0–36.0)
MCV: 97.9 fL (ref 80.0–100.0)
Platelets: 246 10*3/uL (ref 150–400)
RBC: 4.19 MIL/uL — ABNORMAL LOW (ref 4.22–5.81)
RDW: 13.1 % (ref 11.5–15.5)
WBC: 9.8 10*3/uL (ref 4.0–10.5)
nRBC: 0 % (ref 0.0–0.2)

## 2021-01-29 LAB — GLUCOSE, CAPILLARY
Glucose-Capillary: 152 mg/dL — ABNORMAL HIGH (ref 70–99)
Glucose-Capillary: 136 mg/dL — ABNORMAL HIGH (ref 70–99)

## 2021-01-29 LAB — MAGNESIUM: Magnesium: 2.2 mg/dL (ref 1.7–2.4)

## 2021-01-29 MED ORDER — POLYETHYLENE GLYCOL 3350 17 G PO PACK
17.0000 g | PACK | Freq: Two times a day (BID) | ORAL | Status: DC
  Administered 2021-01-29 – 2021-02-01 (×2): 17 g via ORAL
  Filled 2021-01-29 (×4): qty 1

## 2021-01-29 MED ORDER — INSULIN ASPART 100 UNIT/ML IJ SOLN
0.0000 [IU] | Freq: Three times a day (TID) | INTRAMUSCULAR | Status: DC
  Administered 2021-02-01: 2 [IU] via SUBCUTANEOUS
  Filled 2021-01-29 (×3): qty 1

## 2021-01-29 MED ORDER — GUAIFENESIN ER 600 MG PO TB12
600.0000 mg | ORAL_TABLET | Freq: Two times a day (BID) | ORAL | Status: DC
  Administered 2021-01-29 – 2021-02-01 (×5): 600 mg via ORAL
  Filled 2021-01-29 (×5): qty 1

## 2021-01-29 MED ORDER — FUROSEMIDE 10 MG/ML IJ SOLN
40.0000 mg | Freq: Once | INTRAMUSCULAR | Status: AC
  Administered 2021-01-29: 40 mg via INTRAVENOUS
  Filled 2021-01-29: qty 4

## 2021-01-29 NOTE — Plan of Care (Signed)

## 2021-01-29 NOTE — Progress Notes (Signed)
Patient is asking for Ice cream and Icee, graham cracker. Patient educated that we can't provide him sugar snacks, he need to make healthy snack choices. Provider informed.

## 2021-01-29 NOTE — Care Management Important Message (Signed)
Important Message  Patient Details  Name: Mark [name] MRN: [id number] Date of Birth: [birth year]   Medicare Important Message Given:  Other (see comment)  Followed by hospice services.  Medicare IM withheld at this time per Medicare guidelines regarding patients discharging with hospice services.   Dannette Barbara 01/29/2021, 8:12 AM

## 2021-01-29 NOTE — Progress Notes (Signed)
[place] ([place] patient RN note:  Mark [name] is a current hospice patient with a terminal diagnosis of COPD. He activated EMS on 04.25 for worsening dyspnea and productive cough for several days. Hospice Social Worker notified about patient's decision to call 911. Patient was admitted to [facility] on 04.25 at 8:40 pm with a diagnosis of COPD exacerbation and pneumonia. He is a DNR. Per Dr.Walker with [employer], this is a related admission.  Visited with patient at bedside. He is watching TV. Respirations mildly labored and regular. No updated notes from today.  V/S: 97.8, 114/60, 105, 20, 92% on 5 lpm  Not recording Intake at this time/Output 2400  Abnormal labs:  Chloride: 94 (L) CO2: 36 (H) Glucose: 214 (H) Calcium: 8.7 (L) RBC: 4.19 (L) Hemoglobin: 12.2 (L) MCHC: 29.8 (L)  Diagnostics: None new  Meds: furosemide (LASIX) injection 40 mg IV x 1  ipratropium-albuterol (DUONEB) 0.5-2.5 (3) MG/3ML nebulizer solution 3 mL 3 times daily  guaiFENesin-codeine 100-10 MG/5ML solution 10 mL X 3 doses  oxyCODONE (Oxy IR/ROXICODONE) immediate release tablet 15 mg X 3 doses  Active Problems:   CAP (community acquired pneumonia)  Acute COPD exacerbation , acute on chronic hypoxic respiratory failure failure of outpatient therapy. -Patient treated outpatient with steroids and Z-Pak with minimal to no improvement and worsening symptoms over the past week -significant abnormal Chest x-ray with chronic findings but no acute findings , procalcitonin less than 0.1  -Continue to wheeze, was on IV steroid , now on prednisone  -He appears volume overloaded , will give 1 dose of Lasix , monitor effect , DC Rocephin and Zithromax , changed to oral doxycycline to cover for possible bronchitis - I am not sure he has sepsis -he refused lasix yesterday, he agrees to it today , over all very poor  prognosis  Hypothyroidism. -Continue home Synthroid  General anxiety disorder -Continue home clonazepam  GERD -Continue home Carafate and PPI therapy.  FTT: very weak, chronically ill appearing, home with home hospice vs residential hospice pending on clinical improvement? He states he does not wants to go to snf  Discharge Planning: return to home with the support of hospice services  Family contact: phone call with sister  IDG: updated  Patient remains GIP appropriate due to fluid overload, receiving IV Lasix, acute on chronic respiratory failure requiring  Duonebs, guaifensin and frequent monitoring.  Please call with any hospice related questions or concerns.  Thank you. Margaretmary Eddy, BSN, RN [facility] Liaison [phone]

## 2021-01-29 NOTE — Progress Notes (Signed)
PROGRESS NOTE    Mark Patchesimothy Shafer  ZOX:[id number] DOB: 01/16/1951 DOA: 01/25/2021 PCP: Patterson Hammersmitholford, Cristin M, MD   Brief Narrative:  Mark [name] is a 70 y.o. Caucasian male with medical history significant for advanced COPD with chronic respiratory failure on home O2 at 4 L/min, asthma, osteoporosis with history of necrotizing pneumonia in September of last year, who presented to the emergency room with acute onset of worsening dyspnea with associated chest tightness, as well as cough productive of yellowish sputum and wheezing which have been worsening since Wednesday.  He called his primary care doctor and had a telemedicine appointment in which she was prescribed p.o. Z-Pak and prednisone taper that he started on Tuesday.  He admitted to mild low-grade fever denies any chills.  No nausea or vomiting or abdominal pain.  No headache or dizziness or blurred vision.  No dysuria, oliguria or hematuria or flank pain.  The patient is on home hospice.   Assessment & Plan:   Active Problems:   CAP (community acquired pneumonia)  Acute COPD exacerbation , acute on chronic hypoxic respiratory failure failure of outpatient therapy. -Patient treated outpatient with steroids and Z-Pak with minimal to no improvement and worsening symptoms over the past week -significant abnormal Chest x-ray with chronic findings but no acute findings , procalcitonin less than 0.1,  There is no  Fever, he does not appear septic -Blood culture no growth -Flu screen negative, COVID-19 screen negative -Urine Legionella antigen negative, urine strep pneumo antigen negative -He received Rocephin and Zithromax initially, abx changed to oral doxycycline to cover for possible bronchitis, and to minimize volume -was on IV steroid , now on prednisone  - He appears volume overloaded , improved with iv Lasixx1 on 4/28, will give another dose today , echocardiogram ordered per patient's request    Impaired fasting blood  glucose Likely due to steroid He refused insulin sliding scale Wean steroid to off Check A1c  Hypothyroidism. - Continue home Synthroid  General anxiety disorder - Continue home clonazepam  GERD -Continue home Carafate and PPI therapy.  FTT: very weak, chronically ill appearing, home with home hospice  He states he does not wants to go to snf  DVT prophylaxis: Lovenox. Code Status: The patient is DNR/DNI -he is followed outpatient by hospice Family Communication: None present, he repeatedly declined my offer to call his sister  Status is: Inpatient  Dispo: The patient is from: Home              Anticipated d/c is to: Home with home hospice ,patient already refusing placement at SNF               Anticipated d/c date is: 24-48hrs pending clinical improvement                Consultants:   Hospice  Procedures:   None indicated  Antimicrobials:  Azithromycin, ceftriaxone x5 days -4/25 -4/27 doxcyclin from 4/27  Subjective:  Poor historian, no audible wheezing today, respiratory status appear has improved,  2.4 L urine output last 24 hours He denies chest pain, no fever, he request echocardiogram to be done to evaluate heart failure  He refused insulin sliding scales, he asks for ice cream   He continues to decline my offer to call her sister  Per RN " We offered bath, asked him to sit in recliner, use the bathroom. But he does not want to He said don't give me an order. I do what I want to do"  Objective: Vitals:   01/28/21 1947 01/29/21 0401 01/29/21 0410 01/29/21 0807  BP: 116/80 (!) 112/59  114/60  Pulse: (!) 117 (!) 111  (!) 105  Resp: 16 16  20   Temp: 98.9 F (37.2 C) 98.2 F (36.8 C)  97.8 F (36.6 C)  TempSrc:    Oral  SpO2: 91% (!) 82% 91% 92%  Weight:      Height:        Intake/Output Summary (Last 24 hours) at 01/29/2021 1658 Last data filed at 01/29/2021 0045 Gross per 24 hour  Intake --  Output 1300 ml  Net -1300 ml    Filed Weights   01/25/21 1822  Weight: 99.8 kg    Examination:  General: disheveled,chronically ill-appearing gentleman,  audible wheezing has resolved, less sob HEENT:  Normocephalic atraumatic.  Sclerae nonicteric, noninjected.  Extraocular movements intact bilaterally. Neck:  Without mass or deformity.  Trachea is midline. Lungs: No wheezing, improved air entry Heart:  Regular rate and rhythm.  Without murmurs, rubs, or gallops. Abdomen:  Soft, nontender, nondistended.  Without guarding or rebound. Extremities: Left greater than right foot erythema, blanching, some pitting edema bilateral lower extremity /foot Vascular:  Dorsalis pedis and posterior tibial pulses palpable bilaterally. Skin:  Warm and dry, blanching erythema bilateral feet left greater than right as above.   Data Reviewed: I have personally reviewed following labs and imaging studies  CBC: Recent Labs  Lab 01/25/21 1829 01/26/21 0624 01/27/21 0607 01/28/21 0343 01/29/21 0426  WBC 10.6* 5.2 11.3* 12.1* 9.8  NEUTROABS 6.2  --   --   --   --   HGB 11.8* 11.1* 11.0* 11.3* 12.2*  HCT 37.8* 35.8* 35.8* 37.8* 41.0  MCV 95.0 94.7 95.0 97.9 97.9  PLT 247 217 250 255 973   Basic Metabolic Panel: Recent Labs  Lab 01/25/21 1829 01/26/21 0624 01/27/21 0607 01/28/21 0343 01/29/21 0426  NA 141 139 144 139 140  K 3.8 4.0 3.9 3.6 3.5  CL 98 99 103 100 94*  CO2 31 33* 34* 32 36*  GLUCOSE 128* 146* 138* 105* 214*  BUN 29* 21 17 24* 21  CREATININE 0.94 0.82 0.65 0.77 0.90  CALCIUM 9.3 8.5* 8.7* 8.9 8.7*  MG 2.1  --   --   --  2.2   GFR: Estimated Creatinine Clearance: 91.7 mL/min (by C-G formula based on SCr of 0.9 mg/dL). Liver Function Tests: Recent Labs  Lab 01/25/21 1829  AST 28  ALT 13  ALKPHOS 62  BILITOT 0.7  PROT 6.5  ALBUMIN 3.8   No results for input(s): LIPASE, AMYLASE in the last 168 hours. No results for input(s): AMMONIA in the last 168 hours. Coagulation Profile: No results for  input(s): INR, PROTIME in the last 168 hours. Cardiac Enzymes: No results for input(s): CKTOTAL, CKMB, CKMBINDEX, TROPONINI in the last 168 hours. BNP (last 3 results) No results for input(s): PROBNP in the last 8760 hours. HbA1C: No results for input(s): HGBA1C in the last 72 hours. CBG: No results for input(s): GLUCAP in the last 168 hours. Lipid Profile: No results for input(s): CHOL, HDL, LDLCALC, TRIG, CHOLHDL, LDLDIRECT in the last 72 hours. Thyroid Function Tests: No results for input(s): TSH, T4TOTAL, FREET4, T3FREE, THYROIDAB in the last 72 hours. Anemia Panel: No results for input(s): VITAMINB12, FOLATE, FERRITIN, TIBC, IRON, RETICCTPCT in the last 72 hours. Sepsis Labs: Recent Labs  Lab 01/25/21 1829 01/28/21 0343 01/29/21 0426  PROCALCITON <0.10 <0.10 <0.10    Recent Results (from the  past 240 hour(s))  Resp Panel by RT-PCR (Flu A&B, Covid) Nasopharyngeal Swab     Status: None   Collection Time: 01/25/21  6:29 PM   Specimen: Nasopharyngeal Swab; Nasopharyngeal(NP) swabs in vial transport medium  Result Value Ref Range Status   SARS Coronavirus 2 by RT PCR NEGATIVE NEGATIVE Final    Comment: (NOTE) SARS-CoV-2 target nucleic acids are NOT DETECTED.  The SARS-CoV-2 RNA is generally detectable in upper respiratory specimens during the acute phase of infection. The lowest concentration of SARS-CoV-2 viral copies this assay can detect is 138 copies/mL. A negative result does not preclude SARS-Cov-2 infection and should not be used as the sole basis for treatment or other patient management decisions. A negative result may occur with  improper specimen collection/handling, submission of specimen other than nasopharyngeal swab, presence of viral mutation(s) within the areas targeted by this assay, and inadequate number of viral copies(<138 copies/mL). A negative result must be combined with clinical observations, patient history, and epidemiological information. The  expected result is Negative.  Fact Sheet for Patients:  EntrepreneurPulse.com.au  Fact Sheet for Healthcare Providers:  IncredibleEmployment.be  This test is no t yet approved or cleared by the [place] FDA and  has been authorized for detection and/or diagnosis of SARS-CoV-2 by FDA under an Emergency Use Authorization (EUA). This EUA will remain  in effect (meaning this test can be used) for the duration of the COVID-19 declaration under Section 564(b)(1) of the Act, 21 U.S.C.section 360bbb-3(b)(1), unless the authorization is terminated  or revoked sooner.       Influenza A by PCR NEGATIVE NEGATIVE Final   Influenza B by PCR NEGATIVE NEGATIVE Final    Comment: (NOTE) The Xpert Xpress SARS-CoV-2/FLU/RSV plus assay is intended as an aid in the diagnosis of influenza from Nasopharyngeal swab specimens and should not be used as a sole basis for treatment. Nasal washings and aspirates are unacceptable for Xpert Xpress SARS-CoV-2/FLU/RSV testing.  Fact Sheet for Patients: EntrepreneurPulse.com.au  Fact Sheet for Healthcare Providers: IncredibleEmployment.be  This test is not yet approved or cleared by the [place] FDA and has been authorized for detection and/or diagnosis of SARS-CoV-2 by FDA under an Emergency Use Authorization (EUA). This EUA will remain in effect (meaning this test can be used) for the duration of the COVID-19 declaration under Section 564(b)(1) of the Act, 21 U.S.C. section 360bbb-3(b)(1), unless the authorization is terminated or revoked.  Performed at [facility] [address] [place], [place] [address]   Blood culture (routine x 2)     Status: None (Preliminary result)   Collection Time: 01/25/21  9:37 PM   Specimen: BLOOD  Result Value Ref Range Status   Specimen Description BLOOD LEFT ANTECUBITAL  Final   Special Requests   Final    BOTTLES DRAWN  AEROBIC AND ANAEROBIC Blood Culture adequate volume   Culture   Final    NO GROWTH 4 DAYS Performed at [facility], [address], [place], [place] [address]    Report Status PENDING  Incomplete  Blood culture (routine x 2)     Status: None (Preliminary result)   Collection Time: 01/25/21  9:37 PM   Specimen: BLOOD  Result Value Ref Range Status   Specimen Description BLOOD BLOOD RIGHT FOREARM  Final   Special Requests   Final    BOTTLES DRAWN AEROBIC AND ANAEROBIC Blood Culture adequate volume   Culture   Final    NO GROWTH 4 DAYS Performed at [facility], [address] [place], [place] [address]    Report Status PENDING  Incomplete         Radiology Studies: No results found.      Scheduled Meds: . aspirin EC  81 mg Oral Daily  . clonazePAM  1 mg Oral BID  . doxycycline  100 mg Oral Q12H  . enoxaparin (LOVENOX) injection  50 mg Subcutaneous Q24H  . furosemide  40 mg Intravenous Once  . guaiFENesin  600 mg Oral BID  . insulin aspart  0-15 Units Subcutaneous TID WC  . ipratropium-albuterol  3 mL Nebulization TID  . levothyroxine  25 mcg Oral QAC breakfast  . multivitamin with minerals  1 tablet Oral Daily  . pantoprazole  40 mg Oral Daily  . polyethylene glycol  17 g Oral BID  . predniSONE  40 mg Oral Q breakfast  . senna-docusate  1 tablet Oral BID  . sucralfate  1 g Oral TID WC & HS  . tiotropium  18 mcg Inhalation Daily   Continuous Infusions:    LOS: 4 days   Time spent: 30mins  Florencia Reasons, MD PhD FACP [facility]  If 7PM-7AM, please contact night-coverage [email or web address]  01/29/2021, 4:58 PM

## 2021-01-29 NOTE — TOC Initial Note (Signed)
Transition of Care [facility]) - Initial/Assessment Note    Patient Details  Name: Mark [name] MRN: [id number] Date of Birth: Oct 09, 1950  Transition of [facility]) CM/SW Contact:    Candie Chroman, LCSW Phone Number: 01/29/2021, 4:56 PM  Clinical Narrative: CSW met with patient. No supports at bedside. CSW introduced role and explained that discharge planning would be discussed. Patient confirmed he is active with home hospice services through [facility]. Patient reports he is "not accepting defeat" and has questions about "treatment options on the market." Notified MD. Per hospice liaison, patient declined hospital bed. Patient will need EMS transport home. Address on facesheet is complete. He is unsure if his door locked or not but will coordinator with his sister and landlord to confirm. No further concerns. CSW encouraged patient to contact CSW as needed. CSW will continue to follow patient for support and facilitate return home when stable.                Expected Discharge Plan: Home w Hospice Care Barriers to Discharge: Continued Medical Work up   Patient Goals and CMS Choice        Expected Discharge Plan and Services Expected Discharge Plan: [place] Acute Care Choice: Resumption of Svcs/PTA Provider Living arrangements for the past 2 months: Single Family Home                                      Prior Living Arrangements/Services Living arrangements for the past 2 months: Single Family Home Lives with:: Self Patient language and need for interpreter reviewed:: Yes Do you feel safe going back to the place where you live?: Yes      Need for Family Participation in Patient Care: Yes (Comment) Care giver support system in place?: Yes (comment) Current home services: DME Criminal Activity/Legal Involvement Pertinent to Current Situation/Hospitalization: No - Comment as needed  Activities of Daily Living Home Assistive Devices/Equipment: Cane  (specify quad or straight),Eyeglasses,BIPAP ADL Screening (condition at time of admission) Patient's cognitive ability adequate to safely complete daily activities?: Yes Is the patient deaf or have difficulty hearing?: No Does the patient have difficulty seeing, even when wearing glasses/contacts?: No Does the patient have difficulty concentrating, remembering, or making decisions?: No Patient able to express need for assistance with ADLs?: Yes Does the patient have difficulty dressing or bathing?: No Independently performs ADLs?: Yes (appropriate for developmental age) Does the patient have difficulty walking or climbing stairs?: Yes Weakness of Legs: Both Weakness of Arms/Hands: None  Permission Sought/Granted Permission sought to share information with : Facility Art therapist granted to share information with : Yes, Verbal Permission Granted     Permission granted to share info w AGENCY: [facility]        Emotional Assessment Appearance:: Appears stated age Attitude/Demeanor/Rapport: Engaged,Gracious Affect (typically observed): Accepting,Appropriate,Calm,Pleasant Orientation: : Oriented to Self,Oriented to Place,Oriented to  Time,Oriented to Situation Alcohol / Substance Use: Not Applicable Psych Involvement: No (comment)  Admission diagnosis:  SOB (shortness of breath) [R06.02] CAP (community acquired pneumonia) [J18.9] Chronic anemia [D64.9] Elevated brain natriuretic peptide (BNP) level [R79.89] Chronic respiratory failure with hypoxia (HCC) [J96.11] Troponin I above reference range [R77.8] Community acquired pneumonia of left upper lobe of lung [J18.9] Patient Active Problem List   Diagnosis Date Noted  . CAP (community acquired pneumonia) 01/25/2021  . Cavitary pneumonia   . Acute on chronic  respiratory failure with hypoxia and hypercapnia ([place])   . Adjustment disorder with anxious mood 07/04/2020  . Maculopapular rash, generalized   . Right  arm cellulitis   . COPD exacerbation ([place])   . Chronic respiratory failure with hypoxia ([place])   . Abscess of left lung with pneumonia ([place]) 06/19/2020  . Necrotizing pneumonia ([place]) 06/18/2020  . Chest pain 01/17/2019  . Hypothyroidism 01/17/2019  . Hypokalemia 01/10/2017  . Elevated C-reactive protein (CRP) 01/10/2017  . Elevated sed rate 01/10/2017  . Chronic hip pain (Location of Tertiary source of pain) (Bilateral) (L>R) 01/10/2017  . Chronic pain syndrome 10/24/2016  . Chronic back pain (Location of Primary Source of Pain) (Bilateral) (R>L) 10/24/2016  . Disturbance of skin sensation 05/05/2016  . Peripheral neuropathy 05/05/2016  . Chronic knee pain (Bilateral) (L>R) 05/05/2016  . Chronic shoulder pain (Bilateral) (R>L) 05/05/2016  . Opioid-induced constipation (OIC) 05/05/2016  . Restless leg syndrome 05/05/2016  . Mediastinal adenopathy 05/05/2016  . Pulmonary nodule (7 mm right lower lobe) 05/05/2016  . Inguinal hernia without obstruction (Right) 05/05/2016  . 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 Spasm of back muscles 05/05/2016  . Chronic musculoskeletal pain 05/05/2016  . Neurogenic pain 05/05/2016  . Neuropathic pain 05/05/2016  . Sacral back pain (Location of Secondary source of pain) (Bilateral) (L>R) 05/05/2016  . Logorrhea 05/05/2016  . Chronic lower extremity pain (Bilateral) (L>R) 05/05/2016  . Chronic neck pain (posterior) (L>R) 05/05/2016  . 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 Cervical facet hypertrophy (C2-3) (Bilateral) (L>R) 05/05/2016  . Cervical facet syndrome (Bilateral) (L>R) 05/05/2016  . 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) 05/05/2016  . Compression fracture of L2 ([place]) (seen on 11/25/2014 x-ray) 05/05/2016  . Anemia, chronic disease 05/05/2016  . Lumbar facet syndrome (Bilateral) (R>L) 05/05/2016  . Opiate use (75 MME/Day) 03/14/2016   . Long term prescription opiate use 03/14/2016  . Long term current use of opiate analgesic 03/14/2016  . Long term prescription benzodiazepine use 03/14/2016  . History of marijuana use 03/14/2016  . Compression fractures (L4, L3, L1, T12 and T11) 06/26/2015  . Acquired atrophy of thyroid 06/26/2015  . OP (osteoporosis) 06/26/2015  . Other long term (current) drug therapy 07/12/2013  . Pain medication agreement broken 07/12/2013  . Clinical depression 01/20/2005  . Esophagitis, reflux 06/01/2004  . Current tobacco use 06/01/2004  . Anxiety 05/27/2004  . 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 [place] [address] Phone: [phone] Fax: [phone]     Social Determinants of Health (SDOH) Interventions    Readmission Risk Interventions No flowsheet data found.

## 2021-01-29 NOTE — Progress Notes (Signed)
The patient were asked to sit on recliner for a few minute or an hour (if he can handle it), patient refused that and he was also asked to change the gown, change bed sheet, his bed seems dirty and has urine smell.   We asked if he can let us change the bed but he refused it. He said "don't give me an order and tell me what to do". Patient educated that we are not giving order to him. We are asking you to change position and it would help him. But he refused everything.

## 2021-01-29 NOTE — Progress Notes (Signed)
Patient was offered bath and changed clothes. Patient refused

## 2021-01-30 DIAGNOSIS — J9621 Acute and chronic respiratory failure with hypoxia: Secondary | ICD-10-CM | POA: Diagnosis not present

## 2021-01-30 LAB — CBC
HCT: 42.9 % (ref 39.0–52.0)
Hemoglobin: 12.9 g/dL — ABNORMAL LOW (ref 13.0–17.0)
MCH: 29.3 pg (ref 26.0–34.0)
MCHC: 30.1 g/dL (ref 30.0–36.0)
MCV: 97.5 fL (ref 80.0–100.0)
Platelets: 245 10*3/uL (ref 150–400)
RBC: 4.4 MIL/uL (ref 4.22–5.81)
RDW: 12.5 % (ref 11.5–15.5)
WBC: 15.5 10*3/uL — ABNORMAL HIGH (ref 4.0–10.5)
nRBC: 0 % (ref 0.0–0.2)

## 2021-01-30 LAB — CULTURE, BLOOD (ROUTINE X 2)
Culture: NO GROWTH
Culture: NO GROWTH
Special Requests: ADEQUATE
Special Requests: ADEQUATE

## 2021-01-30 LAB — BASIC METABOLIC PANEL
Anion gap: 9 (ref 5–15)
BUN: 22 mg/dL (ref 8–23)
CO2: 43 mmol/L — ABNORMAL HIGH (ref 22–32)
Calcium: 9.1 mg/dL (ref 8.9–10.3)
Chloride: 88 mmol/L — ABNORMAL LOW (ref 98–111)
Creatinine, Ser: 0.85 mg/dL (ref 0.61–1.24)
GFR, Estimated: >60 mL/min (ref >60)
Glucose, Bld: 131 mg/dL — ABNORMAL HIGH (ref 70–99)
Potassium: 3.5 mmol/L (ref 3.5–5.1)
Sodium: 140 mmol/L (ref 135–145)

## 2021-01-30 LAB — MAGNESIUM: Magnesium: 2.3 mg/dL (ref 1.7–2.4)

## 2021-01-30 LAB — GLUCOSE, CAPILLARY
Glucose-Capillary: 133 mg/dL — ABNORMAL HIGH (ref 70–99)
Glucose-Capillary: 153 mg/dL — ABNORMAL HIGH (ref 70–99)
Glucose-Capillary: 139 mg/dL — ABNORMAL HIGH (ref 70–99)
Glucose-Capillary: 175 mg/dL — ABNORMAL HIGH (ref 70–99)

## 2021-01-30 MED ORDER — POTASSIUM CHLORIDE CRYS ER 20 MEQ PO TBCR
40.0000 meq | EXTENDED_RELEASE_TABLET | Freq: Once | ORAL | Status: AC
  Administered 2021-01-30: 40 meq via ORAL
  Filled 2021-01-30: qty 2

## 2021-01-30 MED ORDER — IPRATROPIUM-ALBUTEROL 0.5-2.5 (3) MG/3ML IN SOLN
RESPIRATORY_TRACT | Status: AC
  Filled 2021-01-30: qty 3

## 2021-01-30 NOTE — Progress Notes (Signed)
PROGRESS NOTE    Mark [name]  ZOX:[id number] DOB: 10-Mar-1951 DOA: 01/25/2021 PCP: Sandra Cockayne, MD   Brief Narrative:  Mark [name] is a 70 y.o. Caucasian male with medical history significant for advanced COPD with chronic respiratory failure on home O2 at 4 L/min, asthma, osteoporosis with history of necrotizing pneumonia in September of last year, who presented to the emergency room with acute onset of worsening dyspnea with associated chest tightness, as well as cough productive of yellowish sputum and wheezing which have been worsening since Wednesday.  He called his primary care doctor and had a telemedicine appointment in which she was prescribed p.o. Z-Pak and prednisone taper that he started on Tuesday.  He admitted to mild low-grade fever denies any chills.  No nausea or vomiting or abdominal pain.  No headache or dizziness or blurred vision.  No dysuria, oliguria or hematuria or flank pain.  The patient is on home hospice.   Assessment & Plan:   Active Problems:   CAP (community acquired pneumonia)  Acute COPD exacerbation , acute on chronic hypoxic respiratory failure failure of outpatient therapy. -Patient treated outpatient with steroids and Z-Pak with minimal to no improvement and worsening symptoms over the past week -significant abnormal Chest x-ray with chronic findings but no acute findings , procalcitonin less than 0.1,  There is no  Fever, he does not appear septic -Blood culture no growth -Flu screen negative, COVID-19 screen negative -Urine Legionella antigen negative, urine strep pneumo antigen negative -He received Rocephin and Zithromax initially, abx changed to oral doxycycline to cover for possible bronchitis, and to minimize volume -was on IV steroid , now on prednisone , last dose today on 4/30 - He appears volume overloaded , improved with iv Lasixx1 on 4/28 and iv lasix x1 on 4/29 , echocardiogram ordered per patient's request -he does not feel  ready to go home, he refused to go to snf    Impaired fasting blood glucose Likely due to steroid He refused insulin sliding scale Wean steroid to off, last dose on 4/30 A1c in process  Hypothyroidism. - Continue home Synthroid  General anxiety disorder - Continue home clonazepam  GERD -Continue home Carafate and PPI therapy.  FTT: very weak, chronically ill appearing, home with home hospice  He states he does not wants to go to snf  DVT prophylaxis: Lovenox. Code Status: The patient is DNR/DNI -he is followed outpatient by hospice Family Communication: None present, he repeatedly declined my offer to call his sister  Status is: Inpatient  Dispo: The patient is from: Home              Anticipated d/c is to: Home with home hospice ,patient  refusing placement at SNF               Anticipated d/c date is: 24-48hrs , he requested echocardiogram, he does not feel ready to go home today                Consultants:   Hospice  Procedures:   None indicated  Antimicrobials:  Azithromycin, ceftriaxone x5 days -4/25 -4/27 doxcyclin from 4/27  Subjective:  Poor historian, no audible wheezing today, respiratory status appear has improved,  1 L urine output last 24 hours He denies chest pain, no fever, he requests echocardiogram to be done to evaluate heart failure  He continues to decline my offer to call her sister He does not feel ready to go home today, he does not want to go  to snf No bm, denies ab pain, no n/v, he declined miralax      Objective: Vitals:   01/30/21 0516 01/30/21 1103 01/30/21 1239 01/30/21 1244  BP: 131/85 129/81 133/81   Pulse: 98 (!) 104 (!) 116 (!) 110  Resp: 18 20 20    Temp: 98.7 F (37.1 C) 98.4 F (36.9 C) 98 F (36.7 C)   TempSrc:  Oral Oral   SpO2: 98% (!) 89% 91% 90%  Weight:      Height:        Intake/Output Summary (Last 24 hours) at 01/30/2021 1344 Last data filed at 01/30/2021 0800 Gross per 24 hour  Intake --   Output 1450 ml  Net -1450 ml   Filed Weights   01/25/21 1822  Weight: 99.8 kg    Examination:  General: disheveled,chronically ill-appearing gentleman,  audible wheezing has resolved, less sob HEENT:  Normocephalic atraumatic.  Sclerae nonicteric, noninjected.  Extraocular movements intact bilaterally. Neck:  Without mass or deformity.  Trachea is midline. Lungs: No wheezing, improved air entry Heart:  Regular rate and rhythm.  Without murmurs, rubs, or gallops. Abdomen:  Soft, nontender, nondistended.  Without guarding or rebound. Extremities: Left greater than right foot erythema, blanching, some pitting edema bilateral lower extremity /foot Vascular:  Dorsalis pedis and posterior tibial pulses palpable bilaterally. Skin:  Warm and dry, blanching erythema bilateral feet left greater than right as above.   Data Reviewed: I have personally reviewed following labs and imaging studies  CBC: Recent Labs  Lab 01/25/21 1829 01/26/21 0624 01/27/21 0607 01/28/21 0343 01/29/21 0426 01/30/21 0401  WBC 10.6* 5.2 11.3* 12.1* 9.8 15.5*  NEUTROABS 6.2  --   --   --   --   --   HGB 11.8* 11.1* 11.0* 11.3* 12.2* 12.9*  HCT 37.8* 35.8* 35.8* 37.8* 41.0 42.9  MCV 95.0 94.7 95.0 97.9 97.9 97.5  PLT 247 217 250 255 246 378   Basic Metabolic Panel: Recent Labs  Lab 01/25/21 1829 01/26/21 0624 01/27/21 0607 01/28/21 0343 01/29/21 0426 01/30/21 0401  NA 141 139 144 139 140 140  K 3.8 4.0 3.9 3.6 3.5 3.5  CL 98 99 103 100 94* 88*  CO2 31 33* 34* 32 36* 43*  GLUCOSE 128* 146* 138* 105* 214* 131*  BUN 29* 21 17 24* 21 22  CREATININE 0.94 0.82 0.65 0.77 0.90 0.85  CALCIUM 9.3 8.5* 8.7* 8.9 8.7* 9.1  MG 2.1  --   --   --  2.2 2.3   GFR: Estimated Creatinine Clearance: 97.1 mL/min (by C-G formula based on SCr of 0.85 mg/dL). Liver Function Tests: Recent Labs  Lab 01/25/21 1829  AST 28  ALT 13  ALKPHOS 62  BILITOT 0.7  PROT 6.5  ALBUMIN 3.8   No results for input(s):  LIPASE, AMYLASE in the last 168 hours. No results for input(s): AMMONIA in the last 168 hours. Coagulation Profile: No results for input(s): INR, PROTIME in the last 168 hours. Cardiac Enzymes: No results for input(s): CKTOTAL, CKMB, CKMBINDEX, TROPONINI in the last 168 hours. BNP (last 3 results) No results for input(s): PROBNP in the last 8760 hours. HbA1C: No results for input(s): HGBA1C in the last 72 hours. CBG: Recent Labs  Lab 01/29/21 1704 01/29/21 2204 01/30/21 0725 01/30/21 1138  GLUCAP 152* 136* 133* 153*   Lipid Profile: No results for input(s): CHOL, HDL, LDLCALC, TRIG, CHOLHDL, LDLDIRECT in the last 72 hours. Thyroid Function Tests: No results for input(s): TSH, T4TOTAL, FREET4, T3FREE,  THYROIDAB in the last 72 hours. Anemia Panel: No results for input(s): VITAMINB12, FOLATE, FERRITIN, TIBC, IRON, RETICCTPCT in the last 72 hours. Sepsis Labs: Recent Labs  Lab 01/25/21 1829 01/28/21 0343 01/29/21 0426  PROCALCITON <0.10 <0.10 <0.10    Recent Results (from the past 240 hour(s))  Resp Panel by RT-PCR (Flu A&B, Covid) Nasopharyngeal Swab     Status: None   Collection Time: 01/25/21  6:29 PM   Specimen: Nasopharyngeal Swab; Nasopharyngeal(NP) swabs in vial transport medium  Result Value Ref Range Status   SARS Coronavirus 2 by RT PCR NEGATIVE NEGATIVE Final    Comment: (NOTE) SARS-CoV-2 target nucleic acids are NOT DETECTED.  The SARS-CoV-2 RNA is generally detectable in upper respiratory specimens during the acute phase of infection. The lowest concentration of SARS-CoV-2 viral copies this assay can detect is 138 copies/mL. A negative result does not preclude SARS-Cov-2 infection and should not be used as the sole basis for treatment or other patient management decisions. A negative result may occur with  improper specimen collection/handling, submission of specimen other than nasopharyngeal swab, presence of viral mutation(s) within the areas targeted by  this assay, and inadequate number of viral copies(<138 copies/mL). A negative result must be combined with clinical observations, patient history, and epidemiological information. The expected result is Negative.  Fact Sheet for Patients:  BloggerCourse.com  Fact Sheet for Healthcare Providers:  SeriousBroker.it  This test is no t yet approved or cleared by the [place] FDA and  has been authorized for detection and/or diagnosis of SARS-CoV-2 by FDA under an Emergency Use Authorization (EUA). This EUA will remain  in effect (meaning this test can be used) for the duration of the COVID-19 declaration under Section 564(b)(1) of the Act, 21 U.S.C.section 360bbb-3(b)(1), unless the authorization is terminated  or revoked sooner.       Influenza A by PCR NEGATIVE NEGATIVE Final   Influenza B by PCR NEGATIVE NEGATIVE Final    Comment: (NOTE) The Xpert Xpress SARS-CoV-2/FLU/RSV plus assay is intended as an aid in the diagnosis of influenza from Nasopharyngeal swab specimens and should not be used as a sole basis for treatment. Nasal washings and aspirates are unacceptable for Xpert Xpress SARS-CoV-2/FLU/RSV testing.  Fact Sheet for Patients: BloggerCourse.com  Fact Sheet for Healthcare Providers: SeriousBroker.it  This test is not yet approved or cleared by the [place] FDA and has been authorized for detection and/or diagnosis of SARS-CoV-2 by FDA under an Emergency Use Authorization (EUA). This EUA will remain in effect (meaning this test can be used) for the duration of the COVID-19 declaration under Section 564(b)(1) of the Act, 21 U.S.C. section 360bbb-3(b)(1), unless the authorization is terminated or revoked.  Performed at [facility], [address] [place], [place] [address]   Blood culture (routine x 2)     Status: None   Collection Time: 01/25/21   9:37 PM   Specimen: BLOOD  Result Value Ref Range Status   Specimen Description BLOOD LEFT ANTECUBITAL  Final   Special Requests   Final    BOTTLES DRAWN AEROBIC AND ANAEROBIC Blood Culture adequate volume   Culture   Final    NO GROWTH 5 DAYS Performed at [facility], [address] [place], [place] [address]    Report Status 01/30/2021 FINAL  Final  Blood culture (routine x 2)     Status: None   Collection Time: 01/25/21  9:37 PM   Specimen: BLOOD  Result Value Ref Range Status  Specimen Description BLOOD BLOOD RIGHT FOREARM  Final   Special Requests   Final    BOTTLES DRAWN AEROBIC AND ANAEROBIC Blood Culture adequate volume   Culture   Final    NO GROWTH 5 DAYS Performed at [facility], [address] [place], [place] [address]    Report Status 01/30/2021 FINAL  Final         Radiology Studies: No results found.      Scheduled Meds: . aspirin EC  81 mg Oral Daily  . clonazePAM  1 mg Oral BID  . doxycycline  100 mg Oral Q12H  . enoxaparin (LOVENOX) injection  50 mg Subcutaneous Q24H  . guaiFENesin  600 mg Oral BID  . insulin aspart  0-15 Units Subcutaneous TID WC  . ipratropium-albuterol  3 mL Nebulization TID  . levothyroxine  25 mcg Oral QAC breakfast  . multivitamin with minerals  1 tablet Oral Daily  . pantoprazole  40 mg Oral Daily  . polyethylene glycol  17 g Oral BID  . senna-docusate  1 tablet Oral BID  . sucralfate  1 g Oral TID WC & HS  . tiotropium  18 mcg Inhalation Daily   Continuous Infusions:    LOS: 5 days   Time spent: 52mins  Florencia Reasons, MD PhD FACP [facility]  If 7PM-7AM, please contact night-coverage [email or web address]  01/30/2021, 1:44 PM

## 2021-01-31 ENCOUNTER — Inpatient Hospital Stay (HOSPITAL_COMMUNITY)
Admit: 2021-01-31 | Discharge: 2021-01-31 | Disposition: A | Discharge status: Home / Self Care | Attending: Internal Medicine | Admitting: Internal Medicine

## 2021-01-31 DIAGNOSIS — J9621 Acute and chronic respiratory failure with hypoxia: Secondary | ICD-10-CM | POA: Diagnosis not present

## 2021-01-31 DIAGNOSIS — R627 Adult failure to thrive: Secondary | ICD-10-CM | POA: Diagnosis not present

## 2021-01-31 DIAGNOSIS — I5030 Unspecified diastolic (congestive) heart failure: Secondary | ICD-10-CM | POA: Diagnosis not present

## 2021-01-31 DIAGNOSIS — J441 Chronic obstructive pulmonary disease with (acute) exacerbation: Secondary | ICD-10-CM | POA: Diagnosis not present

## 2021-01-31 LAB — ECHOCARDIOGRAM COMPLETE
AR max vel: 1.99 cm2
AV Area VTI: 2.09 cm2
AV Area mean vel: 1.82 cm2
AV Mean grad: 3 mmHg
AV Peak grad: 4.7 mmHg
Ao pk vel: 1.08 m/s
Calc EF: 34.7 %
Height: 70 in
MV VTI: 2.28 cm2
S' Lateral: 4.96 cm
Single Plane A2C EF: 24.9 %
Single Plane A4C EF: 33.3 %
Weight: 3520 oz

## 2021-01-31 LAB — URINE DRUG SCREEN, QUALITATIVE (ARMC ONLY)
Amphetamines, Ur Screen: NOT DETECTED
Barbiturates, Ur Screen: NOT DETECTED
Benzodiazepine, Ur Scrn: POSITIVE — AB
Cannabinoid 50 Ng, Ur ~~LOC~~: NOT DETECTED
Cocaine Metabolite,Ur ~~LOC~~: NOT DETECTED
MDMA (Ecstasy)Ur Screen: NOT DETECTED
Methadone Scn, Ur: NOT DETECTED
Opiate, Ur Screen: POSITIVE — AB
Phencyclidine (PCP) Ur S: NOT DETECTED
Tricyclic, Ur Screen: POSITIVE — AB

## 2021-01-31 LAB — BASIC METABOLIC PANEL
Anion gap: 9 (ref 5–15)
BUN: 22 mg/dL (ref 8–23)
CO2: 40 mmol/L — ABNORMAL HIGH (ref 22–32)
Calcium: 9.3 mg/dL (ref 8.9–10.3)
Chloride: 88 mmol/L — ABNORMAL LOW (ref 98–111)
Creatinine, Ser: 0.67 mg/dL (ref 0.61–1.24)
GFR, Estimated: >60 mL/min (ref >60)
Glucose, Bld: 132 mg/dL — ABNORMAL HIGH (ref 70–99)
Potassium: 3.8 mmol/L (ref 3.5–5.1)
Sodium: 137 mmol/L (ref 135–145)

## 2021-01-31 LAB — BLOOD GAS, ARTERIAL
Acid-Base Excess: 22.3 mmol/L — ABNORMAL HIGH (ref 0.0–2.0)
Allens test (pass/fail): POSITIVE — AB
Bicarbonate: 51.9 mmol/L — ABNORMAL HIGH (ref 20.0–28.0)
FIO2: 0.44
O2 Saturation: 95.6 %
Patient temperature: 37
pCO2 arterial: 80 mmHg (ref 32.0–48.0)
pH, Arterial: 7.42 (ref 7.350–7.450)
pO2, Arterial: 78 mmHg — ABNORMAL LOW (ref 83.0–108.0)

## 2021-01-31 LAB — GLUCOSE, CAPILLARY
Glucose-Capillary: 139 mg/dL — ABNORMAL HIGH (ref 70–99)
Glucose-Capillary: 120 mg/dL — ABNORMAL HIGH (ref 70–99)
Glucose-Capillary: 151 mg/dL — ABNORMAL HIGH (ref 70–99)
Glucose-Capillary: 91 mg/dL (ref 70–99)
Glucose-Capillary: 107 mg/dL — ABNORMAL HIGH (ref 70–99)

## 2021-01-31 LAB — CBC
HCT: 43.7 % (ref 39.0–52.0)
Hemoglobin: 13 g/dL (ref 13.0–17.0)
MCH: 28.7 pg (ref 26.0–34.0)
MCHC: 29.7 g/dL — ABNORMAL LOW (ref 30.0–36.0)
MCV: 96.5 fL (ref 80.0–100.0)
Platelets: 260 10*3/uL (ref 150–400)
RBC: 4.53 MIL/uL (ref 4.22–5.81)
RDW: 12.3 % (ref 11.5–15.5)
WBC: 9 10*3/uL (ref 4.0–10.5)
nRBC: 0 % (ref 0.0–0.2)

## 2021-01-31 LAB — HEMOGLOBIN A1C
Hgb A1c MFr Bld: 6 % — ABNORMAL HIGH (ref 4.8–5.6)
Mean Plasma Glucose: 125.5 mg/dL

## 2021-01-31 MED ORDER — TAMSULOSIN HCL 0.4 MG PO CAPS
0.4000 mg | ORAL_CAPSULE | Freq: Every day | ORAL | Status: DC
  Administered 2021-01-31 – 2021-02-01 (×2): 0.4 mg via ORAL
  Filled 2021-01-31 (×2): qty 1

## 2021-01-31 MED ORDER — ACETAZOLAMIDE 250 MG PO TABS
250.0000 mg | ORAL_TABLET | Freq: Two times a day (BID) | ORAL | Status: DC
  Administered 2021-01-31 – 2021-02-01 (×3): 250 mg via ORAL
  Filled 2021-01-31 (×4): qty 1

## 2021-01-31 MED ORDER — DOXYCYCLINE HYCLATE 100 MG PO TABS
100.0000 mg | ORAL_TABLET | Freq: Two times a day (BID) | ORAL | Status: AC
  Administered 2021-01-31 – 2021-02-01 (×2): 100 mg via ORAL
  Filled 2021-01-31 (×2): qty 1

## 2021-01-31 MED ORDER — NALOXONE HCL 0.4 MG/ML IJ SOLN
0.4000 mg | INTRAMUSCULAR | Status: DC | PRN
  Administered 2021-01-31: 0.4 mg via INTRAVENOUS
  Filled 2021-01-31: qty 1

## 2021-01-31 MED ORDER — PERFLUTREN LIPID MICROSPHERE
1.0000 mL | INTRAVENOUS | Status: AC | PRN
  Administered 2021-01-31: 3 mL via INTRAVENOUS
  Filled 2021-01-31: qty 10

## 2021-01-31 NOTE — Progress Notes (Signed)
Condition same

## 2021-01-31 NOTE — Progress Notes (Addendum)
PROGRESS NOTE    Mark [name]  KDX:[id number] DOB: [birth year] DOA: 01/25/2021 PCP: Sandra Cockayne, MD   Brief Narrative:  Mark [name] is a 69 y.o. Caucasian male with medical history significant for advanced COPD with chronic respiratory failure on home O2 at 4 L/min,  with history of necrotizing pneumonia in September of last year, who presented to the emergency room with acute onset of worsening dyspnea with associated chest tightness, as well as cough productive of yellowish sputum and wheezing which have been worsening since Wednesday.  He called his primary care doctor and had a telemedicine appointment in which she was prescribed p.o. Z-Pak and prednisone taper that he started on Tuesday.    The patient is on home hospice.   Assessment & Plan:   Active Problems:   CAP (community acquired pneumonia)  Acute COPD exacerbation , acute on chronic hypoxic respiratory failure failure of outpatient therapy. -Patient treated outpatient with steroids and Z-Pak with minimal to no improvement and worsening symptoms over the past week -significant abnormal Chest x-ray with chronic findings but no acute findings , procalcitonin less than 0.1,  There is no  Fever, he does not appear septic -Blood culture no growth -Flu screen negative, COVID-19 screen negative -Urine Legionella antigen negative, urine strep pneumo antigen negative -He received Rocephin and Zithromax initially, abx changed to oral doxycycline to cover for possible bronchitis (last dose on 5/2) -was on IV steroid , then prednisone , last dose today on 4/30 - He appears volume overloaded , improved with iv Lasixx1 on 4/28 and iv lasix x1 on 4/29 , echocardiogram ordered per patient's request -wheezing has improved, now with elevated bicarb on bmp, will give a trial of diamox -he does not feel ready to go home, he refused to go to snf  He became very lethargic on 5/1 am, RN reports he was taking meds from his bag , stat  abg ph 7.4 chronic co2 retention, stat ekg no acute changes, Qtc wnl , prn narcan ordered, he later woke up in pm  Acute urinary retention RN reports [id number]  on bladder scan on 5/1 In and out cath, start flomax, avoid constipation, increase activity     Impaired fasting blood glucose Likely due to steroid He refused insulin sliding scale Wean steroid to off, last dose on 4/30 A1c in process  Hypothyroidism. - Continue home Synthroid  General anxiety disorder - Continue home clonazepam  GERD -Continue home Carafate and PPI therapy.  FTT: very weak, chronically ill appearing, home with home hospice  He states he does not wants to go to snf  DVT prophylaxis: Lovenox. Code Status: The patient is DNR/DNI -he is followed outpatient by hospice Family Communication: None present, he repeatedly declined my offer to call his sister  Status is: Inpatient  Dispo: The patient is from: Home              Anticipated d/c is to: Home with home hospice ,patient  refusing placement at SNF               Anticipated d/c date is: 24-48hrs , he requested echocardiogram, he does not feel ready to go home today                Consultants:   Hospice  Procedures:   In and out cath   Antimicrobials:  Azithromycin, ceftriaxone x5 days -4/25 -4/27 doxcyclin from 4/27  Subjective:  Poor historian,  he is seen when he is having echocardiogram done,  does appear drowsy but carrying conversation And follow commands  Wheezing has resolved,   He continues to decline my offer to call her sister He does not feel ready to go home today, he does not want to go to snf No bm, denies ab pain, no n/v, he declined miralax      Objective: Vitals:   01/31/21 0852 01/31/21 1052 01/31/21 1120 01/31/21 1634  BP: 135/85 110/64 121/71 132/77  Pulse: (!) 108 (!) 102 (!) 101 97  Resp: 14 13    Temp: 98.9 F (37.2 C) 98.1 F (36.7 C) 98.7 F (37.1 C) 97.6 F (36.4 C)  TempSrc: Oral Oral     SpO2: 91% 95% 97% 95%  Weight:      Height:        Intake/Output Summary (Last 24 hours) at 01/31/2021 1731 Last data filed at 01/31/2021 0600 Gross per 24 hour  Intake 680 ml  Output 1810 ml  Net -1130 ml   Filed Weights   01/25/21 1822  Weight: 99.8 kg    Examination:  General: disheveled,chronically ill-appearing gentleman,  audible wheezing has resolved, respiratory effort now appears normal HEENT:  Normocephalic atraumatic.  Sclerae nonicteric, noninjected.  Extraocular movements intact bilaterally. Neck:  Without mass or deformity.  Trachea is midline. Lungs: No wheezing, improved air entry Heart:  Regular rate and rhythm.  Without murmurs, rubs, or gallops. Abdomen:  Soft, nontender, nondistended.  Without guarding or rebound. Extremities:  pitting edema bilateral lower extremity /foot has improved  Vascular:  Dorsalis pedis and posterior tibial pulses palpable bilaterally. Skin:  Warm and dry, blanching erythema bilateral feet left greater than right has improved    Data Reviewed: I have personally reviewed following labs and imaging studies  CBC: Recent Labs  Lab 01/25/21 1829 01/26/21 0624 01/27/21 0607 01/28/21 0343 01/29/21 0426 01/30/21 0401 01/31/21 0406  WBC 10.6*   < > 11.3* 12.1* 9.8 15.5* 9.0  NEUTROABS 6.2  --   --   --   --   --   --   HGB 11.8*   < > 11.0* 11.3* 12.2* 12.9* 13.0  HCT 37.8*   < > 35.8* 37.8* 41.0 42.9 43.7  MCV 95.0   < > 95.0 97.9 97.9 97.5 96.5  PLT 247   < > 250 255 246 245 260   < > = values in this interval not displayed.   Basic Metabolic Panel: Recent Labs  Lab 01/25/21 1829 01/26/21 0624 01/27/21 0607 01/28/21 0343 01/29/21 0426 01/30/21 0401 01/31/21 0406  NA 141   < > 144 139 140 140 137  K 3.8   < > 3.9 3.6 3.5 3.5 3.8  CL 98   < > 103 100 94* 88* 88*  CO2 31   < > 34* 32 36* 43* 40*  GLUCOSE 128*   < > 138* 105* 214* 131* 132*  BUN 29*   < > 17 24* 21 22 22   CREATININE 0.94   < > 0.65 0.77 0.90 0.85 0.67   CALCIUM 9.3   < > 8.7* 8.9 8.7* 9.1 9.3  MG 2.1  --   --   --  2.2 2.3  --    < > = values in this interval not displayed.   GFR: Estimated Creatinine Clearance: 103.2 mL/min (by C-G formula based on SCr of 0.67 mg/dL). Liver Function Tests: Recent Labs  Lab 01/25/21 1829  AST 28  ALT 13  ALKPHOS 62  BILITOT 0.7  PROT 6.5  ALBUMIN 3.8   No results for input(s): LIPASE, AMYLASE in the last 168 hours. No results for input(s): AMMONIA in the last 168 hours. Coagulation Profile: No results for input(s): INR, PROTIME in the last 168 hours. Cardiac Enzymes: No results for input(s): CKTOTAL, CKMB, CKMBINDEX, TROPONINI in the last 168 hours. BNP (last 3 results) No results for input(s): PROBNP in the last 8760 hours. HbA1C: No results for input(s): HGBA1C in the last 72 hours. CBG: Recent Labs  Lab 01/30/21 2144 01/31/21 0750 01/31/21 0855 01/31/21 1119 01/31/21 1633  GLUCAP 175* 139* 120* 151* 91   Lipid Profile: No results for input(s): CHOL, HDL, LDLCALC, TRIG, CHOLHDL, LDLDIRECT in the last 72 hours. Thyroid Function Tests: No results for input(s): TSH, T4TOTAL, FREET4, T3FREE, THYROIDAB in the last 72 hours. Anemia Panel: No results for input(s): VITAMINB12, FOLATE, FERRITIN, TIBC, IRON, RETICCTPCT in the last 72 hours. Sepsis Labs: Recent Labs  Lab 01/25/21 1829 01/28/21 0343 01/29/21 0426  PROCALCITON <0.10 <0.10 <0.10    Recent Results (from the past 240 hour(s))  Resp Panel by RT-PCR (Flu A&B, Covid) Nasopharyngeal Swab     Status: None   Collection Time: 01/25/21  6:29 PM   Specimen: Nasopharyngeal Swab; Nasopharyngeal(NP) swabs in vial transport medium  Result Value Ref Range Status   SARS Coronavirus 2 by RT PCR NEGATIVE NEGATIVE Final    Comment: (NOTE) SARS-CoV-2 target nucleic acids are NOT DETECTED.  The SARS-CoV-2 RNA is generally detectable in upper respiratory specimens during the acute phase of infection. The lowest concentration of  SARS-CoV-2 viral copies this assay can detect is 138 copies/mL. A negative result does not preclude SARS-Cov-2 infection and should not be used as the sole basis for treatment or other patient management decisions. A negative result may occur with  improper specimen collection/handling, submission of specimen other than nasopharyngeal swab, presence of viral mutation(s) within the areas targeted by this assay, and inadequate number of viral copies(<138 copies/mL). A negative result must be combined with clinical observations, patient history, and epidemiological information. The expected result is Negative.  Fact Sheet for Patients:  EntrepreneurPulse.com.au  Fact Sheet for Healthcare Providers:  IncredibleEmployment.be  This test is no t yet approved or cleared by the [place] FDA and  has been authorized for detection and/or diagnosis of SARS-CoV-2 by FDA under an Emergency Use Authorization (EUA). This EUA will remain  in effect (meaning this test can be used) for the duration of the COVID-19 declaration under Section 564(b)(1) of the Act, 21 U.S.C.section 360bbb-3(b)(1), unless the authorization is terminated  or revoked sooner.       Influenza A by PCR NEGATIVE NEGATIVE Final   Influenza B by PCR NEGATIVE NEGATIVE Final    Comment: (NOTE) The Xpert Xpress SARS-CoV-2/FLU/RSV plus assay is intended as an aid in the diagnosis of influenza from Nasopharyngeal swab specimens and should not be used as a sole basis for treatment. Nasal washings and aspirates are unacceptable for Xpert Xpress SARS-CoV-2/FLU/RSV testing.  Fact Sheet for Patients: EntrepreneurPulse.com.au  Fact Sheet for Healthcare Providers: IncredibleEmployment.be  This test is not yet approved or cleared by the [place] FDA and has been authorized for detection and/or diagnosis of SARS-CoV-2 by FDA under an Emergency Use  Authorization (EUA). This EUA will remain in effect (meaning this test can be used) for the duration of the COVID-19 declaration under Section 564(b)(1) of the Act, 21 U.S.C. section 360bbb-3(b)(1), unless the authorization is terminated or revoked.  Performed at [facility], [place]  [address] [place], [place] [address]   Blood culture (routine x 2)     Status: None   Collection Time: 01/25/21  9:37 PM   Specimen: BLOOD  Result Value Ref Range Status   Specimen Description BLOOD LEFT ANTECUBITAL  Final   Special Requests   Final    BOTTLES DRAWN AEROBIC AND ANAEROBIC Blood Culture adequate volume   Culture   Final    NO GROWTH 5 DAYS Performed at [facility], [address] [place], [place] [address]    Report Status 01/30/2021 FINAL  Final  Blood culture (routine x 2)     Status: None   Collection Time: 01/25/21  9:37 PM   Specimen: BLOOD  Result Value Ref Range Status   Specimen Description BLOOD BLOOD RIGHT FOREARM  Final   Special Requests   Final    BOTTLES DRAWN AEROBIC AND ANAEROBIC Blood Culture adequate volume   Culture   Final    NO GROWTH 5 DAYS Performed at [facility], [address] [place], [place] [address]    Report Status 01/30/2021 FINAL  Final         Radiology Studies: ECHOCARDIOGRAM COMPLETE  Result Date: 01/31/2021    ECHOCARDIOGRAM REPORT   Patient Name:   ADIB WAHBA Date of Exam: 01/31/2021 Medical Rec #:  [id number]        Height:       70.0 in Accession #:    [id number]       Weight:       220.0 lb Date of Birth:  April 07, 1951         BSA:          2.174 m Patient Age:    22 years         BP:           140/88 mmHg Patient Gender: M                HR:           104 bpm. Exam Location:  [facility] Procedure: 2D Echo and Intracardiac Opacification Agent Indications:    CHF; SOB  History:        Patient has no prior history of Echocardiogram examinations.                 COPD.  Sonographer:    L Thornton-Maynard Referring  Phys: [id number] XIPJ Lidwina Kaner Diagnosing      Kate Sable MD Phys:  Sonographer Comments: Suboptimal apical window. Image acquisition challenging due to COPD. IMPRESSIONS  1. Left ventricular ejection fraction, by estimation, is 25 to 30%. The left ventricle has severely decreased function. The left ventricle demonstrates global hypokinesis. Left ventricular diastolic parameters are consistent with Grade I diastolic dysfunction (impaired relaxation).  2. Right ventricular systolic function is low normal. The right ventricular size is normal.  3. The mitral valve is normal in structure. No evidence of mitral valve regurgitation.  4. The aortic valve is tricuspid. Aortic valve regurgitation is not visualized. FINDINGS  Left Ventricle: Left ventricular ejection fraction, by estimation, is 25 to 30%. The left ventricle has severely decreased function. The left ventricle demonstrates global hypokinesis. Definity contrast agent was given IV to delineate the left ventricular endocardial borders. The left ventricular internal cavity size was normal in size. There is no left ventricular hypertrophy. Left ventricular diastolic parameters are consistent with Grade I diastolic dysfunction (impaired relaxation). Right Ventricle: The right ventricular size is normal. No increase in right ventricular  wall thickness. Right ventricular systolic function is low normal. Left Atrium: Left atrial size was normal in size. Right Atrium: Right atrial size was normal in size. Pericardium: There is no evidence of pericardial effusion. Mitral Valve: The mitral valve is normal in structure. No evidence of mitral valve regurgitation. MV peak gradient, 3.5 mmHg. The mean mitral valve gradient is 2.0 mmHg. Tricuspid Valve: The tricuspid valve is normal in structure. Tricuspid valve regurgitation is not demonstrated. Aortic Valve: The aortic valve is tricuspid. Aortic valve regurgitation is not visualized. Aortic valve mean gradient measures 3.0  mmHg. Aortic valve peak gradient measures 4.7 mmHg. Aortic valve area, by VTI measures 2.09 cm. Pulmonic Valve: The pulmonic valve was normal in structure. Pulmonic valve regurgitation is not visualized. Aorta: The aortic root is normal in size and structure. Venous: The inferior vena cava was not well visualized. IAS/Shunts: No atrial level shunt detected by color flow Doppler.  LEFT VENTRICLE PLAX 2D LVIDd:         5.38 cm      Diastology LVIDs:         4.96 cm      LV e' medial:    4.90 cm/s LV PW:         1.02 cm      LV E/e' medial:  12.2 LV IVS:        1.16 cm      LV e' lateral:   5.33 cm/s LVOT diam:     2.40 cm      LV E/e' lateral: 11.2 LV SV:         34 LV SV Index:   16 LVOT Area:     4.52 cm  LV Volumes (MOD) LV vol d, MOD A2C: 177.0 ml LV vol d, MOD A4C: 168.0 ml LV vol s, MOD A2C: 133.0 ml LV vol s, MOD A4C: 112.0 ml LV SV MOD A2C:     44.0 ml LV SV MOD A4C:     168.0 ml LV SV MOD BP:      63.3 ml RIGHT VENTRICLE RV S prime:     13.40 cm/s TAPSE (M-mode): 2.2 cm LEFT ATRIUM             Index LA diam:        4.00 cm 1.84 cm/m LA Vol (A2C):   57.4 ml 26.41 ml/m LA Vol (A4C):   33.8 ml 15.55 ml/m LA Biplane Vol: 43.8 ml 20.15 ml/m  AORTIC VALVE                   PULMONIC VALVE AV Area (Vmax):    1.99 cm    PV Vmax:       0.74 m/s AV Area (Vmean):   1.82 cm    PV Peak grad:  2.2 mmHg AV Area (VTI):     2.09 cm AV Vmax:           108.00 cm/s AV Vmean:          78.700 cm/s AV VTI:            0.163 m AV Peak Grad:      4.7 mmHg AV Mean Grad:      3.0 mmHg LVOT Vmax:         47.60 cm/s LVOT Vmean:        31.700 cm/s LVOT VTI:          0.075 m LVOT/AV VTI ratio: 0.46  AORTA Ao Root diam: 4.30 cm  Ao Asc diam:  3.70 cm MITRAL VALVE MV Area VTI:  2.28 cm      SHUNTS MV Peak grad: 3.5 mmHg      Systemic VTI:  0.08 m MV Mean grad: 2.0 mmHg      Systemic Diam: 2.40 cm MV Vmax:      0.93 m/s MV Vmean:     60.5 cm/s MV E velocity: 59.60 cm/s MV A velocity: 101.00 cm/s MV E/A ratio:  0.59 Kate Sable MD  Electronically signed by Kate Sable MD Signature Date/Time: 01/31/2021/4:39:33 PM    Final         Scheduled Meds: . acetaZOLAMIDE  250 mg Oral BID  . aspirin EC  81 mg Oral Daily  . clonazePAM  1 mg Oral BID  . doxycycline  100 mg Oral Q12H  . enoxaparin (LOVENOX) injection  50 mg Subcutaneous Q24H  . guaiFENesin  600 mg Oral BID  . insulin aspart  0-15 Units Subcutaneous TID WC  . ipratropium-albuterol  3 mL Nebulization TID  . levothyroxine  25 mcg Oral QAC breakfast  . multivitamin with minerals  1 tablet Oral Daily  . pantoprazole  40 mg Oral Daily  . polyethylene glycol  17 g Oral BID  . senna-docusate  1 tablet Oral BID  . sucralfate  1 g Oral TID WC & HS  . tamsulosin  0.4 mg Oral Daily  . tiotropium  18 mcg Inhalation Daily   Continuous Infusions:    LOS: 6 days   Time spent: 26mins  Florencia Reasons, MD PhD FACP [facility]  If 7PM-7AM, please contact night-coverage [email or web address]  01/31/2021, 5:31 PM

## 2021-01-31 NOTE — Progress Notes (Signed)
   01/31/21 0852  Assess: MEWS Score  Temp 98.9 F (37.2 C)  BP 135/85  Pulse Rate (!) 108  Resp 14  Level of Consciousness Responds to Voice  SpO2 91 %  O2 Device Nasal Cannula  O2 Flow Rate (L/min) 5 L/min  Assess: MEWS Score  MEWS Temp 0  MEWS Systolic 0  MEWS Pulse 1  MEWS RR 0  MEWS LOC 1  MEWS Score 2  MEWS Score Color Yellow  Assess: if the MEWS score is Yellow or Red  Were vital signs taken at a resting state? Yes  Treat  MEWS Interventions Consulted Respiratory Therapy  Pain Scale 0-10  Pain Score Asleep  Take Vital Signs  Increase Vital Sign Frequency  Yellow: Q 2hr X 2 then Q 4hr X 2, if remains yellow, continue Q 4hrs  Escalate  MEWS: Escalate Yellow: discuss with charge nurse/RN and consider discussing with provider and RRT  Notify: Charge Nurse/RN  Name of Charge Nurse/RN Notified Claiborne Billings  Date Charge Nurse/RN Notified 01/31/21  Time Charge Nurse/RN Notified [phone]  Notify: Provider  Provider Name/Title Xu  Date Provider Notified 01/31/21  Time Provider Notified [phone]  Notification Type Page  Notification Reason Change in status  Provider response See new orders  Date of Provider Response 01/31/21  Time of Provider Response [phone]

## 2021-01-31 NOTE — TOC Progression Note (Signed)
Transition of Care [facility]) - Progression Note    Patient Details  Name: Dina Mobley MRN: [id number] Date of Birth: Feb 19, 1951  Transition of Care [facility]) CM/SW [place], LCSW Phone Number: 01/31/2021, 9:44 AM  Clinical Narrative:   TOC following for DC home with hospice. Per MD patient not medically ready today.    Expected Discharge Plan: Home w Hospice Care Barriers to Discharge: Continued Medical Work up  Expected Discharge Plan and Services Expected Discharge Plan: [place] Acute Care Choice: Resumption of Svcs/PTA Provider Living arrangements for the past 2 months: Single Family Home                                       Social Determinants of Health (SDOH) Interventions    Readmission Risk Interventions No flowsheet data found.

## 2021-01-31 NOTE — Progress Notes (Signed)
In to give patient morning medications and noted patient asleep difficult to awaken.  Blood sugar rechecked and at 120 prior was 139.  Found pills in bag in bed and noted  And duffle bag with additional medications.  Patient stated that he took the medications but unsure of how much.  Patient too sleepy to eat or take medications

## 2021-01-31 NOTE — Progress Notes (Signed)
Patient HR showed 116 due to patient non stop talking, patient advised not to talk when we take his vitals signs. Patient refused to calm down and kept talk.  After providing education patient was able to stop talking and patient HR came down to 110. Charge nurse Georgina Peer notified.

## 2021-01-31 NOTE — Plan of Care (Signed)
Pt talkative. Anxious. Refuses to take Lovenox Subq and turn. ST on the monitor 100's-110's BP stable Safety measures in place. Pt currently on 5L [place] increased from 4L [place]. Pt states feel beter on 5L. Will continue to monitor.   Problem: Education: Goal: Knowledge of General Education information will improve Description: Including pain rating scale, medication(s)/side effects and non-pharmacologic comfort measures Outcome: Progressing   Problem: Health Behavior/Discharge Planning: Goal: Ability to manage health-related needs will improve Outcome: Progressing   Problem: Clinical Measurements: Goal: Ability to maintain clinical measurements within normal limits will improve Outcome: Progressing Goal: Will remain free from infection Outcome: Progressing Goal: Diagnostic test results will improve Outcome: Progressing Goal: Respiratory complications will improve Outcome: Progressing Goal: Cardiovascular complication will be avoided Outcome: Progressing   Problem: Activity: Goal: Risk for activity intolerance will decrease Outcome: Progressing   Problem: Nutrition: Goal: Adequate nutrition will be maintained Outcome: Progressing   Problem: Coping: Goal: Level of anxiety will decrease Outcome: Progressing   Problem: Elimination: Goal: Will not experience complications related to bowel motility Outcome: Progressing Goal: Will not experience complications related to urinary retention Outcome: Progressing   Problem: Safety: Goal: Ability to remain free from injury will improve Outcome: Progressing   Problem: Skin Integrity: Goal: Risk for impaired skin integrity will decrease Outcome: Progressing

## 2021-01-31 NOTE — Plan of Care (Signed)

## 2021-01-31 NOTE — Progress Notes (Signed)
Patient is requesting his PRN oxycodone. This medication was given to the patient already however he states that he cannot remember. When this nurse assessed his pain location, he refused to give details and states "give me my medications now". Patient is re-educated on the [facility] and informed that the  medication is not available due to close administration times.Marland Kitchen PRN tylenol 650mg  and cyclobenzprine 10mg  is offered to assist with pain. Patient declines and began to use foul language with this nurse. Nurse Robert Bellow  is requested to come into the room as a witness to verify medication administration. [place] educated the patient on medication frequency and she also wittiness  the patient declining other pain medication and he also does not endorse pain location, intensity, or scale.

## 2021-02-01 DIAGNOSIS — D649 Anemia, unspecified: Secondary | ICD-10-CM

## 2021-02-01 DIAGNOSIS — J9611 Chronic respiratory failure with hypoxia: Secondary | ICD-10-CM | POA: Diagnosis not present

## 2021-02-01 DIAGNOSIS — I2781 Cor pulmonale (chronic): Secondary | ICD-10-CM

## 2021-02-01 DIAGNOSIS — J189 Pneumonia, unspecified organism: Secondary | ICD-10-CM | POA: Diagnosis not present

## 2021-02-01 LAB — GLUCOSE, CAPILLARY: Glucose-Capillary: 141 mg/dL — ABNORMAL HIGH (ref 70–99)

## 2021-02-01 MED ORDER — TAMSULOSIN HCL 0.4 MG PO CAPS: 0.4000 mg | ORAL_CAPSULE | Freq: Every day | ORAL | 0 refills | Status: AC

## 2021-02-01 MED ORDER — OXYCODONE HCL 10 MG PO TABS: 10.0000 mg | ORAL_TABLET | Freq: Four times a day (QID) | ORAL | 0 refills | Status: AC | PRN

## 2021-02-01 MED ORDER — GUAIFENESIN ER 600 MG PO TB12: 600.0000 mg | ORAL_TABLET | Freq: Two times a day (BID) | ORAL | 0 refills | Status: AC

## 2021-02-01 MED ORDER — FUROSEMIDE 20 MG PO TABS
20.0000 mg | ORAL_TABLET | Freq: Every day | ORAL | Status: DC
  Filled 2021-02-01: qty 1

## 2021-02-01 MED ORDER — FUROSEMIDE 20 MG PO TABS
20.0000 mg | ORAL_TABLET | ORAL | 0 refills | Status: AC
Start: 2021-02-01 — End: ?

## 2021-02-01 NOTE — Progress Notes (Signed)
Asked patient if he was ready to wear go to sleep and wear his CPAP.  Patient was awake, watching television and was not ready for CPAP at the moment.

## 2021-02-01 NOTE — Progress Notes (Signed)
All of his belongs returned to him including [place] bag of medications.  IV removed  Report given to EMS

## 2021-02-01 NOTE — Discharge Summary (Signed)
Mark [name] NAME: Mark [name]    MR#:  [id number]  DATE OF BIRTH:  02/19/1951  DATE OF ADMISSION:  01/25/2021 ADMITTING PHYSICIAN: Christel Mormon, MD  DATE OF DISCHARGE: 02/01/2021  PRIMARY CARE PHYSICIAN: Colford, Delcie Roch, MD    ADMISSION DIAGNOSIS:  SOB (shortness of breath) [R06.02] CAP (community acquired pneumonia) [J18.9] Chronic anemia [D64.9] Elevated brain natriuretic peptide (BNP) level [R79.89] Chronic respiratory failure with hypoxia (HCC) [J96.11] Troponin I above reference range [R77.8] Community acquired pneumonia of left upper lobe of lung [J18.9]  DISCHARGE DIAGNOSIS:  acute on chronic respiratory failure with hypoxia secondary to end-stage COPD and pneumonia 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 Acute respiratory failure with hypoxia and hypercapnia ([place]) 05/01/2016  . Asthma   . COPD (chronic obstructive pulmonary disease) ([place])   . Osteoporosis     HOSPITAL COURSE:  [name] a 70 y.o.Caucasian malewith medical history significant foradvanced COPD with chronic respiratory failure on home O2 at 4 L/min,  with history of necrotizing pneumonia in September of last year, who presented to the emergency room withacute onsetof worsening dyspnea with associated chest tightness, as well as cough productive of yellowish sputum and wheezing which have been worsening since Wednesday.  Acute COPD exacerbation , acute on chronic hypoxic respiratory failure failure of outpatient therapy. 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/Cardiomyopathy -Patient treated outpatient with steroids and Z-Pak with minimal to no improvement and worsening symptoms over the past week -significant abnormal Chest x-ray with chronic findings but no acute findings , procalcitonin less than 0.1,  There is no  Fever, he does not appear septic -Blood  culture no growth -Flu screen negative, COVID-19 screen negative -Urine Legionella antigen negative, urine strep pneumo antigen negative -He received Rocephin and Zithromax initially, abx changed to oral doxycycline to cover for possible bronchitis (last dose on 5/2) -was on IV steroid , then prednisone , last dose today on 4/30 - He appears volume overloaded , improved with iv Lasixx1 on 4/28 and iv lasix x1 on 4/29 , echocardiogram showed EF 25-30% (could not find old echo in chart)--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 Will start po lasix 20 mg qod--pt will need to f/u PCP  -- he will need to f/u pulmonary Dr Lanney Gins. --BNP on admission was 379--pt is back on his 4.5 L/min [place] oxygen. --d/w pt overall prognosis and he understands it is poor. Will resume Home Hospice/Palliative services at discharge  Acute urinary retention RN reports [id number]  on bladder scan on 5/1 In and out cath, start flomax, avoid constipation, increase activity   Impaired fasting blood glucose Likely due to steroid He refused insulin sliding scale Wean steroid to off, last dose on 4/30 A1c 6.0  Hypothyroidism. -Continue home Synthroid  General anxiety disorder -Continue home clonazepam  GERD - stable  FTT: very weak, chronically ill appearing, home with home hospice  He states he does not wants to go to snf. He is in agreement to go home. He is near baseline at present  Pt is at a very high risk for readmission given is chronic medical issues. Will d/c home via EMS today. All questions answered to my best ability.  CONSULTS OBTAINED:    DRUG ALLERGIES:   Allergies  Allergen Reactions  . Augmentin [Amoxicillin-Pot Clavulanate] Rash    Maculopapular rash after receiving several days of amoxicillin/clavulanate  despite tolerating week of ampicillin/sulbactam    DISCHARGE MEDICATIONS:   Allergies as of 02/01/2021      Reactions   Augmentin [amoxicillin-pot  Clavulanate] Rash   Maculopapular rash after receiving several days of amoxicillin/clavulanate despite tolerating week of ampicillin/sulbactam      Medication List    STOP taking these medications   ibuprofen 400 MG tablet Commonly known as: ADVIL   pantoprazole 40 MG tablet Commonly known as: PROTONIX   sucralfate 1 g tablet Commonly known as: CARAFATE   Trelegy Ellipta 100-62.5-25 MCG/INH Aepb Generic drug: Fluticasone-Umeclidin-Vilant     TAKE these medications   albuterol 108 (90 Base) MCG/ACT inhaler Commonly known as: VENTOLIN HFA Inhale 2 puffs into the lungs every 4 (four) hours as needed for wheezing or shortness of breath.   aspirin 81 MG tablet Take 81 mg by mouth daily.   clonazePAM 1 MG tablet Commonly known as: KLONOPIN Take 1 tablet (1 mg total) by mouth 2 (two) times daily.   cyclobenzaprine 10 MG tablet Commonly known as: FLEXERIL Take 10 mg by mouth 3 (three) times daily as needed for muscle spasms.   diphenhydrAMINE 25 mg capsule Commonly known as: BENADRYL Take 25-50 mg by mouth every 6 (six) hours as needed for allergies.   fluticasone 50 MCG/ACT nasal spray Commonly known as: FLONASE Place 2 sprays into both nostrils daily as needed for allergies.   furosemide 20 MG tablet Commonly known as: LASIX Take 1 tablet (20 mg total) by mouth every other day.   guaiFENesin 600 MG 12 hr tablet Commonly known as: MUCINEX Take 1 tablet (600 mg total) by mouth 2 (two) times daily for 7 days.   hydrocerin Crea Apply 1 application topically 2 (two) times daily.   ipratropium 17 MCG/ACT inhaler Commonly known as: ATROVENT HFA Inhale 2 puffs into the lungs every 6 (six) hours.   levothyroxine 25 MCG tablet Commonly known as: SYNTHROID Take 25 mcg by mouth daily before breakfast.   multivitamin with minerals Tabs tablet Take 1 tablet by mouth daily.   nystatin powder Commonly known as: MYCOSTATIN/NYSTOP Apply topically 3 (three) times daily.    Oxycodone HCl 10 MG Tabs Take 1 tablet (10 mg total) by mouth every 6 (six) hours as needed.   polyethylene glycol 17 g packet Commonly known as: MIRALAX / GLYCOLAX Take 17 g by mouth daily as needed for moderate constipation.   Primatene Mist 0.125 MG/ACT Aero Generic drug: EPINEPHrine Inhale 1 Dose into the lungs as needed (allergies).   tamsulosin 0.4 MG Caps capsule Commonly known as: FLOMAX Take 1 capsule (0.4 mg total) by mouth daily. Start taking on: Feb 02, 2021   tiotropium 18 MCG inhalation capsule Commonly known as: SPIRIVA Place 18 mcg into inhaler and inhale daily.       If you experience worsening of your admission symptoms, develop shortness of breath, life threatening emergency, suicidal or homicidal thoughts you must seek medical attention immediately by calling 911 or calling your MD immediately  if symptoms less severe.  You Must read complete instructions/literature along with all the possible adverse reactions/side effects for all the Medicines you take and that have been prescribed to you. Take any new Medicines after you have completely understood and accept all the possible adverse reactions/side effects.   Please note  You were cared for by a hospitalist during your hospital stay. If you have any questions about your discharge medications or the care you received while you were in the hospital after  you are discharged, you can call the unit and asked to speak with the hospitalist on call if the hospitalist that took care of you is not available. Once you are discharged, your primary care physician will handle any further medical issues. Please note that NO REFILLS for any discharge medications will be authorized once you are discharged, as it is imperative that you return to your primary care physician (or establish a relationship with a primary care physician if you do not have one) for your aftercare needs so that they can reassess your need for medications  and monitor your lab values. Today   SUBJECTIVE  Eating BF--ate well. Awake and alert. No resp distress, appears at baseline   VITAL SIGNS:  Blood pressure 111/66, pulse 98, temperature 98 F (36.7 C), resp. rate 20, height 5\' 10"  (1.778 m), weight 99.8 kg, SpO2 97 %.  I/O:    Intake/Output Summary (Last 24 hours) at 02/01/2021 1029 Last data filed at 02/01/2021 0900 Gross per 24 hour  Intake 0 ml  Output 600 ml  Net -600 ml    PHYSICAL EXAMINATION:  General: disheveled,chronically ill-appearing gentleman,  audible wheezing has resolved, respiratory effort now appears normal HEENT:  Normocephalic atraumatic.  Sclerae nonicteric, noninjected.  Extraocular movements intact bilaterally. Neck:  Without mass or deformity.  Trachea is midline. Lungs: No wheezing, improved air entry, occ coarse BS Heart:  Regular rate and rhythm.  Without murmurs, rubs, or gallops. Abdomen:  Soft, nontender, nondistended.  Without guarding or rebound. Extremities: 1+ pitting edema bilateral lower extremity /foot has improved  Vascular:  Dorsalis pedis and posterior tibial pulses palpable bilaterally. Skin:  Warm and dry, blanching erythema bilateral feet left greater than right has improved   DATA REVIEW:   CBC  Recent Labs  Lab 01/31/21 0406  WBC 9.0  HGB 13.0  HCT 43.7  PLT 260    Chemistries  Recent Labs  Lab 01/25/21 1829 01/26/21 0624 01/30/21 0401 01/31/21 0406  NA 141   < > 140 137  K 3.8   < > 3.5 3.8  CL 98   < > 88* 88*  CO2 31   < > 43* 40*  GLUCOSE 128*   < > 131* 132*  BUN 29*   < > 22 22  CREATININE 0.94   < > 0.85 0.67  CALCIUM 9.3   < > 9.1 9.3  MG 2.1   < > 2.3  --   AST 28  --   --   --   ALT 13  --   --   --   ALKPHOS 62  --   --   --   BILITOT 0.7  --   --   --    < > = values in this interval not displayed.    Microbiology Results   Recent Results (from the past 240 hour(s))  Resp Panel by RT-PCR (Flu A&B, Covid) Nasopharyngeal Swab     Status: None    Collection Time: 01/25/21  6:29 PM   Specimen: Nasopharyngeal Swab; Nasopharyngeal(NP) swabs in vial transport medium  Result Value Ref Range Status   SARS Coronavirus 2 by RT PCR NEGATIVE NEGATIVE Final    Comment: (NOTE) SARS-CoV-2 target nucleic acids are NOT DETECTED.  The SARS-CoV-2 RNA is generally detectable in upper respiratory specimens during the acute phase of infection. The lowest concentration of SARS-CoV-2 viral copies this assay can detect is 138 copies/mL. A negative result does not preclude SARS-Cov-2 infection and should  not be used as the sole basis for treatment or other patient management decisions. A negative result may occur with  improper specimen collection/handling, submission of specimen other than nasopharyngeal swab, presence of viral mutation(s) within the areas targeted by this assay, and inadequate number of viral copies(<138 copies/mL). A negative result must be combined with clinical observations, patient history, and epidemiological information. The expected result is Negative.  Fact Sheet for Patients:  EntrepreneurPulse.com.au  Fact Sheet for Healthcare Providers:  IncredibleEmployment.be  This test is no t yet approved or cleared by the [place] FDA and  has been authorized for detection and/or diagnosis of SARS-CoV-2 by FDA under an Emergency Use Authorization (EUA). This EUA will remain  in effect (meaning this test can be used) for the duration of the COVID-19 declaration under Section 564(b)(1) of the Act, 21 U.S.C.section 360bbb-3(b)(1), unless the authorization is terminated  or revoked sooner.       Influenza A by PCR NEGATIVE NEGATIVE Final   Influenza B by PCR NEGATIVE NEGATIVE Final    Comment: (NOTE) The Xpert Xpress SARS-CoV-2/FLU/RSV plus assay is intended as an aid in the diagnosis of influenza from Nasopharyngeal swab specimens and should not be used as a sole basis for treatment.  Nasal washings and aspirates are unacceptable for Xpert Xpress SARS-CoV-2/FLU/RSV testing.  Fact Sheet for Patients: EntrepreneurPulse.com.au  Fact Sheet for Healthcare Providers: IncredibleEmployment.be  This test is not yet approved or cleared by the [place] FDA and has been authorized for detection and/or diagnosis of SARS-CoV-2 by FDA under an Emergency Use Authorization (EUA). This EUA will remain in effect (meaning this test can be used) for the duration of the COVID-19 declaration under Section 564(b)(1) of the Act, 21 U.S.C. section 360bbb-3(b)(1), unless the authorization is terminated or revoked.  Performed at [facility], [address] [place], [place] [address]   Blood culture (routine x 2)     Status: None   Collection Time: 01/25/21  9:37 PM   Specimen: BLOOD  Result Value Ref Range Status   Specimen Description BLOOD LEFT ANTECUBITAL  Final   Special Requests   Final    BOTTLES DRAWN AEROBIC AND ANAEROBIC Blood Culture adequate volume   Culture   Final    NO GROWTH 5 DAYS Performed at [facility], [address], [place], [place] [address]    Report Status 01/30/2021 FINAL  Final  Blood culture (routine x 2)     Status: None   Collection Time: 01/25/21  9:37 PM   Specimen: BLOOD  Result Value Ref Range Status   Specimen Description BLOOD BLOOD RIGHT FOREARM  Final   Special Requests   Final    BOTTLES DRAWN AEROBIC AND ANAEROBIC Blood Culture adequate volume   Culture   Final    NO GROWTH 5 DAYS Performed at [facility], [address], [place], [place] [address]    Report Status 01/30/2021 FINAL  Final    RADIOLOGY:  ECHOCARDIOGRAM COMPLETE  Result Date: 01/31/2021    ECHOCARDIOGRAM REPORT   Patient Name:   ANTWAUN BUTH Date of Exam: 01/31/2021 Medical Rec #:  [id number]        Height:       70.0 in Accession #:    [id number]       Weight:       220.0 lb Date of Birth:   [birth year]         BSA:          2.174  m Patient Age:    70 years         BP:           140/88 mmHg Patient Gender: M                HR:           104 bpm. Exam Location:  [facility] Procedure: 2D Echo and Intracardiac Opacification Agent Indications:    CHF; SOB  History:        Patient has no prior history of Echocardiogram examinations.                 COPD.  Sonographer:    L Thornton-Maynard Referring Phys: [id number] XU Diagnosing      Kate Sable MD Phys:  Sonographer Comments: Suboptimal apical window. Image acquisition challenging due to COPD. IMPRESSIONS  1. Left ventricular ejection fraction, by estimation, is 25 to 30%. The left ventricle has severely decreased function. The left ventricle demonstrates global hypokinesis. Left ventricular diastolic parameters are consistent with Grade I diastolic dysfunction (impaired relaxation).  2. Right ventricular systolic function is low normal. The right ventricular size is normal.  3. The mitral valve is normal in structure. No evidence of mitral valve regurgitation.  4. The aortic valve is tricuspid. Aortic valve regurgitation is not visualized. FINDINGS  Left Ventricle: Left ventricular ejection fraction, by estimation, is 25 to 30%. The left ventricle has severely decreased function. The left ventricle demonstrates global hypokinesis. Definity contrast agent was given IV to delineate the left ventricular endocardial borders. The left ventricular internal cavity size was normal in size. There is no left ventricular hypertrophy. Left ventricular diastolic parameters are consistent with Grade I diastolic dysfunction (impaired relaxation). Right Ventricle: The right ventricular size is normal. No increase in right ventricular wall thickness. Right ventricular systolic function is low normal. Left Atrium: Left atrial size was normal in size. Right Atrium: Right atrial size was normal in size. Pericardium: There is no evidence of pericardial effusion. Mitral Valve:  The mitral valve is normal in structure. No evidence of mitral valve regurgitation. MV peak gradient, 3.5 mmHg. The mean mitral valve gradient is 2.0 mmHg. Tricuspid Valve: The tricuspid valve is normal in structure. Tricuspid valve regurgitation is not demonstrated. Aortic Valve: The aortic valve is tricuspid. Aortic valve regurgitation is not visualized. Aortic valve mean gradient measures 3.0 mmHg. Aortic valve peak gradient measures 4.7 mmHg. Aortic valve area, by VTI measures 2.09 cm. Pulmonic Valve: The pulmonic valve was normal in structure. Pulmonic valve regurgitation is not visualized. Aorta: The aortic root is normal in size and structure. Venous: The inferior vena cava was not well visualized. IAS/Shunts: No atrial level shunt detected by color flow Doppler.  LEFT VENTRICLE PLAX 2D LVIDd:         5.38 cm      Diastology LVIDs:         4.96 cm      LV e' medial:    4.90 cm/s LV PW:         1.02 cm      LV E/e' medial:  12.2 LV IVS:        1.16 cm      LV e' lateral:   5.33 cm/s LVOT diam:     2.40 cm      LV E/e' lateral: 11.2 LV SV:         34 LV SV Index:   16 LVOT Area:     4.52 cm  LV Volumes (MOD) LV vol d, MOD A2C: 177.0 ml LV vol d, MOD A4C: 168.0 ml LV vol s, MOD A2C: 133.0 ml LV vol s, MOD A4C: 112.0 ml LV SV MOD A2C:     44.0 ml LV SV MOD A4C:     168.0 ml LV SV MOD BP:      63.3 ml RIGHT VENTRICLE RV S prime:     13.40 cm/s TAPSE (M-mode): 2.2 cm LEFT ATRIUM             Index LA diam:        4.00 cm 1.84 cm/m LA Vol (A2C):   57.4 ml 26.41 ml/m LA Vol (A4C):   33.8 ml 15.55 ml/m LA Biplane Vol: 43.8 ml 20.15 ml/m  AORTIC VALVE                   PULMONIC VALVE AV Area (Vmax):    1.99 cm    PV Vmax:       0.74 m/s AV Area (Vmean):   1.82 cm    PV Peak grad:  2.2 mmHg AV Area (VTI):     2.09 cm AV Vmax:           108.00 cm/s AV Vmean:          78.700 cm/s AV VTI:            0.163 m AV Peak Grad:      4.7 mmHg AV Mean Grad:      3.0 mmHg LVOT Vmax:         47.60 cm/s LVOT Vmean:         31.700 cm/s LVOT VTI:          0.075 m LVOT/AV VTI ratio: 0.46  AORTA Ao Root diam: 4.30 cm Ao Asc diam:  3.70 cm MITRAL VALVE MV Area VTI:  2.28 cm      SHUNTS MV Peak grad: 3.5 mmHg      Systemic VTI:  0.08 m MV Mean grad: 2.0 mmHg      Systemic Diam: 2.40 cm MV Vmax:      0.93 m/s MV Vmean:     60.5 cm/s MV E velocity: 59.60 cm/s MV A velocity: 101.00 cm/s MV E/A ratio:  0.59 Debbe OdeaBrian Agbor-Etang MD Electronically signed by Debbe OdeaBrian Agbor-Etang MD Signature Date/Time: 01/31/2021/4:39:33 PM    Final      CODE STATUS:     Code Status Orders  (From admission, onward)         Start     Ordered   01/25/21 2058  Do not attempt resuscitation (DNR)  Continuous       Question Answer Comment  In the event of cardiac or respiratory ARREST Do not call a "code blue"   In the event of cardiac or respiratory ARREST Do not perform Intubation, CPR, defibrillation or ACLS   In the event of cardiac or respiratory ARREST Use medication by any route, position, wound care, and other measures to relive pain and suffering. May use oxygen, suction and manual treatment of airway obstruction as needed for comfort.   Comments MOST form on chart.      01/25/21 2057        Code Status History    Date Active Date Inactive Code Status Order ID Comments User Context   01/25/2021 1827 01/25/2021 2057 DNR [id number]  Gilles ChiquitoSmith, Zachary P, MD ED   06/22/2020 1409 07/09/2020 1759 DNR [id number]  Morton StallGriffin, Crystal, NP Inpatient  06/18/2020 2325 06/22/2020 1409 Full Code [id number]  Lenore Cordia, MD ED   01/18/2019 0010 01/18/2019 2118 Full Code [id number]  Lance Coon, MD Inpatient   05/01/2016 0934 05/03/2016 1455 Full Code [id number]  Harrie Foreman, MD Inpatient   Advance Care Planning Activity       TOTAL TIME TAKING CARE OF THIS PATIENT: *40* minutes.    Fritzi Mandes M.D  [facility]    CC: Primary care physician; Colford, Delcie Roch, MD

## 2021-02-01 NOTE — Progress Notes (Signed)
[facility] [address] [facility]) Hospital Liaison RN note:  Spoke with Dayton Scrape, [facility] regarding patient being discharged today. No needs noted. Spoke with patient's sister, Casilda Carls to provide update. She was appreciative of information. [facility] Liaison will notify patient's hospice care team of his discharge so they can provide follow up visit asap. I will fax them his discharge summary as well.   Please call with any hospice related questions or concerns.  Zandra Abts, RN [facility] Liaison  [phone]

## 2021-02-01 NOTE — TOC Transition Note (Signed)
Transition of Care [facility]) - CM/SW Discharge Note   Patient Details  Name: Mark [name] MRN: [id number] Date of Birth: Jul 25, 1951  Transition of Care [facility]) CM/SW Contact:  Candie Chroman, LCSW Phone Number: 02/01/2021, 1:18 PM   Clinical Narrative: Patient has orders to discharge home today. [facility] liaison is aware. EMS transport has been set up and he is 2nd on the list. No further concerns. CSW signing off.    Final next level of care: Home w Hospice Care Barriers to Discharge: Barriers Resolved   Patient Goals and CMS Choice        Discharge Placement                Patient to be transferred to facility by: EMS Name of family member notified: Patient declined. Michela Pitcher his sister is already aware. Patient and family notified of of transfer: 02/01/21  Discharge Plan and Services     Post Acute Care Choice: Resumption of Svcs/PTA Provider                               Social Determinants of Health (SDOH) Interventions     Readmission Risk Interventions No flowsheet data found.

## 2021-02-01 NOTE — Plan of Care (Signed)
  Problem: Education: Goal: Knowledge of General Education information will improve Description: Including pain rating scale, medication(s)/side effects and non-pharmacologic comfort measures 02/01/2021 1330 by Vivien Rota, RN Outcome: Adequate for Discharge 02/01/2021 0925 by Vivien Rota, RN Outcome: Progressing   Problem: Health Behavior/Discharge Planning: Goal: Ability to manage health-related needs will improve 02/01/2021 1330 by Sahana Boyland, Winifred Olive, RN Outcome: Adequate for Discharge 02/01/2021 0925 by Vivien Rota, RN Outcome: Progressing   Problem: Clinical Measurements: Goal: Ability to maintain clinical measurements within normal limits will improve 02/01/2021 1330 by Vivien Rota, RN Outcome: Adequate for Discharge 02/01/2021 0925 by Vivien Rota, RN Outcome: Progressing Goal: Will remain free from infection 02/01/2021 1330 by Vivien Rota, RN Outcome: Adequate for Discharge 02/01/2021 0925 by Vivien Rota, RN Outcome: Progressing Goal: Diagnostic test results will improve 02/01/2021 1330 by Vivien Rota, RN Outcome: Adequate for Discharge 02/01/2021 0925 by Vivien Rota, RN Outcome: Progressing Goal: Respiratory complications will improve 02/01/2021 1330 by Vivien Rota, RN Outcome: Adequate for Discharge 02/01/2021 0925 by Vivien Rota, RN Outcome: Progressing Goal: Cardiovascular complication will be avoided 02/01/2021 1330 by Vivien Rota, RN Outcome: Adequate for Discharge 02/01/2021 0925 by Vivien Rota, RN Outcome: Progressing   Problem: Activity: Goal: Risk for activity intolerance will decrease 02/01/2021 1330 by Vivien Rota, RN Outcome: Adequate for Discharge 02/01/2021 0925 by Vivien Rota, RN Outcome: Progressing   Problem: Nutrition: Goal: Adequate nutrition will be maintained 02/01/2021 1330 by Vivien Rota, RN Outcome: Adequate for  Discharge 02/01/2021 0925 by Vivien Rota, RN Outcome: Progressing   Problem: Coping: Goal: Level of anxiety will decrease 02/01/2021 1330 by Vivien Rota, RN Outcome: Adequate for Discharge 02/01/2021 0925 by Vivien Rota, RN Outcome: Progressing   Problem: Elimination: Goal: Will not experience complications related to bowel motility 02/01/2021 1330 by Vivien Rota, RN Outcome: Adequate for Discharge 02/01/2021 0925 by Vivien Rota, RN Outcome: Progressing Goal: Will not experience complications related to urinary retention 02/01/2021 1330 by Vivien Rota, RN Outcome: Adequate for Discharge 02/01/2021 0925 by Vivien Rota, RN Outcome: Progressing   Problem: Pain Managment: Goal: General experience of comfort will improve 02/01/2021 1330 by Vivien Rota, RN Outcome: Adequate for Discharge 02/01/2021 0925 by Vivien Rota, RN Outcome: Progressing   Problem: Safety: Goal: Ability to remain free from injury will improve 02/01/2021 1330 by Vivien Rota, RN Outcome: Adequate for Discharge 02/01/2021 0925 by Vivien Rota, RN Outcome: Progressing   Problem: Skin Integrity: Goal: Risk for impaired skin integrity will decrease 02/01/2021 1330 by Vivien Rota, RN Outcome: Adequate for Discharge 02/01/2021 0925 by Vivien Rota, RN Outcome: Progressing

## 2021-02-01 NOTE — Plan of Care (Signed)

## 2021-02-01 NOTE — Discharge Instructions (Signed)
Use your oxygen, nebulizer, incentive spirometer as before

## 2021-02-01 NOTE — Care Management Important Message (Signed)
Important Message  Patient Details  Name: Mark [name] MRN: [id number] Date of Birth: 1951/03/06   Medicare Important Message Given:  Other (see comment)  Followed by hospice services.  Medicare IM withheld at this time per Medicare guidelines regarding patients discharging with hospice services.    Dannette Barbara 02/01/2021, 8:46 AM

## 2021-04-02 DEATH — deceased
